# Patient Record
Sex: Male | Born: 1940
Health system: Southern US, Community
[De-identification: ages and names within clinical notes are randomized; demographics above are authoritative.]

## PROBLEM LIST (undated history)

## (undated) DIAGNOSIS — Z950 Presence of cardiac pacemaker: Secondary | ICD-10-CM

## (undated) DIAGNOSIS — I712 Thoracic aortic aneurysm, without rupture, unspecified: Secondary | ICD-10-CM

## (undated) DIAGNOSIS — I10 Essential (primary) hypertension: Secondary | ICD-10-CM

## (undated) DIAGNOSIS — E782 Mixed hyperlipidemia: Secondary | ICD-10-CM

## (undated) DIAGNOSIS — I428 Other cardiomyopathies: Secondary | ICD-10-CM

## (undated) DIAGNOSIS — I4819 Other persistent atrial fibrillation: Secondary | ICD-10-CM

## (undated) DIAGNOSIS — I2510 Atherosclerotic heart disease of native coronary artery without angina pectoris: Secondary | ICD-10-CM

## (undated) DIAGNOSIS — Z95810 Presence of automatic (implantable) cardiac defibrillator: Secondary | ICD-10-CM

## (undated) DIAGNOSIS — C44310 Basal cell carcinoma of skin of unspecified parts of face: Secondary | ICD-10-CM

## (undated) DIAGNOSIS — Q231 Congenital insufficiency of aortic valve: Secondary | ICD-10-CM

## (undated) DIAGNOSIS — I447 Left bundle-branch block, unspecified: Secondary | ICD-10-CM

## (undated) DIAGNOSIS — Q2381 Bicuspid aortic valve: Secondary | ICD-10-CM

## (undated) DIAGNOSIS — N400 Enlarged prostate without lower urinary tract symptoms: Secondary | ICD-10-CM

## (undated) HISTORY — PX: CARDIAC VALVE REPLACEMENT: SHX585

## (undated) HISTORY — PX: HERNIA REPAIR: SHX51

## (undated) HISTORY — DX: Thoracic aortic aneurysm, without rupture, unspecified: I71.20

## (undated) HISTORY — DX: Other cardiomyopathies: I42.8

## (undated) HISTORY — DX: Mixed hyperlipidemia: E78.2

## (undated) HISTORY — DX: Atherosclerotic heart disease of native coronary artery without angina pectoris: I25.10

## (undated) HISTORY — DX: Other persistent atrial fibrillation: I48.19

## (undated) HISTORY — DX: Benign prostatic hyperplasia without lower urinary tract symptoms: N40.0

## (undated) HISTORY — DX: Left bundle-branch block, unspecified: I44.7

## (undated) HISTORY — DX: Essential (primary) hypertension: I10

## (undated) HISTORY — DX: Thoracic aortic aneurysm, without rupture: I71.2

## (undated) HISTORY — DX: Bicuspid aortic valve: Q23.81

## (undated) HISTORY — DX: Congenital insufficiency of aortic valve: Q23.1

---
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
## 2000-02-15 HISTORY — PX: BENTALL PROCEDURE: SHX5058

## 2000-02-15 HISTORY — PX: CARDIAC CATHETERIZATION: SHX172

## 2000-09-06 ENCOUNTER — Encounter: Payer: Self-pay | Admitting: Surgery

## 2000-09-06 ENCOUNTER — Encounter (INDEPENDENT_AMBULATORY_CARE_PROVIDER_SITE_OTHER): Payer: Self-pay | Admitting: *Deleted

## 2000-09-06 ENCOUNTER — Inpatient Hospital Stay (HOSPITAL_COMMUNITY): Admission: RE | Admit: 2000-09-06 | Discharge: 2000-09-18 | Payer: Self-pay | Admitting: Cardiology

## 2000-09-07 ENCOUNTER — Encounter: Payer: Self-pay | Admitting: Surgery

## 2000-09-08 ENCOUNTER — Encounter: Payer: Self-pay | Admitting: Surgery

## 2000-09-09 ENCOUNTER — Encounter: Payer: Self-pay | Admitting: Surgery

## 2000-09-10 ENCOUNTER — Encounter: Payer: Self-pay | Admitting: Surgery

## 2000-09-14 ENCOUNTER — Encounter: Payer: Self-pay | Admitting: Surgery

## 2000-09-14 ENCOUNTER — Encounter: Payer: Self-pay | Admitting: Cardiology

## 2000-10-17 ENCOUNTER — Encounter: Payer: Self-pay | Admitting: Surgery

## 2000-10-17 ENCOUNTER — Encounter: Admission: RE | Admit: 2000-10-17 | Discharge: 2000-10-17 | Payer: Self-pay | Admitting: Surgery

## 2000-10-24 ENCOUNTER — Encounter: Payer: Self-pay | Admitting: Surgery

## 2000-10-24 ENCOUNTER — Encounter: Admission: RE | Admit: 2000-10-24 | Discharge: 2000-10-24 | Payer: Self-pay | Admitting: Surgery

## 2000-10-25 ENCOUNTER — Inpatient Hospital Stay (HOSPITAL_COMMUNITY): Admission: AD | Admit: 2000-10-25 | Discharge: 2000-10-28 | Payer: Self-pay | Admitting: Surgery

## 2000-10-25 ENCOUNTER — Encounter: Payer: Self-pay | Admitting: Surgery

## 2000-10-27 ENCOUNTER — Encounter: Payer: Self-pay | Admitting: Surgery

## 2000-10-28 ENCOUNTER — Encounter: Payer: Self-pay | Admitting: Surgery

## 2000-10-31 ENCOUNTER — Encounter: Payer: Self-pay | Admitting: Surgery

## 2000-10-31 ENCOUNTER — Encounter: Admission: RE | Admit: 2000-10-31 | Discharge: 2000-10-31 | Payer: Self-pay | Admitting: Surgery

## 2000-11-14 ENCOUNTER — Encounter: Payer: Self-pay | Admitting: Surgery

## 2000-11-14 ENCOUNTER — Encounter: Admission: RE | Admit: 2000-11-14 | Discharge: 2000-11-14 | Payer: Self-pay | Admitting: Surgery

## 2003-12-31 ENCOUNTER — Ambulatory Visit: Payer: Self-pay | Admitting: Cardiology

## 2004-02-05 ENCOUNTER — Ambulatory Visit: Payer: Self-pay | Admitting: Cardiology

## 2004-02-18 ENCOUNTER — Ambulatory Visit: Payer: Self-pay | Admitting: Cardiology

## 2004-03-04 ENCOUNTER — Ambulatory Visit: Payer: Self-pay | Admitting: Cardiology

## 2004-04-07 ENCOUNTER — Ambulatory Visit: Payer: Self-pay | Admitting: Cardiology

## 2004-05-05 ENCOUNTER — Ambulatory Visit: Payer: Self-pay | Admitting: Cardiology

## 2004-06-03 ENCOUNTER — Ambulatory Visit: Payer: Self-pay | Admitting: Cardiology

## 2004-07-01 ENCOUNTER — Ambulatory Visit: Payer: Self-pay | Admitting: Cardiology

## 2004-08-02 ENCOUNTER — Ambulatory Visit: Payer: Self-pay | Admitting: Cardiology

## 2004-08-31 ENCOUNTER — Ambulatory Visit: Payer: Self-pay | Admitting: Cardiology

## 2004-09-28 ENCOUNTER — Ambulatory Visit: Payer: Self-pay | Admitting: Cardiology

## 2004-10-07 ENCOUNTER — Ambulatory Visit: Payer: Self-pay | Admitting: Cardiology

## 2004-10-22 ENCOUNTER — Ambulatory Visit: Payer: Self-pay | Admitting: Cardiology

## 2004-11-12 ENCOUNTER — Ambulatory Visit: Payer: Self-pay | Admitting: Cardiology

## 2004-11-19 ENCOUNTER — Ambulatory Visit: Payer: Self-pay | Admitting: Cardiology

## 2004-12-17 ENCOUNTER — Ambulatory Visit: Payer: Self-pay | Admitting: Cardiology

## 2004-12-24 ENCOUNTER — Ambulatory Visit: Payer: Self-pay | Admitting: *Deleted

## 2005-01-12 ENCOUNTER — Ambulatory Visit: Payer: Self-pay | Admitting: Cardiology

## 2005-02-10 ENCOUNTER — Ambulatory Visit: Payer: Self-pay | Admitting: Cardiology

## 2005-03-10 ENCOUNTER — Ambulatory Visit: Payer: Self-pay | Admitting: Cardiology

## 2005-04-01 ENCOUNTER — Ambulatory Visit: Payer: Self-pay | Admitting: Cardiology

## 2005-04-27 ENCOUNTER — Ambulatory Visit: Payer: Self-pay | Admitting: Cardiology

## 2005-05-25 ENCOUNTER — Ambulatory Visit: Payer: Self-pay | Admitting: Cardiology

## 2005-06-22 ENCOUNTER — Ambulatory Visit: Payer: Self-pay | Admitting: Cardiology

## 2005-06-29 ENCOUNTER — Ambulatory Visit: Payer: Self-pay | Admitting: Cardiology

## 2005-07-13 ENCOUNTER — Ambulatory Visit: Payer: Self-pay | Admitting: Cardiology

## 2005-08-10 ENCOUNTER — Ambulatory Visit: Payer: Self-pay | Admitting: Cardiology

## 2005-08-30 ENCOUNTER — Ambulatory Visit: Payer: Self-pay | Admitting: Cardiology

## 2005-09-27 ENCOUNTER — Ambulatory Visit: Payer: Self-pay | Admitting: Cardiology

## 2005-10-25 ENCOUNTER — Ambulatory Visit: Payer: Self-pay | Admitting: Cardiology

## 2005-11-15 ENCOUNTER — Ambulatory Visit: Payer: Self-pay | Admitting: Cardiology

## 2005-12-26 ENCOUNTER — Ambulatory Visit: Payer: Self-pay | Admitting: Cardiology

## 2006-01-02 ENCOUNTER — Ambulatory Visit: Payer: Self-pay | Admitting: Cardiology

## 2006-01-12 ENCOUNTER — Ambulatory Visit: Payer: Self-pay | Admitting: Cardiology

## 2006-01-26 ENCOUNTER — Ambulatory Visit: Payer: Self-pay | Admitting: Cardiology

## 2006-02-16 ENCOUNTER — Ambulatory Visit: Payer: Self-pay | Admitting: Cardiology

## 2006-03-07 ENCOUNTER — Ambulatory Visit: Payer: Self-pay | Admitting: Cardiology

## 2006-03-28 ENCOUNTER — Ambulatory Visit: Payer: Self-pay | Admitting: Cardiology

## 2006-05-02 ENCOUNTER — Ambulatory Visit: Payer: Self-pay | Admitting: Cardiology

## 2006-06-14 ENCOUNTER — Ambulatory Visit: Payer: Self-pay | Admitting: Cardiology

## 2006-07-12 ENCOUNTER — Ambulatory Visit: Payer: Self-pay | Admitting: Physician Assistant

## 2006-08-09 ENCOUNTER — Ambulatory Visit: Payer: Self-pay | Admitting: Cardiology

## 2006-09-07 ENCOUNTER — Ambulatory Visit: Payer: Self-pay | Admitting: Cardiology

## 2006-09-21 ENCOUNTER — Ambulatory Visit: Payer: Self-pay | Admitting: Cardiology

## 2006-09-29 ENCOUNTER — Encounter: Payer: Self-pay | Admitting: Cardiology

## 2006-09-29 ENCOUNTER — Ambulatory Visit: Payer: Self-pay | Admitting: Cardiology

## 2006-10-05 ENCOUNTER — Ambulatory Visit: Payer: Self-pay | Admitting: Cardiology

## 2006-10-25 ENCOUNTER — Ambulatory Visit: Payer: Self-pay | Admitting: Cardiology

## 2006-10-25 ENCOUNTER — Encounter: Payer: Self-pay | Admitting: Cardiology

## 2006-11-02 ENCOUNTER — Ambulatory Visit: Payer: Self-pay | Admitting: Cardiology

## 2006-12-07 ENCOUNTER — Ambulatory Visit: Payer: Self-pay | Admitting: Cardiology

## 2007-01-04 ENCOUNTER — Ambulatory Visit: Payer: Self-pay | Admitting: Cardiology

## 2007-01-08 ENCOUNTER — Encounter: Payer: Self-pay | Admitting: Cardiology

## 2007-01-08 ENCOUNTER — Ambulatory Visit: Payer: Self-pay | Admitting: Cardiology

## 2007-01-10 ENCOUNTER — Ambulatory Visit: Payer: Self-pay | Admitting: Cardiology

## 2007-01-16 ENCOUNTER — Ambulatory Visit: Payer: Self-pay | Admitting: Cardiology

## 2007-01-18 ENCOUNTER — Ambulatory Visit: Payer: Self-pay | Admitting: Cardiology

## 2007-01-30 ENCOUNTER — Ambulatory Visit: Payer: Self-pay | Admitting: Cardiology

## 2007-02-20 ENCOUNTER — Ambulatory Visit: Payer: Self-pay | Admitting: Cardiology

## 2007-03-20 ENCOUNTER — Ambulatory Visit: Payer: Self-pay | Admitting: Cardiology

## 2007-04-25 ENCOUNTER — Ambulatory Visit: Payer: Self-pay | Admitting: Cardiology

## 2007-05-01 ENCOUNTER — Encounter: Payer: Self-pay | Admitting: Cardiology

## 2007-05-23 ENCOUNTER — Ambulatory Visit: Payer: Self-pay | Admitting: Cardiology

## 2007-06-20 ENCOUNTER — Ambulatory Visit: Payer: Self-pay | Admitting: Cardiology

## 2007-07-19 ENCOUNTER — Ambulatory Visit: Payer: Self-pay | Admitting: Cardiology

## 2007-08-15 ENCOUNTER — Ambulatory Visit: Payer: Self-pay | Admitting: Cardiology

## 2007-09-04 ENCOUNTER — Ambulatory Visit: Payer: Self-pay | Admitting: Cardiology

## 2007-09-12 ENCOUNTER — Ambulatory Visit: Payer: Self-pay | Admitting: Cardiology

## 2007-10-08 ENCOUNTER — Ambulatory Visit: Payer: Self-pay | Admitting: Cardiology

## 2007-11-20 ENCOUNTER — Ambulatory Visit: Payer: Self-pay | Admitting: Cardiology

## 2007-12-04 ENCOUNTER — Ambulatory Visit: Payer: Self-pay | Admitting: Cardiology

## 2007-12-25 ENCOUNTER — Ambulatory Visit: Payer: Self-pay | Admitting: Cardiology

## 2008-01-04 ENCOUNTER — Ambulatory Visit: Payer: Self-pay | Admitting: Cardiology

## 2008-01-08 ENCOUNTER — Encounter: Payer: Self-pay | Admitting: Cardiology

## 2008-01-25 ENCOUNTER — Ambulatory Visit: Payer: Self-pay | Admitting: Cardiology

## 2008-02-22 ENCOUNTER — Ambulatory Visit: Payer: Self-pay | Admitting: Cardiology

## 2008-03-26 ENCOUNTER — Ambulatory Visit: Payer: Self-pay | Admitting: Cardiology

## 2008-04-02 ENCOUNTER — Ambulatory Visit: Payer: Self-pay | Admitting: Cardiology

## 2008-04-02 DIAGNOSIS — E782 Mixed hyperlipidemia: Secondary | ICD-10-CM | POA: Insufficient documentation

## 2008-04-02 DIAGNOSIS — I4891 Unspecified atrial fibrillation: Secondary | ICD-10-CM | POA: Insufficient documentation

## 2008-04-04 ENCOUNTER — Encounter: Payer: Self-pay | Admitting: Cardiology

## 2008-04-22 ENCOUNTER — Ambulatory Visit: Payer: Self-pay | Admitting: Cardiology

## 2008-05-13 ENCOUNTER — Ambulatory Visit: Payer: Self-pay | Admitting: Cardiology

## 2008-06-10 ENCOUNTER — Ambulatory Visit: Payer: Self-pay | Admitting: Cardiology

## 2008-07-08 ENCOUNTER — Ambulatory Visit: Payer: Self-pay | Admitting: Cardiology

## 2008-07-11 ENCOUNTER — Encounter: Payer: Self-pay | Admitting: Cardiology

## 2008-08-12 ENCOUNTER — Ambulatory Visit: Payer: Self-pay | Admitting: Cardiology

## 2008-09-09 ENCOUNTER — Ambulatory Visit: Payer: Self-pay | Admitting: Cardiology

## 2008-09-10 ENCOUNTER — Telehealth: Payer: Self-pay | Admitting: Cardiology

## 2008-09-29 ENCOUNTER — Encounter: Payer: Self-pay | Admitting: *Deleted

## 2008-10-03 ENCOUNTER — Encounter (INDEPENDENT_AMBULATORY_CARE_PROVIDER_SITE_OTHER): Payer: Self-pay | Admitting: *Deleted

## 2008-10-07 ENCOUNTER — Ambulatory Visit: Payer: Self-pay | Admitting: Cardiology

## 2008-10-07 LAB — CONVERTED CEMR LAB
POC INR: 2.8
Prothrombin Time: 20.4 s

## 2008-10-08 ENCOUNTER — Ambulatory Visit: Payer: Self-pay | Admitting: Cardiology

## 2008-10-08 DIAGNOSIS — I10 Essential (primary) hypertension: Secondary | ICD-10-CM

## 2008-10-08 DIAGNOSIS — R002 Palpitations: Secondary | ICD-10-CM

## 2008-10-10 ENCOUNTER — Encounter: Payer: Self-pay | Admitting: Cardiology

## 2008-10-16 ENCOUNTER — Encounter: Payer: Self-pay | Admitting: Physician Assistant

## 2008-10-17 ENCOUNTER — Ambulatory Visit: Payer: Self-pay | Admitting: Cardiology

## 2008-10-17 ENCOUNTER — Encounter: Payer: Self-pay | Admitting: Cardiology

## 2008-11-04 ENCOUNTER — Ambulatory Visit: Payer: Self-pay | Admitting: Cardiology

## 2008-11-04 LAB — CONVERTED CEMR LAB: POC INR: 4.8

## 2008-11-05 ENCOUNTER — Encounter: Payer: Self-pay | Admitting: Cardiology

## 2008-11-12 ENCOUNTER — Encounter (INDEPENDENT_AMBULATORY_CARE_PROVIDER_SITE_OTHER): Payer: Self-pay | Admitting: *Deleted

## 2008-11-28 ENCOUNTER — Encounter: Payer: Self-pay | Admitting: Cardiology

## 2009-01-02 ENCOUNTER — Ambulatory Visit: Payer: Self-pay | Admitting: Cardiology

## 2009-01-02 DIAGNOSIS — I428 Other cardiomyopathies: Secondary | ICD-10-CM

## 2009-01-02 DIAGNOSIS — I2510 Atherosclerotic heart disease of native coronary artery without angina pectoris: Secondary | ICD-10-CM

## 2009-01-14 ENCOUNTER — Ambulatory Visit: Payer: Self-pay | Admitting: Cardiology

## 2009-01-23 ENCOUNTER — Ambulatory Visit: Payer: Self-pay | Admitting: Internal Medicine

## 2009-03-05 ENCOUNTER — Ambulatory Visit: Payer: Self-pay | Admitting: Cardiology

## 2009-06-17 ENCOUNTER — Encounter: Payer: Self-pay | Admitting: Cardiology

## 2009-06-17 ENCOUNTER — Telehealth: Payer: Self-pay | Admitting: Cardiology

## 2009-07-08 ENCOUNTER — Ambulatory Visit: Payer: Self-pay | Admitting: Internal Medicine

## 2009-07-14 ENCOUNTER — Telehealth (INDEPENDENT_AMBULATORY_CARE_PROVIDER_SITE_OTHER): Payer: Self-pay | Admitting: *Deleted

## 2009-07-21 ENCOUNTER — Telehealth (INDEPENDENT_AMBULATORY_CARE_PROVIDER_SITE_OTHER): Payer: Self-pay | Admitting: *Deleted

## 2009-09-14 ENCOUNTER — Ambulatory Visit: Payer: Self-pay | Admitting: Cardiology

## 2010-01-01 ENCOUNTER — Telehealth: Payer: Self-pay | Admitting: Cardiology

## 2010-03-14 LAB — CONVERTED CEMR LAB
ALT: 27 units/L (ref 0–53)
AST: 36 units/L (ref 0–37)
Albumin: 4.3 g/dL (ref 3.5–5.2)
Alkaline Phosphatase: 57 units/L (ref 39–117)
Bilirubin, Direct: 0.2 mg/dL (ref 0.0–0.3)
TSH: 1.89 microintl units/mL (ref 0.35–5.50)
Total Bilirubin: 0.8 mg/dL (ref 0.3–1.2)
Total Protein: 6.5 g/dL (ref 6.0–8.3)

## 2010-03-16 NOTE — Letter (Signed)
Summary: Letter/ CLEARANCE TO STOP COUMADIN [facility]  Letter/ CLEARANCE TO STOP COUMADIN [facility]   Imported By: Dorise Hiss 06/18/2009 08:48:40  _____________________________________________________________________  External Attachment:    Type:   Image     Comment:   External Document

## 2010-03-16 NOTE — Progress Notes (Signed)
Summary: Coumadin for Prostate Biopsy  Phone Note Call from Patient Call back at Home Phone [phone] Call back at [phone]   Summary of Call: Pt called stating he is having a prostate biopsy on 12/19. He would like to know if he can hold his Coumadin for 5 days prior to biopsy. He states surgeon's staff told him that he should call for clearance regarding Coumadin. Pt's Coumadin is managed by his primary MD.  Initial call taken by: Cyril Loosen, RN, BSN,  January 01, 2010 10:10 AM  Follow-up for Phone Call        Patient held Coumadin prior to a previous prostate biopsy back in May of this year. No other bridging was pursued at that time. He did in fact have some hematuria after the biopsy, delaying resumption of Coumadin. If he needs to undergo another prostate biopsy, then would hold Coumadin 4-5 days prior to proceeding without bridging Lovenox. Dr. Sherryll Burger has actually been following PT/INR levels. Would also make him aware of these recommendations so that he can arrange appropriate followup when Coumadin was resumed. Follow-up by: Loreli Slot, MD, [facility],  January 05, 2010 9:39 AM     Appended Document: Coumadin for Prostate Biopsy Pt notified and verbalized understanding. Copy of this note will be faxed to Dr. Gaynelle Arabian at [facility] [place]. Their phone number is [phone]. Fax is [phone]. Also a copy will be faxed to Dr. Sherryll Burger.

## 2010-03-16 NOTE — Assessment & Plan Note (Signed)
Summary: 6 mo fu july reminder   Visit Type:  Follow-up Primary Provider:  Dr. [name]   History of Present Illness: 70 year old male presents for followup. He reports continued problems with palpitations. He is not entirely clear about his amiodarone dose, however it seems to be 100 mg daily as best I can tell. Other medicines are outlined below.  He saw Dr. Johney Frame back in May for discussion of therapeutic options for management of atrial fibrillation. Amiodarone was continued at that time. Followup labs from 25 May showed TSH 1.8, AST 36, ALT 27.  No chest pain or unusual breathlessness. No cough or hemoptysis.  Electrocardiogram is reviewed below.  Preventive Screening-Counseling & Management  Alcohol-Tobacco     Smoking Status: quit     Year Quit: 2003  Current Medications (verified): 1)  Amiodarone Hcl 200 Mg Tabs (Amiodarone Hcl) .... Take 1/2 Tablet By Mouth Once Daily. 2)  Lipitor 20 Mg Tabs (Atorvastatin Calcium) .... Take 1 Tablet By Mouth At Bedtime 3)  Toprol Xl 50 Mg Xr24h-Tab (Metoprolol Succinate) .... Take 1 1/2 Tablet By Mouth Twice A Day 4)  Warfarin Sodium 5 Mg Tabs (Warfarin Sodium) .... Take 1 Tablet By Mouth As Directed 5)  Multivitamins   Tabs (Multiple Vitamin) .... Once Daily 6)  Fish Oil 1000 Mg Caps (Omega-3 Fatty Acids) .... Take 1 Tablet By Mouth Two Times A Day 7)  Flomax 0.4 Mg Caps (Tamsulosin Hcl) .Marland Kitchen.. 1 Capsule Once Daily  Allergies (verified): No Known Drug Allergies  Comments:  Nurse/Medical Assistant: The patient's medications and allergies were verbally reviewed with the patient and were updated in the Medication and Allergy Lists.  Past History:  Past Medical History: Last updated: 07/08/2009 Paroyxsmal Atrial Fibrillation and atrial flutter CAD, nonobstructive by cardiac catheterization in 2002. Nonischemic CM (EF 45%) Hyperlipidemia Hypertension Left bundle-branch block BPH Bicuspid aortic valve with fusiform ascending  aortic aneurysm, status post Bentall procedure 2002  Past Surgical History: Last updated: 04/02/2008 Bentall procedure 2002 Tonsillectomy  Social History: Last updated: 01/23/2009 Widowed.  Lives in [place], [place].  Nutritional therapist. Tobacco Use - Former.  Quit 2002 Alcohol Use - rare Regular Exercise - yes  Review of Systems  The patient denies chest pain, syncope, dyspnea on exertion, peripheral edema, prolonged cough, melena, and hematochezia.         Otherwise reviewed and negative.  Vital Signs:  Patient profile:   70 year old male Height:      68 inches Weight:      188 pounds Pulse rate:   84 / minute BP sitting:   151 / 82  (left arm) Cuff size:   regular  Vitals Entered By: Carlye Grippe (September 14, 2009 3:06 PM)  Serial Vital Signs/Assessments:  Time      Position  BP       Pulse  Resp  Temp     By 3:10 PM             143/76   85                    Carlye Grippe  Comments: 3:10 PM recheck left arm/ reg cuff By: Carlye Grippe     Physical Exam  Additional Exam:  Overweight male in no acute distress. HEENT: Conjunctiva and lids normal, oropharynx clear. Neck: Supple, no carotid bruits, no thyromegaly. Lungs: Clear to auscultation, nonlabored. Cardiac: irregularly irregular, crisp prosthetic mechanical aortic valve click, no diastolic murmur, no S3 gallop. Abdomen: Soft,  nontender, no hepatomegaly, no bruits. Extremities: No significant pitting edema, distal pulses are 2+. Skin: Warm and dry. Musculoskeletal: No kyphosis. Neuropsychiatric: Alert oriented x3, appropriate.   CXR  Procedure date:  10/17/2008  Findings:      Cardiac size within normal limits. Sternal wires noted. Valvular prosthesis noted. Mild redistribution of pulmonary blood flow to upper lung zones.  PFT  Procedure date:  10/17/2008  Findings:      FVC 79% predicted. FEV1 93% predicted. FEV1/FVC 81% predicted. DLCO 80% predicted.  Echocardiogram  Procedure date:   10/17/2008  Findings:      LVEF 40-45%, paradoxical septal motion, mild to moderate left atrial enlargement, mild right atrial enlargement, mechanical aortic prosthesis, mean aortic valve gradient 14 mm mercury, mild to moderate mitral regurgitation, trace tricuspid regurgitation, RVSP 39 mm mercury.  Nuclear Study  Procedure date:  01/14/2009  Findings:      1. Lexiscan bolus per protocol. The patient experienced no chest pain. Nondiagnostic secondary to LBBB. Normal blood pressure response to stress. 2. Abnormal LV perfusion. Limitations and artifact were due to diaphragm or other soft tissue. There was a small, fixed defect in the mid anterior, mid anteroseptal segment(s). The degree of photon reduction was mild. There was a small, fixed defect in the basal inferior, mid inferoseptal segment(s). The degree of photon reduction was mild. No clearly reversible defects to suggest ischemia. Global left ventricular systolic function was moderately reduced. The patient's calculated post stress LVEF was 40%. The patient's end diastolic volume was . TID Ratio 1.  EKG  Procedure date:  09/14/2009  Findings:      Atrial fibrillation at 80 beats per minute with left bundle branch block pattern.  Impression & Recommendations:  Problem # 1:  ATRIAL FIBRILLATION (ICD-427.31)  Paroxysmal, persists today. Amiodarone will be increased to 200 mg daily. Otherwise continue present medications. May need to back down on Toprol-XL if bradycardia becomes more problematic. Dr. Jenel Lucks note was reviewed regarding other potential options for management of his dysrhythmia.  His updated medication list for this problem includes:    Amiodarone Hcl 200 Mg Tabs (Amiodarone hcl) .Marland Kitchen... Take 1 tablet by mouth once a day    Toprol Xl 50 Mg Xr24h-tab (Metoprolol succinate) .Marland Kitchen... Take 1 1/2 tablet by mouth twice a day    Warfarin Sodium 5 Mg Tabs (Warfarin sodium) .Marland Kitchen... Take 1 tablet by mouth as  directed  Orders: EKG w/ Interpretation (93000)  Problem # 2:  UNSPECIFIED SECONDARY CARDIOMYOPATHY (ICD-425.9)  Nonischemic cardiomyopathy, LVEF approximately 45%. No active heart failure symptoms.  His updated medication list for this problem includes:    Amiodarone Hcl 200 Mg Tabs (Amiodarone hcl) .Marland Kitchen... Take 1 tablet by mouth once a day    Toprol Xl 50 Mg Xr24h-tab (Metoprolol succinate) .Marland Kitchen... Take 1 1/2 tablet by mouth twice a day    Warfarin Sodium 5 Mg Tabs (Warfarin sodium) .Marland Kitchen... Take 1 tablet by mouth as directed  Problem # 3:  CORONARY ATHEROSCLEROSIS NATIVE CORONARY ARTERY (ICD-414.01)  Nonobstructive at catheterization in 2002 with no definite ischemia by followup Cardiolite in December 2010.  His updated medication list for this problem includes:    Toprol Xl 50 Mg Xr24h-tab (Metoprolol succinate) .Marland Kitchen... Take 1 1/2 tablet by mouth twice a day    Warfarin Sodium 5 Mg Tabs (Warfarin sodium) .Marland Kitchen... Take 1 tablet by mouth as directed  Orders: EKG w/ Interpretation (93000)  Patient Instructions: 1)  Your physician wants you to follow-up in: 6 months. You will  receive a reminder letter in the mail one-two months in advance. If you don't receive a letter, please call our office to schedule the follow-up appointment. 2)  Take Amiodarone 200mg  by mouth once daily. Prescriptions: AMIODARONE HCL 200 MG TABS (AMIODARONE HCL) Take 1 tablet by mouth once a day  #90 x 3   Entered by:   Cyril Loosen, RN, BSN   Authorized by:   Loreli Slot, MD, [facility]   Signed by:   Cyril Loosen, RN, BSN on 09/14/2009   Method used:   Electronically to        [facility] (retail)       [address] [place], [place]  [address]       Ph: [phone]       Fax: [phone]   RxID:   [id number]

## 2010-03-16 NOTE — Progress Notes (Signed)
Summary: Questions regarding post-op bleeding and restarting coumadin  Phone Note Call from Patient Call back at Home Phone [phone] Call back at [phone]   Summary of Call: Pt left message to call stating he has something going on that he feels Dr. Diona Browner would want to know about because it could be life threatening.   Pt states he had biopsy on prostate last Friday and was told he would have some bleeding in his urine afterwards. He was off coumadin for 5 days before biopsy. He stopped this on May 28th. He decided to take Coumadin on June 5th (Sunday) because he was concerned that he had been off of this for so long. Dr. Earlene Plater did not tell him when to restart Coumadin following surgery. He states he started bleeding more after taking 1 5mg  tablet on Sunday. He saw Dr. Earlene Plater yesterday and was told the bleeding is normal. Pt wants to know what could happen to him while he's off of Coumadin. He states his heart feels like it is beating irratic. He wants to know if there is something that can stop the bleeding sooner. He also has not been told when to restart the Coumadin. Pt's coumadin is managed by Dr. Sherryll Burger.   Pt notified that Dr. Earlene Plater needs to weigh the risk of bleeding with the risk of being off of the Coumadin and make a decision when the pt should resume Coumadin. Pt asked several times when he should restart Coumadin. Pt notified that Dr. Earlene Plater preformed his biopsy and is treating his post-op bleeding, therefore, he must make the decision when the pt should restart his Coumadin. Pt notified to we would like Coumadin restarted ASAP, but again, this will ultimately be Dr. Jinny Sanders decision since he is treating his bleeding. Notified pt to contact Dr. Earlene Plater to ask specifically when he should restart his coumadin. Pt notified he should see Dr. Sherryll Burger 3-4 days after restarting the Coumadin to for PT/INR check. Pt verbalized understanding.  Initial call taken by: Cyril Loosen, RN, BSN,  July 21, 2009 10:00 AM  Follow-up for Phone Call        Reviewed.  He should stay in close contact with Dr. Earlene Plater regarding the bleeding and appropriate timing for resuming Coumadin.  Once Coumadin is resumed, he should have a PT/INR in 3 days with Dr.Shah. Follow-up by: Loreli Slot, MD, [facility],  July 21, 2009 11:31 AM  Additional Follow-up for Phone Call Additional follow up Details #1::        Pt notified that Dr. Diona Browner had reviewed the nurse's note and agreed with recommendation to follow with Dr. Earlene Plater and have PT/INR check with Dr. Sherryll Burger following restart of coumadin. Additional Follow-up by: Cyril Loosen, RN, BSN,  July 21, 2009 2:24 PM

## 2010-03-16 NOTE — Progress Notes (Signed)
       Additional Follow-up for Phone Call Additional follow up Details #2::    Received communication from Rema Fendt at [facility] regarding request for limited cessation of Coumadin (5 days) for a prostate biopsy secondary to elevated PSA. Patient has paroxysmal atrial fibrillation and flutter - last in sinus rhythm at office visit earlier this year. He should be able to hold Coumadin for 5 days as requested without clear need for Lovenox bridge (may only serve to increase his bleeding risk).  Should start back on Coumadin after biopsy with plan to have PT/INR checked 4 days after start. Follow-up by: Loreli Slot, MD, [facility],  Jun 17, 2009 4:22 PM   Appended Document:  Faxed to [facility]

## 2010-03-16 NOTE — Assessment & Plan Note (Signed)
Summary: 2 MO FU REMINDER   Visit Type:  Follow-up Primary Provider:  Dr. [name]   History of Present Illness: 70 year old male presents for a followup visit. He saw Dr. Johney Frame in electrophysiology consultation as scheduled. They discussed a number of options, and at this point elected to continue the same course, including amiodarone. He remains concerned about staying on this medicine long-term, and we discussed reducing the dose to 100 mg daily, realizing that he may have less optimal control of breakthrough arrhythmias, and we may need to consider switching him to a different antiarrhythmic altogether.  Patient has had less palpitations in general, no chest pain, no progressive shortness of breath, and continues to exercise regularly. He has had no bleeding problems.  Current Medications (verified): 1)  Amiodarone Hcl 200 Mg Tabs (Amiodarone Hcl) .... Take 1/2 Tablet By Mouth Once Daily. 2)  Lipitor 20 Mg Tabs (Atorvastatin Calcium) .... Take 1 Tablet By Mouth At Bedtime 3)  Toprol Xl 50 Mg Xr24h-Tab (Metoprolol Succinate) .... Take 1 1/2 Tablet By Mouth Twice A Day 4)  Warfarin Sodium 5 Mg Tabs (Warfarin Sodium) .... Take 1 Tablet By Mouth As Directed 5)  Multivitamins   Tabs (Multiple Vitamin) .... Once Daily 6)  Fish Oil 1000 Mg Caps (Omega-3 Fatty Acids) .... Take 1 Tablet By Mouth Two Times A Day  Allergies (verified): No Known Drug Allergies  Comments:  Nurse/Medical Assistant: The patient's medications and allergies were reviewed with the patient and were updated in the Medication and Allergy Lists. Verbally gave names.  Past History:  Past Surgical History: Last updated: 04/02/2008 Bentall procedure 2002 Tonsillectomy  Social History: Last updated: 01/23/2009 Widowed.  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 Hyperlipidemia Hypertension Left bundle-branch block BPH Bicuspid aortic valve with fusiform ascending aortic aneurysm, status post Bentall procedure 2002  Review of Systems  The patient denies anorexia, fever, weight loss, chest pain, syncope, peripheral edema, prolonged cough, hemoptysis, melena, and hematochezia.         Otherwise reviewed and negative.  Vital Signs:  Patient profile:   70 year old male Height:      68 inches Weight:      194 pounds Pulse rate:   52 / minute BP sitting:   138 / 73  (left arm) Cuff size:   regular  Vitals Entered By: Carlye Grippe (March 05, 2009 1:45 PM)  Physical Exam  Additional Exam:  Overweight male in no acute distress. HEENT: Conjunctiva and lids normal, oropharynx clear. Neck: Supple, no carotid bruits, no thyromegaly. Lungs: Clear to auscultation, nonlabored. Cardiac: Regular rate and rhythm, crisp prosthetic mechanical aortic valve click, no diastolic murmur, no S3 gallop. Abdomen: Soft, nontender, no hepatomegaly, no bruits. Extremities: No significant pitting edema, distal pulses are 2+. Skin: Warm and dry. Musculoskeletal: No kyphosis. Neuropsychiatric: Alert oriented x3, appropriate.   EKG  Procedure date:  03/05/2009  Findings:      Sinus bradycardia at 51 beats per minutes with left bundle branch block.  Impression & Recommendations:  Problem # 1:  ATRIAL FIBRILLATION (ICD-427.31)  Patient in sinus rhythm today, with less frequent palpitations in general. Plan will be to reduce amiodarone from 200 mg daily to 100 mg daily, and have him follow up with the next 6 months with repeat liver function tests and  TSH. If he has progressive breakthrough atrial dysrhythmias, we will need to consider a different antiarrhythmic. He does have somewhat of an element of tachycardia-bradycardia syndrome, and device therapy is also a potential option down the  road.  His updated medication list for this problem includes:    Amiodarone Hcl 200 Mg Tabs (Amiodarone hcl) .Marland Kitchen... Take 1/2 tablet by mouth once daily.    Toprol Xl 50 Mg Xr24h-tab (Metoprolol succinate) .Marland Kitchen... Take 1 1/2 tablet by mouth twice a day    Warfarin Sodium 5 Mg Tabs (Warfarin sodium) .Marland Kitchen... Take 1 tablet by mouth as directed  Orders: EKG w/ Interpretation (93000)  Problem # 2:  UNSPECIFIED SECONDARY CARDIOMYOPATHY (ICD-425.9)  Symptomatically stable on present medical regimen.  His updated medication list for this problem includes:    Amiodarone Hcl 200 Mg Tabs (Amiodarone hcl) .Marland Kitchen... Take 1/2 tablet by mouth once daily.    Toprol Xl 50 Mg Xr24h-tab (Metoprolol succinate) .Marland Kitchen... Take 1 1/2 tablet by mouth twice a day    Warfarin Sodium 5 Mg Tabs (Warfarin sodium) .Marland Kitchen... Take 1 tablet by mouth as directed  Problem # 3:  MIXED HYPERLIPIDEMIA (ICD-272.2)  Plan lipid profile prior to next visit.  His updated medication list for this problem includes:    Lipitor 20 Mg Tabs (Atorvastatin calcium) .Marland Kitchen... Take 1 tablet by mouth at bedtime  Patient Instructions: 1)  Decrease Amiodarone to 100mg  by mouth once daily. This is 1/2 of your 200mg  tablets.  2)  Your physician recommends that you go to the [facility] for a FASTING lipid profile, TSH, and liver function labs:  before your next appt in 6 months. Do not eat or drink after midnight.  3)  Your physician wants you to follow-up in: 6 months. You will receive a reminder letter in the mail one-two months in advance. If you don't receive a letter, please call our office to schedule the follow-up appointment.

## 2010-03-16 NOTE — Assessment & Plan Note (Signed)
Summary: rov/jss   Visit Type:  Follow-up Referring Provider:  Simona Huh, MD Primary Provider:  Dr. [name]   History of Present Illness: The patient presents today for routine electrophysiology followup. He reports doing very well since last being seen in our clinic.  He is now walking  ~ 1 mile most days.  He feels that his energy and exercise tolerance are stable.  He reports occasional episodes of afib.  He tried decreasing amiodarone to 100mg  daily but noticed increasing afib with this dose.  The patient denies symptoms of chest pain, shortness of breath, orthopnea, PND, lower extremity edema, dizziness, presyncope, syncope, or neurologic sequela. The patient is tolerating medications without difficulties and is otherwise without complaint today.   Current Medications (verified): 1)  Amiodarone Hcl 200 Mg Tabs (Amiodarone Hcl) .... Take 1 Tablet By Mouth Once Daily. 2)  Lipitor 20 Mg Tabs (Atorvastatin Calcium) .... Take 1 Tablet By Mouth At Bedtime 3)  Toprol Xl 50 Mg Xr24h-Tab (Metoprolol Succinate) .... Take 1 1/2 Tablet By Mouth Twice A Day 4)  Warfarin Sodium 5 Mg Tabs (Warfarin Sodium) .... Take 1 Tablet By Mouth As Directed 5)  Multivitamins   Tabs (Multiple Vitamin) .... Once Daily 6)  Fish Oil 1000 Mg Caps (Omega-3 Fatty Acids) .... Take 1 Tablet By Mouth Two Times A Day 7)  Flomax 0.4 Mg Caps (Tamsulosin Hcl) .Marland Kitchen.. 1 Capsule Once Daily  Allergies (verified): No Known Drug Allergies  Past History:  Past Medical History: Paroyxsmal Atrial Fibrillation and atrial flutter CAD, nonobstructive by cardiac catheterization in 2002. Nonischemic CM (EF 45%) Hyperlipidemia Hypertension Left bundle-branch block BPH Bicuspid aortic valve with fusiform ascending aortic aneurysm, status post Bentall procedure 2002  Past Surgical History: Reviewed history from 04/02/2008 and no changes required. Bentall procedure 2002 Tonsillectomy  Social History: Reviewed history from  01/23/2009 and no changes required. Widowed.  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   Vital Signs:  Patient profile:   70 year old male Height:      68 inches Weight:      193 pounds BMI:     29.45 Pulse rate:   50 / minute BP sitting:   110 / 80  (left arm)  Vitals Entered By: Laurance Flatten CMA (Jul 08, 2009 3:09 PM)  Physical Exam  General:  Well developed, well nourished, in no acute distress. Head:  normocephalic and atraumatic Nose:  no deformity, discharge, inflammation, or lesions Mouth:  Teeth, gums and palate normal. Oral mucosa normal. Neck:  Neck supple, no JVD. No masses, thyromegaly or abnormal cervical nodes. Lungs:  Clear bilaterally to auscultation and percussion. Heart:  RRR, mechanical A2 Abdomen:  Bowel sounds positive; abdomen soft and non-tender without masses, organomegaly, or hernias noted. No hepatosplenomegaly. Msk:  Back normal, normal gait. Muscle strength and tone normal. Pulses:  pulses normal in all 4 extremities Extremities:  No clubbing or cyanosis. Neurologic:  Alert and oriented x 3. Skin:  Intact without lesions or rashes. Cervical Nodes:  no significant adenopathy Psych:  Normal affect.   EKG  Procedure date:  07/08/2009  Findings:      sinus bradycardia 50 bpm, PR 164, QTc 550, LBBB  Impression & Recommendations:  Problem # 1:  ATRIAL FIBRILLATION (ICD-427.31) stable, controlled on amiodarone  200mg  daily taking coumadin for valvular afib  Therapeutic strategies for afib including medicine and ablation were discussed in detail with the patient today. He is aware that anticipated success rates from catheter ablation are 50-60% given structural heart disease.  Our medical options are limited by bradycardia and prolonged QT.  Presently, amiodarone is his only  antiarrhythmic option.  Pt will have LFTs, TFTs, and PFTs followed by Dr Diona Browner. If he has progressive symptoms of bradycardia, then we could consider biventricular pacemaker implantation at that time.  If amiodarone becomes ineffective, then we will consider BiV pacemaker with av nodal ablation. After full discussion, pt wishes to continue his current medicines at this time.  Problem # 2:  CORONARY ATHEROSCLEROSIS NATIVE CORONARY ARTERY (ICD-414.01) stable  Problem # 3:  ESSENTIAL HYPERTENSION, BENIGN (ICD-401.1) stable  Other Orders: TLB-TSH (Thyroid Stimulating Hormone) (84443-TSH) TLB-Hepatic/Liver Function Pnl (80076-HEPATIC)  Patient Instructions: 1)  Your physician recommends that you return for lab work today 2)  Your physician recommends that you schedule a follow-up appointment with Dr Diona Browner

## 2010-03-16 NOTE — Progress Notes (Signed)
Summary: Concerns regarding Coumadin  Phone Note Call from Patient Call back at [phone]   Caller: Patient Summary of Call: Patient states that he needs to speak w/Dr.McDowell, that it is a life or death situation. When asked what the issue was, he stated that he has been off of Coumadin for 3 days per [facility] for biopsy to be done on Friday July 17, 2009.  He states that he is scared because he has never been off of Coumadin for this long.  Initial call taken by: Raechel Ache [facility],  Jul 14, 2009 1:40 PM  Follow-up for Phone Call        Patient just saw Dr. Johney Frame on 5/25 and was in sinus rhythm at that time (has h/o PAF/flutter).  Sounds like he needs prostate biopsy and therefore Coumadin held.  This is reasonable - his risk of bleeding would be too high to do prostate biopsy on Coumadin.  Not clear that he needs to be on bridging Lovenox, particuarly since in sinus rhythm now.  Patient followed in [facility] office - will forward and ask for nursing to speak with him today. Follow-up by: Loreli Slot, MD, [facility],  Jul 14, 2009 2:12 PM  Additional Follow-up for Phone Call Additional follow up Details #1::        Left message to call back on machine. Cyril Loosen, RN, BSN  Jul 14, 2009 2:59 PM  Pt notified and verbalized understanding. Additional Follow-up by: Cyril Loosen, RN, BSN,  Jul 14, 2009 4:06 PM

## 2010-04-23 ENCOUNTER — Encounter: Payer: Self-pay | Admitting: Cardiology

## 2010-04-28 ENCOUNTER — Encounter: Payer: Self-pay | Admitting: *Deleted

## 2010-05-03 ENCOUNTER — Encounter: Payer: Self-pay | Admitting: *Deleted

## 2010-05-10 ENCOUNTER — Encounter: Payer: Self-pay | Admitting: Cardiology

## 2010-05-11 ENCOUNTER — Ambulatory Visit (INDEPENDENT_AMBULATORY_CARE_PROVIDER_SITE_OTHER): Payer: Medicare PPO | Admitting: Cardiology

## 2010-05-11 ENCOUNTER — Encounter: Payer: Self-pay | Admitting: Cardiology

## 2010-05-11 VITALS — BP 129/68 | HR 49 | Ht 68.0 in | Wt 199.0 lb

## 2010-05-11 DIAGNOSIS — I2510 Atherosclerotic heart disease of native coronary artery without angina pectoris: Secondary | ICD-10-CM

## 2010-05-11 DIAGNOSIS — I4891 Unspecified atrial fibrillation: Secondary | ICD-10-CM

## 2010-05-11 DIAGNOSIS — Z954 Presence of other heart-valve replacement: Secondary | ICD-10-CM | POA: Insufficient documentation

## 2010-05-11 DIAGNOSIS — I429 Cardiomyopathy, unspecified: Secondary | ICD-10-CM

## 2010-05-11 NOTE — Assessment & Plan Note (Signed)
History of bicuspid aortic valve with stenosis and fusiform ascending aortic aneurysm status post Bentall procedure in 2002. Stable prosthesis by echocardiogram September 2010. For followup study prior to next visit.

## 2010-05-11 NOTE — Assessment & Plan Note (Signed)
No active angina, previously documented nonobstructive disease. Myocardial perfusion study from 2010 noted above, showing probable soft tissue attenuation without frank ischemia.

## 2010-05-11 NOTE — Patient Instructions (Signed)
Your physician wants you to follow-up in: 6 months. You will receive a reminder letter in the mail one-two months in advance. If you don't receive a letter, please call our office to schedule the follow-up appointment. Your physician has requested that you have an echocardiogram. Echocardiography is a painless test that uses sound waves to create images of your heart. It provides your doctor with information about the size and shape of your heart and how well your heart's chambers and valves are working. This procedure takes approximately one hour. There are no restrictions for this procedure. Do Echo in 6 months before next office visit. Your physician recommends that you go to the [facility] for a FASTING lipid profile and liver function and thyroid labs in 6 months before your next office visit. Do not eat or drink after midnight.

## 2010-05-11 NOTE — Assessment & Plan Note (Signed)
In sinus rhythm today. Has occasional palpitations, likely breakthrough atrial fibrillation or flutter. Continue Coumadin and amiodarone. For followup TSH and liver function tests prior to next visit.

## 2010-05-11 NOTE — Assessment & Plan Note (Signed)
LVEF 40-45% based on prior echocardiogram from September 2010. Followup study prior to next visit in 6 months.

## 2010-05-11 NOTE — Progress Notes (Signed)
Clinical Summary Mr. Prokop is a 70 y.o.male presenting for routine followup. He was seen in August 2011.  Recent lab work from 9 March showed AST 38, ALT 32, triglycerides 74, cholesterol 126, HDL 46, LDL 65. We reviewed this today.  He states that he has been trying to exercise more regularly. He has still gained some weight. He does experience occasional palpitations. No dizziness or syncope. No angina or breathlessness beyond NYHA class II.   No Known Allergies  Current Outpatient Prescriptions on File Prior to Visit  Medication Sig Dispense Refill  . amiodarone (PACERONE) 200 MG tablet Take 200 mg by mouth daily.        Marland Kitchen atorvastatin (LIPITOR) 20 MG tablet Take 20 mg by mouth daily.        . metoprolol (TOPROL XL) 50 MG 24 hr tablet Take 1&1/2 by mouth twice daily        . Multiple Vitamin (MULTIVITAMIN PO) Take one by mouth daily        . Omega-3 Fatty Acids (FISH OIL) 1000 MG CAPS Take one by mouth twice daily         . Tamsulosin HCl (FLOMAX) 0.4 MG CAPS Take one by mouth daily        . warfarin (COUMADIN) 5 MG tablet Use as directed          Past Medical History  Diagnosis Date  . Atrial fibrillation     Paroyxsmal atrial fibrillation and atrial flutter  . Coronary artery disease     Nonobstructive at cardiac catherization 2002  . Mixed hyperlipidemia   . Essential hypertension, benign   . Left bundle branch block   . BPH (benign prostatic hypertrophy)   . Cardiomyopathy     Nonischemic (EF 45%)  . Aortic valve, bicuspid     Mechanical AVR  . Aortic aneurysm, thoracic     Fusiform s/p Bentall procedure 2002    Social History Mr. Mashek reports that he quit smoking about 10 years ago. His smoking use included Cigarettes. He has never used smokeless tobacco. Mr. Hartstein reports that he drinks alcohol.  Review of Systems No fevers, chills, or unusual weight change. No chest pain, progressive shortness of breath, cough, hemoptysis, or wheezing. No palpitations,  dizziness, or syncope. No dysphasia or odynophagia. Stable appetite with no abdominal pain, melena, or hematochezia. No orthopnea, PND, or lower extremity edema. No focal motor weakness, memory problems, or speech deficits. Otherwise systems reviewed and negative except as already outlined.   Physical Examination Filed Vitals:   05/11/10 1436  BP: 129/68  Pulse: 49  Overweight male in no acute distress. HEENT: Conjunctiva and lids normal, oropharynx clear. Neck: Supple, no carotid bruits, no thyromegaly. Lungs: Clear to auscultation, nonlabored. Cardiac:RRR, crisp prosthetic mechanical aortic valve click, no diastolic murmur, no S3 gallop. Abdomen: Soft, nontender, no hepatomegaly, no bruits. Extremities: No significant pitting edema, few small varicosities. distal pulses are 2+. Skin: Warm and dry. Musculoskeletal: No kyphosis. Neuropsychiatric: Alert oriented x3, appropriate.   ECG Sinus bradycardia at 53 beats per minute with PAC, old left bundle branch block pattern  Studies Echocardiogram 10/17/08: LVEF 40-45%, paradoxical septal motion, mild to moderate left atrial enlargement, mild right atrial enlargement, mechanical aortic prosthesis, mean aortic valve gradient 14 mm mercury, mild to moderate mitral regurgitation, trace tricuspid regurgitation, RVSP 39 mm mercury.  Lexiscan Cardiolite 01/14/09: 1. Lexiscan bolus per protocol. The patient experienced no chest pain. Nondiagnostic secondary to LBBB. Normal blood pressure response to stress. 2. Abnormal  LV perfusion. Limitations and artifact were due to diaphragm or other soft tissue. There was a small, fixed defect in the mid anterior, mid anteroseptal segment(s). The degree of photon reduction was mild. There was a small, fixed defect in the basal inferior, mid inferoseptal segment(s). The degree of photon reduction was mild. No clearly reversible defects to suggest ischemia. Global left ventricular systolic function was moderately  reduced. The patient's calculated post stress LVEF was 40%. The patient's end diastolic volume was . TID Ratio 1.  Problem List and Plan

## 2010-05-13 NOTE — Letter (Signed)
Summary: Engineer, materials at [facility]  [address]   [place], [place] [address]   Phone: [phone]  Fax: [phone]        May 03, 2010 MRN: [id number]    Timothy Fritz [address] [place], [place]  [address]    Dear Mr. Trimpe,  Your test ordered by Selena Batten has been reviewed by your physician (or physician assistant) and was found to be normal or stable. Your physician (or physician assistant) felt no changes were needed at this time.  ____ Echocardiogram  ____ Cardiac Stress Test  _X___ Lab Work  ____ Peripheral vascular study of arms, legs or neck  ____ CT scan or X-ray  ____ Lung or Breathing test  ____ Other:   Thank you.   Cyril Loosen, RN, BSN    Duane Boston, M.D., F.A.C.C. Thressa Sheller, M.D., F.A.C.C. Oneal Grout, M.D., F.A.C.C. Cheree Ditto, M.D., F.A.C.C. Daiva Nakayama, M.D., F.A.C.C. Kenney Houseman, M.D., F.A.C.C. Jeanne Ivan, PA-C

## 2010-05-27 ENCOUNTER — Encounter: Payer: Self-pay | Admitting: Cardiology

## 2010-06-23 ENCOUNTER — Other Ambulatory Visit: Payer: Self-pay | Admitting: *Deleted

## 2010-06-23 MED ORDER — ATORVASTATIN CALCIUM 20 MG PO TABS: 20.0000 mg | ORAL_TABLET | Freq: Every day | ORAL | Status: DC

## 2010-06-29 NOTE — Assessment & Plan Note (Signed)
[facility]                          [facility] NOTE   Timothy, Fritz                     MRN:          [id number]  DATE:09/12/2007                            DOB:          11-26-1940    PRIMARY CARE PHYSICIAN:  Timothy Peri, MD   REASON FOR VISIT:  Scheduled followup.   HISTORY OF PRESENT ILLNESS:  Timothy Fritz presents for regular visit.  He  continues to experience fairly sporadic brief palpitations but nothing  prolonged.  We have not changed his amiodarone dose.  Today his heart  rate is 48 beats per minute, which has had before with sinus  bradycardia.  He is not experiencing any chest pain or progressive  breathlessness.  He states that he was evaluated in a hospital then in  [place] when he was traveling and had some problems at that time  with epigastric pain and abdominal distention.  He was ultimately  diagnosed with gas and possibly food poisoning after eating some bed  strawberry pie.  He is not having any bleeding problems and otherwise,  he is due for a PT/INR check today.   ALLERGIES:  No known drug allergies.   PRESENT MEDICATIONS:  1. Coumadin as directed by the Coumadin clinic.  2. Toprol-XL 75 mg p.o. b.i.d.  3. Amiodarone 200 mg p.o. daily.  4. Lipitor 10 mg p.o. daily.  5. Multivitamin 1 p.o. daily.  6. Omega-3 supplements 1000 mg p.o. daily.   REVIEW OF SYSTEMS:  As per the history of present illness.  Otherwise  negative.   PHYSICAL EXAMINATION:  VITAL SIGNS:  Blood pressure is 145/77, heart  rate is 48, weight 187.4 pounds which is stable.  GENERAL:  The patient is comfortable, in no acute distress.  HEENT:  Conjunctiva is normal.  NECK:  Supple.  No elevated jugular venous pressure.  No loud bruits.  LUNGS:  Clear without labored breathing.  CARDIAC:  Crisp prosthetic valve, click, soft systolic murmur.  No S3  gallop.  EXTREMITIES:  No pitting edema.   IMPRESSION AND RECOMMENDATIONS:  1.  History of paroxysmal atrial fibrillation.  We plan to continue      Coumadin, amiodarone and Toprol at present doses.  If he had any      prolonged sense of palpitations, he could take an additional 1/2      tablet of Toprol-XL p.r.n.  He does not seem to be very bothered by      his palpitations at this time.  2. Congenital bicuspid aortic valve with fusiform aortic aneurysm,      status post dental procedure in 2002 with normal left ventricle      systolic function and a chronic left bundle branch block.  This has      been stable.  3. Hypertension and hyperlipidemia are followed by Timothy Fritz.  If his      blood pressure remains up one can consider the addition of an      angiotensin-converting enzyme inhibitor.     [name], MD  Electronically Signed    SGM/MedQ  DD: 09/12/2007  DT: 09/13/2007  Job #: [id number]   cc:   Timothy Peri, MD

## 2010-06-29 NOTE — Assessment & Plan Note (Signed)
[facility]                          [facility] NOTE   SAINT, HANK                     MRN:          [id number]  DATE:09/21/2006                            DOB:          [birth year]    PRIMARY CARDIOLOGIST:  [name], MD   REASON FOR VISIT:  Annual followup.   Mr. Temme reports no significant change from his baseline level of  mild exertional dyspnea since last seen here in the clinic, by Dr.  Diona Browner, in October 2007.   The patient's cardiac history is notable for surgical repair of a  bicuspid aortic valve and fusiform aortic aneurysm with Bentyl procedure  in 2002, by Dr.  Evelene Croon.   The patient's last echocardiogram was in 2006, which suggested no  evidence of vegetation on the prosthetic valve. There was trivial,  perivalvular aortic insufficiency and no other valvular abnormalities.  This study was apparently in followup of a 2D echocardiogram which  suggested mild/moderate aortic insufficiency, possibly perivalvular in  etiology.   Prior to his surgery, the patient had a diagnostic coronary angiogram  which suggested non-obstructive coronary artery disease and normal left  ventricular function. It was at that time that the patient was noted to  have a large ascending aortic aneurysm with associated aortic  regurgitation. He was also found to have atrial fibrillation with  intermittent left bundle branch block.   A 12-lead electrocardiogram today is notable for persistent sinus  bradycardia at 52 beats per minute, but with evidence of a new left  bundle branch block.   CURRENT MEDICATIONS:  1. Coumadin as directed.  2. Toprol XL 75 mg b.i.d.  3. Amiodarone 100 daily.  4. Lipitor 10 daily.  5. Fish oil.   PHYSICAL EXAMINATION:  Blood pressure 140/80, pulse 60 and regular,  weight 192.  GENERAL: A 69 year old male sitting upright in no distress.  HEENT: Normocephalic, atraumatic.  NECK:  Palpable bilateral carotid pulses without bruits. No JVD at 90  degrees.  LUNGS:  Clear to auscultation in all fields.  HEART: Regular rate and rhythm (S1, S2). No significant murmurs. No  rubs.  ABDOMEN: Soft, nontender with intact bowel sounds.  EXTREMITIES: Intact distal pulses without edema.  NEURO: No focal deficit.   IMPRESSION:  1. Congenital bicuspid aortic valve/fusiform aortic aneurysm.      a.     Status post Bentyl procedure in 2002.  2. Normal left ventricular function.  3. History of non-obstructive coronary artery disease.      a.     By cardiac catheterization in 2002.  4. Left bundle branch block.  5. History of paroxysmal atrial fibrillation.      a.     Maintaining normal sinus rhythm on amiodarone.  6. Chronic Coumadin.  7. Hypertension.  8. Hyperlipidemia.      a.     Followed by Dr.  Sherryll Burger.   PLAN:  1. Following review with Dr.  Simona Huh, recommendation is to      proceed with a repeat ischemic evaluation with an adenosine stress      Cardiolite for risk  stratification. The patient's records were      reviewed and there was suggestion of prior history of intermittent      left bundle branch block which most likely was rate-related. Of      note, however, this does now appear in the setting of chronic,      asymptomatic sinus bradycardia. Further recommendations will be      made pending review of this study.  2. Schedule return clinic followup with myself and Dr.  Diona Browner in      one year or sooner as needed.      Rozell Searing, PA-C  Electronically Signed      [name], MD  Electronically Signed   GS/MedQ  DD: 09/21/2006  DT: 09/21/2006  Job #: [id number]

## 2010-06-29 NOTE — Assessment & Plan Note (Signed)
[facility]                          [facility]   Timothy Fritz, Timothy Fritz                     MRN:          [id number]  DATE:04/02/2008                            DOB:          [birth year]    PRIMARY CARE PHYSICIAN:  [name], MD   REASON FOR VISIT:  Routine followup.   HISTORY OF PRESENT ILLNESS:  Timothy Fritz comes back in for a 67-month  visit.  He denies having any problems with palpitations since I last saw  him or chest pain with exertion.  He continues to exercise regularly  without angina or increasing breathlessness.  He has been compliant with  his medications.  I do not see any recent lab work and follow up of his  liver function, lipids, or thyroid studies.  His electrocardiogram today  shows sinus bradycardia at 44 beats per minute with an old left bundle-  branch block pattern.  He has had no dizziness or syncope and does not  sense any exercise limitation.   ALLERGIES:  No known drug allergies.   PRESENT MEDICATIONS:  1. Coumadin as per the Coumadin Clinic.  2. Toprol-XL 75 mg p.o. b.i.d.  3. Amiodarone 200 mg p.o. daily.  4. Lipitor 10 mg p.o. daily.  5. Multivitamin 1 p.o. daily.  6. Omega-3 supplements 1000 mg p.o. daily.   REVIEW OF SYSTEMS:  As described in the history of present illness,  otherwise negative.   PHYSICAL EXAMINATION:  VITAL SIGNS:  Blood pressure is 147/80, heart  rate is 45, and weight is 187 pounds.  GENERAL:  The patient is comfortable and in no acute distress.  HEENT:  Conjunctiva is normal.  Oropharynx is clear.  NECK:  Supple.  No elevated jugular venous pressure.  No loud bruits.  No lymphadenopathy.  LUNGS:  Clear without labored breathing at rest.  CARDIAC:  Regular rate and rhythm with a crisp prosthetic click, soft  systolic murmur radiating towards the apex.  No diastolic murmur.  No  pericardial rub.  ABDOMEN:  Soft and nontender.  Normoactive bowel sounds.  No  hepatomegaly.  EXTREMITIES:  No significant pitting edema.  Distal pulses are 2+.  SKIN:  Warm and dry.  MUSCULOSKELETAL:  No kyphosis noted.  NEUROPSYCHIATRIC:  The patient is alert and oriented x3.  Affect is  appropriate.   IMPRESSION AND RECOMMENDATIONS:  1. History of paroxysmal atrial fibrillation, presently on Coumadin,      Toprol-XL, and amiodarone.  He has sinus bradycardia at rest,      although it does not appear to be symptom provoking.  I will obtain      repeat liver function and thyroid function studies, and otherwise      plan to see him back in the next 6 months.  He is not reporting any      breathlessness or cough.  2. History of congenital bicuspid aortic valve with fusiform ascending      aortic aneurysm, status post Bentall procedure in 2002 with normal      left ventricular systolic function.  This has been stable.  Ejection fraction was 50-55% at echocardiography in 2008 with a      normally functioning prosthesis.  We can consider a followup      echocardiogram around the time of his next visit.     Timothy Sidle, MD  Electronically Signed    SGM/MedQ  DD: 04/02/2008  DT: 04/03/2008  Job #: [id number]   cc:   [name], MD

## 2010-06-29 NOTE — Assessment & Plan Note (Signed)
[facility]                          [facility]   Timothy Fritz, Timothy Fritz                     MRN:          [id number]  DATE:01/30/2007                            DOB:          [birth year]    PRIMARY CARE PHYSICIAN:  Timothy Fritz.   REASON FOR VISIT:  Routine cardiac followup.   HISTORY OF PRESENT ILLNESS:  Timothy Fritz was seen back in August of this  year by Timothy Fritz.  His history is detailed in my previous note.  Over  the last few months, he has had some problems with recurrent  palpitations, some rapid and prolonged.  His amiodarone was increased  from 100 mg to 200 mg daily following a brief burst to 400 mg daily for  a few days.  He also wore an outpatient cardiac monitor, which did not  demonstrate any frank atrial fibrillation, but he did have some brief  atrial runs and atrial bigeminy noted.  He has a left bundle branch  block pattern on electrocardiogram, which is old.  Otherwise, he is not  experiencing any anginal chest pain or breathlessness.  I reviewed the  results of his monitoring today, and we talked about continuing  amiodarone at the present dose.  He otherwise continues with the  Coumadin Clinic as well.   ALLERGIES:  No known drug allergies.   PRESENT MEDICATIONS:  1. Coumadin as directed by the Coumadin clinic.  2. Toprol XL 75 mg p.o. b.i.d.  3. Amiodarone 200 mg p.o. daily.  4. Lipitor 10 mg p.o. daily.  5. Multivitamin daily.  6. Omega 3 supplements daily.   REVIEW OF SYSTEMS:  As described in the history of present illness.   EXAMINATION:  Blood pressure is 122/72, heart rate is 64, weight 188  pounds.  The patient is comfortable in no acute distress.  Conjunctivae and lids normal.  Oropharynx clear.  NECK:  Supple.  No elevated jugular venous pressure.  No loud bruits.  LUNGS:  Clear without labored breathing.  CARDIAC:  Crisp prosthetic valve click.  Soft systolic murmur.  No S3  gallop.  EXTREMITIES:  No significant pitting edema.   IMPRESSION/RECOMMENDATIONS:  1. History of congenital bicuspid aortic valve with fusiform aortic      aneurysm status post Bentyl procedure in 2002 with normal left      ventricular systolic function, nonobstructive cardiovascular      disease, and a chronic left bundle branch block pattern.  The      patient is symptomatically stable.  I will plan to continue his      present regimen, and he will have followup at the Coumadin Clinic.      We will see him back over the next 6 months.  2. History of previously documented paroxysmal atrial fibrillation.      Palpitations noted recently without frank atrial fibrillation,      although with brief atrial runs and atrial bigeminy.  Agree with      increasing amiodarone to 200 mg daily for the time being.  We can      always reassess  this long term.  3. History of hypertension, hyperlipidemia followed by Timothy Fritz.     Timothy Sidle, MD  Electronically Signed    SGM/MedQ  DD: 01/30/2007  DT: 01/30/2007  Job #: [id number]   cc:   [name], MD

## 2010-07-02 NOTE — Discharge Summary (Signed)
[place]. [facility]  Patient:    Timothy Fritz, Timothy Fritz Visit Number: [id number] MRN: [id number]          Service Type: MED Location: [phone] Attending Physician:  Cleatrice Burke Dictated by:   Dominica Severin, P.A. Admit Date:  10/25/2000 Disc. Date: 10/28/00   CC:         [name], M.D., [facility]   Discharge Summary  DATE OF BIRTH:  10/03/1940  PRIMARY ADMISSION DIAGNOSIS:  Left pleural effusion.  SECONDARY DIAGNOSES: 1. Coronary artery disease. 2. History of Bentall procedure. 3. Hypertension. 4. Hyperlipidemia. 5. Wide complex tachycardia. 6. History of tobacco abuse.  DISCHARGE DIAGNOSIS:  Status post drainage of left pleural effusion.  PROCEDURE:  Insertion of left chest tube was done on October 25, 2000 by Dr. Evelene Croon.  HOSPITAL COURSE:  This patient was admitted on October 24, 2000, for a left pleural effusion.  The patient had presented on October 10, 2000, with complaints of generalized decreasing antigen level after undergoing a procedure in July 2002 for ascending thoracic aneurysm.  A chest x-ray the following week revealed left pleural effusion.  Left thoracentesis was performed yielding 13 cc of serosanguineous fluid which resulted in marked improvement of the breath sounds.  Repeat chest x-ray on September 20, however, demonstrated a large pleural effusion which was recurrent. Dr. Laneta Simmers had recommended to admit the patient for a chest tube placement and hopefully resolution of his effusion.  The patient tolerated the chest tube placement without any difficulty.  His hospital course was essentially stable. He did complain of some occasional shortness of breath, nevertheless was tolerating the chest tube without difficulty.  A 28 French chest tube was inserted, and 1700 cc of serosanguineous fluid was removed.  The chest tube was placed to suction.  His INR was 5.1 on the day of admission.   Coumadin was held.  His goal INR was 2.5-3.  The patient was seen in consultation by [facility].  He had remained in stable condition throughout his hospital course.  He did have an episode of wide complex tachycardia on October 27, 2000.  His medications were adjusted.  On hospital day #4, the patients chest tube did not have any drainage.  There was no air leak, and his chest tube was removed.  Follow-up chest x-ray revealed a left pneumothorax with resolution of bilateral effusions and pneumothorax was felt to be approximately 10%.  The patient was not short of breath.  His oxygen saturation was approximately 93% on room air.  His incentive spirometer was around 1200, and the patient did not have any complaints.  His INR on hospital day #4 was 3.6 from previously 4.7.  His Coumadin was restarted at 1 mg.  He will take 1 mg on Sunday and have his INR checked on Monday.  Cardiology felt the patient was stable for discharge, and the patient will be discharged today in stable condition.  He will follow up with Dr. Laneta Simmers this coming Tuesday the 17th of September with a chest x-ray from the [facility].  DISCHARGE MEDICATIONS: 1. Ultram 50 mg 1-2 tabs every 4-6 hours as needed for pain. 2. Pepcid 20 mg twice daily. 3. ______  300 mg 1 twice daily. 4. Amiodarone 200 mg 1 daily. 5. Toprol 75 mg twice a day. 6. Xanax 0.25 mg 1 every 8 hours as needed for anxiety. 7. Coumadin - he was sent home with a 2 mg tablet.  He  is to take as directed    by his PT/INR blood work.  ACTIVITY AND FOLLOW-UP:  He is to avoid any strenuous activity or driving.  He is to continue the use of his incentive spirometer every hour.  He is follow a low fat, low sodium diet.  He is told he can shower and clean his incision with mild soap and water.  He can keep the chest dressing on if needed if there is any drainage, and he is to see Dr. Regino Schultze on Monday, September 16, at 9:30 a.m. for INR check.  He is to  follow up with Dr. Laneta Simmers on the 17th after a chest x-ray at the [facility], and our office will call him with his appointment time, and Dr. Diona Browner is to see him on September 27 at 10:15 a.m. Dictated by:   Dominica Severin, P.A. Attending Physician:  Cleatrice Burke DD:  10/28/00 TD:  10/28/00 Job: [phone] [id number]

## 2010-07-02 NOTE — Discharge Summary (Signed)
[place]. [facility]  Patient:    Timothy Fritz, Timothy Fritz                       MRN: [id number] Adm. Date:  [id number] Disc. Date: [id number] Attending:  Cleatrice Burke Dictator:   Loura Pardon, P.A. CC:         Lewayne Bunting, M.D., cardiologist  [facility], [place], [place]  Dr. [name], primary care   Discharge Summary  DATE OF BIRTH:  09-10-1940.  DISCHARGE DIAGNOSES: 1. Ascending thoracic aortic aneurysm measuring 5.6 cm from the aortic root    to the innominate artery. 2. Congenital bicuspid aortic valve. 3. Nonobstructive atherosclerotic coronary artery disease with 20% lesions    in the first obtuse and second obtuse marginals, ejection fraction 56% with    no wall motion abnormalities, 1+ aortic regurgitation. 4. History of preoperative "palpitations" per patient. 5. Acute blood loss anemia postoperative requiring transfusions. 6. Early declaration postoperatively of dysrhythmias lasting from    postoperative day #1 to postoperative day #8 consisting of varied    ectopies predominantly paroxysmal atrial fibrillation with _______    aberrancy, at times left bundle branch block tachycardia and some possible    arteriovenous dissociations suggesting ventricular tachycardia, all are    symptomatic, all are nonsustained.  The patient has been treated with    constant adjustment of medications to achieve sinus rhythm with very few    premature ventricular complexes at the time of discharge. 7. Postoperative confusion resolving by postoperative day #5.  SECONDARY DIAGNOSES: 1. Hyperlipidemia. 2. Status post tonsillectomy. 3. History of tobacco habituation. 4. Strong family history of atherosclerotic coronary artery disease.  PROCEDURE: 1. September 06, 2000, left heart catheterization.  The study showed 1+    aortic regurgitation, the presence of a large ascending thoracic    aortic aneurysm, no wall motion abnormalities, ejection fraction of    56% and  nonobstructive coronary artery disease in the obtuse marginals    of the circumflex.  The films were discussed with Alleen Borne, M.D. 2. September 07, 2000, Bentall procedure with placement of a 25 mm St. Jude    mechanical valve/conduit. 3. A 2-D echocardiogram September 14, 2000.  The study demonstrated overall    left ventricular function to be 55 to 65%, left ventricular wall thickness    mildly increased, mechanical aortic valve prosthesis present. There was    no paravalvular aortic regurgitation.  Unable to rule out localized    pericardial effusion.  There seemed to be a small to moderate free-flowing    pericardial effusion. A computed tomogram was recommended to further    evaluate. 4. Computed tomogram of the chest, September 14, 2000, spiral CT of the chest    was performed showing no evidence of thoracic aortic aneurysm or    dissection. There was a small amount of pericardial effusion considered    a normal postoperative finding, small pleural effusions with mild    bilateral atelectasis.  DISCHARGE DISPOSITION:  Timothy Fritz is a candidate for discharge on postoperative day #11 after undergoing Bentall procedure by Dr. Laneta Simmers. He was extubated on the day of surgery and had early declaration of dysrhythmias consisting of paroxysmal atrial fibrillation with left bundle branch block aberrancy as well as left bundle branch block tachycardia.  These have been treated throughout the postoperative course by both members of the surgical team and cardiology physicians who have been following this patient.  The final regimen has been decided upon after achieving a sinus rhythm with very little ectopy by postoperative day #10.  Timothy Fritz was found to be anemic and transfused on both postoperative day # and postoperative day #5.  He was early started on Coumadin the evening of postoperative day #1 and was maintained on Lovenox subcutaneous injections until the Coumadin became  therapeutic.  His Coumadin became therapeutic by postoperative day #5.  He did exhibit some postoperative confusion and anxiety but this has resolved also by postoperative day #5 and he did well on a change of medication from Percocet to Ultram and with low dose Xanax for anxiety.  In the setting of continued ectopy, he was seen by Nathen May, M.D., [facility], on postoperative day #8. At that time, Dr. Graciela Husbands assessed the rhythm patterns that had been present and recommended continued medical treatment in the hope that ectopy would become markedly decreased and if so, he could be discharged with follow-up. Otherwise, if the ectopy consisting of sustained beats of wide complex tachycardia continued, he would be a candidate for electrophysiological testing.  In the ensuing three days postoperative day #9, 10, and 11, Timothy Fritz indeed did show a marked decrease in ectopy.  On postoperative day #9 he was in a sinus rhythm with episodes of bigeminy only.  Postoperative day #10 in a predominantly sinus rhythm with very occasional PVCs.  He was seen by Lewayne Bunting, M.D., on postoperative day #10. His medications were adjusted to their discharge levels. He was felt to be a candidate for discharge postoperative day #11. He goes home on the following medications.  DISCHARGE MEDICATIONS: 1. Ultram 50 mg one to two tablets p.o. q.3-4h. p.r.n. pain. 2. Toprol XL 25 mg three tablets twice daily. 3. Amiodarone 200 mg two tablets daily for one week August 5-11, 2002,    and then amiodarone 200 mg one tablet daily beginning Monday,    September 25, 2000. 4. Fergon 300 mg p.o. twice daily with food. 5. Pepcid 20 mg p.o. b.i.d. 6. Xanax 0.25 mg every eight hours as needed for anxiety. 7. Coumadin very low dose in setting of amiodarone to be decided at    discharge but it is thought to maintain his INR in the range that is    desired 2.5 to 3.5 that he would need 2.5 mg daily.   DISCHARGE ACTIVITY:   Ambulate to build up strength. He is asked not to lift more than 10 pounds nor to drive until he sees Dr. Laneta Simmers.  DISCHARGE DIET:  Low sodium, low cholesterol diet.  WOUND CARE:  He may shower daily keeping his incisions clean and dry.  FOLLOW-UP:  He was to present to the [facility] Tuesday, September 19, 2000, in the morning to get electrocardiogram and check the Coumadin level. Telephone number there is [phone].  He will see Dr. Felisa Bonier physician assistant, Monday, September 25, 2000, at 1:45 in the afternoon. A chest x-ray will be taken and he will have a follow-up visit with Dr. Laneta Simmers in three weeks.  Dr. Garen Grams office will call with an appointment and time.  BRIEF HISTORY:  Timothy Fritz is a 70 year old gentleman with a history of hyperlipidemia.  He has been evaluated by Dr. [name] for chest pain and palpitations. In March of 2002, he underwent an exercise treadmill test which was within normal limits.  An echocardiogram showed left ventricular contractility which was normal with no wall motion abnormalities.  He was noted  to have an ascending aortic aneurysm. The aortic valve was known to be bicuspid.  He has subsequently underwent a computed tomogram of the chest July 24, 2000. This study demonstrated a 5.4 x 5.6 ascending aortic aneurysm beginning at the level of the aortic root and extending to the level of the innominate artery.  The patient primarily complains of palpitations particularly with exertion.  He describes this as a pounding sensation in his epigastrium and lower substernal region.  He has not had any chest pain or pressure.  He denies any shortness of breath and still remains active.  With a finding of a 5.6 cm fusiform ascending aortic aneurysm, it was Dr. Garen Grams opinion that this should be treated surgically requiring replacement of the aortic valve and ascending aorta by placing a St. Jude valve conduit and reimplanting the coronary arteries.   He is scheduled for admission to [facility]. [facility] on September 06, 2000, with left heart catheterization and then precede with surgery by Dr. Laneta Simmers.  HOSPITAL COURSE:  This is as described in the discharge disposition.  It lasted 11 days.  He did have some postoperative confusion/delirium but this resolved by postoperative day #5. He did have significant nonsustained wide complex tachycardias in the postoperative period which had been managed aggressively with medical therapy provided by both the [facility] medical personnel as well as the cardiologist following the patient.  At the time of discharge he has a predominantly sinus rhythm with very few premature ventricular complexes.  He is felt suitable for discharge on the medications as dictated above with follow-up closely at the [facility] and with Dr. Laneta Simmers. DD:  09/17/00 TD:  09/19/00 Job: [id number]

## 2010-07-02 NOTE — Op Note (Signed)
[place]. [facility]  Patient:    Timothy Fritz, Timothy Fritz                       MRN: [id number] Proc. Date: 09/07/00 Attending:  Alleen Borne, M.D. CC:         Lewayne Bunting, M.D.  Cath lab   Operative Report  PREOPERATIVE DIAGNOSIS:  Bicuspid aortic valve and ascending aortic aneurysm.  POSTOPERATIVE DIAGNOSIS:  Bicuspid aortic valve and ascending aortic aneurysm  OPERATION PERFORMED:  Median sternotomy, extracorporeal circulation, aortic valve replacement and ascending aortic replacement using a 25 mm St. Jude valved conduit with reimplantation of the coronary arteries into the conduit.  SURGEON:  Alleen Borne, M.D.  ASSISTANT:  Areta Haber, P.A.  ANESTHESIA:  General endotracheal.  INDICATIONS FOR PROCEDURE:  The patient is a 70 year old gentleman with a history of hyperlipidemia and a family history of heart disease who has been followed by Dr. Clelia Croft.  He was evaluated for chest pain and palpitations and in March 2002 underwent an exercise treadmill test that was within normal limits. An echocardiogram showed normal left ventricular contractility.  He was noted to have an ascending aortic aneurysm.  The aortic valve was known to be bicuspid.  He subsequently underwent a chest CT scan on July 24, 2000 which showed a 5.4 cm x 5.6 cm ascending aortic aneurysm beginning at the level of the aortic root in proximity to the take off of the coronary arteries and extending up to the level of the innominate artery.  After review of the CT scan and examination of the patient, it was felt that aortic valve replacement and replacement of his ascending aorta using a valved conduit would be the best treatment.  He was brought into the hospital the day before surgery and underwent cardiac catheterization which showed no significant coronary disease.  It did demonstrate the large ascending abdominal aortic aneurysm. Left ventricular function appeared well  preserved.  I discussed the operative procedure with the patient and his wife including alternatives, benefits and risks including bleeding, possible blood transfusion, infection, stroke, myocardial infarction and death.  I also discussed the importance of maintaining adequate anticoagulation and that he would need to be on Coumadin for the rest of his life.  He had no contraindication to anticoagulation and had no problem with being on Coumadin and was in agreement with this type of valve.  DESCRIPTION OF PROCEDURE:  The patient was taken to the operating room and placed on the table in supine position.  After induction of general endotracheal anesthesia, a Foley catheter was placed in the bladder using sterile technique.  Preoperative vancomycin and Zinacef were given.  Then the chest, abdomen and both lower extremities were prepped and draped in the usual sterile manner.  The chest was entered through a median sternotomy incision and the pericardium opened in the midline. Examination of the heart showed good ventricular contractility.  The ascending aorta was markedly aneurysmal beginning at the level of the coronary arteries and extending up to a point just before the innominate artery take off.  This was a fusiform type aneurysm.  There were no palpable plaques in the aorta.  The size of the aorta was measured at about 5.6 cm maximum diameter.  Then the patient was heparinized and when an adequate activated clotting time was achieved, the proximal portion of the aortic arch just beyond the innominate artery was cannulated using a 20 French aortic cannula for arterial  inflow.  Venous outflow was achieved using a two-stage venous cannula through the right atrial appendage.  An antegrade cardioplegia and vent cannula was inserted into the aortic root. A retrograde cardioplegia cannula was inserted through the right atrium into the coronary sinus.  A left ventricular vent was inserted  through the right superior pulmonary vein.  The patient was placed on cardiopulmonary bypass.  Then the aorta was cross-clamped and 500 cc of cold blood antegrade cardioplegia was administered in the aortic root with quick arrest of the heart.  Systemic hypothermia to 20 degrees centigrade and topical hypothermia with iced saline was used.  A temperature probe was placed in the septum and an insulating pad in the pericardium.  Additional doses of retrograde cardioplegia were given at approximately 30 minute intervals to maintain myocardial temperature around 5 to 10 degrees centigrade.  Then the ascending aorta was opened longitudinally.  The native aortic valve was examined and was a bicuspid valve.  There was fusion of the left and noncoronary cusps along their entire length.  The valve leaflets were enlarged and somewhat redundant.  Then the left and right coronary ostia were excised as buttons taking care to maintain proper orientation.  Then the aortic aneurysm was excised leaving a cuff of aorta around the annulus.  The native valve was excised.  The annulus was sized and a 25 mm St. Jude valve conduit was chosen.  Then a series of pledged 2-0 Ethibond horizontal mattress sutures were placed around the annulus with the pledgets in a subannular position. The sutures were then placed through a sewing ring and the valve lowered in place.  The sutures were tied sequentially.  The valve seated nicely and the leaflets were functioning normally without obstruction.  Then the left coronary button was anastomosed to the side of the conduit using continuous 5-0 Prolene suture.  Then the distal anastomosis of the conduit to the distal ascending aorta was performed in an end-to-end manner using a continuous 3-0 Prolene suture with a felt strip to reinforce the anastomosis. Then the aortic root was filled with blood.  The heart was filled with blood and the position of the right coronary button  was measured.  A small opening  was made in the conduit using eye cautery and then the anastomosis of the right coronary button to the conduit was performed using continuous 5-0 Prolene suture.  The patient was rewarmed to 37 degrees centigrade.  Then a cannula was inserted into the conduit.  The left side of the heart was deaired as much as possible.  Then the head was placed in the Trendelenburg position. The crossclamp was removed with a time of 142 minutes.  There was spontaneous return of ventricular fibrillation and the patient was defibrillated into sinus rhythm.  Then the suture lines and anastomoses were all examined and there was diffuse bleeding from needle holes but no obvious leaks.  Two temporary right ventricular and right atrial pacing wires were placed and brought out through the skin.  After further deairing maneuvers, the left ventricular vent and retrograde cardioplegia cannulas were removed.  The patient was then weaned from cardiopulmonary bypass on no inotropic agents.  Total bypass time was 170 minutes.  Cardiac function appeared good with a cardiac output of 4 to 5L per minute.  Transesophageal echocardiogram was performed and the aortic valve prosthesis appeared to be functioning normally with preserved left ventricular function.  There was no significant mitral regurgitation.  Protamine was given and the venous  and aortic cannulas were removed without difficulty. Hemostasis was achieved.  The patient was given Amicar and was also given 8 units of platelets since he had been on aspirin preoperatively.  This resulted in good hemostasis.  Two chest tubes were placed with a tube in the posterior pericardium and one in the anterior mediastinum.  The pericardium was reapproximated over the heart.  The sternum was closed with #6 stainless steel wires.  The fascia was closed with continuous #1 Vicryl suture.  The subcutaneous tissues were closed using continuous 2-0  Vicryl and the skin with 3-0 Vicryl subcuticular skin closure.  The sponge, needle and instrument counts were correct according to the scrub nurse.  A dry sterile dressing was applied over the incisions and around the chest tubes which were hooked to Pleur-evac suction.  The patient remained hemodynamically stable and was transported to the SICU in guarded but stable condition. DD:  09/07/00 TD:  09/07/00 Job: [id number]

## 2010-07-02 NOTE — Assessment & Plan Note (Signed)
[facility]                            [facility] OFFICE NOTE   Timothy Fritz, Timothy Fritz                     MRN:          [id number]  DATE:11/15/2005                            DOB:          15-Oct-1940    REASON FOR VISIT:  Routine followup.   HISTORY OF PRESENT ILLNESS:  Timothy Fritz continues to do well with history  of fusiform aortic aneurysm and bicuspid aortic valve status post Bentall  procedure  in 2002.  He had a followup echocardiogram after our last visit  in August of 2006 which suggested mild to moderate aortic regurgitation as  well as a small mobile echogenic target of uncertain etiology in left  ventricular outflow tract.  Dr. Candee Furbish proceeded with a transesophageal  echocardiogram to better define this and found no evidence of vegetation or  clear problem with the aortic prosthesis.  There was trivial aortic  regurgitation some of which was perivalvular and the echogenic density seen  on transthoracic study was felt to be a loose portion of the mitral valve  cordal apparatus without any frank rupture or mitral insufficiency.  Symptomatically, Timothy Fritz is not having any progressive dyspnea on  exertion and no anginal chest pain.  He exercises at the [facility] with both the  treadmill and the stairstep machine without difficulty.  He has been  tolerating his medicines.  Coumadin has been followed at the Coumadin clinic  and he has not had any bleeding problems.  He continues on low-dose  amiodarone.  He tells me that his lipids are being followed by Dr. Sherryll Burger and  there has been no major medication adjustments.   ALLERGIES:  No known drug allergies.   PRESENT MEDICATIONS:  Coumadin as directed by the Coumadin clinic, Toprol XL  75 mg p.o. b.i.d., Amiodarone 100 mg p.o. daily, Lipitor 10 mg p.o. daily,  fish oil supplements, Ultram p.r.n.   REVIEW OF SYSTEMS:  As in history of present illness.   PHYSICAL EXAMINATION:  Blood  pressure today is 122/62 , heart rate is 60 and  regular, weight is 182 pounds.  The patient is comfortable and in no acute  distress.  HEENT:  Conjunctiva looked normal.  Pharynx clear.  NECK:  Supple, __________ pressure without bruits.  LUNGS:  Clear without labored breathing.  CARDIAC EXAM:  Reveals a regular rate and rhythm with a crisp aortic valve  prosthetic click and no audible diastolic murmur.  There is no S3 gallop.  ABDOMEN:  Soft without bruits.  EXTREMITIES:  Show no pitting edema.  SKIN:  Tone is normal in appearance.   Electrocardiogram today shows sinus bradycardia at 48 beats per minute with  left ventricular hypertrophy.   IMPRESSION AND RECOMMENDATIONS:  1. History of fusiform aortic aneurysm with bicuspid aortic valve, status      post Bentall procedure in 2002.  The patient is stable symptomatically      and had a reassuring followup transesophageal echocardiogram as      detailed above.  Will plan to continue Coumadin with surveillance in      the Coumadin clinic and  I will also plan a followup liver function      tests and thyroid study for Amiodarone surveillance.  He has a history      of paroxysmal atrial fibrillation with infrequent brief palpitations      which we will continue to follow.  He remains in sinus rhythm today.  I      will plan to see him back in the next 6 months for symptom review.  2. Hyperlipidemia on Statin therapy.  This is followed by Dr. Sherryll Burger.       [name], MD     SGM/MedQ  DD:  11/15/2005  DT:  11/16/2005  Job #:  [id number]   cc:   [name], MD

## 2010-07-02 NOTE — Cardiovascular Report (Signed)
[place]  Patient:    Timothy Fritz, Timothy Fritz                       MRN: [id number] Proc. Date: 09/06/00 Attending:  Arturo Morton. Riley Kill, M.D. [facility] CC:         Lewayne Bunting, M.D.  [name], M.D. Valda Favia, M.D.  CV Laboratory   Cardiac Catheterization  INDICATIONS:  Mr. Maffei is a delightful 70 year old who presents for preoperative cardiac catheterization.  He has undergone echocardiography which demonstrated a bicuspid aortic valve.  In addition, the patient has a proximal aortic root aneurysm.  This has been monitored by CT scanning.  The current study was done as a preoperative study.  The patient has incidental gallstones.  Risks, benefits, and alternatives were discussed with the patient.  Because he lacks significant aortic stenosis, a right catheterization was not performed.  PROCEDURE: 1. Left heart catheterization. 2. Selective coronary arteriography. 3. Selective left ventriculography. 4. Aortic root angiography.  CARDIOLOGIST:  Arturo Morton. Riley Kill, M.D. [facility]  DESCRIPTION OF THE PROCEDURE:  The procedure was performed from the right femoral artery using 6-French catheters.  Ventriculography and proximal root aortography were performed initially.  We were able to cross the valve without too much difficulty.  Coronary arteriography was then performed using a JL5 Judkins diagnostic catheter and a standard right coronary catheter.  There were no complication.  HEMODYNAMIC DATA: 1. Aortic root 112/64. 2. Left ventricle 117/14. 3. No aortic to left ventricular pressure gradient on pullback.  ANGIOGRAPHIC DATA: 1. Ventriculography was performed in the RAO projection.  Calculated    ejection fraction using the RAO technique was 56% with the area length    method.  No segmental abnormalities or contraction were identified.  There    was evidence of some fusion as well as some doming of the aortic leaflets. 2. Aortic root  angiography revealed 1+ aortic regurgitation.  There was a    large ascending aneurysm extending from just above the valve on up to the    subclavian takeoff.  No obvious dissection was noted.  A doming valve was    noted. 3. There was evidence of calcification on the aortic leaflets. 4. The left main coronary artery was free of critical disease. 5. The left anterior descending artery coursed to the apex.  There was mild    aneurysmal dilatation in the mid vessel.  No high-grade areas of focal    stenosis were noted. 6. The circumflex provided two marginal branches.  The first one had a very    close takeoff from the proximal or ostium of the circumflex, and this    vessel had slight eccentric plaquing that appeared to be 20% or less.    Likewise, the first marginal branch also had some very mild eccentric    plaquing that was 20% or less.  No high-grade areas of focal narrowing were    noted. 7. The right coronary artery had minimal tapering of the ostium but no    significant focal abnormalities.  The PDA and posterolateral branch were    free of critical disease.  CONCLUSIONS: 1. Preserved overall left ventricular function. 2. Large thoracic aortic aneurysm above a bicuspid aortic valve with evidence    of 1+ aortic regurgitation. 3. No high-grade focal coronary stenoses.  DISPOSITION:  Plan per Dr. Andee Lineman and Dr. Laneta Simmers as has been previously outlined in the preoperative evaluation and per their previous  evaluation. DD:  09/06/00 TD:  09/06/00 Job: [id number]

## 2010-10-25 ENCOUNTER — Other Ambulatory Visit: Payer: Self-pay | Admitting: Cardiology

## 2010-10-25 ENCOUNTER — Other Ambulatory Visit: Payer: Self-pay | Admitting: *Deleted

## 2010-10-25 DIAGNOSIS — Z79899 Other long term (current) drug therapy: Secondary | ICD-10-CM

## 2010-10-25 DIAGNOSIS — I4891 Unspecified atrial fibrillation: Secondary | ICD-10-CM

## 2010-10-25 DIAGNOSIS — E782 Mixed hyperlipidemia: Secondary | ICD-10-CM

## 2010-10-25 DIAGNOSIS — Z954 Presence of other heart-valve replacement: Secondary | ICD-10-CM

## 2010-10-25 DIAGNOSIS — I429 Cardiomyopathy, unspecified: Secondary | ICD-10-CM

## 2010-11-09 ENCOUNTER — Telehealth: Payer: Self-pay | Admitting: *Deleted

## 2010-11-09 NOTE — Telephone Encounter (Signed)
Nurse called and left message on patient's voicemail that he needed to contact [facility] and have them contact Dr. Sherryll Burger office r/e refill request for warfarin since their office manages this medication.

## 2010-12-03 ENCOUNTER — Encounter: Payer: Self-pay | Admitting: Cardiology

## 2010-12-08 ENCOUNTER — Other Ambulatory Visit (INDEPENDENT_AMBULATORY_CARE_PROVIDER_SITE_OTHER): Payer: Medicare PPO | Admitting: *Deleted

## 2010-12-08 DIAGNOSIS — I4891 Unspecified atrial fibrillation: Secondary | ICD-10-CM

## 2010-12-08 DIAGNOSIS — Z954 Presence of other heart-valve replacement: Secondary | ICD-10-CM

## 2010-12-08 DIAGNOSIS — I429 Cardiomyopathy, unspecified: Secondary | ICD-10-CM

## 2010-12-09 ENCOUNTER — Telehealth: Payer: Self-pay | Admitting: *Deleted

## 2010-12-09 NOTE — Telephone Encounter (Signed)
Pt's 10/26 OV was r/s to Dec 12/4. Pt will be seen in [place] on 10/31to discuss results.

## 2010-12-09 NOTE — Telephone Encounter (Signed)
Message copied by Arlyss Gandy on Thu Dec 09, 2010  2:49 PM ------      Message from: MCDOWELL, Illene Bolus      Created: Thu Dec 09, 2010  9:02 AM       Reviewed report. LVEF has declined further compared to prior assessment, although aortic prosthesis is stable. Will need to discuss this further with him in followup, and review his medications.

## 2010-12-10 ENCOUNTER — Ambulatory Visit: Payer: Medicare PPO | Admitting: Cardiology

## 2010-12-15 ENCOUNTER — Encounter: Payer: Self-pay | Admitting: *Deleted

## 2010-12-15 ENCOUNTER — Ambulatory Visit (INDEPENDENT_AMBULATORY_CARE_PROVIDER_SITE_OTHER): Payer: Medicare PPO | Admitting: Cardiology

## 2010-12-15 ENCOUNTER — Encounter: Payer: Self-pay | Admitting: Cardiology

## 2010-12-15 VITALS — BP 129/69 | HR 70 | Resp 18 | Ht 68.0 in | Wt 196.0 lb

## 2010-12-15 DIAGNOSIS — Z954 Presence of other heart-valve replacement: Secondary | ICD-10-CM

## 2010-12-15 DIAGNOSIS — I10 Essential (primary) hypertension: Secondary | ICD-10-CM

## 2010-12-15 DIAGNOSIS — I429 Cardiomyopathy, unspecified: Secondary | ICD-10-CM

## 2010-12-15 DIAGNOSIS — I359 Nonrheumatic aortic valve disorder, unspecified: Secondary | ICD-10-CM

## 2010-12-15 DIAGNOSIS — I2510 Atherosclerotic heart disease of native coronary artery without angina pectoris: Secondary | ICD-10-CM

## 2010-12-15 DIAGNOSIS — I4891 Unspecified atrial fibrillation: Secondary | ICD-10-CM

## 2010-12-15 NOTE — Assessment & Plan Note (Signed)
Nonobstructive at cardiac catheterization 2002, no definite ischemia on noninvasive imaging in 2010. Patient underwent reimplantation of coronaries with Bentall procedure in the setting of aortic aneurysm repair with aortic mechanical prosthesis.

## 2010-12-15 NOTE — Progress Notes (Signed)
Clinical Summary Timothy Fritz is a 70 y.o.male presenting for followup. He was seen back in March. Recent followup echocardiogram was obtained showing stable aortic prosthesis, however reduction in LVEF to the range of 25-30% with global hypokinesis. Mild mitral regurgitation was noted and there was moderate to severe left atrial chamber enlargement. RV function was mildly reduced with mild to moderate tricuspid regurgitation. We discussed this today.  Despite this change, he reports remaining symptomatically stable, still exercising regularly with NYHA class II dyspnea on exertion. He reports palpitations on a daily basis, presumably atrial fibrillation as this has been a recurrent problem despite amiodarone. He denies any anginal chest pain. No syncope.  He reports compliance with his medications. Denies any fevers or chills, unexplained illnesses. I reviewed his alcohol use history, and he states he drinks wine on a regular basis, sometimes "5 or 6 glasses" in a day. We discussed cutting back alcohol use.   No Known Allergies  Medication list reviewed.  Past Medical History  Diagnosis Date  . Atrial fibrillation     Paroyxsmal atrial fibrillation and atrial flutter  . Coronary artery disease     Nonobstructive at cardiac catherization 2002  . Mixed hyperlipidemia   . Essential hypertension, benign   . Left bundle branch block   . BPH (benign prostatic hypertrophy)   . Cardiomyopathy     Nonischemic (EF 45%)  . Aortic valve, bicuspid     Mechanical AVR  . Aortic aneurysm, thoracic     Fusiform s/p Bentall procedure 2002    Past Surgical History  Procedure Date  . Bentall procedure     [facility], 2002  . Tonsillectomy     Family History  Problem Relation Age of Onset  . Coronary artery disease Father     Social History Timothy Fritz reports that he quit smoking about 10 years ago. His smoking use included Cigarettes. He has never used smokeless tobacco. Timothy Fritz reports that  he drinks alcohol.  Review of Systems As outlined above, otherwise negative.  Physical Examination Filed Vitals:   12/15/10 1324  BP: 129/69  Pulse: 70  Resp: 18    Overweight male in no acute distress.  HEENT: Conjunctiva and lids normal, oropharynx clear.  Neck: Supple, no carotid bruits, no thyromegaly.  Lungs: Clear to auscultation, nonlabored.  Cardiac:RRR, crisp prosthetic mechanical aortic valve click, no diastolic murmur, no S3 gallop.  Abdomen: Soft, nontender, no hepatomegaly, no bruits.  Extremities: No significant pitting edema, few small varicosities. distal pulses are 2+.  Skin: Warm and dry.  Musculoskeletal: No kyphosis.  Neuropsychiatric: Alert oriented x3, appropriate.   Problem List and Plan

## 2010-12-15 NOTE — Assessment & Plan Note (Signed)
Continues on amiodarone and Coumadin. 

## 2010-12-15 NOTE — Patient Instructions (Signed)
Your physician recommends that you schedule a follow-up appointment in: 3 weeks with Dr Diona Browner  Your physician has recommended that you wear a holter monitor. Holter monitors are medical devices that record the heart's electrical activity. Doctors most often use these monitors to diagnose arrhythmias. Arrhythmias are problems with the speed or rhythm of the heartbeat. The monitor is a small, portable device. You can wear one while you do your normal daily activities. This is usually used to diagnose what is causing palpitations/syncope (passing out).  Your physician has requested that you have a lexiscan myoview. For further information please visit [email or web address] Please follow instruction sheet, as given.

## 2010-12-15 NOTE — Assessment & Plan Note (Signed)
Reduction in LVEF to the range of 25-30%. Symptomatically however, he remains stable. Question is of etiology. He has prior history of only nonobstructive CAD, and progressive disease would be a consideration. Recurrent atrial fibrillation with a tachycardia mediated cardiomyopathy is also possible. We furthermore discussed reducing his alcohol intake as this may be related. Our plan is to proceed with a Lexiscan Myoview on medical therapy for general assessment of ischemia, reduce alcohol intake, and provide a 48-hour Holter monitor to assess rhythm and heart rate variability. I will have him return for review.

## 2010-12-15 NOTE — Assessment & Plan Note (Signed)
History of bicuspid aortic valve, mechanical prosthesis with stable function.

## 2010-12-15 NOTE — Assessment & Plan Note (Signed)
Blood pressure is reasonably well controlled today. 

## 2010-12-16 ENCOUNTER — Other Ambulatory Visit: Payer: Self-pay | Admitting: Cardiology

## 2010-12-22 ENCOUNTER — Ambulatory Visit (INDEPENDENT_AMBULATORY_CARE_PROVIDER_SITE_OTHER): Payer: Medicare HMO | Admitting: *Deleted

## 2010-12-22 ENCOUNTER — Encounter (HOSPITAL_COMMUNITY)
Admission: RE | Admit: 2010-12-22 | Discharge: 2010-12-22 | Disposition: A | Discharge status: Home / Self Care | Payer: Medicare HMO | Source: Ambulatory Visit | Attending: Cardiology | Admitting: Cardiology

## 2010-12-22 ENCOUNTER — Encounter (HOSPITAL_COMMUNITY): Payer: Self-pay

## 2010-12-22 ENCOUNTER — Ambulatory Visit (HOSPITAL_COMMUNITY)
Admission: RE | Admit: 2010-12-22 | Discharge: 2010-12-22 | Disposition: A | Discharge status: Home / Self Care | Payer: Medicare HMO | Source: Ambulatory Visit | Attending: Cardiology | Admitting: Cardiology

## 2010-12-22 ENCOUNTER — Encounter (HOSPITAL_COMMUNITY): Payer: Self-pay | Admitting: Cardiology

## 2010-12-22 DIAGNOSIS — Z954 Presence of other heart-valve replacement: Secondary | ICD-10-CM | POA: Insufficient documentation

## 2010-12-22 DIAGNOSIS — Z952 Presence of prosthetic heart valve: Secondary | ICD-10-CM | POA: Insufficient documentation

## 2010-12-22 DIAGNOSIS — I428 Other cardiomyopathies: Secondary | ICD-10-CM | POA: Insufficient documentation

## 2010-12-22 DIAGNOSIS — I10 Essential (primary) hypertension: Secondary | ICD-10-CM | POA: Insufficient documentation

## 2010-12-22 DIAGNOSIS — I2510 Atherosclerotic heart disease of native coronary artery without angina pectoris: Secondary | ICD-10-CM

## 2010-12-22 DIAGNOSIS — I4891 Unspecified atrial fibrillation: Secondary | ICD-10-CM

## 2010-12-22 MED ORDER — TECHNETIUM TC 99M TETROFOSMIN IV KIT
10.0000 | PACK | Freq: Once | INTRAVENOUS | Status: AC | PRN
  Administered 2010-12-22: 10.5 via INTRAVENOUS

## 2010-12-22 MED ORDER — TECHNETIUM TC 99M TETROFOSMIN IV KIT
30.0000 | PACK | Freq: Once | INTRAVENOUS | Status: AC | PRN
  Administered 2010-12-22: 29.6 via INTRAVENOUS

## 2010-12-22 NOTE — Progress Notes (Signed)
*  PRELIMINARY RESULTS* Echocardiogram 48H Holter monitor has been performed.  Timothy Fritz 12/22/2010, 1:05 [id number]

## 2010-12-22 NOTE — Progress Notes (Signed)
Stress Lab Nurses Notes - [facility]  AVETT REINECK 12/22/2010  Reason for doing test: CAD  Type of test: Steffanie Dunn  Nurse performing test: Parke Poisson, RN  Nuclear Medicine Tech: Lyndel Pleasure  Echo Tech: Not Applicable  MD performing test: R. Rothbart  Family MD: Sherryll Burger  Test explained and consent signed: yes  IV started: 22g jelco, Saline lock flushed, No redness or edema and Saline lock started in radiology  Symptoms: SOB and headache  Treatment/Intervention: None  Reason test stopped: protocol completed  After recovery IV was: Discontinued via X-ray tech and No redness or edema  Patient to return to Nuc. Med at : 12:00 noon  Patient discharged: Home  Patient's Condition upon discharge was: stable  Comments: During test BP 128/80 & HR 82.  Recovery BP 108/68 & HR 80.  Symptoms resolved in recovery.    Erskine Speed T

## 2011-01-02 NOTE — Procedures (Signed)
NAMEJESSTIN, STUDSTILL              ACCOUNT NO.:  [id number]  MEDICAL RECORD NO.:  [id number]  LOCATION:                                 FACILITY:  PHYSICIAN:  Gerrit Friends. Dietrich Pates, MD, FACCDATE OF BIRTH:  November 18, 1940  DATE OF PROCEDURE:  12/24/2010 DATE OF DISCHARGE:                               HOLTER MONITOR   DATE OF STUDY:  November 7 and December 24, 2010.  REFERRING PHYSICIAN:  [name], MD  CLINICAL DATA:  A 70 year old gentleman with atrial fibrillation. 1. Continuous electrocardiographic recording was maintained for 48     hours during which time the predominant rhythm was atrial     fibrillation with an IVCD and good control of heart rate.  There     was no sustained periods of bradycardia with rates below 50 and     peak heart rate was 110 BPM.  Average heart rate over the interval     was 72 BPM.  No pauses greater than 2 seconds were identified. 2. No other arrhythmias were noted.  There were no PVCs. 3. ST segments were uninterpretable due to the presence of an IVCD;     however, no significant changes from baseline were identified. 4. Complete diary of activity was returned, but no symptoms were     reported.     Gerrit Friends. Dietrich Pates, MD, [facility]     RMR/MEDQ  D:  01/01/2011  T:  01/01/2011  Job:  [id number]

## 2011-01-03 ENCOUNTER — Encounter: Payer: Self-pay | Admitting: Cardiology

## 2011-01-03 ENCOUNTER — Ambulatory Visit (INDEPENDENT_AMBULATORY_CARE_PROVIDER_SITE_OTHER): Payer: Medicare HMO | Admitting: Cardiology

## 2011-01-03 VITALS — BP 142/68 | HR 54 | Resp 18 | Ht 68.0 in | Wt 199.0 lb

## 2011-01-03 DIAGNOSIS — I429 Cardiomyopathy, unspecified: Secondary | ICD-10-CM

## 2011-01-03 DIAGNOSIS — I4891 Unspecified atrial fibrillation: Secondary | ICD-10-CM

## 2011-01-03 DIAGNOSIS — I359 Nonrheumatic aortic valve disorder, unspecified: Secondary | ICD-10-CM

## 2011-01-03 MED ORDER — RAMIPRIL 2.5 MG PO CAPS: 2.5000 mg | ORAL_CAPSULE | Freq: Every day | ORAL | Status: DC

## 2011-01-03 NOTE — Progress Notes (Signed)
Clinical Summary Mr. Reister is a 70 y.o.male presenting for followup. He was seen in October with discussion of reduction in LVEF over time, most recently 25-30%. We discussed reduction in alcohol intake as well as followup testing. Holter monitor showed atrial fibrillation with adequate heart rate control. Myoview showed anteroseptal and inferoseptal wall motion abnormalities, no ischemia, and LVEF 34%. We reviewed these today.  He reports still feeling OK, exercising. Medical therapy was reviewed. Discussed adjustments in therapy and possibility for other invasive testing, although did not feel strongly about pursuing cardiac catheterization at this time.  No Known Allergies  Medication list reviewed.  Past Medical History  Diagnosis Date  . Atrial fibrillation     Paroyxsmal atrial fibrillation and atrial flutter  . Coronary artery disease     Nonobstructive at cardiac catherization 2002  . Mixed hyperlipidemia   . Essential hypertension, benign   . Left bundle branch block   . BPH (benign prostatic hypertrophy)   . Cardiomyopathy     Nonischemic (EF 45%)  . Aortic valve, bicuspid     Mechanical AVR  . Aortic aneurysm, thoracic     Fusiform s/p Bentall procedure 2002  . Cancer     Past Surgical History  Procedure Date  . Bentall procedure     [facility], 2002  . Tonsillectomy     Family History  Problem Relation Age of Onset  . Coronary artery disease Father     Social History Mr. Somers reports that he quit smoking about 10 years ago. His smoking use included Cigarettes. He has never used smokeless tobacco. Mr. Gleaves reports that he drinks alcohol.  Review of Systems Negative except as outlined.  Physical Examination Filed Vitals:   01/03/11 1434  BP: 142/68  Pulse: 54  Resp: 18   Overweight male in no acute distress.  HEENT: Conjunctiva and lids normal, oropharynx clear.  Neck: Supple, no carotid bruits, no thyromegaly.  Lungs: Clear to auscultation,  nonlabored.  Cardiac:RRR, crisp prosthetic mechanical aortic valve click, no diastolic murmur, no S3 gallop.  Abdomen: Soft, nontender, no hepatomegaly, no bruits.  Extremities: No significant pitting edema, few small varicosities. distal pulses are 2+.  Skin: Warm and dry.  Musculoskeletal: No kyphosis.  Neuropsychiatric: Alert oriented x3, appropriate.     Problem List and Plan

## 2011-01-03 NOTE — Assessment & Plan Note (Signed)
Stable, continues on Coumadin.

## 2011-01-03 NOTE — Patient Instructions (Signed)
Your physician has recommended you make the following change in your medication: increase 2.5 mg daily   Your physician recommends that you return for lab work in: 2 weeks  Your physician recommends that you schedule a follow-up appointment in: 2 months

## 2011-01-03 NOTE — Assessment & Plan Note (Signed)
Continue current dose of Toprol XL and Coumadin treatment.

## 2011-01-03 NOTE — Assessment & Plan Note (Signed)
No ischemia by Myoview. Fixed defects do not exclude possible infarct scar, but no previous obstructive CAD noted. LVEF 34%. For now will plan further optimization of medical therapy, adding Altace 2.5 mg daily now. BMET in 2 weeks. Alcohol intake reduction already discussed. No definite evidence of tachycardia mediated cardiomyopathy based on heart rate control by Holter. Followup arranged.

## 2011-01-18 ENCOUNTER — Ambulatory Visit: Payer: Medicare PPO | Admitting: Cardiology

## 2011-02-03 ENCOUNTER — Other Ambulatory Visit: Payer: Self-pay | Admitting: *Deleted

## 2011-02-03 MED ORDER — AMIODARONE HCL 200 MG PO TABS: 200.0000 mg | ORAL_TABLET | Freq: Every day | ORAL | Status: DC

## 2011-02-09 ENCOUNTER — Other Ambulatory Visit: Payer: Self-pay | Admitting: *Deleted

## 2011-03-07 ENCOUNTER — Encounter: Payer: Self-pay | Admitting: Cardiology

## 2011-03-07 ENCOUNTER — Ambulatory Visit (INDEPENDENT_AMBULATORY_CARE_PROVIDER_SITE_OTHER): Payer: Medicare HMO | Admitting: Cardiology

## 2011-03-07 VITALS — BP 120/64 | HR 49 | Resp 16 | Ht 68.0 in | Wt 193.0 lb

## 2011-03-07 DIAGNOSIS — I429 Cardiomyopathy, unspecified: Secondary | ICD-10-CM

## 2011-03-07 DIAGNOSIS — I4891 Unspecified atrial fibrillation: Secondary | ICD-10-CM

## 2011-03-07 NOTE — Assessment & Plan Note (Signed)
Plan to continue medical therapy for now and arrange followup echocardiogram just prior to next visit. Continue diet and exercise. Should let me know if experiences any functional decline in interim.

## 2011-03-07 NOTE — Patient Instructions (Signed)
Your physician has requested that you have an echocardiogram. Echocardiography is a painless test that uses sound waves to create images of your heart. It provides your doctor with information about the size and shape of your heart and how well your heart's chambers and valves are working. This procedure takes approximately one hour. There are no restrictions for this procedure.  Your physician recommends that you schedule a follow-up appointment in: 3 months in [place] office

## 2011-03-07 NOTE — Progress Notes (Signed)
   Clinical Summary Timothy Fritz is a 71 y.o.male presenting for followup. He was seen in November. He continues to do well, walking 2 miles at a time, NYHA class II dyspnea. No angina and he reports fewer palpitations.  Followup labwork from 12/4 showed BUN 20, creatinine 1.1, and potassium 4.7. We reviewed these.  He states that he has tolerated Altace. We discussed followup echocardiogram prior to next visit.   No Known Allergies  Current Outpatient Prescriptions  Medication Sig Dispense Refill  . amiodarone (PACERONE) 200 MG tablet Take 1 tablet (200 mg total) by mouth daily.  90 tablet  3  . atorvastatin (LIPITOR) 20 MG tablet Take 1 tablet (20 mg total) by mouth daily. Patient uses [facility]  90 tablet  3  . metoprolol (TOPROL XL) 50 MG 24 hr tablet Take 1&1/2 by mouth twice daily        . Multiple Vitamin (MULTIVITAMIN PO) Take one by mouth daily        . Omega-3 Fatty Acids (FISH OIL) 1000 MG CAPS Take one by mouth twice daily         . ramipril (ALTACE) 2.5 MG capsule Take 1 capsule (2.5 mg total) by mouth daily.  90 capsule  0  . Tamsulosin HCl (FLOMAX) 0.4 MG CAPS Take one by mouth daily        . warfarin (COUMADIN) 5 MG tablet Use as directed          Past Medical History  Diagnosis Date  . Atrial fibrillation     Paroyxsmal atrial fibrillation and atrial flutter  . Coronary artery disease     Nonobstructive at cardiac catherization 2002  . Mixed hyperlipidemia   . Essential hypertension, benign   . Left bundle branch block   . BPH (benign prostatic hypertrophy)   . Cardiomyopathy     Nonischemic (EF 45%)  . Aortic valve, bicuspid     Mechanical AVR  . Aortic aneurysm, thoracic     Fusiform s/p Bentall procedure 2002  . Cancer     Past Surgical History  Procedure Date  . Bentall procedure     [facility], 2002  . Tonsillectomy     Family History  Problem Relation Age of Onset  . Coronary artery disease Father     Social History Mr.  Brame reports that he quit smoking about 11 years ago. His smoking use included Cigarettes. He has never used smokeless tobacco. Mr. Harrison reports that he drinks alcohol.  Review of Systems Negative except as outlined above.  Physical Examination Filed Vitals:   03/07/11 1401  BP: 120/64  Pulse: 49  Resp: 16   Overweight male in no acute distress.  HEENT: Conjunctiva and lids normal, oropharynx clear.  Neck: Supple, no carotid bruits, no thyromegaly.  Lungs: Clear to auscultation, nonlabored.  Cardiac:RRR, crisp prosthetic mechanical aortic valve click, no diastolic murmur, no S3 gallop.  Abdomen: Soft, nontender, no hepatomegaly, no bruits.  Extremities: No significant pitting edema, few small varicosities. distal pulses are 2+.     Problem List and Plan

## 2011-03-07 NOTE — Assessment & Plan Note (Signed)
Continue current medical therapy 

## 2011-03-08 ENCOUNTER — Telehealth: Payer: Self-pay | Admitting: Cardiology

## 2011-03-08 NOTE — Telephone Encounter (Signed)
Called Timothy Fritz home today and left a message asking him to contact our office so that we may set up 2 D ECHO Requested by Dr. Diona Browner.

## 2011-03-31 ENCOUNTER — Other Ambulatory Visit: Payer: Self-pay

## 2011-03-31 ENCOUNTER — Other Ambulatory Visit (INDEPENDENT_AMBULATORY_CARE_PROVIDER_SITE_OTHER): Payer: Medicare HMO | Admitting: *Deleted

## 2011-03-31 DIAGNOSIS — R0989 Other specified symptoms and signs involving the circulatory and respiratory systems: Secondary | ICD-10-CM

## 2011-03-31 DIAGNOSIS — I428 Other cardiomyopathies: Secondary | ICD-10-CM

## 2011-03-31 DIAGNOSIS — R0609 Other forms of dyspnea: Secondary | ICD-10-CM

## 2011-03-31 DIAGNOSIS — I429 Cardiomyopathy, unspecified: Secondary | ICD-10-CM

## 2011-04-12 ENCOUNTER — Telehealth: Payer: Self-pay | Admitting: Cardiology

## 2011-04-12 MED ORDER — RAMIPRIL 2.5 MG PO CAPS: 2.5000 mg | ORAL_CAPSULE | Freq: Every day | ORAL | Status: DC

## 2011-04-12 NOTE — Telephone Encounter (Signed)
PATIENT RUNNING LOW ON THIS MEDICATION.  NEEDS TO KNOW IF HE IS TO  CONTINUE, IF SO CALL INTO [facility].

## 2011-06-29 ENCOUNTER — Other Ambulatory Visit: Payer: Self-pay | Admitting: Cardiology

## 2011-07-07 ENCOUNTER — Other Ambulatory Visit: Payer: Self-pay | Admitting: Cardiology

## 2011-07-07 MED ORDER — ATORVASTATIN CALCIUM 20 MG PO TABS: 20.0000 mg | ORAL_TABLET | Freq: Every day | ORAL | Status: DC

## 2011-07-12 ENCOUNTER — Telehealth: Payer: Self-pay | Admitting: *Deleted

## 2011-07-12 NOTE — Telephone Encounter (Signed)
Message left on nurse's voicemail that rx for lipitor hasn't been sent to [facility] after being requested. Nurse called and informed patient via voicemail that rx was sent but we re-faxed it today and also that he needed an office visit with Diona Browner and should call office and schedule appointment.

## 2011-09-23 ENCOUNTER — Encounter: Payer: Self-pay | Admitting: Cardiology

## 2011-09-23 ENCOUNTER — Ambulatory Visit (INDEPENDENT_AMBULATORY_CARE_PROVIDER_SITE_OTHER): Payer: Medicare HMO | Admitting: Cardiology

## 2011-09-23 VITALS — BP 155/80 | HR 48 | Ht 68.0 in | Wt 189.4 lb

## 2011-09-23 DIAGNOSIS — Z954 Presence of other heart-valve replacement: Secondary | ICD-10-CM

## 2011-09-23 DIAGNOSIS — I429 Cardiomyopathy, unspecified: Secondary | ICD-10-CM

## 2011-09-23 DIAGNOSIS — I4891 Unspecified atrial fibrillation: Secondary | ICD-10-CM

## 2011-09-23 DIAGNOSIS — I2510 Atherosclerotic heart disease of native coronary artery without angina pectoris: Secondary | ICD-10-CM

## 2011-09-23 MED ORDER — LOSARTAN POTASSIUM 50 MG PO TABS: 50.0000 mg | ORAL_TABLET | Freq: Every day | ORAL | Status: DC

## 2011-09-23 NOTE — Assessment & Plan Note (Signed)
Nonischemic, LVEF approximately 35%. Symptomatically he feels better over the last 6 months on medical therapy. Plan to discontinue Altace and start Cozaar to see if this helps with his cough. Keep an eye on blood pressure control as well.

## 2011-09-23 NOTE — Assessment & Plan Note (Signed)
Maintaining sinus rhythm on amiodarone and Coumadin. Thyroid and liver function have been normal over time. Will recheck for next visit.

## 2011-09-23 NOTE — Progress Notes (Signed)
Clinical Summary Timothy Fritz is a 71 y.o.male presenting for followup. He was seen in January. He states that he is feeling fairly well, doing outdoor work without major limitation. No more significant palpitations, no chest pain.  Followup echocardiogram in February revealed LVEF of 30-35%, stable prosthesis, moderate mitral regurgitation. Previous Myoview had demonstrated no active ischemia with apparent scarring in the anterior, apical, inferoseptal, and basal septum, noted in the setting of left bundle branch block, LVEF 34%. His history has been of a primarily nonischemic cardiomyopathy and we have been managing him medically.  He has had trouble with a dry, relatively chronic cough. We talked about it possibly being related to the Altace. We discussed alternatives.   No Known Allergies  Current Outpatient Prescriptions  Medication Sig Dispense Refill  . amiodarone (PACERONE) 200 MG tablet Take 1 tablet (200 mg total) by mouth daily.  90 tablet  3  . atorvastatin (LIPITOR) 20 MG tablet Take 1 tablet (20 mg total) by mouth daily. Patient uses [facility]  90 tablet  0  . losartan (COZAAR) 50 MG tablet Take 1 tablet (50 mg total) by mouth daily.  90 tablet  1  . metoprolol (TOPROL XL) 50 MG 24 hr tablet Take 1&1/2 by mouth twice daily        . Multiple Vitamin (MULTIVITAMIN PO) Take one by mouth daily        . Omega-3 Fatty Acids (FISH OIL) 1000 MG CAPS Take one by mouth twice daily         . Tamsulosin HCl (FLOMAX) 0.4 MG CAPS Take one by mouth daily        . warfarin (COUMADIN) 5 MG tablet Use as directed, managed by Dr. Sherryll Burger      . DISCONTD: losartan (COZAAR) 50 MG tablet Take 50 mg by mouth daily.      Marland Kitchen DISCONTD: losartan (COZAAR) 50 MG tablet Take 1 tablet (50 mg total) by mouth daily.  30 tablet  0    Past Medical History  Diagnosis Date  . Atrial fibrillation     Paroyxsmal atrial fibrillation and atrial flutter  . Coronary artery disease    Nonobstructive at cardiac catherization 2002  . Mixed hyperlipidemia   . Essential hypertension, benign   . Left bundle branch block   . BPH (benign prostatic hypertrophy)   . Cardiomyopathy     Nonischemic (EF 45%)  . Aortic valve, bicuspid     Mechanical AVR  . Aortic aneurysm, thoracic     Fusiform s/p Bentall procedure 2002  . Cancer     Past Surgical History  Procedure Date  . Bentall procedure     [facility], 2002  . Tonsillectomy     Social History Timothy Fritz reports that he quit smoking about 11 years ago. His smoking use included Cigarettes. He has never used smokeless tobacco. Timothy Fritz reports that he drinks alcohol.  Review of Systems Negative except as outlined above.  Physical Examination Filed Vitals:   09/23/11 1047  BP: 155/80  Pulse: 48    Overweight male in no acute distress.  HEENT: Conjunctiva and lids normal, oropharynx clear.  Neck: Supple, no carotid bruits, no thyromegaly.  Lungs: Clear to auscultation, nonlabored.  Cardiac:RRR, crisp prosthetic mechanical aortic valve click, no diastolic murmur, no S3 gallop.  Abdomen: Soft, nontender, no hepatomegaly, no bruits.  Extremities: No significant pitting edema, few small varicosities. distal pulses are 2+.   Problem List and Plan   ATRIAL FIBRILLATION  Maintaining sinus rhythm on amiodarone and Coumadin. Thyroid and liver function have been normal over time. Will recheck for next visit.  UNSPECIFIED SECONDARY CARDIOMYOPATHY Nonischemic, LVEF approximately 35%. Symptomatically he feels better over the last 6 months on medical therapy. Plan to discontinue Altace and start Cozaar to see if this helps with his cough. Keep an eye on blood pressure control as well.  S/P aortic valve replacement with metallic valve Exam stable, valve function appropriate by last echocardiogram in February.  CORONARY ATHEROSCLEROSIS NATIVE CORONARY ARTERY History of nonobstructive disease, status post Bentall  procedure.    [name], M.D., F.A.C.C.

## 2011-09-23 NOTE — Assessment & Plan Note (Signed)
Exam stable, valve function appropriate by last echocardiogram in February.

## 2011-09-23 NOTE — Assessment & Plan Note (Signed)
History of nonobstructive disease, status post Bentall procedure.

## 2011-09-23 NOTE — Patient Instructions (Addendum)
Your physician recommends that you schedule a follow-up appointment in: 6 months with Dr. Diona Browner. You will receive a reminder letter in the mail in about 4 months reminding you to call and schedule your appointment. If you don't receive this letter, please contact our office.  Your physician has recommended you make the following change in your medication: stop ramipril(altace) and start losartan (cozaar) 50mg  daily. New prescriptions have been sent to [facility]. Your physician has requested that you have an echocardiogram in 6 months just before your next visit. Echocardiography is a painless test that uses sound waves to create images of your heart. It provides your doctor with information about the size and shape of your heart and how well your heart's chambers and valves are working. This procedure takes approximately one hour. There are no restrictions for this procedure.

## 2011-11-07 ENCOUNTER — Other Ambulatory Visit: Payer: Self-pay | Admitting: Cardiology

## 2011-11-07 MED ORDER — ATORVASTATIN CALCIUM 20 MG PO TABS: 20.0000 mg | ORAL_TABLET | Freq: Every day | ORAL | Status: DC

## 2012-01-03 ENCOUNTER — Telehealth: Payer: Self-pay | Admitting: Cardiology

## 2012-01-03 NOTE — Telephone Encounter (Signed)
Patient with mechanical AVR status post Bentall procedure, also PAF. Forwarding to Coumadin clinic. We will come up with recommendations and referred these to Dr. Sherryll Burger.

## 2012-01-03 NOTE — Telephone Encounter (Signed)
DR [facility] CALLED STATING THAT PATIENT NEEDS DENTAL WORK.  HE WOULD LIKE TO KNOW IF THE PATIENT NEEDS TO BE BRIDGE BEFORE THIS WORK IS DONE

## 2012-01-03 NOTE — Telephone Encounter (Signed)
Patient having one extraction by dentist and need to know if he need to come off coumadin for this. Patient was informed to call our office by Dr. Margaretmary Eddy office whom manages his coumadin.

## 2012-01-04 NOTE — Telephone Encounter (Signed)
Dr Sherryll Burger Our recommendation would be for dental extraction to be done on coumadin since it is only one tooth.  If dentist will not do procedure on coumadin then pt will need to bridged with lovenox prior to procedure. Vashti Hey RN  Recommendation faxed to Dr Sherryll Burger 01/04/12

## 2012-03-28 ENCOUNTER — Telehealth: Payer: Self-pay | Admitting: Cardiology

## 2012-03-28 NOTE — Telephone Encounter (Signed)
Patient was cancelled/rescheduled from Feb 14 due to weather.  I rescheduled him with Rozell Searing, PA on February 21 same day Dr. Diona Browner is here.  I also explained that I would have him on wait list so that if someone cancelled he would be offered their appointment.  He didn't feel this would benefit him.  Patient called back to explain that he does not consider this acceptable and that he wants a personal call from Dr. Diona Browner.

## 2012-03-28 NOTE — Telephone Encounter (Signed)
Left message for patient to call office.  

## 2012-03-30 ENCOUNTER — Ambulatory Visit: Payer: Self-pay | Admitting: Cardiology

## 2012-04-04 ENCOUNTER — Other Ambulatory Visit: Payer: Medicare HMO

## 2012-04-04 NOTE — Telephone Encounter (Signed)
Left message for patient to call office.  

## 2012-04-05 NOTE — Telephone Encounter (Signed)
Called and spoke with patient offering an appointment today with Diona Browner at 1:00 pm and patient said he couldn't come today and will just keep appointment on tomorrow.

## 2012-04-06 ENCOUNTER — Telehealth: Payer: Self-pay | Admitting: Cardiology

## 2012-04-06 ENCOUNTER — Encounter: Payer: Self-pay | Admitting: Cardiology

## 2012-04-06 ENCOUNTER — Other Ambulatory Visit: Payer: Self-pay | Admitting: Cardiology

## 2012-04-06 ENCOUNTER — Ambulatory Visit (INDEPENDENT_AMBULATORY_CARE_PROVIDER_SITE_OTHER): Payer: Medicare Other | Admitting: Cardiology

## 2012-04-06 ENCOUNTER — Encounter: Payer: Self-pay | Admitting: *Deleted

## 2012-04-06 ENCOUNTER — Ambulatory Visit: Payer: Self-pay | Admitting: Physician Assistant

## 2012-04-06 VITALS — BP 138/82 | HR 80 | Ht 68.0 in | Wt 199.4 lb

## 2012-04-06 DIAGNOSIS — E782 Mixed hyperlipidemia: Secondary | ICD-10-CM

## 2012-04-06 DIAGNOSIS — I429 Cardiomyopathy, unspecified: Secondary | ICD-10-CM

## 2012-04-06 DIAGNOSIS — I10 Essential (primary) hypertension: Secondary | ICD-10-CM

## 2012-04-06 DIAGNOSIS — I2510 Atherosclerotic heart disease of native coronary artery without angina pectoris: Secondary | ICD-10-CM

## 2012-04-06 DIAGNOSIS — I4891 Unspecified atrial fibrillation: Secondary | ICD-10-CM

## 2012-04-06 DIAGNOSIS — Z954 Presence of other heart-valve replacement: Secondary | ICD-10-CM

## 2012-04-06 MED ORDER — FUROSEMIDE 20 MG PO TABS: 20.0000 mg | ORAL_TABLET | Freq: Every day | ORAL | Status: DC

## 2012-04-06 MED ORDER — LOSARTAN POTASSIUM 50 MG PO TABS: 50.0000 mg | ORAL_TABLET | Freq: Every day | ORAL | Status: DC

## 2012-04-06 NOTE — Assessment & Plan Note (Addendum)
No change to current regimen. Refills given for losartan and Lasix.

## 2012-04-06 NOTE — Assessment & Plan Note (Signed)
He will need to come off of Coumadin for cardiac catheterization and be bridged with Lovenox afterwards. Will coordinate this with the Coumadin clinic after review of his recent outpatient echocardiogram.

## 2012-04-06 NOTE — Addendum Note (Signed)
Addended by: Eustace Moore on: 04/06/2012 04:09 PM   Modules accepted: Orders

## 2012-04-06 NOTE — Assessment & Plan Note (Signed)
Previously documented as nonobstructive in 2002 prior to [facility] procedure at [facility].

## 2012-04-06 NOTE — Assessment & Plan Note (Signed)
Continues on Lipitor. 

## 2012-04-06 NOTE — Progress Notes (Signed)
I was able to review records from [facility] in [place]. Mr. Timothy Fritz was admitted here back on 2/7 with chest discomfort and initially concern for pneumonia with shortness of breath. He had CT scan of his chest which did not demonstrate pulmonary embolus, showed bibasilar atelectasis, mediastinal lymphadenopathy, and asymptomatic cholelithiasis. Chest x-ray described cardiomegaly without infiltrates or effusions. Troponin T level was normal. He did undergo a followup echocardiogram demonstrating reduction in LVEF to 25% with mild LVH and mildly to moderately dilated chamber size, mild mitral regurgitation, stable mechanical aortic prosthesis with trace regurgitation, and mild to moderate tricuspid regurgitation with RVSP 30 mm mercury.  As documented in my visit note from today, Mr. Timothy Fritz and I discussed proceeding on with a cardiac catheterization, right and left heart, as an outpatient to clearly exclude any interval development of obstructive CAD. We will have Coumadin bridged per the Coumadin clinic in light of mechanical aVR and atrial arrhythmias. Following this we can consider referral to EP to discuss for device therapy.  [name], M.D., F.A.C.C.

## 2012-04-06 NOTE — Telephone Encounter (Signed)
PT HAS BLUE MCR NO PAC RQD FOR CATH

## 2012-04-06 NOTE — Progress Notes (Addendum)
Clinical Summary Timothy Fritz is a 72 y.o.male presenting for followup. He was last seen in August 2013.  Echocardiogram in February 2013 revealed LVEF of 30-35%, stable prosthesis, moderate mitral regurgitation. Previous Myoview had demonstrated no active ischemia with apparent scarring in the anterior, apical, inferoseptal, and basal septum, noted in the setting of left bundle branch block, LVEF 34%. His history has been of a primarily nonischemic cardiomyopathy and we have been managing him medically, with clinical stability.  He states that he was visiting his sister in [place] back in early February and developed an episode of apparent heart failure while fishing. States that symptoms of shortness of breath came on suddenly however. He reports being hospitalized, had an echocardiogram done showing further deterioration in LVEF by his account, does not sound as if he underwent any other ischemic evaluation. His Toprol-XL dose was decreased and he was placed on low-dose Lasix. He comes in today stating that he feels better in general. Still occasionally has a fullness in his chest.  ECG today shows coarse atrial fibrillation versus atypical flutter with controlled ventricular response.  Today we reviewed his previous testing, we discussed his recent hospitalization in [place] and plan to get the records. If in fact his LV has continued to deteriorate, next step will most likely be a cardiac catheterization and then referral to EP.  No Known Allergies  Current Outpatient Prescriptions  Medication Sig Dispense Refill  . amiodarone (PACERONE) 200 MG tablet Take 1 tablet (200 mg total) by mouth daily.  90 tablet  3  . atorvastatin (LIPITOR) 20 MG tablet Take 1 tablet (20 mg total) by mouth daily. Patient uses [facility]  90 tablet  3  . furosemide (LASIX) 20 MG tablet Take 1 tablet (20 mg total) by mouth daily.  90 tablet  3  . losartan (COZAAR) 50 MG tablet Take 1 tablet (50 mg  total) by mouth daily.  90 tablet  3  . metoprolol (TOPROL XL) 50 MG 24 hr tablet Take 50 mg by mouth 2 (two) times daily.       . Multiple Vitamin (MULTIVITAMIN PO) Take one by mouth daily        . Omega-3 Fatty Acids (FISH OIL) 1000 MG CAPS Take one by mouth twice daily         . Tamsulosin HCl (FLOMAX) 0.4 MG CAPS Take one by mouth daily        . warfarin (COUMADIN) 5 MG tablet Use as directed, managed by Dr. Sherryll Burger       No current facility-administered medications for this visit.    Past Medical History  Diagnosis Date  . Atrial fibrillation     Paroyxsmal atrial fibrillation and atrial flutter  . Coronary artery disease     Nonobstructive at cardiac catherization 2002  . Mixed hyperlipidemia   . Essential hypertension, benign   . Left bundle branch block   . BPH (benign prostatic hypertrophy)   . Cardiomyopathy     Nonischemic (EF 45%)  . Aortic valve, bicuspid     Mechanical AVR  . Aortic aneurysm, thoracic     Fusiform s/p Bentall procedure 2002  . Cancer     Past Surgical History  Procedure Laterality Date  . Bentall procedure      [facility], 2002  . Tonsillectomy      Social History [name] reports that he quit smoking about 12 years ago. His smoking use included Cigarettes. He smoked 0.00 packs per day.  He has never used smokeless tobacco. [name] reports that  drinks alcohol.  Review of Systems No orthopnea or leg edema. Reports NYHA class 2-3 dyspnea. No syncope. Stable appetite. No bleeding problems on Coumadin.  Physical Examination Filed Vitals:   04/06/12 1432  BP: 138/82  Pulse: 80   Filed Weights   04/06/12 1432  Weight: 199 lb 6.4 oz (90.447 kg)    Overweight male in no acute distress.  HEENT: Conjunctiva and lids normal, oropharynx clear.  Neck: Supple, no carotid bruits, no thyromegaly.  Lungs: Clear to auscultation, nonlabored.  Cardiac: Irregularly irregular, crisp prosthetic mechanical aortic valve click, no diastolic murmur, no S3  gallop.  Abdomen: Soft, nontender, no hepatomegaly, no bruits.  Extremities: No significant pitting edema, few small varicosities. distal pulses are 2+. Skin: Warm and dry. Musculoskeletal: No kyphosis. Neuropsychiatric: Alert and oriented x3, affect appropriate.  Problem List and Plan   UNSPECIFIED SECONDARY CARDIOMYOPATHY Patient has had a general deterioration in LV systolic function over time despite medical therapy modifications. He states he was recently hospitalized in [place] and it sounds as if his LVEF may have been down to 25%, records pending. He reports compliance with his medications. He has NYHA class II-III dyspnea, occasional fullness in his chest. I expect he will need an outpatient cardiac catheterization to clearly assess for any progressive obstructive CAD. Will also need referral to electrophysiology to discuss device therapies ultimately.  CORONARY ATHEROSCLEROSIS NATIVE CORONARY ARTERY Previously documented as nonobstructive in 2002 prior to Bentall procedure at [facility].  S/P aortic valve replacement with metallic valve He will need to come off of Coumadin for cardiac catheterization and be bridged with Lovenox afterwards. Will coordinate this with the Coumadin clinic after review of his recent outpatient echocardiogram.  ATRIAL FIBRILLATION Paroxysmal to persistent atrial fibrillation/flutter. He continues on Coumadin with mechanical aVR, on Toprol-XL and amiodarone as well.  ESSENTIAL HYPERTENSION, BENIGN No change to current regimen. Refills given for losartan and Lasix.  MIXED HYPERLIPIDEMIA Continues on Lipitor.    [name], M.D., F.A.C.C.

## 2012-04-06 NOTE — Assessment & Plan Note (Signed)
Paroxysmal to persistent atrial fibrillation/flutter. He continues on Coumadin with mechanical aVR, on Toprol-XL and amiodarone as well.

## 2012-04-06 NOTE — Assessment & Plan Note (Signed)
Patient has had a general deterioration in LV systolic function over time despite medical therapy modifications. He states he was recently hospitalized in [place] and it sounds as if his LVEF may have been down to 25%, records pending. He reports compliance with his medications. He has NYHA class II-III dyspnea, occasional fullness in his chest. I expect he will need an outpatient cardiac catheterization to clearly assess for any progressive obstructive CAD. Will also need referral to electrophysiology to discuss device therapies ultimately.

## 2012-04-06 NOTE — Telephone Encounter (Signed)
L&R heart cath JV lab with [facility] 04/16/12 @10 :30 am [id number] & CAD

## 2012-04-06 NOTE — Patient Instructions (Addendum)
Your physician recommends that you continue on your current medications as directed. Please refer to the Current Medication list given to you today. Refills sent today for lasix and losartan. We will contact you after Dr. Diona Browner reviews your records from your recent hospitalization. Please see Misty Stanley on 04/10/12 @2 :00 pm to discus lovenox bridging Your physician has requested that you have a cardiac catheterization. Cardiac catheterization is used to diagnose and/or treat various heart conditions. Doctors may recommend this procedure for a number of different reasons. The most common reason is to evaluate chest pain. Chest pain can be a symptom of coronary artery disease (CAD), and cardiac catheterization can show whether plaque is narrowing or blocking your heart's arteries. This procedure is also used to evaluate the valves, as well as measure the blood flow and oxygen levels in different parts of your heart. For further information please visit [email or web address] Please follow instruction sheet, as given. Your physician recommends that you return for lab work April 10, 2012 at [facility] for pre cath lab BMET,CBC,PT/INR.

## 2012-04-10 ENCOUNTER — Ambulatory Visit (INDEPENDENT_AMBULATORY_CARE_PROVIDER_SITE_OTHER): Payer: Medicare Other | Admitting: *Deleted

## 2012-04-10 ENCOUNTER — Ambulatory Visit: Payer: Medicare HMO | Admitting: Cardiology

## 2012-04-10 DIAGNOSIS — Z7901 Long term (current) use of anticoagulants: Secondary | ICD-10-CM | POA: Insufficient documentation

## 2012-04-10 DIAGNOSIS — Z954 Presence of other heart-valve replacement: Secondary | ICD-10-CM

## 2012-04-10 DIAGNOSIS — I4891 Unspecified atrial fibrillation: Secondary | ICD-10-CM

## 2012-04-10 LAB — POCT INR: INR: 2.9

## 2012-04-10 NOTE — Patient Instructions (Addendum)
2/25  Last dose of coumadin 2/26  No lovenox or coumadin 2/27  Lovenox 140mg  9am 2/28 Lovenox 140mg  9am 3/1  Lovenox 140mg  9am 3/2 Lovenox 140mg  9am 3/3  No Lovenox --Cardiac cath--Coumadin 5mg  pm 3/4  Lovenox 140mg  9am & coumadin 5mg  pm 3/5   Lovenox 140mg  9am & coumadin 5mg  pm 3/6   Lovenox 140mg  9am & coumadin 5mg  pm 3/7   Lovenox 140mg  9am   INR check @ 1:30pm

## 2012-04-13 ENCOUNTER — Ambulatory Visit (INDEPENDENT_AMBULATORY_CARE_PROVIDER_SITE_OTHER): Payer: Medicare Other | Admitting: *Deleted

## 2012-04-13 ENCOUNTER — Telehealth: Payer: Self-pay | Admitting: Cardiology

## 2012-04-13 DIAGNOSIS — Z954 Presence of other heart-valve replacement: Secondary | ICD-10-CM

## 2012-04-13 DIAGNOSIS — I4891 Unspecified atrial fibrillation: Secondary | ICD-10-CM

## 2012-04-13 DIAGNOSIS — Z7901 Long term (current) use of anticoagulants: Secondary | ICD-10-CM

## 2012-04-13 LAB — POCT INR: INR: 2.8

## 2012-04-13 NOTE — Telephone Encounter (Signed)
Left two message this morning on my voicemail reference not being able to come for appt today. Would like a call back to let him know what he needs to do

## 2012-04-13 NOTE — Telephone Encounter (Signed)
Left message on home and cell phone for patient to call office.

## 2012-04-13 NOTE — Telephone Encounter (Signed)
Patient informed and will come by the office now for INR check.

## 2012-04-16 ENCOUNTER — Inpatient Hospital Stay (HOSPITAL_BASED_OUTPATIENT_CLINIC_OR_DEPARTMENT_OTHER)
Admission: RE | Admit: 2012-04-16 | Discharge: 2012-04-16 | Disposition: A | Discharge status: Home / Self Care | Payer: Medicare Other | Source: Ambulatory Visit | Attending: Cardiovascular Disease | Admitting: Cardiovascular Disease

## 2012-04-16 ENCOUNTER — Encounter (INDEPENDENT_AMBULATORY_CARE_PROVIDER_SITE_OTHER): Payer: Medicare Other | Admitting: *Deleted

## 2012-04-16 ENCOUNTER — Encounter (HOSPITAL_BASED_OUTPATIENT_CLINIC_OR_DEPARTMENT_OTHER)
Admission: RE | Disposition: A | Discharge status: Home / Self Care | Payer: Self-pay | Source: Ambulatory Visit | Attending: Cardiovascular Disease

## 2012-04-16 ENCOUNTER — Ambulatory Visit (INDEPENDENT_AMBULATORY_CARE_PROVIDER_SITE_OTHER): Payer: Medicare Other | Admitting: *Deleted

## 2012-04-16 DIAGNOSIS — I359 Nonrheumatic aortic valve disorder, unspecified: Secondary | ICD-10-CM

## 2012-04-16 DIAGNOSIS — Z7901 Long term (current) use of anticoagulants: Secondary | ICD-10-CM

## 2012-04-16 DIAGNOSIS — Z954 Presence of other heart-valve replacement: Secondary | ICD-10-CM | POA: Insufficient documentation

## 2012-04-16 DIAGNOSIS — I4891 Unspecified atrial fibrillation: Secondary | ICD-10-CM

## 2012-04-16 DIAGNOSIS — I2510 Atherosclerotic heart disease of native coronary artery without angina pectoris: Secondary | ICD-10-CM

## 2012-04-16 DIAGNOSIS — R079 Chest pain, unspecified: Secondary | ICD-10-CM | POA: Insufficient documentation

## 2012-04-16 DIAGNOSIS — I429 Cardiomyopathy, unspecified: Secondary | ICD-10-CM

## 2012-04-16 LAB — POCT I-STAT 3, VENOUS BLOOD GAS (G3P V)
Acid-base deficit: 2 mmol/L (ref 0.0–2.0)
Bicarbonate: 24.4 mEq/L — ABNORMAL HIGH (ref 20.0–24.0)
pCO2, Ven: 45.4 mmHg (ref 45.0–50.0)
pO2, Ven: 37 mmHg (ref 30.0–45.0)

## 2012-04-16 LAB — POCT I-STAT 3, ART BLOOD GAS (G3+)
Bicarbonate: 23.3 mEq/L (ref 20.0–24.0)
pH, Arterial: 7.37 (ref 7.350–7.450)
pO2, Arterial: 71 mmHg — ABNORMAL LOW (ref 80.0–100.0)

## 2012-04-16 SURGERY — JV LEFT AND RIGHT HEART CATHETERIZATION WITH CORONARY ANGIOGRAM
Anesthesia: Moderate Sedation

## 2012-04-16 MED ORDER — DIAZEPAM 5 MG PO TABS
5.0000 mg | ORAL_TABLET | ORAL | Status: AC
  Administered 2012-04-16: 5 mg via ORAL

## 2012-04-16 MED ORDER — ASPIRIN 81 MG PO CHEW
324.0000 mg | CHEWABLE_TABLET | ORAL | Status: AC
  Administered 2012-04-16: 324 mg via ORAL

## 2012-04-16 MED ORDER — ONDANSETRON HCL 4 MG/2ML IJ SOLN: 4.0000 mg | Freq: Four times a day (QID) | INTRAMUSCULAR | Status: DC | PRN

## 2012-04-16 MED ORDER — ACETAMINOPHEN 325 MG PO TABS: 650.0000 mg | ORAL_TABLET | ORAL | Status: DC | PRN

## 2012-04-16 MED ORDER — SODIUM CHLORIDE 0.9 % IV SOLN: 1.0000 mL/kg/h | INTRAVENOUS | Status: DC

## 2012-04-16 MED ORDER — SODIUM CHLORIDE 0.9 % IV SOLN: INTRAVENOUS | Status: DC

## 2012-04-16 NOTE — Interval H&P Note (Signed)
History and Physical Interval Note:  04/16/2012 9:53 AM  Timothy Fritz  has presented today for surgery, with the diagnosis of CAD  The various methods of treatment have been discussed with the patient and family. After consideration of risks, benefits and other options for treatment, the patient has consented to  Procedure(s): JV LEFT AND RIGHT HEART CATHETERIZATION WITH CORONARY ANGIOGRAM (N/A) as a surgical intervention .  The patient's history has been reviewed, patient examined, no change in status, stable for surgery.  I have reviewed the patient's chart and labs.  Questions were answered to the patient's satisfaction.     Tonny Bollman

## 2012-04-16 NOTE — H&P (View-Only) (Signed)
I was able to review records from [facility] in [place]. Mr. Mcdanel was admitted here back on 2/7 with chest discomfort and initially concern for pneumonia with shortness of breath. He had CT scan of his chest which did not demonstrate pulmonary embolus, showed bibasilar atelectasis, mediastinal lymphadenopathy, and asymptomatic cholelithiasis. Chest x-ray described cardiomegaly without infiltrates or effusions. Troponin T level was normal. He did undergo a followup echocardiogram demonstrating reduction in LVEF to 25% with mild LVH and mildly to moderately dilated chamber size, mild mitral regurgitation, stable mechanical aortic prosthesis with trace regurgitation, and mild to moderate tricuspid regurgitation with RVSP 30 mm mercury.  As documented in my visit note from today, Mr. Jedlicka and I discussed proceeding on with a cardiac catheterization, right and left heart, as an outpatient to clearly exclude any interval development of obstructive CAD. We will have Coumadin bridged per the Coumadin clinic in light of mechanical aVR and atrial arrhythmias. Following this we can consider referral to EP to discuss for device therapy.  Samuel G. McDowell, M.D., F.A.C.C.  

## 2012-04-16 NOTE — Progress Notes (Signed)
This encounter was created in error - please disregard.

## 2012-04-16 NOTE — OR Nursing (Signed)
Discharge instructions reviewed and signed, pt stated understanding, ambulated in hall without difficulty, site level 0, Transported to friend's car via wheelchair

## 2012-04-16 NOTE — OR Nursing (Signed)
Meal served 

## 2012-04-16 NOTE — OR Nursing (Signed)
Dr Cooper at bedside to discuss results and treatment plan with pt and family 

## 2012-04-16 NOTE — CV Procedure (Signed)
   Cardiac Catheterization Procedure Note  Name: Timothy Fritz MRN: [id number] DOB: 1941/01/14  Procedure: Right Heart Cath, catheter placement for coronary angiography, Selective Coronary Angiography  Indication: Progressive decline in left ventricular function in patient with known aortic valve disease status post mechanical aortic valve replacement and reimplantation of the coronary arteries.   Procedural Details: The right groin was prepped, draped, and anesthetized with 1% lidocaine. Using the modified Seldinger technique a 4 French sheath was placed in the right femoral artery and a 6 French sheath was placed in the right femoral vein. A multipurpose catheter was used for the right heart catheterization. Standard protocol was followed for recording of right heart pressures and sampling of oxygen saturations. Fick cardiac output was calculated. Standard Judkins catheters were used for selective coronary angiography. Caution was taken to stay clear of the patient's mechanical aortic valve. There were no immediate procedural complications. The patient was transferred to the post catheterization recovery area for further monitoring.  Procedural Findings: Hemodynamics RA 11 RV 36/13 PA 40/18 with a mean of 29 PCWP 20 with a V wave of 29 LV not recorded AO 101/61 with a mean of 79  Oxygen saturations: PA 67 AO 93  Cardiac Output (Fick) 4.9  Cardiac Index (Fick) 2.5   Coronary angiography: Coronary dominance: right  Left mainstem: Widely patent with no obstructive disease.  Left anterior descending (LAD): The LAD is widely patent throughout. There is minor irregularity in the proximal LAD but no significant stenosis. The mid LAD has diffuse irregularity but no significant stenosis. The diagonal branches are small and they are patent throughout.  Left circumflex (LCx): The left circumflex is patent. There is a high obtuse marginal without significant stenosis. The second OM is  patent without stenosis. The left posterolateral branch is patent without stenosis.  Right coronary artery (RCA): The RCA is patent. There is mild irregularity with 20% stenosis in the proximal vessel. The vessel is dominant with a right PDA. There is no significant high-grade stenosis throughout the course of the right coronary artery.  Left ventriculography: Deferred because of mechanical prosthesis  Plane fluoroscopy demonstrates normal appearance of the patient's bileaflet mechanical prosthesis.  Final Conclusions:   1. Widely patent coronary arteries with minimal irregularity but no significant stenoses 2. Mildly elevated right heart pressures and large V wave noted in the pulmonary capillary wedge tracing, possibly suggestive of mitral regurgitation versus noncompliant left ventricle 3. Normal fluoroscopic appearance of mechanical bileaflet aortic prosthesis  Recommendations: Ongoing medical therapy for cardiomyopathy.  Tonny Bollman 04/16/2012, 10:38 AM

## 2012-04-16 NOTE — OR Nursing (Signed)
Tegaderm dressing applied, site level 0, bedrest begins at 1040 

## 2012-04-17 ENCOUNTER — Emergency Department (HOSPITAL_COMMUNITY): Payer: Medicare Other

## 2012-04-17 ENCOUNTER — Telehealth: Payer: Self-pay | Admitting: Cardiovascular Disease

## 2012-04-17 ENCOUNTER — Encounter (HOSPITAL_COMMUNITY): Payer: Self-pay | Admitting: Emergency Medicine

## 2012-04-17 ENCOUNTER — Inpatient Hospital Stay (HOSPITAL_COMMUNITY)
Admission: EM | Admit: 2012-04-17 | Discharge: 2012-04-19 | DRG: 690 | Disposition: A | Discharge status: Home / Self Care | Payer: Medicare Other | Attending: Internal Medicine | Admitting: Internal Medicine

## 2012-04-17 DIAGNOSIS — I428 Other cardiomyopathies: Secondary | ICD-10-CM | POA: Diagnosis present

## 2012-04-17 DIAGNOSIS — I2510 Atherosclerotic heart disease of native coronary artery without angina pectoris: Secondary | ICD-10-CM | POA: Diagnosis present

## 2012-04-17 DIAGNOSIS — I4891 Unspecified atrial fibrillation: Secondary | ICD-10-CM | POA: Diagnosis present

## 2012-04-17 DIAGNOSIS — Z87891 Personal history of nicotine dependence: Secondary | ICD-10-CM

## 2012-04-17 DIAGNOSIS — I959 Hypotension, unspecified: Secondary | ICD-10-CM | POA: Diagnosis present

## 2012-04-17 DIAGNOSIS — Z954 Presence of other heart-valve replacement: Secondary | ICD-10-CM

## 2012-04-17 DIAGNOSIS — E782 Mixed hyperlipidemia: Secondary | ICD-10-CM | POA: Diagnosis present

## 2012-04-17 DIAGNOSIS — N390 Urinary tract infection, site not specified: Principal | ICD-10-CM

## 2012-04-17 DIAGNOSIS — Z7901 Long term (current) use of anticoagulants: Secondary | ICD-10-CM

## 2012-04-17 DIAGNOSIS — B952 Enterococcus as the cause of diseases classified elsewhere: Secondary | ICD-10-CM | POA: Diagnosis present

## 2012-04-17 DIAGNOSIS — A419 Sepsis, unspecified organism: Secondary | ICD-10-CM | POA: Diagnosis present

## 2012-04-17 DIAGNOSIS — Z79899 Other long term (current) drug therapy: Secondary | ICD-10-CM

## 2012-04-17 DIAGNOSIS — Z951 Presence of aortocoronary bypass graft: Secondary | ICD-10-CM

## 2012-04-17 DIAGNOSIS — I10 Essential (primary) hypertension: Secondary | ICD-10-CM | POA: Diagnosis present

## 2012-04-17 DIAGNOSIS — Z8249 Family history of ischemic heart disease and other diseases of the circulatory system: Secondary | ICD-10-CM

## 2012-04-17 LAB — CBC WITH DIFFERENTIAL/PLATELET
Eosinophils Absolute: 0 10*3/uL (ref 0.0–0.7)
Eosinophils Relative: 0 % (ref 0–5)
HCT: 40.4 % (ref 39.0–52.0)
Hemoglobin: 14 g/dL (ref 13.0–17.0)
Lymphocytes Relative: 5 % — ABNORMAL LOW (ref 12–46)
Lymphs Abs: 0.7 10*3/uL (ref 0.7–4.0)
MCH: 30.8 pg (ref 26.0–34.0)
MCV: 89 fL (ref 78.0–100.0)
Monocytes Absolute: 1.9 10*3/uL — ABNORMAL HIGH (ref 0.1–1.0)
Monocytes Relative: 13 % — ABNORMAL HIGH (ref 3–12)
Platelets: 136 10*3/uL — ABNORMAL LOW (ref 150–400)
RBC: 4.54 MIL/uL (ref 4.22–5.81)
WBC: 15 10*3/uL — ABNORMAL HIGH (ref 4.0–10.5)

## 2012-04-17 LAB — URINALYSIS, ROUTINE W REFLEX MICROSCOPIC
Ketones, ur: 15 mg/dL — AB
Nitrite: NEGATIVE
Specific Gravity, Urine: 1.027 (ref 1.005–1.030)
Urobilinogen, UA: 1 mg/dL (ref 0.0–1.0)
pH: 6 (ref 5.0–8.0)

## 2012-04-17 LAB — URINE MICROSCOPIC-ADD ON

## 2012-04-17 LAB — COMPREHENSIVE METABOLIC PANEL
ALT: 99 U/L — ABNORMAL HIGH (ref 0–53)
BUN: 15 mg/dL (ref 6–23)
CO2: 26 mEq/L (ref 19–32)
Calcium: 9.3 mg/dL (ref 8.4–10.5)
GFR calc Af Amer: 71 mL/min — ABNORMAL LOW (ref >90)
GFR calc non Af Amer: 62 mL/min — ABNORMAL LOW (ref >90)
Glucose, Bld: 101 mg/dL — ABNORMAL HIGH (ref 70–99)
Total Protein: 6.9 g/dL (ref 6.0–8.3)

## 2012-04-17 LAB — PROTIME-INR: INR: 1.32 (ref 0.00–1.49)

## 2012-04-17 MED ORDER — SODIUM CHLORIDE 0.9 % IV SOLN
INTRAVENOUS | Status: DC
  Administered 2012-04-17: via INTRAVENOUS

## 2012-04-17 MED ORDER — IBUPROFEN 100 MG/5ML PO SUSP: 400.0000 mg | Freq: Once | ORAL | Status: DC

## 2012-04-17 MED ORDER — ACETAMINOPHEN 325 MG PO TABS
650.0000 mg | ORAL_TABLET | Freq: Once | ORAL | Status: AC
  Administered 2012-04-17: 650 mg via ORAL
  Filled 2012-04-17: qty 2

## 2012-04-17 MED ORDER — SODIUM CHLORIDE 0.9 % IV BOLUS (SEPSIS)
500.0000 mL | Freq: Once | INTRAVENOUS | Status: AC
  Administered 2012-04-17: 500 mL via INTRAVENOUS

## 2012-04-17 MED ORDER — DEXTROSE 5 % IV SOLN
1.0000 g | INTRAVENOUS | Status: DC
  Administered 2012-04-17: 1 g via INTRAVENOUS
  Filled 2012-04-17: qty 10

## 2012-04-17 NOTE — Telephone Encounter (Addendum)
Per caretaker, patient c/o low grade temp on yesterday. Tylenol given and improved. Patient c/o temp today of 101.0, urine dribbling and has pain with urinating. Patient has no appetite. No discomfort at site of cath right side of groin. Patient hasn't taken any tylenol today. Nurse advised patient caretaker that he should take tylenol, and needed to be evaluated by a provider. Nurse informed patient caretaker that he could either call PCP or go to ED for evaluation. Caretaker verbalized understanding of plan. MD informed.

## 2012-04-17 NOTE — ED Notes (Signed)
Patient transported to X-ray 

## 2012-04-17 NOTE — Telephone Encounter (Signed)
Looks like this is a patient of Dr Diona Browner in [place]. Will forward.

## 2012-04-17 NOTE — ED Notes (Signed)
Pt c/o pain with urination and only urinating small amounts onset last pm.  Today has had elevated temp.  St's he just feels weak. Pt denies nausea or vomiting

## 2012-04-17 NOTE — ED Provider Notes (Signed)
History     CSN: [id number]  Arrival date & time 04/17/12  1549   First MD Initiated Contact with Patient 04/17/12 1616      Chief Complaint  Patient presents with  . Fever  . Weakness    (Consider location/radiation/quality/duration/timing/severity/associated sxs/prior treatment) Patient is a 72 y.o. male presenting with fever and weakness. The history is provided by the patient and the spouse.  Fever Weakness   patient here complaining of low-grade temperature since yesterday. He had a heart catheterization which according him came back normal. He did begin to have urinary symptoms consist of dysuria without hematuria. He notes that he did not have a Foley cath in place. Some nonproductive cough as well 2. No vomiting or diarrhea. Has used Tylenol with relief of his symptoms. Denies any dyspnea or abdominal pain. Denies any chest pain. No flank pain. Symptoms have been persistent  Past Medical History  Diagnosis Date  . Atrial fibrillation     Paroyxsmal atrial fibrillation and atrial flutter  . Coronary artery disease     Nonobstructive at cardiac catherization 2002  . Mixed hyperlipidemia   . Essential hypertension, benign   . Left bundle branch block   . BPH (benign prostatic hypertrophy)   . Cardiomyopathy     Nonischemic (EF 45%)  . Aortic valve, bicuspid     Mechanical AVR  . Aortic aneurysm, thoracic     Fusiform s/p Bentall procedure 2002  . Cancer     Past Surgical History  Procedure Laterality Date  . Bentall procedure      [facility], 2002  . Tonsillectomy      Family History  Problem Relation Age of Onset  . Coronary artery disease Father     History  Substance Use Topics  . Smoking status: Former Smoker    Types: Cigarettes    Quit date: 02/15/2000  . Smokeless tobacco: Never Used  . Alcohol Use: Yes     Comment: Wine on a regular basis      Review of Systems  Constitutional: Positive for fever.  Neurological: Positive for weakness.  All  other systems reviewed and are negative.    Allergies  Review of patient's allergies indicates no known allergies.  Home Medications   Current Outpatient Rx  Name  Route  Sig  Dispense  Refill  . amiodarone (PACERONE) 200 MG tablet   Oral   Take 1 tablet (200 mg total) by mouth daily.   90 tablet   3   . atorvastatin (LIPITOR) 20 MG tablet   Oral   Take 1 tablet (20 mg total) by mouth daily. Patient uses [facility]   90 tablet   3   . furosemide (LASIX) 20 MG tablet   Oral   Take 1 tablet (20 mg total) by mouth daily.   90 tablet   3   . losartan (COZAAR) 50 MG tablet   Oral   Take 1 tablet (50 mg total) by mouth daily.   90 tablet   3   . metoprolol (TOPROL XL) 50 MG 24 hr tablet   Oral   Take 50 mg by mouth 2 (two) times daily.          . Multiple Vitamin (MULTIVITAMIN PO)      Take one by mouth daily           . Omega-3 Fatty Acids (FISH OIL) 1000 MG CAPS      Take one by mouth twice daily            .  Tamsulosin HCl (FLOMAX) 0.4 MG CAPS      Take one by mouth daily           . warfarin (COUMADIN) 5 MG tablet      Use as directed, managed by Dr. Sherryll Burger           Pulse 103  Temp(Src) 99.7 F (37.6 C) (Oral)  Resp 20  SpO2 100%  Physical Exam  Nursing note and vitals reviewed. Constitutional: He is oriented to person, place, and time. He appears well-developed and well-nourished.  Non-toxic appearance. No distress.  HENT:  Head: Normocephalic and atraumatic.  Eyes: Conjunctivae, EOM and lids are normal. Pupils are equal, round, and reactive to light.  Neck: Normal range of motion. Neck supple. No tracheal deviation present. No mass present.  Cardiovascular: Regular rhythm and normal heart sounds.  Tachycardia present.  Exam reveals no gallop.   No murmur heard. Pulmonary/Chest: Effort normal and breath sounds normal. No stridor. No respiratory distress. He has no decreased breath sounds. He has no wheezes. He has no  rhonchi. He has no rales.  Abdominal: Soft. Normal appearance and bowel sounds are normal. He exhibits no distension. There is no tenderness. There is no rigidity, no rebound, no guarding and no CVA tenderness.  Musculoskeletal: Normal range of motion. He exhibits no edema and no tenderness.  Neurological: He is alert and oriented to person, place, and time. He has normal strength. No cranial nerve deficit or sensory deficit. GCS eye subscore is 4. GCS verbal subscore is 5. GCS motor subscore is 6.  Skin: Skin is warm and dry. No abrasion and no rash noted.  Psychiatric: He has a normal mood and affect. His speech is normal and behavior is normal.    ED Course  Procedures (including critical care time)  Labs Reviewed  CULTURE, BLOOD (ROUTINE X 2)  CULTURE, BLOOD (ROUTINE X 2)  URINE CULTURE  LACTIC ACID, PLASMA  CBC WITH DIFFERENTIAL  COMPREHENSIVE METABOLIC PANEL  URINALYSIS, ROUTINE W REFLEX MICROSCOPIC  PROTIME-INR   No results found.   No diagnosis found.    MDM   Date: 04/17/2012  Rate: 103  Rhythm: atrial fibrillation  QRS Axis: normal  Intervals: normal  ST/T Wave abnormalities: nonspecific ST changes  Conduction Disutrbances:left bundle branch block  Narrative Interpretation:   Old EKG Reviewed: none available  10:05 PM Patient given Rocephin for his UTI. Also given IV fluids for hypotension. Patient's admitted for suspected urosepsis  CRITICAL CARE Performed by: Toy Baker   Total critical care time: 75  Critical care time was exclusive of separately billable procedures and treating other patients.  Critical care was necessary to treat or prevent imminent or life-threatening deterioration.  Critical care was time spent personally by me on the following activities: development of treatment plan with patient and/or surrogate as well as nursing, discussions with consultants, evaluation of patient's response to treatment, examination of patient, obtaining  history from patient or surrogate, ordering and performing treatments and interventions, ordering and review of laboratory studies, ordering and review of radiographic studies, pulse oximetry and re-evaluation of patient's condition.         Toy Baker, MD 04/17/12 2206

## 2012-04-17 NOTE — ED Notes (Signed)
Hessie Diener, MD notified re: BP decreasing

## 2012-04-17 NOTE — Telephone Encounter (Signed)
New Prob    Had heart cath done yesterday and is now running a fever. Has gotten up to 101. Took some tylenol, temp came down a little bit earlier but went back up.

## 2012-04-18 ENCOUNTER — Encounter (HOSPITAL_COMMUNITY): Payer: Self-pay | Admitting: Internal Medicine

## 2012-04-18 DIAGNOSIS — Z7901 Long term (current) use of anticoagulants: Secondary | ICD-10-CM

## 2012-04-18 DIAGNOSIS — A419 Sepsis, unspecified organism: Secondary | ICD-10-CM | POA: Diagnosis present

## 2012-04-18 DIAGNOSIS — I4891 Unspecified atrial fibrillation: Secondary | ICD-10-CM

## 2012-04-18 DIAGNOSIS — Z954 Presence of other heart-valve replacement: Secondary | ICD-10-CM

## 2012-04-18 DIAGNOSIS — N390 Urinary tract infection, site not specified: Secondary | ICD-10-CM

## 2012-04-18 LAB — BASIC METABOLIC PANEL
CO2: 25 mEq/L (ref 19–32)
Calcium: 8.6 mg/dL (ref 8.4–10.5)
Chloride: 104 mEq/L (ref 96–112)
Glucose, Bld: 95 mg/dL (ref 70–99)
Potassium: 3.7 mEq/L (ref 3.5–5.1)

## 2012-04-18 LAB — CBC
HCT: 35.1 % — ABNORMAL LOW (ref 39.0–52.0)
MCHC: 35 g/dL (ref 30.0–36.0)
MCV: 86.5 fL (ref 78.0–100.0)
Platelets: 118 10*3/uL — ABNORMAL LOW (ref 150–400)
RDW: 13.7 % (ref 11.5–15.5)

## 2012-04-18 LAB — MRSA PCR SCREENING: MRSA by PCR: NEGATIVE

## 2012-04-18 MED ORDER — AMIODARONE HCL 200 MG PO TABS
200.0000 mg | ORAL_TABLET | Freq: Every day | ORAL | Status: DC
  Administered 2012-04-18 – 2012-04-19 (×2): 200 mg via ORAL
  Filled 2012-04-18 (×2): qty 1

## 2012-04-18 MED ORDER — SODIUM CHLORIDE 0.9 % IJ SOLN: 3.0000 mL | INTRAMUSCULAR | Status: DC | PRN

## 2012-04-18 MED ORDER — ENOXAPARIN SODIUM 150 MG/ML ~~LOC~~ SOLN
140.0000 mg | Freq: Every day | SUBCUTANEOUS | Status: DC
  Administered 2012-04-18 – 2012-04-19 (×2): 140 mg via SUBCUTANEOUS
  Filled 2012-04-18 (×2): qty 1

## 2012-04-18 MED ORDER — SODIUM CHLORIDE 0.9 % IJ SOLN: 3.0000 mL | Freq: Two times a day (BID) | INTRAMUSCULAR | Status: DC

## 2012-04-18 MED ORDER — SODIUM CHLORIDE 0.9 % IV SOLN: INTRAVENOUS | Status: AC

## 2012-04-18 MED ORDER — ONDANSETRON HCL 4 MG/2ML IJ SOLN: 4.0000 mg | Freq: Four times a day (QID) | INTRAMUSCULAR | Status: DC | PRN

## 2012-04-18 MED ORDER — ACETAMINOPHEN 325 MG PO TABS
650.0000 mg | ORAL_TABLET | ORAL | Status: DC | PRN
  Administered 2012-04-18: 650 mg via ORAL
  Filled 2012-04-18: qty 2

## 2012-04-18 MED ORDER — ATORVASTATIN CALCIUM 20 MG PO TABS
20.0000 mg | ORAL_TABLET | Freq: Every evening | ORAL | Status: DC
  Administered 2012-04-18: 20 mg via ORAL
  Filled 2012-04-18 (×2): qty 1

## 2012-04-18 MED ORDER — ONDANSETRON HCL 4 MG PO TABS: 4.0000 mg | ORAL_TABLET | Freq: Four times a day (QID) | ORAL | Status: DC | PRN

## 2012-04-18 MED ORDER — SODIUM CHLORIDE 0.9 % IV SOLN: 250.0000 mL | INTRAVENOUS | Status: DC | PRN

## 2012-04-18 MED ORDER — WARFARIN SODIUM 5 MG PO TABS
5.0000 mg | ORAL_TABLET | Freq: Once | ORAL | Status: AC
  Administered 2012-04-18: 5 mg via ORAL
  Filled 2012-04-18: qty 1

## 2012-04-18 MED ORDER — DOCUSATE SODIUM 100 MG PO CAPS
100.0000 mg | ORAL_CAPSULE | Freq: Two times a day (BID) | ORAL | Status: DC
  Administered 2012-04-18 – 2012-04-19 (×3): 100 mg via ORAL
  Filled 2012-04-18 (×4): qty 1

## 2012-04-18 MED ORDER — TAMSULOSIN HCL 0.4 MG PO CAPS
0.4000 mg | ORAL_CAPSULE | Freq: Every day | ORAL | Status: DC
  Administered 2012-04-18: 0.4 mg via ORAL
  Filled 2012-04-18 (×2): qty 1

## 2012-04-18 MED ORDER — DEXTROSE 5 % IV SOLN
1.0000 g | INTRAVENOUS | Status: DC
  Administered 2012-04-18: 1 g via INTRAVENOUS
  Filled 2012-04-18 (×2): qty 10

## 2012-04-18 MED ORDER — METOPROLOL SUCCINATE ER 50 MG PO TB24
50.0000 mg | ORAL_TABLET | Freq: Two times a day (BID) | ORAL | Status: DC
  Administered 2012-04-18 – 2012-04-19 (×4): 50 mg via ORAL
  Filled 2012-04-18 (×5): qty 1

## 2012-04-18 MED ORDER — WARFARIN - PHARMACIST DOSING INPATIENT
Freq: Every day | Status: DC
  Administered 2012-04-18: 18:00:00

## 2012-04-18 MED ORDER — OMEGA-3-ACID ETHYL ESTERS 1 G PO CAPS
1.0000 g | ORAL_CAPSULE | Freq: Two times a day (BID) | ORAL | Status: DC
  Administered 2012-04-18 – 2012-04-19 (×3): 1 g via ORAL
  Filled 2012-04-18 (×4): qty 1

## 2012-04-18 NOTE — Progress Notes (Signed)
[facility] PROGRESS NOTE  Timothy Fritz ZOX:[id number] DOB: 03-Apr-1940 DOA: 04/17/2012 PCP: [name], MD  Assessment/Plan: 1. Sepsis from UTI: urine cultures sent. On rocephin.  2. Hypotension: resolved with IV fluids.  3. Atrial fibrillation: on amiodarone and coumadin. Rate controlled.  4. BPH: on flomax.  5. DVT prophylaxis.   Code Status: full code Family Communication: friend at bedside Disposition Plan: possibly in 1 to 2 days.    Antibiotics: Rocephin 3/5 HPI/Subjective: Would like to go home as soon as possible.   Objective: Filed Vitals:   04/18/12 0602 04/18/12 0700 04/18/12 0800 04/18/12 0810  BP:  99/55 121/70 108/65  Pulse:  78 79 81  Temp: 100.2 F (37.9 C)   98.5 F (36.9 C)  TempSrc: Oral   Oral  Resp:  21 19 19   Height:      Weight:      SpO2:  94% 95% 93%    Intake/Output Summary (Last 24 hours) at 04/18/12 1125 Last data filed at 04/18/12 0600  Gross per 24 hour  Intake 562.09 ml  Output    250 ml  Net 312.09 ml   Filed Weights   04/17/12 2341  Weight: 89.9 kg (198 lb 3.1 oz)    Exam: Alert afebrile comfortable. CHEST: Normal respiration, clear to auscultation bilaterally.  HEART: Regular rate and rhythm. There is mechanical valve and soft II/VI murmur.  BACK: No kyphosis or scoliosis; no CVA tenderness  ABDOMEN: soft and non-tender; no masses, no organomegaly, normal abdominal bowel sounds; no pannus; no intertriginous candida. There is no rebound and no distention.  EXTREMITIES: No bone or joint deformity; age-appropriate arthropathy of the hands and knees; no edema; no ulcerations. There is no calf tenderness   Data Reviewed: Basic Metabolic Panel:  Recent Labs Lab 04/17/12 1709 04/18/12 0412  NA 135 137  K 4.2 3.7  CL 99 104  CO2 26 25  GLUCOSE 101* 95  BUN 15 15  CREATININE 1.16 1.09  CALCIUM 9.3 8.6   Liver Function Tests:  Recent Labs Lab 04/17/12 1709  AST 93*  ALT 99*  ALKPHOS 75  BILITOT 2.3*   PROT 6.9  ALBUMIN 3.9   No results found for this basename: LIPASE, AMYLASE,  in the last 168 hours No results found for this basename: AMMONIA,  in the last 168 hours CBC:  Recent Labs Lab 04/17/12 1709 04/18/12 0412  WBC 15.0* 14.8*  NEUTROABS 12.3*  --   HGB 14.0 12.3*  HCT 40.4 35.1*  MCV 89.0 86.5  PLT 136* 118*   Cardiac Enzymes: No results found for this basename: CKTOTAL, CKMB, CKMBINDEX, TROPONINI,  in the last 168 hours BNP (last 3 results) No results found for this basename: PROBNP,  in the last 8760 hours CBG: No results found for this basename: GLUCAP,  in the last 168 hours  Recent Results (from the past 240 hour(s))  MRSA PCR SCREENING     Status: None   Collection Time    04/18/12 12:31 AM      Result Value Range Status   MRSA by PCR NEGATIVE  NEGATIVE Final   Comment:            The GeneXpert MRSA Assay (FDA     approved for NASAL specimens     only), is one component of a     comprehensive MRSA colonization     surveillance program. It is not     intended to diagnose MRSA     infection nor  to guide or     monitor treatment for     MRSA infections.     Studies: Dg Chest 2 View  04/17/2012  *RADIOLOGY REPORT*  Clinical Data: Weakness, body aches, fever, pain, 1 day post heart catheterization  CHEST - 2 VIEW  Comparison: 10/17/2008  Findings: Enlargement of cardiac silhouette post AVR. Tortuous aorta. Pulmonary vascularity normal. Minimal left basilar scarring. Lungs otherwise clear. No pleural effusion or pneumothorax. No acute osseous findings.  IMPRESSION: Enlargement of cardiac silhouette post AVR. Minimal left basilar scarring.   Original Report Authenticated By: Ulyses Southward, M.D.     Scheduled Meds: . sodium chloride   Intravenous STAT  . amiodarone  200 mg Oral Daily  . atorvastatin  20 mg Oral QPM  . cefTRIAXone (ROCEPHIN)  IV  1 g Intravenous Q24H  . docusate sodium  100 mg Oral BID  . enoxaparin  140 mg Subcutaneous Daily  . metoprolol  succinate  50 mg Oral BID  . omega-3 acid ethyl esters  1 g Oral BID  . sodium chloride  3 mL Intravenous Q12H  . sodium chloride  3 mL Intravenous Q12H  . tamsulosin  0.4 mg Oral QPC supper  . warfarin  5 mg Oral ONCE-1800  . Warfarin - Pharmacist Dosing Inpatient   Does not apply q1800   Continuous Infusions: . sodium chloride 10 mL/hr at 04/17/12 2335    Principal Problem:   Urosepsis Active Problems:   Mixed hyperlipidemia   Atrial fibrillation   S/P aortic valve replacement with metallic valve   Long term (current) use of anticoagulants   UTI (lower urinary tract infection)    Time spent: 25 min    AKULA,VIJAYA  [facility] Pager [phone]. If 7PM-7AM, please contact night-coverage at [email or web address], password [facility] 04/18/2012, 11:25 AM  LOS: 1 day

## 2012-04-18 NOTE — Progress Notes (Signed)
ANTICOAGULATION CONSULT NOTE - Initial Consult  Pharmacy Consult for warfarin Indication: H/o mechanical AVR, atrial fibrillation   No Known Allergies  Patient Measurements: Height: 5\' 8"  (172.7 cm) Weight: 198 lb 3.1 oz (89.9 kg) IBW/kg (Calculated) : 68.4  Vital Signs: Temp: 98.1 F (36.7 C) (03/04 2341) Temp src: Oral (03/04 2341) BP: 108/73 mmHg (03/04 2341) Pulse Rate: 83 (03/04 2341)  Labs:  Recent Labs  04/16/12 04/17/12 1709  HGB  --  14.0  HCT  --  40.4  PLT  --  136*  LABPROT  --  16.1*  INR 1.4 1.32  CREATININE  --  1.16    Estimated Creatinine Clearance: 63.6 ml/min (by C-G formula based on Cr of 1.16).   Medical History: Past Medical History  Diagnosis Date  . Atrial fibrillation     Paroyxsmal atrial fibrillation and atrial flutter  . Coronary artery disease     Nonobstructive at cardiac catherization 2002  . Mixed hyperlipidemia   . Essential hypertension, benign   . Left bundle branch block   . BPH (benign prostatic hypertrophy)   . Cardiomyopathy     Nonischemic (EF 45%)  . Aortic valve, bicuspid     Mechanical AVR  . Aortic aneurysm, thoracic     Fusiform s/p Bentall procedure 2002  . Cancer     Medications:  Scheduled:  . sodium chloride   Intravenous STAT  . [COMPLETED] acetaminophen  650 mg Oral Once  . amiodarone  200 mg Oral Daily  . atorvastatin  20 mg Oral QPM  . cefTRIAXone (ROCEPHIN)  IV  1 g Intravenous Q24H  . docusate sodium  100 mg Oral BID  . enoxaparin  140 mg Subcutaneous Daily  . metoprolol succinate  50 mg Oral BID  . omega-3 acid ethyl esters  1 g Oral BID  . [COMPLETED] sodium chloride  500 mL Intravenous Once  . sodium chloride  3 mL Intravenous Q12H  . sodium chloride  3 mL Intravenous Q12H  . tamsulosin  0.4 mg Oral QPC supper  . [DISCONTINUED] cefTRIAXone (ROCEPHIN)  IV  1 g Intravenous Q24H  . [DISCONTINUED] ibuprofen  400 mg Oral Once    Assessment: 72 yo male with h/o atrial fibrillation and  mechanical AVR presented with fever and weakness. Patient with cardiac cath on 3/3. Pharmacy to manage Coumadin. Patient is also to receive Lovenox 140mg  Q24H (~1.5 mg/kg Q24H). Last Coumadin dose on 3/3.   Goal of Therapy:  INR 2.5 - 3.5 Monitor platelets by anticoagulation protocol: Yes   Plan:  1. Coumadin 5mg  po x 1. 2. Daily PT / INR 3. Coumadin education with pharmacist.   Emeline Gins 04/18/2012,1:09 AM

## 2012-04-18 NOTE — H&P (Signed)
[facility] History and Physical  Timothy Fritz:[id number] DOB: August 23, 1940    PCP:   [name], MD   Chief Complaint: fever and weakness.  HPI: Timothy Fritz is an 72 y.o. male with hx of mechanical AVR, along with CABG, LBBB, CMP with progressive decline requiring recent cath, showing normal coronaries and working mechanical valve, presents to the ER with fever and malaise.  He was found to have a UTI, and was found to be hypotensive.  Fortunately, he did respond to IVF.  His INR was found to be subtherapeutic at 1.2, and he has been bridging with Lovenox SQ.  He has a WBC of 15K with Hb of 14g/DL.  His renal fx tests were normal.  Hospitalist was asked to admit him for urosepsis.  Rewiew of Systems:  Constitutional: . No significant weight loss or weight gain Eyes: Negative for eye pain, redness and discharge, diplopia, visual changes, or flashes of light. ENMT: Negative for ear pain, hoarseness, nasal congestion, sinus pressure and sore throat. No headaches; tinnitus, drooling, or problem swallowing. Cardiovascular: Negative for chest pain, palpitations, diaphoresis, dyspnea and peripheral edema. ; No orthopnea, PND Respiratory: Negative for cough, hemoptysis, wheezing and stridor. No pleuritic chestpain. Gastrointestinal: Negative for nausea, vomiting, diarrhea, constipation, abdominal pain, melena, blood in stool, hematemesis, jaundice and rectal bleeding.    Genitourinary: Negative for incontinence,flank pain and hematuria; Musculoskeletal: Negative for back pain and neck pain. Negative for swelling and trauma.;  Skin: . Negative for pruritus, rash, abrasions, bruising and skin lesion.; ulcerations Neuro: Negative for headache, lightheadedness and neck stiffness. Negative for weakness, altered level of consciousness , altered mental status, extremity weakness, burning feet, involuntary movement, seizure and syncope.  Psych: negative for anxiety, depression, insomnia,  tearfulness, panic attacks, hallucinations, paranoia, suicidal or homicidal ideation    Past Medical History  Diagnosis Date  . Atrial fibrillation     Paroyxsmal atrial fibrillation and atrial flutter  . Coronary artery disease     Nonobstructive at cardiac catherization 2002  . Mixed hyperlipidemia   . Essential hypertension, benign   . Left bundle branch block   . BPH (benign prostatic hypertrophy)   . Cardiomyopathy     Nonischemic (EF 45%)  . Aortic valve, bicuspid     Mechanical AVR  . Aortic aneurysm, thoracic     Fusiform s/p Bentall procedure 2002  . Cancer     Past Surgical History  Procedure Laterality Date  . Bentall procedure      [facility], 2002  . Tonsillectomy      Medications:  HOME MEDS: Prior to Admission medications   Medication Sig Start Date End Date Taking? Authorizing Provider  amiodarone (PACERONE) 200 MG tablet Take 1 tablet (200 mg total) by mouth daily. 02/03/11  Yes [name], MD  atorvastatin (LIPITOR) 20 MG tablet Take 20 mg by mouth every evening.   Yes Historical Provider, MD  enoxaparin (LOVENOX) 150 MG/ML injection Inject 140 mg into the skin daily. Stop 04/20/12   Yes Historical Provider, MD  furosemide (LASIX) 20 MG tablet Take 1 tablet (20 mg total) by mouth daily. 04/06/12  Yes [name], MD  losartan (COZAAR) 50 MG tablet Take 1 tablet (50 mg total) by mouth daily. 04/06/12  Yes [name], MD  metoprolol (TOPROL XL) 50 MG 24 hr tablet Take 50 mg by mouth 2 (two) times daily.    Yes Historical Provider, MD  Multiple Vitamin (MULTIVITAMIN WITH MINERALS) TABS Take 1 tablet by mouth  daily.   Yes Historical Provider, MD  Omega-3 Fatty Acids (FISH OIL) 1000 MG CAPS Take 1,000 mg by mouth daily.    Yes Historical Provider, MD  Tamsulosin HCl (FLOMAX) 0.4 MG CAPS Take 0.4 mg by mouth daily after supper.    Yes Historical Provider, MD  warfarin (COUMADIN) 5 MG tablet Take 2.5-5 mg by mouth See admin instructions. 5mg  daily  through 04/21/12; then 2.5mg  Monday, Wednesday, Friday, Sunday; 5mg  Saturday, Tuesday, Thursday   Yes Historical Provider, MD     Allergies:  No Known Allergies  Social History:   reports that he quit smoking about 12 years ago. His smoking use included Cigarettes. He smoked 0.00 packs per day. He has never used smokeless tobacco. He reports that  drinks alcohol. He reports that he does not use illicit drugs.  Family History: Family History  Problem Relation Age of Onset  . Coronary artery disease Father      Physical Exam: Filed Vitals:   04/18/12 0348 04/18/12 0400 04/18/12 0500 04/18/12 0602  BP: 114/55 110/60 108/61   Pulse: 89 89 72   Temp: 101.1 F (38.4 C)   100.2 F (37.9 C)  TempSrc: Oral   Oral  Resp: 22 23 31    Height:      Weight:      SpO2: 95% 93% 93%    Blood pressure 108/61, pulse 72, temperature 100.2 F (37.9 C), temperature source Oral, resp. rate 31, height 5\' 8"  (1.727 m), weight 89.9 kg (198 lb 3.1 oz), SpO2 93.00%.  GEN:  Pleasant patient lying in the stretcher in no acute distress; cooperative with exam. PSYCH:  alert and oriented x4; does not appear anxious or depressed; affect is appropriate. HEENT: Mucous membranes pink and anicteric; PERRLA; EOM intact; no cervical lymphadenopathy nor thyromegaly or carotid bruit; no JVD; There were no stridor. Neck is very supple. Breasts:: Not examined CHEST WALL: No tenderness CHEST: Normal respiration, clear to auscultation bilaterally.  HEART: Regular rate and rhythm.  There is mechanical valve and soft II/VI murmur. BACK: No kyphosis or scoliosis; no CVA tenderness ABDOMEN: soft and non-tender; no masses, no organomegaly, normal abdominal bowel sounds; no pannus; no intertriginous candida. There is no rebound and no distention. Rectal Exam: Not done EXTREMITIES: No bone or joint deformity; age-appropriate arthropathy of the hands and knees; no edema; no ulcerations.  There is no calf tenderness. Genitalia:  not examined PULSES: 2+ and symmetric SKIN: Normal hydration no rash or ulceration CNS: Cranial nerves 2-12 grossly intact no focal lateralizing neurologic deficit.  Speech is fluent; uvula elevated with phonation, facial symmetry and tongue midline. DTR are normal bilaterally, cerebella exam is intact, barbinski is negative and strengths are equaled bilaterally.  No sensory loss.   Labs on Admission:  Basic Metabolic Panel:  Recent Labs Lab 04/17/12 1709 04/18/12 0412  NA 135 137  K 4.2 3.7  CL 99 104  CO2 26 25  GLUCOSE 101* 95  BUN 15 15  CREATININE 1.16 1.09  CALCIUM 9.3 8.6   Liver Function Tests:  Recent Labs Lab 04/17/12 1709  AST 93*  ALT 99*  ALKPHOS 75  BILITOT 2.3*  PROT 6.9  ALBUMIN 3.9   No results found for this basename: LIPASE, AMYLASE,  in the last 168 hours No results found for this basename: AMMONIA,  in the last 168 hours CBC:  Recent Labs Lab 04/17/12 1709 04/18/12 0412  WBC 15.0* 14.8*  NEUTROABS 12.3*  --   HGB 14.0 12.3*  HCT  40.4 35.1*  MCV 89.0 86.5  PLT 136* PENDING   Cardiac Enzymes: No results found for this basename: CKTOTAL, CKMB, CKMBINDEX, TROPONINI,  in the last 168 hours  CBG: No results found for this basename: GLUCAP,  in the last 168 hours   Radiological Exams on Admission: Dg Chest 2 View  04/17/2012  *RADIOLOGY REPORT*  Clinical Data: Weakness, body aches, fever, pain, 1 day post heart catheterization  CHEST - 2 VIEW  Comparison: 10/17/2008  Findings: Enlargement of cardiac silhouette post AVR. Tortuous aorta. Pulmonary vascularity normal. Minimal left basilar scarring. Lungs otherwise clear. No pleural effusion or pneumothorax. No acute osseous findings.  IMPRESSION: Enlargement of cardiac silhouette post AVR. Minimal left basilar scarring.   Original Report Authenticated By: Ulyses Southward, M.D.      Assessment/Plan Present on Admission:  . Urosepsis . Mixed hyperlipidemia . Atrial  fibrillation Anticogulation   PLAN:  This patient have urosepsis.   Will admit him for IV antibiotic.  I suspect that his hypotension is volume depleted rather than sepsis.  He is subtherapeutic on his coumadin, used for his mechanical aortic valve,  so we will continue Lovenox and Coumadin bridging.  I have continued him on his home medication.  He is stable, full code, and will be admitted to [facility] service.  Thank you for allowing me to partake in the care of your nice patient.  Other plans as per orders.  Code Status: FULL Unk Lightning, MD. [facility] Pager [phone] 7pm to 7am.  04/18/2012, 6:11 AM

## 2012-04-18 NOTE — Progress Notes (Signed)
ANTICOAGULATION CONSULT NOTE - Follow Up Consult  Pharmacy Consult for warfarin  Indication: H/o mechanical AVR, atrial fibrillation   No Known Allergies  Patient Measurements: Height: 5\' 8"  (172.7 cm) Weight: 198 lb 3.1 oz (89.9 kg) IBW/kg (Calculated) : 68.4   Vital Signs: Temp: 100.2 F (37.9 C) (03/05 0602) Temp src: Oral (03/05 0602) BP: 108/61 mmHg (03/05 0500) Pulse Rate: 72 (03/05 0500)  Labs:  Recent Labs  04/16/12 04/17/12 1709 04/18/12 0412  HGB  --  14.0 12.3*  HCT  --  40.4 35.1*  PLT  --  136* 118*  LABPROT  --  16.1* 17.4*  INR 1.4 1.32 1.47  CREATININE  --  1.16 1.09    Estimated Creatinine Clearance: 67.7 ml/min (by C-G formula based on Cr of 1.09).   Medications:  Scheduled:  . sodium chloride   Intravenous STAT  . [COMPLETED] acetaminophen  650 mg Oral Once  . amiodarone  200 mg Oral Daily  . atorvastatin  20 mg Oral QPM  . cefTRIAXone (ROCEPHIN)  IV  1 g Intravenous Q24H  . docusate sodium  100 mg Oral BID  . enoxaparin  140 mg Subcutaneous Daily  . metoprolol succinate  50 mg Oral BID  . omega-3 acid ethyl esters  1 g Oral BID  . [COMPLETED] sodium chloride  500 mL Intravenous Once  . sodium chloride  3 mL Intravenous Q12H  . sodium chloride  3 mL Intravenous Q12H  . tamsulosin  0.4 mg Oral QPC supper  . [COMPLETED] warfarin  5 mg Oral Once  . Warfarin - Pharmacist Dosing Inpatient   Does not apply q1800  . [DISCONTINUED] cefTRIAXone (ROCEPHIN)  IV  1 g Intravenous Q24H  . [DISCONTINUED] ibuprofen  400 mg Oral Once    Assessment: 72 yo male here with urosepsis on coumadin PTA for MVR/afib. INR today is 1.47 and patient to continue on lovenox/coumadin.  Noted coumadin 5mg  was given early this am and this should be consider his dose for 04/17/12.  Goal of Therapy:  INR= 2.5-3.5 Monitor platelets by anticoagulation protocol: Yes   Plan:  -Coumadin 5mg  po today -Daily PT/INR  Harland German, Pharm D 04/18/2012 8:49 AM

## 2012-04-19 DIAGNOSIS — I10 Essential (primary) hypertension: Secondary | ICD-10-CM

## 2012-04-19 LAB — URINE CULTURE

## 2012-04-19 MED ORDER — FINASTERIDE 5 MG PO TABS
5.0000 mg | ORAL_TABLET | Freq: Every day | ORAL | Status: DC
  Administered 2012-04-19: 5 mg via ORAL
  Filled 2012-04-19: qty 1

## 2012-04-19 MED ORDER — LEVOFLOXACIN 500 MG PO TABS: 500.0000 mg | ORAL_TABLET | Freq: Every day | ORAL | Status: DC

## 2012-04-19 MED ORDER — FINASTERIDE 5 MG PO TABS: 5.0000 mg | ORAL_TABLET | Freq: Every day | ORAL | Status: DC

## 2012-04-19 MED ORDER — LEVOFLOXACIN 500 MG PO TABS
500.0000 mg | ORAL_TABLET | Freq: Every day | ORAL | Status: DC
  Administered 2012-04-19: 500 mg via ORAL
  Filled 2012-04-19: qty 1

## 2012-04-19 MED ORDER — WARFARIN SODIUM 5 MG PO TABS
5.0000 mg | ORAL_TABLET | Freq: Once | ORAL | Status: DC
  Filled 2012-04-19: qty 1

## 2012-04-19 NOTE — Progress Notes (Signed)
Pt d/c instructions reviewed with pt. Copy of instructions and scripts given to pt. Pt d/c'd via wheelchair with belongings escorted by hospital volunteer. Friend here with pt and taking pt home.

## 2012-04-19 NOTE — Progress Notes (Signed)
Patient evaluated for long-term disease management services with [facility] as a benefit of his [employer] insurance. However, upon visit to room, patient had already discharged. Patient will receive a post discharge transition of care call.  Raiford Noble, MSN- Ed, Charity fundraiser, BSN- [facility], [phone]

## 2012-04-19 NOTE — Progress Notes (Signed)
ANTICOAGULATION CONSULT NOTE - Follow Up Consult  Pharmacy Consult:  Coumadin Indication: history of mechanical AVR / atrial fibrillation   No Known Allergies  Patient Measurements: Height: 5\' 8"  (172.7 cm) Weight: 198 lb 3.1 oz (89.9 kg) IBW/kg (Calculated) : 68.4   Vital Signs: Temp: 97.7 F (36.5 C) (03/06 0506) Temp src: Oral (03/06 0506) BP: 115/56 mmHg (03/06 0506) Pulse Rate: 96 (03/06 0506)  Labs:  Recent Labs  04/17/12 1709 04/18/12 0412 04/19/12 0530  HGB 14.0 12.3*  --   HCT 40.4 35.1*  --   PLT 136* 118*  --   LABPROT 16.1* 17.4* 20.0*  INR 1.32 1.47 1.77*  CREATININE 1.16 1.09  --     Estimated Creatinine Clearance: 67.7 ml/min (by C-G formula based on Cr of 1.09).       Assessment: 60 YOM with history of mechanical AVR and Afib to continue on Coumadin from PTA.  Lovenox added while INR is sub-therapeutic.  Patient's renal function is stable; no bleeding reported.  Noted patient is on Levaquin, which could increase the effect of Coumadin.   Goal of Therapy:  INR 2.5 - 3.5 Monitor platelets by anticoagulation protocol: Yes    Plan:  - Repeat Coumadin 5mg  PO today - Continue Lovenox 140mg  SQ Q24H (~1.5 mg/kg/day) - Daily PT / INR     Thuy D. Laney Potash, PharmD, BCPS Pager:  [phone] 04/19/2012, 10:54 AM

## 2012-04-19 NOTE — Discharge Summary (Signed)
Physician Discharge Summary  Timothy Fritz QMV:[id number] DOB: 1940/06/08 DOA: 04/17/2012  PCP: [name], MD  Admit date: 04/17/2012 Discharge date: 04/19/2012    Recommendations for Outpatient Follow-up:  1. Follow up with urology  2. Please get repeat cbc in one week.   Discharge Diagnoses:  Principal Problem:   Urosepsis Active Problems:   Mixed hyperlipidemia   Atrial fibrillation   S/P aortic valve replacement with metallic valve   Long term (current) use of anticoagulants   UTI (lower urinary tract infection)   Discharge Condition: stable  Diet recommendation: low sodium diet  Filed Weights   04/17/12 2341  Weight: 89.9 kg (198 lb 3.1 oz)    History of present illness:   Timothy Fritz is an 72 y.o. male with hx of mechanical AVR, along with CABG, LBBB, CMP with progressive decline requiring recent cath, showing normal coronaries and working mechanical valve, presents to the ER with fever and malaise. He was found to have a UTI, and was found to be hypotensive. Fortunately, he did respond to IVF. His INR was found to be subtherapeutic at 1.2, and he has been bridging with Lovenox SQ. He has a WBC of 15K with Hb of 14g/DL. His renal fx tests were normal. Hospitalist was asked to admit him for urosepsis.  Hospital Course:  Sepsis from UTI: urine cultures grew 60,000 enterococcus and he received two days of rocephin. And sensitive to levaquin. He will be discharged on 73m ore days of levaquin to complete the course.   Hypotension: resolved with IV fluids. Normotensive on discharge.   Atrial fibrillation: on amiodarone, lovenox and coumadin. Rate controlled.  BPH: on flomax and started on proscar.   Discharge Exam: Filed Vitals:   04/18/12 1600 04/18/12 1833 04/18/12 2107 04/19/12 0506  BP: 103/68 125/76  115/56  Pulse: 79 93 96 96  Temp: 98.1 F (36.7 C) 98.3 F (36.8 C) 98.7 F (37.1 C) 97.7 F (36.5 C)  TempSrc: Oral Oral Oral Oral  Resp: 22 18 18 18    Height:      Weight:      SpO2: 97% 97% 98% 96%   Alert afebrile comfortable.  CHEST: Normal respiration, clear to auscultation bilaterally.  HEART: Regular rate and rhythm. There is mechanical valve and soft II/VI murmur.  BACK: No kyphosis or scoliosis; no CVA tenderness  ABDOMEN: soft and non-tender; no masses, no organomegaly, normal abdominal bowel sounds; no pannus; no intertriginous candida. There is no rebound and no distention.  EXTREMITIES: No bone or joint deformity; age-appropriate arthropathy of the hands and knees; no edema; no ulcerations. There is no calf tenderness      Discharge Instructions  Discharge Orders   Future Appointments Provider Department Dept Phone   04/20/2012 1:30 PM [facility]  [facility] (near [place]) [phone]   Future Orders Complete By Expires     Diet - low sodium heart healthy  As directed     Discharge instructions  As directed     Comments:      Follow up with urology in one week.        Medication List    TAKE these medications       amiodarone 200 MG tablet  Commonly known as:  PACERONE  Take 1 tablet (200 mg total) by mouth daily.     atorvastatin 20 MG tablet  Commonly known as:  LIPITOR  Take 20 mg by mouth every evening.     enoxaparin 150 MG/ML injection  Commonly  known as:  LOVENOX  Inject 140 mg into the skin daily. Stop 04/20/12     finasteride 5 MG tablet  Commonly known as:  PROSCAR  Take 1 tablet (5 mg total) by mouth daily.     Fish Oil 1000 MG Caps  Take 1,000 mg by mouth daily.     FLOMAX 0.4 MG Caps  Generic drug:  tamsulosin  Take 0.4 mg by mouth daily after supper.     furosemide 20 MG tablet  Commonly known as:  LASIX  Take 1 tablet (20 mg total) by mouth daily.     levofloxacin 500 MG tablet  Commonly known as:  LEVAQUIN  Take 1 tablet (500 mg total) by mouth daily.     losartan 50 MG tablet  Commonly known as:  COZAAR  Take 1 tablet (50 mg total) by mouth daily.      multivitamin with minerals Tabs  Take 1 tablet by mouth daily.     TOPROL XL 50 MG 24 hr tablet  Generic drug:  metoprolol succinate  Take 50 mg by mouth 2 (two) times daily.     warfarin 5 MG tablet  Commonly known as:  COUMADIN  Take 2.5-5 mg by mouth See admin instructions. 5mg  daily through 04/21/12; then 2.5mg  Monday, Wednesday, Friday, Sunday; 5mg  Saturday, Tuesday, Thursday           Follow-up Information   Follow up with [facility], MD In 1 week.   Contact information:   [address]  [place] [address] [phone]       Follow up with DAVIS III, RONALD L, MD. Schedule an appointment as soon as possible for a visit in 1 week.   Contact information:   [address] [place] [address] [phone]        The results of significant diagnostics from this hospitalization (including imaging, microbiology, ancillary and laboratory) are listed below for reference.    Significant Diagnostic Studies: Dg Chest 2 View  04/17/2012  *RADIOLOGY REPORT*  Clinical Data: Weakness, body aches, fever, pain, 1 day post heart catheterization  CHEST - 2 VIEW  Comparison: 10/17/2008  Findings: Enlargement of cardiac silhouette post AVR. Tortuous aorta. Pulmonary vascularity normal. Minimal left basilar scarring. Lungs otherwise clear. No pleural effusion or pneumothorax. No acute osseous findings.  IMPRESSION: Enlargement of cardiac silhouette post AVR. Minimal left basilar scarring.   Original Report Authenticated By: Ulyses Southward, M.D.     Microbiology: Recent Results (from the past 240 hour(s))  CULTURE, BLOOD (ROUTINE X 2)     Status: None   Collection Time    04/17/12  5:10 PM      Result Value Range Status   Specimen Description BLOOD ARM RIGHT   Final   Special Requests BOTTLES DRAWN AEROBIC AND ANAEROBIC 10CC   Final   Culture  Setup Time 04/18/2012 03:29   Final   Culture     Final   Value:        BLOOD CULTURE RECEIVED NO GROWTH TO DATE CULTURE WILL BE HELD FOR 5  DAYS BEFORE ISSUING A FINAL NEGATIVE REPORT   Report Status PENDING   Incomplete  CULTURE, BLOOD (ROUTINE X 2)     Status: None   Collection Time    04/17/12  5:20 PM      Result Value Range Status   Specimen Description BLOOD ARM LEFT   Final   Special Requests BOTTLES DRAWN AEROBIC AND ANAEROBIC 10CC   Final  Culture  Setup Time 04/18/2012 03:29   Final   Culture     Final   Value:        BLOOD CULTURE RECEIVED NO GROWTH TO DATE CULTURE WILL BE HELD FOR 5 DAYS BEFORE ISSUING A FINAL NEGATIVE REPORT   Report Status PENDING   Incomplete  URINE CULTURE     Status: None   Collection Time    04/17/12  5:51 PM      Result Value Range Status   Specimen Description URINE, CLEAN CATCH   Final   Special Requests NONE   Final   Culture  Setup Time 04/17/2012 19:11   Final   Colony Count 50,000 COLONIES/ML   Final   Culture ENTEROCOCCUS SPECIES   Final   Report Status 04/19/2012 FINAL   Final   Organism ID, Bacteria ENTEROCOCCUS SPECIES   Final  MRSA PCR SCREENING     Status: None   Collection Time    04/18/12 12:31 AM      Result Value Range Status   MRSA by PCR NEGATIVE  NEGATIVE Final   Comment:            The GeneXpert MRSA Assay (FDA     approved for NASAL specimens     only), is one component of a     comprehensive MRSA colonization     surveillance program. It is not     intended to diagnose MRSA     infection nor to guide or     monitor treatment for     MRSA infections.     Labs: Basic Metabolic Panel:  Recent Labs Lab 04/17/12 1709 04/18/12 0412  NA 135 137  K 4.2 3.7  CL 99 104  CO2 26 25  GLUCOSE 101* 95  BUN 15 15  CREATININE 1.16 1.09  CALCIUM 9.3 8.6   Liver Function Tests:  Recent Labs Lab 04/17/12 1709  AST 93*  ALT 99*  ALKPHOS 75  BILITOT 2.3*  PROT 6.9  ALBUMIN 3.9   No results found for this basename: LIPASE, AMYLASE,  in the last 168 hours No results found for this basename: AMMONIA,  in the last 168 hours CBC:  Recent Labs Lab  04/17/12 1709 04/18/12 0412  WBC 15.0* 14.8*  NEUTROABS 12.3*  --   HGB 14.0 12.3*  HCT 40.4 35.1*  MCV 89.0 86.5  PLT 136* 118*   Cardiac Enzymes: No results found for this basename: CKTOTAL, CKMB, CKMBINDEX, TROPONINI,  in the last 168 hours BNP: BNP (last 3 results) No results found for this basename: PROBNP,  in the last 8760 hours CBG: No results found for this basename: GLUCAP,  in the last 168 hours     Signed:  Kashauna Celmer  [facility] 04/19/2012, 12:01 PM

## 2012-04-19 NOTE — Care Management Note (Signed)
    Page 1 of 1   04/19/2012     4:12:09 PM   CARE MANAGEMENT NOTE 04/19/2012  Patient:  TRAVONTA, GILL   Account Number:  [id number]  Date Initiated:  04/19/2012  Documentation initiated by:  AMERSON,JULIE  Subjective/Objective Assessment:   PT S/P CATH 3/3 ADM WITH FEVER, PAINFUL URINATION.  PTA, PT INDEPENDENT.     Action/Plan:   WILL FOLLOW FOR HOME NEEDS AS PT PROGRESSES. NO HOME NEEDS ANTICIPATED.   Anticipated DC Date:  04/19/2012   Anticipated DC Plan:  HOME/SELF CARE      DC Planning Services  CM consult      Choice offered to / List presented to:             Status of service:  Completed, signed off Medicare Important Message given?   (If response is "NO", the following Medicare IM given date fields will be blank) Date Medicare IM given:   Date Additional Medicare IM given:    Discharge Disposition:  HOME/SELF CARE  Per UR Regulation:  Reviewed for med. necessity/level of care/duration of stay  If discussed at Long Length of Stay Meetings, dates discussed:    Comments:

## 2012-04-24 ENCOUNTER — Ambulatory Visit (INDEPENDENT_AMBULATORY_CARE_PROVIDER_SITE_OTHER): Payer: Medicare Other | Admitting: *Deleted

## 2012-04-24 DIAGNOSIS — Z954 Presence of other heart-valve replacement: Secondary | ICD-10-CM

## 2012-04-24 DIAGNOSIS — Z7901 Long term (current) use of anticoagulants: Secondary | ICD-10-CM

## 2012-04-24 DIAGNOSIS — I4891 Unspecified atrial fibrillation: Secondary | ICD-10-CM

## 2012-04-24 LAB — CULTURE, BLOOD (ROUTINE X 2)
Culture: NO GROWTH
Culture: NO GROWTH

## 2012-04-24 LAB — POCT INR: INR: 3.8

## 2012-04-27 ENCOUNTER — Other Ambulatory Visit: Payer: Self-pay | Admitting: Cardiology

## 2012-04-27 MED ORDER — AMIODARONE HCL 200 MG PO TABS: 200.0000 mg | ORAL_TABLET | Freq: Every day | ORAL | Status: DC

## 2012-04-27 MED ORDER — ATORVASTATIN CALCIUM 20 MG PO TABS
20.0000 mg | ORAL_TABLET | Freq: Every evening | ORAL | Status: DC
Start: 2012-04-27 — End: 2012-05-02

## 2012-05-02 ENCOUNTER — Other Ambulatory Visit: Payer: Self-pay | Admitting: Cardiology

## 2012-05-02 MED ORDER — ATORVASTATIN CALCIUM 20 MG PO TABS: 20.0000 mg | ORAL_TABLET | Freq: Every evening | ORAL | Status: DC

## 2012-05-02 MED ORDER — AMIODARONE HCL 200 MG PO TABS: 200.0000 mg | ORAL_TABLET | Freq: Every day | ORAL | Status: DC

## 2012-05-21 ENCOUNTER — Encounter: Payer: Self-pay | Admitting: Cardiology

## 2012-05-21 ENCOUNTER — Ambulatory Visit (INDEPENDENT_AMBULATORY_CARE_PROVIDER_SITE_OTHER): Payer: Medicare Other | Admitting: Cardiology

## 2012-05-21 VITALS — BP 123/77 | HR 84 | Ht 68.0 in | Wt 193.0 lb

## 2012-05-21 DIAGNOSIS — I4891 Unspecified atrial fibrillation: Secondary | ICD-10-CM

## 2012-05-21 DIAGNOSIS — Z954 Presence of other heart-valve replacement: Secondary | ICD-10-CM

## 2012-05-21 DIAGNOSIS — I429 Cardiomyopathy, unspecified: Secondary | ICD-10-CM

## 2012-05-21 DIAGNOSIS — I10 Essential (primary) hypertension: Secondary | ICD-10-CM

## 2012-05-21 NOTE — Patient Instructions (Addendum)
Your physician recommends that you schedule a follow-up appointment in: 3 months. You will receive a reminder letter in the mail in about 1-2 months reminding you to call and schedule your appointment. If you don't receive this letter, please contact our office. Your physician recommends that you continue on your current medications as directed. Please refer to the Current Medication list given to you today. You have been referred to our EP clinic at the [facility]. Office in [place].

## 2012-05-21 NOTE — Assessment & Plan Note (Signed)
Continues on Coumadin, history of mechanical AVR with Bentall procedure at [facility] in 2002. Valve function has been stable.

## 2012-05-21 NOTE — Assessment & Plan Note (Signed)
History of paroxysmal atrial fibrillation and flutter over time, recently more persistent atrial fibrillation, although rates have been well controlled. We have not pursued cardioversion at this point, although he continues on amiodarone. We either need to discontinue amiodarone going forward and continue strategy of heart rate control and anticoagulation, or pursue more aggressive attempts at maintaining sinus rhythm, which may be difficult in light of his cardiac substrate. Appreciate EP opinion on this.

## 2012-05-21 NOTE — Assessment & Plan Note (Signed)
Nonischemic, recent cardiac catheterization demonstrated widely patent coronary arteries. Most recent LVEF 25-30% with progressive decline over time. Plan to continue medical therapy, refer to electrophysiology to discuss prophylactic ICD placement. He does have a left bundle branch block at baseline, biventricular device could be considered.

## 2012-05-21 NOTE — Assessment & Plan Note (Signed)
Reasonable blood pressure control today. No changes made. 

## 2012-05-21 NOTE — Progress Notes (Signed)
Clinical Summary Timothy Fritz is a 72 y.o.male last seen in February. He has shown decline in LVEF over time, medical therapy has been adjusted, and he was ultimately referred for a cardiac catheterization.  Echocardiogram report from outside hospital in [place] back in February revealed mild to moderate LV dilatation with mild LVH, LVEF 25-30%, mild left atrial enlargement, mild mitral regurgitation, mechanical aortic prosthesis with trace aortic regurgitation, mild to moderate tricuspid regurgitation with RVSP 31 mm mercury.  Cardiac catheterization in early March revealed wildly patent coronaries with minimal irregularities, mildly elevated right heart pressures with large V wave, normal fluoroscopic appearance of mechanical aortic prosthesis. Incidentally, he was noted to have an episode of urosepsis following the procedure, hospital admission with cultures growing enterococcus treated with broad-spectrum antibiotics. He does have a history of prostatic hypertrophy, follows with a urologist whom he has since seen.  We discussed the results of his cardiac catheterization. He reports compliance with his medications. Over time he has clearly had episodic atrial fibrillation and flutter, although rates have been reasonably well controlled, and his decline in LVEF began prior to him being more in persistent atrial fibrillation. Tachycardia mediated cardiomyopathy is not highly suspected, and we have not pursued a cardioversion attempt, particularly with dilated left atrium. He has remained on amiodarone.  He reports no significant palpitations, no sudden dizziness or syncope. We discussed referral to electrophysiology.  No Known Allergies  Current Outpatient Prescriptions  Medication Sig Dispense Refill  . amiodarone (PACERONE) 200 MG tablet Take 1 tablet (200 mg total) by mouth daily.  90 tablet  3  . atorvastatin (LIPITOR) 20 MG tablet Take 1 tablet (20 mg total) by mouth every evening.  90  tablet  3  . finasteride (PROSCAR) 5 MG tablet Take 5 mg by mouth as needed.      . furosemide (LASIX) 20 MG tablet Take 1 tablet (20 mg total) by mouth daily.  90 tablet  3  . losartan (COZAAR) 50 MG tablet Take 1 tablet (50 mg total) by mouth daily.  90 tablet  3  . metoprolol (TOPROL XL) 50 MG 24 hr tablet Take 50 mg by mouth 2 (two) times daily.       . Multiple Vitamin (MULTIVITAMIN WITH MINERALS) TABS Take 1 tablet by mouth daily.      . Omega-3 Fatty Acids (FISH OIL) 1000 MG CAPS Take 1,000 mg by mouth daily.       . Tamsulosin HCl (FLOMAX) 0.4 MG CAPS Take 0.4 mg by mouth daily after supper.       . warfarin (COUMADIN) 5 MG tablet Take 2.5-5 mg by mouth See admin instructions. 5mg  daily through 04/21/12; then 2.5mg  Monday, Wednesday, Friday, Sunday; 5mg  Saturday, Tuesday, Thursday       No current facility-administered medications for this visit.    Past Medical History  Diagnosis Date  . Atrial fibrillation     Paroyxsmal atrial fibrillation and atrial flutter  . Coronary artery disease     Nonobstructive at cardiac catherization 2002  . Mixed hyperlipidemia   . Essential hypertension, benign   . Left bundle branch block   . BPH (benign prostatic hypertrophy)   . Cardiomyopathy     Nonischemic (EF 45%)  . Aortic valve, bicuspid     Mechanical AVR  . Aortic aneurysm, thoracic     Fusiform s/p Bentall procedure 2002  . Cancer     Past Surgical History  Procedure Laterality Date  . Bentall procedure  [facility], 2002  . Tonsillectomy      Social History Timothy Fritz reports that he quit smoking about 12 years ago. His smoking use included Cigarettes. He smoked 0.00 packs per day. He has never used smokeless tobacco. Timothy Fritz reports that  drinks alcohol.  Review of Systems Urinary symptoms have resolved. No fevers or chills. Otherwise negative except as outlined.  Physical Examination Filed Vitals:   05/21/12 1457  BP: 123/77  Pulse: 84   Filed Weights    05/21/12 1457  Weight: 193 lb (87.544 kg)    No acute distress.  HEENT: Conjunctiva and lids normal, oropharynx clear.  Neck: Supple, no carotid bruits, no thyromegaly.  Lungs: Clear to auscultation, nonlabored.  Cardiac: Irregularly irregular, crisp prosthetic mechanical aortic valve click, no diastolic murmur, no S3 gallop.  Abdomen: Soft, nontender, no hepatomegaly, no bruits.  Extremities: No significant pitting edema, few small varicosities. distal pulses are 2+.  Skin: Warm and dry.  Musculoskeletal: No kyphosis.  Neuropsychiatric: Alert and oriented x3, affect appropriate.   Problem List and Plan   UNSPECIFIED SECONDARY CARDIOMYOPATHY Nonischemic, recent cardiac catheterization demonstrated widely patent coronary arteries. Most recent LVEF 25-30% with progressive decline over time. Plan to continue medical therapy, refer to electrophysiology to discuss prophylactic ICD placement. He does have a left bundle branch block at baseline, biventricular device could be considered.  Atrial fibrillation History of paroxysmal atrial fibrillation and flutter over time, recently more persistent atrial fibrillation, although rates have been well controlled. We have not pursued cardioversion at this point, although he continues on amiodarone. We either need to discontinue amiodarone going forward and continue strategy of heart rate control and anticoagulation, or pursue more aggressive attempts at maintaining sinus rhythm, which may be difficult in light of his cardiac substrate. Appreciate EP opinion on this.  S/P aortic valve replacement with metallic valve Continues on Coumadin, history of mechanical AVR with Bentall procedure at [facility] in 2002. Valve function has been stable.  ESSENTIAL HYPERTENSION, BENIGN Reasonable blood pressure control today. No changes made.    [name], M.D., F.A.C.C.

## 2012-07-02 ENCOUNTER — Encounter: Payer: Self-pay | Admitting: Internal Medicine

## 2012-07-02 ENCOUNTER — Ambulatory Visit (INDEPENDENT_AMBULATORY_CARE_PROVIDER_SITE_OTHER): Payer: Medicare Other | Admitting: Internal Medicine

## 2012-07-02 ENCOUNTER — Telehealth: Payer: Self-pay | Admitting: Internal Medicine

## 2012-07-02 VITALS — BP 139/82 | HR 62 | Ht 68.0 in | Wt 195.5 lb

## 2012-07-02 DIAGNOSIS — I519 Heart disease, unspecified: Secondary | ICD-10-CM

## 2012-07-02 DIAGNOSIS — I5042 Chronic combined systolic (congestive) and diastolic (congestive) heart failure: Secondary | ICD-10-CM | POA: Insufficient documentation

## 2012-07-02 DIAGNOSIS — I447 Left bundle-branch block, unspecified: Secondary | ICD-10-CM

## 2012-07-02 DIAGNOSIS — I429 Cardiomyopathy, unspecified: Secondary | ICD-10-CM

## 2012-07-02 DIAGNOSIS — I4891 Unspecified atrial fibrillation: Secondary | ICD-10-CM

## 2012-07-02 NOTE — Telephone Encounter (Signed)
New problem     Was seen in the office today calling back to speak with nurse.

## 2012-07-02 NOTE — Patient Instructions (Addendum)
Your physician has recommended that you have a Bi V-defibrillator inserted. An implantable cardioverter defibrillator (ICD) is a small device that is placed in your chest or, in rare cases, your abdomen. This device uses electrical pulses or shocks to help control life-threatening, irregular heartbeats that could lead the heart to suddenly stop beating (sudden cardiac arrest). Leads are attached to the ICD that goes into your heart. This is done in the hospital and usually requires an overnight stay. Please see the instruction sheet given to you today for more information.

## 2012-07-02 NOTE — Progress Notes (Signed)
Primary Care Physician: [name], MD Referring Physician:  Dr Selena Lesser is a 72 y.o. male with a h/o valvular heart disease s/p mechanical AVR/ bentall procedure, persistent atrial fibrillation, LBBB, and nonischemic CM (EF 25%) who presents for EP consultation.  He reports having atrial fibrillation for years.  This has increased to persistent despite amiodarone.  He is s/p Bentall procedure and AVR by Dr Laneta Simmers in 2002.  He reports progressive decline in exercise tolerance over the past few years.  He has been found to have depressed EF (mostly recently 25% by echo 03/23/12).  His EF has been supressed since at least 2010.  Cath 3/14 reveals no significant CAD.  No reversible causes for his cardiomyopathy have been found and heart rates have been controlled.  He does not drink frequent ETOH.   He reports decreased exercise tolerance.  Presently, he is SOB with moderate activity.  He finds that when mowing his lawn or doing yard work fatigues him quickly.   Today, he denies symptoms of palpitations, chest pain, shortness of breath, orthopnea, PND, lower extremity edema, dizziness, presyncope, syncope, or neurologic sequela. The patient is tolerating medications without difficulties and is otherwise without complaint today.   Past Medical History  Diagnosis Date  . Persistent atrial fibrillation   . Coronary artery disease     Nonobstructive at cardiac catherization 2002  . Mixed hyperlipidemia   . Essential hypertension, benign   . Left bundle branch block   . BPH (benign prostatic hypertrophy)   . Cardiomyopathy     Nonischemic   . Aortic valve, bicuspid     Mechanical AVR  . Aortic aneurysm, thoracic     Fusiform s/p Bentall procedure 2002  . Skin cancer    Past Surgical History  Procedure Laterality Date  . Bentall procedure      Dr Laneta Simmers 2002  . Tonsillectomy      Current Outpatient Prescriptions  Medication Sig Dispense Refill  . amiodarone (PACERONE)  200 MG tablet Take 1 tablet (200 mg total) by mouth daily.  90 tablet  3  . atorvastatin (LIPITOR) 20 MG tablet Take 1 tablet (20 mg total) by mouth every evening.  90 tablet  3  . finasteride (PROSCAR) 5 MG tablet Take 5 mg by mouth as needed.      . furosemide (LASIX) 20 MG tablet Take 1 tablet (20 mg total) by mouth daily.  90 tablet  3  . losartan (COZAAR) 50 MG tablet Take 1 tablet (50 mg total) by mouth daily.  90 tablet  3  . metoprolol (TOPROL XL) 50 MG 24 hr tablet Take 50 mg by mouth 2 (two) times daily.       . Multiple Vitamin (MULTIVITAMIN WITH MINERALS) TABS Take 1 tablet by mouth daily.      . Omega-3 Fatty Acids (FISH OIL) 1000 MG CAPS Take 1,000 mg by mouth daily.       . Tamsulosin HCl (FLOMAX) 0.4 MG CAPS Take 0.4 mg by mouth daily after supper.       . warfarin (COUMADIN) 5 MG tablet Take 2.5-5 mg by mouth See admin instructions. 5mg  daily through 04/21/12; then 2.5mg  Monday, Wednesday, Friday, Sunday; 5mg  Saturday, Tuesday, Thursday       No current facility-administered medications for this visit.    No Known Allergies  History   Social History  . Marital Status: Single    Spouse Name: N/A    Number of Children: N/A  .  Years of Education: N/A   Occupational History  . Nutritional therapist    Social History Main Topics  . Smoking status: Former Smoker    Types: Cigarettes    Quit date: 02/15/2000  . Smokeless tobacco: Never Used  . Alcohol Use: Yes     Comment: social  . Drug Use: No  . Sexually Active: Not on file   Other Topics Concern  . Not on file   Social History Narrative   Widow, Lives in [place]   Regular Exercise --yes   Retired Nutritional therapist   Attends [employer]    Family History  Problem Relation Age of Onset  . Coronary artery disease Father     ROS- All systems are reviewed and negative except as per the HPI above  Physical Exam: Filed Vitals:   07/02/12 1119  BP: 139/82  Pulse: 62  Height: 5\' 8"  (1.727 m)   Weight: 195 lb 8 oz (88.678 kg)    GEN- The patient is well appearing, alert and oriented x 3 today.   Head- normocephalic, atraumatic Eyes-  Sclera clear, conjunctiva pink Ears- hearing intact Oropharynx- clear Neck- supple, no JVP Lymph- no cervical lymphadenopathy Lungs- Clear to ausculation bilaterally, normal work of breathing Heart- irregular rate and rhythm, no murmurs, rubs or gallops, PMI not laterally displaced GI- soft, NT, ND, + BS Extremities- no clubbing, cyanosis, or edema MS- no significant deformity or atrophy Skin- no rash or lesion Psych- euthymic mood, full affect Neuro- strength and sensation are intact  EKG today reveals afib, V rate 70 bpm, LBBB(QRS 184 msec)  Assessment and Plan:  1. Nonischemic CM/ chronic systolic dysfunction/ LBBB The patient has a nonischemic CM (EF 25%), NYHA Class III CHF, and LBBB. At this time, he meets SCD-HeFT criteria for ICD implantation for primary prevention of sudden death.  He has a LBBB with QRS > and therefore may benefit from CRT as well.  Risks, benefits, alternatives to BiV ICD implantation were discussed in detail with the patient today. The patient  understands that the risks include but are not limited to bleeding, infection, pneumothorax, perforation, tamponade, vascular damage, renal failure, MI, stroke, death, inappropriate shocks, and lead dislodgement and wishes to proceed.  We will therefore schedule device implantation at the next available time.  2. Persistent atrial fibrillation Continue amiodarone at this time Chronic anticoagulation Will anticipate cardioversion at time of device placement.  If he has further afib at that point, I think we would stop amiodarone and likely rate control long term

## 2012-07-03 ENCOUNTER — Encounter: Payer: Self-pay | Admitting: Nurse Practitioner

## 2012-07-03 ENCOUNTER — Telehealth: Payer: Self-pay | Admitting: Internal Medicine

## 2012-07-03 NOTE — Telephone Encounter (Signed)
New Prob     Pt is wanting to schedule his defib surgery. Please call.

## 2012-07-03 NOTE — Telephone Encounter (Signed)
Spoke with patient and explained that Dennis Bast, RN is not in the office today but that I would forward the message to her for her to f/u tomorrow.  Patient states they offered him an appointment for procedure to be done next Tuesday and this is what he would like to do.  Patient also wants instructions on stopping Coumadin.

## 2012-07-04 NOTE — Telephone Encounter (Signed)
Procedure scheduled for 07/12/12.  lmom to call me for details.  I have also faxed orders for him to have his labs drawn at Dr Leandro Reasoner office

## 2012-07-04 NOTE — Telephone Encounter (Signed)
New Prob      Pt would like to speak to nurse regarding upcoming procedure at the hospital.

## 2012-07-05 ENCOUNTER — Other Ambulatory Visit: Payer: Self-pay | Admitting: *Deleted

## 2012-07-05 ENCOUNTER — Other Ambulatory Visit: Payer: Self-pay | Admitting: Internal Medicine

## 2012-07-05 ENCOUNTER — Encounter: Payer: Self-pay | Admitting: *Deleted

## 2012-07-05 DIAGNOSIS — I428 Other cardiomyopathies: Secondary | ICD-10-CM

## 2012-07-05 DIAGNOSIS — I429 Cardiomyopathy, unspecified: Secondary | ICD-10-CM

## 2012-07-05 NOTE — Telephone Encounter (Signed)
lmom for all his instructions

## 2012-07-05 NOTE — Telephone Encounter (Signed)
lmom for patient, he is scheduled for 07/12/12 and will have labwork done at Dr Alver Fisher office today or tomorrow and another INR checked for 5/28.

## 2012-07-06 ENCOUNTER — Telehealth: Payer: Self-pay | Admitting: Internal Medicine

## 2012-07-06 ENCOUNTER — Encounter: Payer: Self-pay | Admitting: Internal Medicine

## 2012-07-06 ENCOUNTER — Encounter (HOSPITAL_COMMUNITY): Payer: Self-pay | Admitting: Pharmacy Technician

## 2012-07-06 NOTE — Telephone Encounter (Signed)
Patient aware of date and time  His INR was 2.8 today and his dose was decreased, He will have another INR drawn on 07/11/12

## 2012-07-06 NOTE — Telephone Encounter (Signed)
New Problem  Pt is calling back stating that he wants to speak with you regarding his surgery. He asked if you could give him a call back.

## 2012-07-10 NOTE — Telephone Encounter (Signed)
Spoke with patient, he has had a cold and was put on antibiotics and is much better.  No fever, he had his labs done and will have a repeat INR drawn tomorrow

## 2012-07-10 NOTE — Telephone Encounter (Signed)
F/u   Pt calling w/question regarding surgery

## 2012-07-11 DIAGNOSIS — Z95810 Presence of automatic (implantable) cardiac defibrillator: Secondary | ICD-10-CM

## 2012-07-11 HISTORY — DX: Presence of automatic (implantable) cardiac defibrillator: Z95.810

## 2012-07-11 HISTORY — PX: BI-VENTRICULAR IMPLANTABLE CARDIOVERTER DEFIBRILLATOR  (CRT-D): SHX5747

## 2012-07-11 MED ORDER — SODIUM CHLORIDE 0.9 % IV SOLN
INTRAVENOUS | Status: DC
  Administered 2012-07-12: 09:00:00 via INTRAVENOUS

## 2012-07-11 MED ORDER — CEFAZOLIN SODIUM-DEXTROSE 2-3 GM-% IV SOLR
2.0000 g | INTRAVENOUS | Status: DC
  Filled 2012-07-11 (×2): qty 50

## 2012-07-11 MED ORDER — SODIUM CHLORIDE 0.9 % IR SOLN
80.0000 mg | Status: DC
  Filled 2012-07-11: qty 2

## 2012-07-11 NOTE — Telephone Encounter (Signed)
lmom for patient to call me back with his INR results from today

## 2012-07-11 NOTE — Telephone Encounter (Signed)
INR was 2.3 today  Dr Johney Frame aware

## 2012-07-11 NOTE — Telephone Encounter (Signed)
New Prob      Pt calling in following up on test results. Please call.

## 2012-07-12 ENCOUNTER — Encounter (HOSPITAL_COMMUNITY)
Admission: RE | Disposition: A | Discharge status: Home / Self Care | Payer: Self-pay | Source: Ambulatory Visit | Attending: Internal Medicine

## 2012-07-12 ENCOUNTER — Encounter (HOSPITAL_COMMUNITY): Payer: Self-pay | Admitting: General Practice

## 2012-07-12 ENCOUNTER — Ambulatory Visit (HOSPITAL_COMMUNITY)
Admission: RE | Admit: 2012-07-12 | Discharge: 2012-07-13 | Disposition: A | Discharge status: Home / Self Care | Payer: Medicare Other | Source: Ambulatory Visit | Attending: Internal Medicine | Admitting: Internal Medicine

## 2012-07-12 DIAGNOSIS — I10 Essential (primary) hypertension: Secondary | ICD-10-CM | POA: Insufficient documentation

## 2012-07-12 DIAGNOSIS — Z79899 Other long term (current) drug therapy: Secondary | ICD-10-CM | POA: Insufficient documentation

## 2012-07-12 DIAGNOSIS — I5042 Chronic combined systolic (congestive) and diastolic (congestive) heart failure: Secondary | ICD-10-CM | POA: Diagnosis present

## 2012-07-12 DIAGNOSIS — Z954 Presence of other heart-valve replacement: Secondary | ICD-10-CM | POA: Insufficient documentation

## 2012-07-12 DIAGNOSIS — I2510 Atherosclerotic heart disease of native coronary artery without angina pectoris: Secondary | ICD-10-CM | POA: Diagnosis present

## 2012-07-12 DIAGNOSIS — I5022 Chronic systolic (congestive) heart failure: Secondary | ICD-10-CM

## 2012-07-12 DIAGNOSIS — I509 Heart failure, unspecified: Secondary | ICD-10-CM | POA: Insufficient documentation

## 2012-07-12 DIAGNOSIS — N400 Enlarged prostate without lower urinary tract symptoms: Secondary | ICD-10-CM | POA: Insufficient documentation

## 2012-07-12 DIAGNOSIS — I447 Left bundle-branch block, unspecified: Secondary | ICD-10-CM | POA: Diagnosis present

## 2012-07-12 DIAGNOSIS — E782 Mixed hyperlipidemia: Secondary | ICD-10-CM | POA: Insufficient documentation

## 2012-07-12 DIAGNOSIS — Z7901 Long term (current) use of anticoagulants: Secondary | ICD-10-CM | POA: Insufficient documentation

## 2012-07-12 DIAGNOSIS — I4901 Ventricular fibrillation: Secondary | ICD-10-CM

## 2012-07-12 DIAGNOSIS — I4891 Unspecified atrial fibrillation: Secondary | ICD-10-CM | POA: Diagnosis present

## 2012-07-12 DIAGNOSIS — I428 Other cardiomyopathies: Secondary | ICD-10-CM

## 2012-07-12 DIAGNOSIS — I429 Cardiomyopathy, unspecified: Secondary | ICD-10-CM

## 2012-07-12 HISTORY — PX: BI-VENTRICULAR IMPLANTABLE CARDIOVERTER DEFIBRILLATOR: SHX5459

## 2012-07-12 HISTORY — DX: Basal cell carcinoma of skin of unspecified parts of face: C44.310

## 2012-07-12 LAB — PROTIME-INR: INR: 2.11 — ABNORMAL HIGH (ref 0.00–1.49)

## 2012-07-12 SURGERY — BI-VENTRICULAR IMPLANTABLE CARDIOVERTER DEFIBRILLATOR  (CRT-D)
Anesthesia: LOCAL

## 2012-07-12 MED ORDER — WARFARIN - PHYSICIAN DOSING INPATIENT: Freq: Every day | Status: DC

## 2012-07-12 MED ORDER — WARFARIN SODIUM 2.5 MG PO TABS: 2.5000 mg | ORAL_TABLET | Freq: Every evening | ORAL | Status: DC

## 2012-07-12 MED ORDER — TAMSULOSIN HCL 0.4 MG PO CAPS
0.4000 mg | ORAL_CAPSULE | Freq: Every day | ORAL | Status: DC
  Administered 2012-07-12: 19:00:00 0.4 mg via ORAL
  Filled 2012-07-12 (×2): qty 1

## 2012-07-12 MED ORDER — YOU HAVE A PACEMAKER BOOK
Freq: Once | Status: AC
  Administered 2012-07-12: 21:00:00
  Filled 2012-07-12: qty 1

## 2012-07-12 MED ORDER — FENTANYL CITRATE 0.05 MG/ML IJ SOLN
INTRAMUSCULAR | Status: AC
  Filled 2012-07-12: qty 2

## 2012-07-12 MED ORDER — AMIODARONE HCL 200 MG PO TABS
200.0000 mg | ORAL_TABLET | Freq: Every day | ORAL | Status: DC
  Administered 2012-07-12: 18:00:00 200 mg via ORAL
  Filled 2012-07-12 (×2): qty 1

## 2012-07-12 MED ORDER — HYDROCODONE-ACETAMINOPHEN 5-325 MG PO TABS
1.0000 | ORAL_TABLET | ORAL | Status: DC | PRN
  Administered 2012-07-13: 1 via ORAL
  Filled 2012-07-12: qty 1

## 2012-07-12 MED ORDER — WARFARIN SODIUM 2.5 MG PO TABS
2.5000 mg | ORAL_TABLET | ORAL | Status: DC
  Filled 2012-07-12: qty 1

## 2012-07-12 MED ORDER — SODIUM CHLORIDE 0.9 % IJ SOLN: 3.0000 mL | INTRAMUSCULAR | Status: DC | PRN

## 2012-07-12 MED ORDER — MIDAZOLAM HCL 5 MG/5ML IJ SOLN
INTRAMUSCULAR | Status: AC
  Filled 2012-07-12: qty 5

## 2012-07-12 MED ORDER — MUPIROCIN 2 % EX OINT
TOPICAL_OINTMENT | CUTANEOUS | Status: AC
  Filled 2012-07-12: qty 22

## 2012-07-12 MED ORDER — SODIUM CHLORIDE 0.9 % IV SOLN: 250.0000 mL | INTRAVENOUS | Status: DC | PRN

## 2012-07-12 MED ORDER — CEFAZOLIN SODIUM 1-5 GM-% IV SOLN
1.0000 g | Freq: Four times a day (QID) | INTRAVENOUS | Status: AC
  Administered 2012-07-12 – 2012-07-13 (×3): 1 g via INTRAVENOUS
  Filled 2012-07-12 (×3): qty 50

## 2012-07-12 MED ORDER — MUPIROCIN 2 % EX OINT
TOPICAL_OINTMENT | Freq: Two times a day (BID) | CUTANEOUS | Status: DC
  Administered 2012-07-12: 1 via NASAL

## 2012-07-12 MED ORDER — LOSARTAN POTASSIUM 50 MG PO TABS
50.0000 mg | ORAL_TABLET | Freq: Every day | ORAL | Status: DC
  Administered 2012-07-12 – 2012-07-13 (×2): 50 mg via ORAL
  Filled 2012-07-12 (×2): qty 1

## 2012-07-12 MED ORDER — SODIUM CHLORIDE 0.9 % IJ SOLN: 3.0000 mL | Freq: Two times a day (BID) | INTRAMUSCULAR | Status: DC

## 2012-07-12 MED ORDER — HEPARIN (PORCINE) IN NACL 2-0.9 UNIT/ML-% IJ SOLN
INTRAMUSCULAR | Status: AC
  Filled 2012-07-12: qty 500

## 2012-07-12 MED ORDER — WARFARIN SODIUM 5 MG PO TABS
5.0000 mg | ORAL_TABLET | ORAL | Status: DC
  Administered 2012-07-12: 19:00:00 5 mg via ORAL
  Filled 2012-07-12: qty 1

## 2012-07-12 MED ORDER — ONDANSETRON HCL 4 MG/2ML IJ SOLN: 4.0000 mg | Freq: Four times a day (QID) | INTRAMUSCULAR | Status: DC | PRN

## 2012-07-12 MED ORDER — LIDOCAINE HCL (PF) 1 % IJ SOLN
INTRAMUSCULAR | Status: AC
  Filled 2012-07-12: qty 60

## 2012-07-12 MED ORDER — ACETAMINOPHEN 325 MG PO TABS
325.0000 mg | ORAL_TABLET | ORAL | Status: DC | PRN
  Administered 2012-07-12: 650 mg via ORAL
  Filled 2012-07-12: qty 2

## 2012-07-12 MED ORDER — WARFARIN SODIUM 2.5 MG PO TABS
2.5000 mg | ORAL_TABLET | Freq: Every evening | ORAL | Status: DC
  Filled 2012-07-12: qty 2

## 2012-07-12 NOTE — Op Note (Signed)
SURGEON:  Hillis Range, MD      PREPROCEDURE DIAGNOSES:   1. Nonischemic cardiomyopathy.   2. New York Heart Association class III, heart failure chronically.   3. Left bundle-branch block.      POSTPROCEDURE DIAGNOSES:   1. Nonischemic cardiomyopathy.   2. New York Heart Association class III heart failure chronically.   3. Left bundle-branch block.      PROCEDURES:    1. Left upper extremity venography  2. Biventricular ICD implantation.  3. Defibrillation threshold testing     INTRODUCTION:  Timothy Fritz is a 72 y.o. male with a nonischemic CM (EF 25%), NYHA Class III CHF, and LBBB QRS morophology. At this time, he meets SCD-HeFT criteria for ICD implantation for primary prevention of sudden death.  Given LBBB, the patient may also be expected to benefit from resynchronization therapy.  The patient has been treated with an optimal medical regimen but continues to have a depressed ejection fraction and NYHA Class III CHF symptoms.  he therefore presents today for a biventricular ICD implantation.      DESCRIPTION OF PROCEDURE:  Informed written consent was obtained and the patient was brought to the electrophysiology lab in the fasting state. The patient was adequately sedated with intravenous Versed, and fentanyl as outlined in the nursing report.  The patient's left chest was prepped and draped in the usual sterile fashion by the EP lab staff.  The skin overlying the left deltopectoral region was infiltrated with lidocaine for local analgesia.  A 5-cm incision was made over the left deltopectoral region.  A left subcutaneous defibrillator pocket was fashioned using a combination of sharp and blunt dissection.  Electrocautery was used to assure hemostasis.   RA/RV Lead Placement: The left axillary vein was cannulated with fluoroscopic visualization.  No contrast was required for this endeavor.  Through the left axillary vein, a St. Jude Medical Tendril  model [phone]  (serial #  [id number] ) right atrial lead and a St. Jude Medical [place], model 9562Z-30 (serial number [id number]) right ventricular defibrillator lead were advanced with fluoroscopic visualization into the right atrial appendage and right ventricular apex positions respectively.  Initial atrial lead afib waves measured 0.6-1.3 mV with an impedance of 538 ohms.  The right ventricular lead R-wave measured 15.2 mV with impedance of 568 ohms and a threshold of 0.8 volts at 0.5 milliseconds.   LV Lead Placement: A Medtronic MB-2 guide was advanced through the left axillary vein into the low lateral right atrium.  A Bard curved Damato catheter was introduced through the MB-2 guide and used to cannulate the coronary sinus.  Coronary sinus cannulation was confirmed with electrogram recording from the hexapolar catheter.  A coronary sinus selective venography balloon was advanced through the MB- 2 guide and advanced into the proximal portion of the coronary sinus.  A selective coronary sinus venogram was performed by hand injection of nonionic contrast.  This demonstrated a large sized lateral coronary sinus branch.   A Whisper CSJ wire was introduced through the transseptal sheath and advanced into the distal portion of the lateral branch.  A St. Jude Medical Quartet model [id number] (serial number [id number]) lead was advanced through the MB-2 into the lateral branch.   This was  approximately two-thirds from the base to the apex in a very lateral position.  In this location, the left ventricular lead R-waves measured  24.8 mV with impedance of 741 ohms and a threshold of 0.8 volt at 0.5  milliseconds  in the bipolar M3-M4 configuration with no diaphragmatic  stimulation observed when pacing at 10 volts output.  The MB-2 guide was  therefore removed.    All three leads were secured to the pectoralis  fascia using #2 silk suture over the suture sleeves.  The pocket then  irrigated with copious gentamicin solution.  The leads were  then  connected to a St. Jude Medical [place] model CD [phone] (serial  Number [id number]) biventricular ICD.  The defibrillator was placed into the  pocket.  The pocket was then closed in 2 layers with 2.0 Vicryl suture  for the subcutaneous and subcuticular layers.  Steri-Strips and a  sterile dressing were then applied.   DFT Testing: Defibrillation Threshold testing was then performed. Ventricular fibrillation was induced with a T shock.  Adequate sensing of ventricular  fibrillation was observed with minimal dropout with a programmed sensitivity of 1.78mV.  The patient was successfully defibrillated to sinus rhythm with a single 15 joules shock delivered from the device with an impedance of 73 ohms in a duration of 4.5 seconds.  The patient remained in afib.  Cardioversion: The patient was successfully cardioverted to sinus rhythm with a single synchronized biphasic 360J shock delivered with cardioversion electrodes in the anterior/ posterior configuration.   He remained in sinus/ atrial paced rhythm thereafter.  There were no early apparent complications.   CONCLUSIONS:   1. Nonischemic cardiomyopathy with Left bundle-branch block and chronic New York Heart Association class III heart failure.   2. Successful biventricular ICD implantation.   3. DFT less than or equal to 15 joules.   4. Successful cardioversion to sinus rhythm  5. No early apparent complications.

## 2012-07-12 NOTE — Progress Notes (Signed)
ICD Criteria  Current LVEF (within 6 months):25%  NYHA Functional Classification: Class III  Heart Failure History:  Yes, Duration of heart failure since onset is > 9 months  Non-Ischemic Dilated Cardiomyopathy History:  Yes, timeframe is > 9 months  Atrial Fibrillation/Atrial Flutter:  Yes, A-Fib/A-Flutter type: Persistent (>7 days).  Ventricular Tachycardia History:  No.  Cardiac Arrest History:  No  History of Syndromes with Risk of Sudden Death:  No.  Previous ICD:  No.  Electrophysiology Study: No.  Anticoagulation Therapy:  Patient is on anticoagulation therapy, anticoagulation was NOT held prior to procedure.   Beta Blocker Therapy:  Yes.   Ace Inhibitor/ARB Therapy:  Yes.

## 2012-07-12 NOTE — H&P (View-Only) (Signed)
 Primary Care Physician: SHAH,ASHISH, MD Referring Physician:  Dr McDowell   Desmen T Hueber is a 72 y.o. male with a h/o valvular heart disease s/p mechanical AVR/ bentall procedure, persistent atrial fibrillation, LBBB, and nonischemic CM (EF 25%) who presents for EP consultation.  He reports having atrial fibrillation for years.  This has increased to persistent despite amiodarone.  He is s/p Bentall procedure and AVR by Dr Bartle in 2002.  He reports progressive decline in exercise tolerance over the past few years.  He has been found to have depressed EF (mostly recently 25% by echo 03/23/12).  His EF has been supressed since at least 2010.  Cath 3/14 reveals no significant CAD.  No reversible causes for his cardiomyopathy have been found and heart rates have been controlled.  He does not drink frequent ETOH.   He reports decreased exercise tolerance.  Presently, he is SOB with moderate activity.  He finds that when mowing his lawn or doing yard work fatigues him quickly.   Today, he denies symptoms of palpitations, chest pain, shortness of breath, orthopnea, PND, lower extremity edema, dizziness, presyncope, syncope, or neurologic sequela. The patient is tolerating medications without difficulties and is otherwise without complaint today.   Past Medical History  Diagnosis Date  . Persistent atrial fibrillation   . Coronary artery disease     Nonobstructive at cardiac catherization 2002  . Mixed hyperlipidemia   . Essential hypertension, benign   . Left bundle branch block   . BPH (benign prostatic hypertrophy)   . Cardiomyopathy     Nonischemic   . Aortic valve, bicuspid     Mechanical AVR  . Aortic aneurysm, thoracic     Fusiform s/p Bentall procedure 2002  . Skin cancer    Past Surgical History  Procedure Laterality Date  . Bentall procedure      Dr Bartle 2002  . Tonsillectomy      Current Outpatient Prescriptions  Medication Sig Dispense Refill  . amiodarone (PACERONE)  200 MG tablet Take 1 tablet (200 mg total) by mouth daily.  90 tablet  3  . atorvastatin (LIPITOR) 20 MG tablet Take 1 tablet (20 mg total) by mouth every evening.  90 tablet  3  . finasteride (PROSCAR) 5 MG tablet Take 5 mg by mouth as needed.      . furosemide (LASIX) 20 MG tablet Take 1 tablet (20 mg total) by mouth daily.  90 tablet  3  . losartan (COZAAR) 50 MG tablet Take 1 tablet (50 mg total) by mouth daily.  90 tablet  3  . metoprolol (TOPROL XL) 50 MG 24 hr tablet Take 50 mg by mouth 2 (two) times daily.       . Multiple Vitamin (MULTIVITAMIN WITH MINERALS) TABS Take 1 tablet by mouth daily.      . Omega-3 Fatty Acids (FISH OIL) 1000 MG CAPS Take 1,000 mg by mouth daily.       . Tamsulosin HCl (FLOMAX) 0.4 MG CAPS Take 0.4 mg by mouth daily after supper.       . warfarin (COUMADIN) 5 MG tablet Take 2.5-5 mg by mouth See admin instructions. 5mg daily through 04/21/12; then 2.5mg Monday, Wednesday, Friday, Sunday; 5mg Saturday, Tuesday, Thursday       No current facility-administered medications for this visit.    No Known Allergies  History   Social History  . Marital Status: Single    Spouse Name: N/A    Number of Children: N/A  .   Years of Education: N/A   Occupational History  . Insurance salesman    Social History Main Topics  . Smoking status: Former Smoker    Types: Cigarettes    Quit date: 02/15/2000  . Smokeless tobacco: Never Used  . Alcohol Use: Yes     Comment: social  . Drug Use: No  . Sexually Active: Not on file   Other Topics Concern  . Not on file   Social History Narrative   Widow, Lives in [place]   Regular Exercise --yes   Retired insurance salesman   Attends [employer]    Family History  Problem Relation Age of Onset  . Coronary artery disease Father     ROS- All systems are reviewed and negative except as per the HPI above  Physical Exam: Filed Vitals:   07/02/12 1119  BP: 139/82  Pulse: 62  Height: 5' 8" (1.727 m)   Weight: 195 lb 8 oz (88.678 kg)    GEN- The patient is well appearing, alert and oriented x 3 today.   Head- normocephalic, atraumatic Eyes-  Sclera clear, conjunctiva pink Ears- hearing intact Oropharynx- clear Neck- supple, no JVP Lymph- no cervical lymphadenopathy Lungs- Clear to ausculation bilaterally, normal work of breathing Heart- irregular rate and rhythm, no murmurs, rubs or gallops, PMI not laterally displaced GI- soft, NT, ND, + BS Extremities- no clubbing, cyanosis, or edema MS- no significant deformity or atrophy Skin- no rash or lesion Psych- euthymic mood, full affect Neuro- strength and sensation are intact  EKG today reveals afib, V rate 70 bpm, LBBB(QRS 184 msec)  Assessment and Plan:  1. Nonischemic CM/ chronic systolic dysfunction/ LBBB The patient has a nonischemic CM (EF 25%), NYHA Class III CHF, and LBBB. At this time, he meets SCD-HeFT criteria for ICD implantation for primary prevention of sudden death.  He has a LBBB with QRS > 150msec and therefore may benefit from CRT as well.  Risks, benefits, alternatives to BiV ICD implantation were discussed in detail with the patient today. The patient  understands that the risks include but are not limited to bleeding, infection, pneumothorax, perforation, tamponade, vascular damage, renal failure, MI, stroke, death, inappropriate shocks, and lead dislodgement and wishes to proceed.  We will therefore schedule device implantation at the next available time.  2. Persistent atrial fibrillation Continue amiodarone at this time Chronic anticoagulation Will anticipate cardioversion at time of device placement.  If he has further afib at that point, I think we would stop amiodarone and likely rate control long term  

## 2012-07-12 NOTE — Interval H&P Note (Signed)
History and Physical Interval Note:  07/12/2012 11:38 AM  Timothy Fritz  has presented today for surgery, with the diagnosis of cm  The various methods of treatment have been discussed with the patient and family. After consideration of risks, benefits and other options for treatment, the patient has consented to  Procedure(s): BI-VENTRICULAR IMPLANTABLE CARDIOVERTER DEFIBRILLATOR  (CRT-D) (N/A) as a surgical intervention .  The patient's history has been reviewed, patient examined, no change in status, stable for surgery.  I have reviewed the patient's chart and labs.  Questions were answered to the patient's satisfaction.     Hillis Range

## 2012-07-13 ENCOUNTER — Ambulatory Visit (HOSPITAL_COMMUNITY): Payer: Medicare Other

## 2012-07-13 LAB — BASIC METABOLIC PANEL
BUN: 22 mg/dL (ref 6–23)
Calcium: 8.8 mg/dL (ref 8.4–10.5)
GFR calc Af Amer: 68 mL/min — ABNORMAL LOW (ref >90)
GFR calc non Af Amer: 59 mL/min — ABNORMAL LOW (ref >90)
Glucose, Bld: 141 mg/dL — ABNORMAL HIGH (ref 70–99)
Sodium: 137 mEq/L (ref 135–145)

## 2012-07-13 LAB — PROTIME-INR: INR: 2.19 — ABNORMAL HIGH (ref 0.00–1.49)

## 2012-07-13 MED ORDER — METOPROLOL TARTRATE 50 MG PO TABS: ORAL_TABLET | ORAL | Status: DC

## 2012-07-13 NOTE — Discharge Summary (Signed)
ELECTROPHYSIOLOGY PROCEDURE DISCHARGE SUMMARY    Patient ID: Timothy Fritz,  MRN: [id number], DOB/AGE: September 04, 1940 72 y.o.  Admit date: 07/12/2012 Discharge date: 07/13/2012  Primary Care Physician: Beatrix Fetters Shah,MD Primary Cardiologist: Simona Huh, MD  Primary Discharge Diagnosis:  Non ischemic cardiomyopathy status post ICD implantation this admission  Secondary Discharge Diagnosis:  1.  Persistent atrial fibrillation- status post cardioversion this admission 2.  Non obstructive coronary disease by catheterization in 2002 3.  Hyperlipidemia 4.  Hypertension 5.  LBBB 6.  BPH 7.  Valvular disease s/p mechanical AVR 8.  Thoracic aortic aneurysm, s/p Bentall procedure in 2002  Procedures This Admission: 1.  Implantation of a STJ cardiac resynchronization therapy defibrillator on 07-12-2012 by Dr Johney Frame.  The patient received a STJ Fortify Quadra CRTD with model number 2088 right atrial lead, 7122Q right ventricular lead and 1458Q left ventricular lead.  DFT's were successful at 15J.  The patient also underwent cardioversion during the procedure. There were no early apparent complications. 2. CXR on 07-13-2012 reviewed by Dr Ladona Ridgel and demonstrated no pneumothorax  Brief HPI: Timothy Fritz is a 72 y.o. male with a nonischemic CM (EF 25%), NYHA Class III CHF, and LBBB QRS morophology. At this time, he meets SCD-HeFT criteria for ICD implantation for primary prevention of sudden death. Given LBBB, the patient may also be expected to benefit from resynchronization therapy. The patient has been treated with an optimal medical regimen but continues to have a depressed ejection fraction and NYHA Class III CHF symptoms. he therefore presents today for a biventricular ICD implantation.   Hospital Course:  The patient was admitted and underwent implantation of a STJ CRTD with details as outlined above.   He was monitored on telemetry overnight which demonstrated initially sinus rhythm with  ventricular pacing with reversion to atrial fibrillation.  Left chest was without hematoma or ecchymosis.  The device was interrogated and found to be functioning normally.  CXR was obtained and demonstrated no pneumothorax status post device implantation. On the day of discharge, the patient was without chest pain or shortness of breath. With failure of DCCV this admission on Amiodarone, this medication was discontinued. Toprol was increased to 100mg  every morning and 50mg  every evening for further rate control.  The patient will see Dr Sherryll Burger early next week for INR check.  Will plan follow up with Rick Duff, PA for wound check appt to evaluate need for further rate control. After that visit, patient will be seen in [place] office.  Wound care, arm mobility, and restrictions were reviewed with the patient.  Dr Ladona Ridgel examined the patient and considered them stable for discharge to home.   Discharge Vitals: Blood pressure 145/98, pulse 80, temperature 97.7 F (36.5 C), temperature source Oral, resp. rate 20, height 5\' 8"  (1.727 m), weight 188 lb 15 oz (85.7 kg), SpO2 97.00%.    Labs:     Recent Labs Lab 07/13/12 0425  NA 137  K 4.3  CL 103  CO2 24  BUN 22  CREATININE 1.20  CALCIUM 8.8  GLUCOSE 141*    Discharge Medications:    Medication List    STOP taking these medications       amiodarone 200 MG tablet  Commonly known as:  PACERONE      TAKE these medications       amoxicillin 500 MG capsule  Commonly known as:  AMOXIL  Take 500 mg by mouth 3 (three) times daily. 7 day course started 07/04/12  atorvastatin 20 MG tablet  Commonly known as:  LIPITOR  Take 1 tablet (20 mg total) by mouth every evening.     Fish Oil 1000 MG Caps  Take 1,000 mg by mouth daily.     FLOMAX 0.4 MG Caps  Generic drug:  tamsulosin  Take 0.4 mg by mouth daily after supper.     furosemide 20 MG tablet  Commonly known as:  LASIX  Take 20 mg by mouth daily at 2 PM daily at 2 PM.      losartan 50 MG tablet  Commonly known as:  COZAAR  Take 1 tablet (50 mg total) by mouth daily.     metoprolol 50 MG tablet  Commonly known as:  LOPRESSOR  Take 50 mg by mouth 2 (two) times daily.     multivitamin with minerals Tabs  Take 1 tablet by mouth daily.     warfarin 5 MG tablet  Commonly known as:  COUMADIN  Take 2.5-5 mg by mouth every evening. 1 tablet (5 mg) on Sunday, Tuesday and Thursday, 1/2 tablet (2.5 mg) on all other days        Follow Up appointments: Discharge Orders   Future Appointments Provider Department Dept Phone   07/24/2012 9:00 AM Minda Meo, PA-C [facility] [place]) [phone]   10/17/2012 9:00 AM Hillis Range, MD [facility] (near [place]) [phone]   Future Orders Complete By Expires     Diet - low sodium heart healthy  As directed     Discharge instructions  As directed     Comments:      Please see post device implant discharge instructions    Increase activity slowly  As directed       Follow-up Information   Follow up with Rick Duff, PA-C On 07/24/2012. (At 9:00 AM for wound check)    Contact information:   [facility] [address] [place] [address] [phone]      Follow up with Hillis Range, MD On 10/17/2012. (At 9:00 AM)    Contact information:   [facility] [address] [place] [address] [phone]      Follow up with [facility], MD On 07/17/2012. (As scheduled for Coumadin follow-up)    Contact information:   [address]  [place] [address] [phone]       Duration of Discharge Encounter: Greater than 30 minutes including physician time.  Signed, Gypsy Balsam, RN, BSN 07/13/2012, 8:20 AM  EP Attending Patient seen and examined. Agree with above note by Mrs. Glory Buff. He is s/p ICD implant and doing well. Ok for discharge with usual followup.  Leonia Reeves.D.

## 2012-07-16 ENCOUNTER — Telehealth: Payer: Self-pay | Admitting: *Deleted

## 2012-07-16 NOTE — Telephone Encounter (Signed)
Patient returned TCM phone call. States he is doing "okay". Returns for wound check appt on July 24, 2012, at 9:00am. Denies current problems, concerns or questions. Reviewed discharge instructions/medications.

## 2012-07-16 NOTE — Telephone Encounter (Signed)
TCM phone call - LMTCB.

## 2012-07-23 ENCOUNTER — Ambulatory Visit: Payer: Medicare Other

## 2012-07-24 ENCOUNTER — Encounter: Payer: Self-pay | Admitting: Internal Medicine

## 2012-07-24 ENCOUNTER — Encounter: Payer: Self-pay | Admitting: Cardiology

## 2012-07-24 ENCOUNTER — Ambulatory Visit (INDEPENDENT_AMBULATORY_CARE_PROVIDER_SITE_OTHER): Payer: Medicare Other | Admitting: Cardiology

## 2012-07-24 VITALS — BP 108/72 | HR 80 | Ht 68.0 in | Wt 189.6 lb

## 2012-07-24 DIAGNOSIS — Z95810 Presence of automatic (implantable) cardiac defibrillator: Secondary | ICD-10-CM

## 2012-07-24 DIAGNOSIS — I4891 Unspecified atrial fibrillation: Secondary | ICD-10-CM

## 2012-07-24 DIAGNOSIS — I5022 Chronic systolic (congestive) heart failure: Secondary | ICD-10-CM

## 2012-07-24 DIAGNOSIS — I519 Heart disease, unspecified: Secondary | ICD-10-CM

## 2012-07-24 DIAGNOSIS — I428 Other cardiomyopathies: Secondary | ICD-10-CM

## 2012-07-24 DIAGNOSIS — I429 Cardiomyopathy, unspecified: Secondary | ICD-10-CM

## 2012-07-24 LAB — ICD DEVICE OBSERVATION
BAMS-0001: 150 {beats}/min
DEVICE MODEL ICD: 7097390
FVT: 0
LV LEAD IMPEDENCE ICD: 660 Ohm
PACEART VT: 0
RV LEAD IMPEDENCE ICD: 480 Ohm
RV LEAD THRESHOLD: 1.25 V
RV LEAD THRESHOLD: 3.25 V
TZON-0003FASTVT: 300 ms
VF: 0

## 2012-07-24 NOTE — Patient Instructions (Addendum)
Your physician recommends that you keep your appointment with Dr. Johney Frame on 9/3/4 @ 9:00

## 2012-07-24 NOTE — Progress Notes (Signed)
ELECTROPHYSIOLOGY OFFICE NOTE  Patient ID: Timothy Fritz MRN: [id number], DOB/AGE: 1941-01-05   Date of Visit: 07/24/2012  Primary Physician: [name], MD Primary Cardiologist / EP: Diona Browner, MD / Johney Frame, MD Reason for Visit: Hospital follow-up HF, AF s/p recent CRT-D implant  History of Present Illness  Timothy Fritz is a pleasant 72 year old man with valvular heart disease s/p mechanical AVR/Bentall procedure, nonobstructive CAD, AF, nonischemic CM, EF 25%,, NYHA Class III HF, and LBBB. He has persistent LV dysfunction despite medical therapy and underwent CRT-D implant 07/12/2012. He was discharged 07/13/2012. At discharge he was in AF and Dr. Johney Frame increased his metoprolol dose to 100 mg qAM, 50 mg qPM.   He presents today for hospital followup. Since discharge from the hospital, he reports he is doing well. He states his heart rate has "been steady at 80" and he denies palpitations. He reports chronic DOE but states this is stable without worsening. He denies chest pain. He denies dizziness, near syncope or syncope. He denies LE swelling, orthopnea, PND or recent weight gain.   Past Medical History Past Medical History  Diagnosis Date  . Coronary artery disease     Nonobstructive at cardiac catherization 2002  . Mixed hyperlipidemia   . Essential hypertension, benign   . BPH (benign prostatic hypertrophy)   . Cardiomyopathy     Nonischemic   . Aortic valve, bicuspid     Mechanical AVR  . Aortic aneurysm, thoracic     Fusiform s/p Bentall procedure 2002  . Automatic implantable cardioverter-defibrillator in situ   . CHF (congestive heart failure)   . Exertional shortness of breath     "sometimes" (07/12/2012)  . Arthritis     "some in my back" (07/12/2012)  . Basal cell carcinoma of face     "under right eye, years ago" (07/12/2012)  . Persistent atrial fibrillation   . Left bundle branch block   . Paced rhythm on cardiac monitor     AV (07/12/2012)    Past  Surgical History Past Surgical History  Procedure Laterality Date  . Bentall procedure      Dr Laneta Simmers 2002  . Bi-ventricular implantable cardioverter defibrillator  (crt-d)  07/11/2012  . Tonsillectomy and adenoidectomy  1949  . Cardiac catheterization  04/2012  . Cardiac valve replacement      Allergies/Intolerances No Known Allergies  Current Home Medications Current Outpatient Prescriptions  Medication Sig Dispense Refill  . atorvastatin (LIPITOR) 20 MG tablet Take 1 tablet (20 mg total) by mouth every evening.  90 tablet  3  . furosemide (LASIX) 20 MG tablet Take 20 mg by mouth daily at 2 PM daily at 2 PM.      . losartan (COZAAR) 50 MG tablet Take 1 tablet (50 mg total) by mouth daily.  90 tablet  3  . metoprolol (LOPRESSOR) 50 MG tablet Take 2 tablets (100 mg) every morning and 1 tablet (50 mg) every evening.      . Multiple Vitamin (MULTIVITAMIN WITH MINERALS) TABS Take 1 tablet by mouth daily.      . Omega-3 Fatty Acids (FISH OIL) 1000 MG CAPS Take 1,000 mg by mouth daily.       . Tamsulosin HCl (FLOMAX) 0.4 MG CAPS Take 0.4 mg by mouth daily after supper.       . warfarin (COUMADIN) 5 MG tablet Take 2.5-5 mg by mouth every evening. 1 tablet (5 mg) on Sunday, Tuesday and Thursday, 1/2 tablet (2.5 mg) on all other  days       No current facility-administered medications for this visit.   Social History Social History  . Marital Status: Single   Social History Main Topics  . Smoking status: Former Smoker -- 1.00 packs/day for 41 years    Types: Cigarettes    Quit date: 02/15/2000  . Smokeless tobacco: Never Used  . Alcohol Use: 1.2 oz/week    2 Glasses of wine per week     Comment: 07/12/2012 "2-3 glasses twice/month"  . Drug Use: No   Social History Narrative   Widow, Lives in [place]   Regular Exercise --yes   Retired Nutritional therapist   Attends [employer]    Review of Systems General: No chills, fever, night sweats or weight  changes Cardiovascular: No chest pain, edema, orthopnea, palpitations, paroxysmal nocturnal dyspnea Dermatological: No rash, lesions or masses Respiratory: No cough Urologic: No hematuria, dysuria Abdominal: No nausea, vomiting, diarrhea, bright red blood per rectum, melena, or hematemesis Neurologic: No visual changes, weakness, changes in mental status All other systems reviewed and are otherwise negative except as noted above.  Physical Exam Blood pressure 108/72, pulse 80, height 5\' 8"  (1.727 m), weight 189 lb 9.6 oz (86.002 kg).  General: Well developed, well appearing 72 year old male in no acute distress. HEENT: Normocephalic, atraumatic. EOMs intact. Sclera nonicteric. Oropharynx clear.  Neck: Supple. No JVD. Lungs: Respirations regular and unlabored, CTA bilaterally. No wheezes, rales or rhonchi. Heart: Regular. S1, S2 present with crisp mechanical valve sounds noted. No murmur, rub, S3 or S4. Abdomen: Soft, non-distended.  Extremities: No clubbing, cyanosis or edema. DP/PT/Radials 2+ and equal bilaterally. Psych: Normal affect. Neuro: Alert and oriented X 3. Moves all extremities spontaneously. Skin: Left upper chest/implant site intact and well healed without erythema, edema, warmth, tenderness, drainage, bleeding or hematoma.   Diagnostics Device interrogation today - Thresholds and sensing consistent with previous device measurements. Lead impedance trends stable over time. 1 mode switch episode, ongoing. In AF 100% of time since implant. No ventricular arrhythmia episodes recorded. Patient bi-ventricularly pacing 94% of the time. Histograms reviewed and show adequate rate control. Device programmed with appropriate safety margins. Heart failure diagnostics reviewed and trends are stable for patient. Audible/vibratory alerts demonstrated for patient. No changes made this session. Estimated longevity 5.9 - 6.8 years.   Assessment and Plan 1. NICM, EF 25%, s/p recent CRT-D  implant Normal device function No episodes No programming changes made Wound check done - implant site well healed and Steri-strips removed without difficulty; reviewed activity restrictions Return for follow-up with Dr. Johney Frame in 3 months 2. Chronic systolic HF Stable; euvolemic by exam today Continue medical therapy with BB and ARB in addition to furosemide Continue daily weights and low sodium diet 3. Atrial fibrillation Rate controlled; histograms reviewed Continue metoprolol for rate control and warfarin for embolic prophylaxis  Signed, Harjot Dibello, PA-C 07/24/2012, 1:29 PM

## 2012-09-24 ENCOUNTER — Encounter: Payer: Self-pay | Admitting: Cardiology

## 2012-09-24 ENCOUNTER — Ambulatory Visit: Payer: Medicare Other | Admitting: Cardiology

## 2012-09-24 ENCOUNTER — Ambulatory Visit (INDEPENDENT_AMBULATORY_CARE_PROVIDER_SITE_OTHER): Payer: Medicare Other | Admitting: Cardiology

## 2012-09-24 VITALS — BP 129/79 | HR 80 | Ht 68.0 in | Wt 191.0 lb

## 2012-09-24 DIAGNOSIS — Z954 Presence of other heart-valve replacement: Secondary | ICD-10-CM

## 2012-09-24 DIAGNOSIS — I4891 Unspecified atrial fibrillation: Secondary | ICD-10-CM

## 2012-09-24 DIAGNOSIS — I428 Other cardiomyopathies: Secondary | ICD-10-CM

## 2012-09-24 DIAGNOSIS — I519 Heart disease, unspecified: Secondary | ICD-10-CM

## 2012-09-24 NOTE — Assessment & Plan Note (Signed)
Continues on Coumadin. 

## 2012-09-24 NOTE — Patient Instructions (Addendum)
Your physician recommends that you schedule a follow-up appointment in: 3 MONTHS IN THE [place] OFFICE WITH DR [facility]  Your physician recommends that you continue on your current medications as directed. Please refer to the Current Medication list given to you today.

## 2012-09-24 NOTE — Assessment & Plan Note (Signed)
Symptomatically stable. No changes made to current regimen. 

## 2012-09-24 NOTE — Progress Notes (Signed)
Clinical Summary Timothy Fritz is a 72 y.o.male last seen in April. He was referred to Dr. Johney Frame in the interim and underwent CRT-D implant in May. Cardioversion was attempted at the time of the procedure, however he reverted to atrial fibrillation soon thereafter. Amiodarone was discontinued with plan to continue strategy of heart rate control and anticoagulation.  He presents today doing well. States he continues to walk regularly, also fishing. Indicates no major problems with his medications, compliant with Coumadin. He will be seeing Dr. Johney Frame in the next few weeks.  Weight is up approximately 2 pounds since June.  No palpitations or device discharges.  No Known Allergies  Current Outpatient Prescriptions  Medication Sig Dispense Refill  . atorvastatin (LIPITOR) 20 MG tablet Take 1 tablet (20 mg total) by mouth every evening.  90 tablet  3  . furosemide (LASIX) 20 MG tablet Take 20 mg by mouth daily at 2 PM daily at 2 PM.      . losartan (COZAAR) 50 MG tablet Take 1 tablet (50 mg total) by mouth daily.  90 tablet  3  . metoprolol (LOPRESSOR) 50 MG tablet Take 2 tablets (100 mg) every morning and 1 tablet (50 mg) every evening.      . Multiple Vitamin (MULTIVITAMIN WITH MINERALS) TABS Take 1 tablet by mouth daily.      . Omega-3 Fatty Acids (FISH OIL) 1000 MG CAPS Take 1,000 mg by mouth daily.       . Tamsulosin HCl (FLOMAX) 0.4 MG CAPS Take 0.4 mg by mouth daily after supper.       . warfarin (COUMADIN) 5 MG tablet Take 2.5-5 mg by mouth every evening. 1 tablet (5 mg) on Sunday, Tuesday and Thursday, 1/2 tablet (2.5 mg) on all other days       No current facility-administered medications for this visit.    Past Medical History  Diagnosis Date  . Coronary atherosclerosis of native coronary artery     Nonobstructive at cardiac catherization 2002  . Mixed hyperlipidemia   . Essential hypertension, benign   . BPH (benign prostatic hypertrophy)   . Nonischemic cardiomyopathy    CRT-D  . Aortic valve, bicuspid     Mechanical AVR  . Aortic aneurysm, thoracic     Fusiform s/p Bentall procedure 2002  . Arthritis   . Basal cell carcinoma of face     "under right eye, years ago" (07/12/2012)  . Persistent atrial fibrillation   . Left bundle branch block     Social History Timothy Fritz reports that he quit smoking about 12 years ago. His smoking use included Cigarettes. He has a 41 pack-year smoking history. He has never used smokeless tobacco. Timothy Fritz reports that he drinks about 1.2 ounces of alcohol per week.  Review of Systems Negative except as outlined.  Physical Examination Filed Vitals:   09/24/12 1339  BP: 129/79  Pulse: 80   Filed Weights   09/24/12 1339  Weight: 191 lb (86.637 kg)   Appears comfortable at rest. HEENT: Conjunctiva and lids normal, oropharynx clear.  Neck: Supple, no carotid bruits, no thyromegaly.  Thorax: Well-healed device pocket site on the left. Lungs: Clear to auscultation, nonlabored.  Cardiac: Regular, crisp prosthetic mechanical aortic valve click, no diastolic murmur, no S3 gallop.  Abdomen: Soft, nontender, no hepatomegaly, no bruits.  Extremities: No significant pitting edema, few small varicosities. distal pulses are 2+.    Problem List and Plan   Atrial fibrillation Continue strategy of heart rate  control and anticoagulation, failed DCCV attempt at time of ICD placement in May.  Chronic systolic dysfunction of left ventricle Symptomatically stable. No changes made to current regimen.  Nonischemic cardiomyopathy Status post St. Jude CRT-D. Keep followup with Dr. Johney Frame as already scheduled. Will eventually consider a followup echocardiogram to reassess LVEF, last study was in February.  S/P aortic valve replacement with metallic valve Continues on Coumadin.    [name], M.D., F.A.C.C.

## 2012-09-24 NOTE — Assessment & Plan Note (Signed)
Continue strategy of heart rate control and anticoagulation, failed DCCV attempt at time of ICD placement in May.

## 2012-09-24 NOTE — Assessment & Plan Note (Signed)
Status post St. Jude CRT-D. Keep followup with Dr. Johney Frame as already scheduled. Will eventually consider a followup echocardiogram to reassess LVEF, last study was in February.

## 2012-10-17 ENCOUNTER — Encounter: Payer: Medicare Other | Admitting: Internal Medicine

## 2012-10-25 ENCOUNTER — Ambulatory Visit (INDEPENDENT_AMBULATORY_CARE_PROVIDER_SITE_OTHER): Payer: Medicare Other | Admitting: Internal Medicine

## 2012-10-25 ENCOUNTER — Encounter: Payer: Self-pay | Admitting: Internal Medicine

## 2012-10-25 VITALS — BP 123/79 | HR 81 | Ht 68.0 in | Wt 189.0 lb

## 2012-10-25 DIAGNOSIS — I447 Left bundle-branch block, unspecified: Secondary | ICD-10-CM

## 2012-10-25 DIAGNOSIS — Z954 Presence of other heart-valve replacement: Secondary | ICD-10-CM

## 2012-10-25 DIAGNOSIS — I10 Essential (primary) hypertension: Secondary | ICD-10-CM

## 2012-10-25 DIAGNOSIS — I519 Heart disease, unspecified: Secondary | ICD-10-CM

## 2012-10-25 DIAGNOSIS — I428 Other cardiomyopathies: Secondary | ICD-10-CM

## 2012-10-25 DIAGNOSIS — Z7901 Long term (current) use of anticoagulants: Secondary | ICD-10-CM

## 2012-10-25 DIAGNOSIS — I4891 Unspecified atrial fibrillation: Secondary | ICD-10-CM

## 2012-10-25 LAB — ICD DEVICE OBSERVATION
AL AMPLITUDE: 2.7 mv
AL IMPEDENCE ICD: 512.5 Ohm
ATRIAL PACING ICD: 0.38 pct
BAMS-0001: 150 {beats}/min
BAMS-0003: 80 {beats}/min
CHARGE TIME: 8.3 s
LV LEAD IMPEDENCE ICD: 712.5 Ohm
LV LEAD THRESHOLD: 1.375 V
MODE SWITCH EPISODES: 2
TOT-0006: 20140529000000
TOT-0007: 1
TZAT-0001FASTVT: 1
TZAT-0001SLOWVT: 1
TZAT-0004FASTVT: 8
TZAT-0012FASTVT: 200 ms
TZAT-0018FASTVT: NEGATIVE
TZON-0003FASTVT: 300 ms
TZON-0010FASTVT: 40 ms
TZON-0010SLOWVT: 40 ms
TZST-0001FASTVT: 3
TZST-0001SLOWVT: 2
TZST-0003FASTVT: 25 J
TZST-0003FASTVT: 40 J
VENTRICULAR PACING ICD: 86 pct
VF: 0

## 2012-10-25 MED ORDER — METOPROLOL SUCCINATE ER 100 MG PO TB24: ORAL_TABLET | ORAL | Status: DC

## 2012-10-25 NOTE — Patient Instructions (Addendum)
   Stop Metoprolol Tartrate (Lopressor)  Begin Torpol XL (Metorprolol Succinate) 100mg  every morning & 50mg  every evening - new sent to mail order pharm  30 day supply sent to local pharmacy on above for patient till his mail order arrives  Continue all other medications.   Follow up in  2 months

## 2012-10-25 NOTE — Progress Notes (Signed)
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  Since his BiV ICD implant, the patient reports doing very well.  He continues to have fatigue.  Today, he denies symptoms of palpitations, chest pain, shortness of breath,  lower extremity edema, dizziness, presyncope, syncope, or ICD shocks.  The patient is otherwise without complaint today.   Past Medical History  Diagnosis Date  . Coronary atherosclerosis of native coronary artery     Nonobstructive at cardiac catherization 2002  . Mixed hyperlipidemia   . Essential hypertension, benign   . BPH (benign prostatic hypertrophy)   . Nonischemic cardiomyopathy     CRT-D  . Aortic valve, bicuspid     Mechanical AVR  . Aortic aneurysm, thoracic     Fusiform s/p Bentall procedure 2002  . Arthritis   . Basal cell carcinoma of face     "under right eye, years ago" (07/12/2012)  . Persistent atrial fibrillation   . Left bundle branch block    Past Surgical History  Procedure Laterality Date  . Bentall procedure      Dr Laneta Simmers 2002  . Bi-ventricular implantable cardioverter defibrillator  (crt-d)  07/11/2012    [facility] Jude Medical Quadra Assura- Dr. Johney Frame  . Tonsillectomy and adenoidectomy  1949  . Cardiac valve replacement      Current Outpatient Prescriptions  Medication Sig Dispense Refill  . atorvastatin (LIPITOR) 20 MG tablet Take 1 tablet (20 mg total) by mouth every evening.  90 tablet  3  . furosemide (LASIX) 20 MG tablet Take 20 mg by mouth daily.      Marland Kitchen losartan (COZAAR) 50 MG tablet Take 1 tablet (50 mg total) by mouth daily.  90 tablet  3  . Multiple Vitamin (MULTIVITAMIN WITH MINERALS) TABS Take 1 tablet by mouth daily.      . Omega-3 Fatty Acids (FISH OIL) 1000 MG CAPS Take 1,000 mg by mouth daily.       . Tamsulosin HCl (FLOMAX) 0.4 MG CAPS Take 0.4 mg by mouth daily after supper.       . warfarin (COUMADIN) 5 MG tablet  Take 2.5-5 mg by mouth every evening. 1 tablet (5 mg) on Sunday, Tuesday and Thursday, 1/2 tablet (2.5 mg) on all other days      . metoprolol succinate (TOPROL-XL) 100 MG 24 hr tablet Take one tab by mouth (100mg ) every morning & 1/2 tab (50mg ) every evening  135 tablet  3   No current facility-administered medications for this visit.    Physical Exam: Filed Vitals:   10/25/12 0856  BP: 123/79  Pulse: 81  Height: 5\' 8"  (1.727 m)  Weight: 189 lb (85.73 kg)    GEN- The patient is well appearing, alert and oriented x 3 today.   Head- normocephalic, atraumatic Eyes-  Sclera clear, conjunctiva pink Ears- hearing intact Oropharynx- clear Lungs- Clear to ausculation bilaterally, normal work of breathing Chest- ICD pocket is well healed Heart- Regular rate and rhythm (paced), mechanical S2 GI- soft, NT, ND, + BS Extremities- no clubbing, cyanosis, or edema  ICD interrogation- reviewed in detail today,  See PACEART report ekg today reveals afib with BiV pacing  Assessment and Plan:  1.  Chronic systolic dysfunction euvolemic today, Coreview is not elevated Stable on an appropriate medical regimen Normal BiV ICD function See Pace Art report Will change mode to DDIR today.  He is only 86% BiV paced (see  below)  2. Persistent afib The patient has failed medical therapy with amiodarone. Unfortunately his afib is limiting our ability to biv pace the patient at this point. I will change rate control to toprol XL 100mg  qam and 50mg  qpm.  He will return in 2 months.  If his V rates remain elevated then I will increase toprol to 100mg  BID at that point.  If we are unable to control his afib with medicines then I think AV nodal ablation should be considered.  3. Mechanical AVR Continue coumadin long term  4. HTN Stable No change required today  Return to see me in 2 months

## 2012-11-01 ENCOUNTER — Encounter: Payer: Self-pay | Admitting: Internal Medicine

## 2012-11-30 ENCOUNTER — Telehealth: Payer: Self-pay | Admitting: Internal Medicine

## 2012-11-30 NOTE — Telephone Encounter (Signed)
Merlin transmission working properly, left message for patient on home and mobile #.

## 2012-11-30 NOTE — Telephone Encounter (Signed)
New Problem  Pt states he is uncertain if his monitor is working any more. Pt has questions about his wifi and his remote device.

## 2012-12-20 ENCOUNTER — Other Ambulatory Visit: Payer: Self-pay

## 2012-12-26 ENCOUNTER — Encounter: Payer: Medicare Other | Admitting: Internal Medicine

## 2012-12-27 ENCOUNTER — Ambulatory Visit: Payer: Medicare Other | Admitting: Cardiology

## 2013-01-09 ENCOUNTER — Encounter: Payer: Self-pay | Admitting: Cardiology

## 2013-01-09 ENCOUNTER — Ambulatory Visit (INDEPENDENT_AMBULATORY_CARE_PROVIDER_SITE_OTHER): Payer: Medicare Other | Admitting: Cardiology

## 2013-01-09 VITALS — BP 132/82 | HR 88 | Ht 68.0 in | Wt 195.8 lb

## 2013-01-09 DIAGNOSIS — I429 Cardiomyopathy, unspecified: Secondary | ICD-10-CM

## 2013-01-09 DIAGNOSIS — Z954 Presence of other heart-valve replacement: Secondary | ICD-10-CM

## 2013-01-09 DIAGNOSIS — I4891 Unspecified atrial fibrillation: Secondary | ICD-10-CM

## 2013-01-09 DIAGNOSIS — I519 Heart disease, unspecified: Secondary | ICD-10-CM

## 2013-01-09 DIAGNOSIS — I428 Other cardiomyopathies: Secondary | ICD-10-CM

## 2013-01-09 DIAGNOSIS — I10 Essential (primary) hypertension: Secondary | ICD-10-CM

## 2013-01-09 NOTE — Assessment & Plan Note (Signed)
No change in current regimen. 

## 2013-01-09 NOTE — Assessment & Plan Note (Signed)
Continue current regimen and exercise. Followup echocardiogram will be arranged prior to his next visit.

## 2013-01-09 NOTE — Progress Notes (Signed)
Clinical Summary Timothy Fritz is a 72 y.o.male last seen in August. Interval visit noted with Dr. Johney Frame in September. Some device adjustments were made at that time, no shocks noted, 86% BiV paced. Beta blocker dose was also increased in an effort to further slow atrial fibrillation.  He has been doing well, walking 2 miles most days of the week. Reports NYHA class 1-2 dyspnea, no palpitations or device shocks, no chest pain. States his heart rate seems more regular in general.  We did discuss consideration for followup echocardiogram for reassessment of LVEF.  No Known Allergies  Current Outpatient Prescriptions  Medication Sig Dispense Refill  . atorvastatin (LIPITOR) 20 MG tablet Take 1 tablet (20 mg total) by mouth every evening.  90 tablet  3  . furosemide (LASIX) 20 MG tablet Take 20 mg by mouth daily.      Marland Kitchen losartan (COZAAR) 50 MG tablet Take 1 tablet (50 mg total) by mouth daily.  90 tablet  3  . metoprolol succinate (TOPROL-XL) 100 MG 24 hr tablet Take one tab by mouth (100mg ) every morning & 1/2 tab (50mg ) every evening  45 tablet  0  . Multiple Vitamin (MULTIVITAMIN WITH MINERALS) TABS Take 1 tablet by mouth daily.      . Omega-3 Fatty Acids (FISH OIL) 1000 MG CAPS Take 1,000 mg by mouth daily.       . Tamsulosin HCl (FLOMAX) 0.4 MG CAPS Take 0.4 mg by mouth daily after supper.       . warfarin (COUMADIN) 5 MG tablet Take 2.5-5 mg by mouth every evening. 1 tablet (5 mg) on Sunday, Tuesday and Thursday, 1/2 tablet (2.5 mg) on all other days       No current facility-administered medications for this visit.    Past Medical History  Diagnosis Date  . Coronary atherosclerosis of native coronary artery     Nonobstructive at cardiac catherization 2002  . Mixed hyperlipidemia   . Essential hypertension, benign   . BPH (benign prostatic hypertrophy)   . Nonischemic cardiomyopathy     CRT-D  . Aortic valve, bicuspid     Mechanical AVR  . Aortic aneurysm, thoracic    Fusiform s/p Bentall procedure 2002  . Arthritis   . Basal cell carcinoma of face     "under right eye, years ago" (07/12/2012)  . Persistent atrial fibrillation   . Left bundle branch block     Past Surgical History  Procedure Laterality Date  . Bentall procedure      Dr Laneta Simmers 2002  . Bi-ventricular implantable cardioverter defibrillator  (crt-d)  07/11/2012    [facility]- Dr. Johney Frame  . Tonsillectomy and adenoidectomy  1949  . Cardiac valve replacement      Social History Mr. Wease reports that he quit smoking about 12 years ago. His smoking use included Cigarettes. He has a 41 pack-year smoking history. He has never used smokeless tobacco. Mr. Mcfarren reports that he drinks about 1.2 ounces of alcohol per week.  Review of Systems Negative except as outlined.  Physical Examination Filed Vitals:   01/09/13 1027  BP: 132/82  Pulse: 88   Filed Weights   01/09/13 1027  Weight: 195 lb 12.8 oz (88.814 kg)    Appears comfortable at rest.  HEENT: Conjunctiva and lids normal, oropharynx clear.  Neck: Supple, no carotid bruits, no thyromegaly.  Thorax: Well-healed device pocket site on the left.  Lungs: Clear to auscultation, nonlabored.  Cardiac: Regular, crisp prosthetic  mechanical aortic valve click, no diastolic murmur, no S3 gallop.  Abdomen: Soft, nontender, no hepatomegaly, no bruits.  Extremities: No significant pitting edema, few small varicosities. distal pulses are 2+.    Problem List and Plan   Nonischemic cardiomyopathy Continue current regimen and exercise. Followup echocardiogram will be arranged prior to his next visit.  Chronic systolic dysfunction of left ventricle Weight is up from the last visit, but he is symptomatically quite stable and exercising regularly. Status post BiV ICD per Dr. Johney Frame with medication adjustments noted. I expect he may want to increase beta blocker further at the upcoming visit depending on  interrogation.  Atrial fibrillation Continues on Coumadin.  S/P aortic valve replacement with metallic valve Status post mechanical AVR with Bentall procedure 2002.  Essential hypertension, benign No change in current regimen.    [name], M.D., F.A.C.C.

## 2013-01-09 NOTE — Patient Instructions (Signed)
Continue all current medications. Follow up in  3 months  Echo just prior to next office visit.

## 2013-01-09 NOTE — Assessment & Plan Note (Signed)
Continues on Coumadin. 

## 2013-01-09 NOTE — Assessment & Plan Note (Signed)
Weight is up from the last visit, but he is symptomatically quite stable and exercising regularly. Status post BiV ICD per Dr. Johney Frame with medication adjustments noted. I expect he may want to increase beta blocker further at the upcoming visit depending on interrogation.

## 2013-01-09 NOTE — Assessment & Plan Note (Signed)
Status post mechanical AVR with Bentall procedure 2002.  

## 2013-01-17 ENCOUNTER — Encounter: Payer: Self-pay | Admitting: Internal Medicine

## 2013-01-17 ENCOUNTER — Ambulatory Visit (INDEPENDENT_AMBULATORY_CARE_PROVIDER_SITE_OTHER): Payer: Medicare Other | Admitting: Internal Medicine

## 2013-01-17 VITALS — BP 122/75 | HR 92 | Ht 68.0 in | Wt 195.0 lb

## 2013-01-17 DIAGNOSIS — Z954 Presence of other heart-valve replacement: Secondary | ICD-10-CM

## 2013-01-17 DIAGNOSIS — I428 Other cardiomyopathies: Secondary | ICD-10-CM

## 2013-01-17 DIAGNOSIS — I2510 Atherosclerotic heart disease of native coronary artery without angina pectoris: Secondary | ICD-10-CM

## 2013-01-17 DIAGNOSIS — I10 Essential (primary) hypertension: Secondary | ICD-10-CM

## 2013-01-17 DIAGNOSIS — I4891 Unspecified atrial fibrillation: Secondary | ICD-10-CM

## 2013-01-17 DIAGNOSIS — I519 Heart disease, unspecified: Secondary | ICD-10-CM

## 2013-01-17 LAB — MDC_IDC_ENUM_SESS_TYPE_INCLINIC
Battery Remaining Longevity: 73.2 mo
Brady Statistic RA Percent Paced: 10 %
Brady Statistic RV Percent Paced: 88 %
Date Time Interrogation Session: 20141204101630
HighPow Impedance: 70.875
Lead Channel Impedance Value: 425 Ohm
Lead Channel Sensing Intrinsic Amplitude: 11.7 mV
Lead Channel Sensing Intrinsic Amplitude: 4.2 mV
Lead Channel Setting Pacing Amplitude: 2 V
Lead Channel Setting Pacing Pulse Width: 0.5 ms
Lead Channel Setting Sensing Sensitivity: 0.5 mV
Zone Setting Detection Interval: 250 ms
Zone Setting Detection Interval: 330 ms

## 2013-01-17 MED ORDER — METOPROLOL SUCCINATE ER 100 MG PO TB24: 100.0000 mg | ORAL_TABLET | Freq: Two times a day (BID) | ORAL | Status: DC

## 2013-01-17 NOTE — Patient Instructions (Signed)
   Increase Toprol XL (Metoprolol Succ) to 100mg  twice a day  - new sent to pharm  Continue all other medications.   Continue Merlin remote checks Your physician wants you to follow up in: 6 months.  You will receive a reminder letter in the mail one-two months in advance.  If you don't receive a letter, please call our office to schedule the follow up appointment

## 2013-01-17 NOTE — Progress Notes (Signed)
PCP: [name], MD Primary Cardiologist:  Dr Selena Lesser Sr. is a 72 y.o. male who presents today for routine electrophysiology followup.  Since his BiV ICD implant, the patient reports doing very well.  He feels that his quality of life is much improved.  He is tolerating changes to toprol without difficulty.  Today, he denies symptoms of palpitations, chest pain, shortness of breath,  lower extremity edema, dizziness, presyncope, syncope, or ICD shocks.  The patient is otherwise without complaint today.   Past Medical History  Diagnosis Date  . Coronary atherosclerosis of native coronary artery     Nonobstructive at cardiac catherization 2002  . Mixed hyperlipidemia   . Essential hypertension, benign   . BPH (benign prostatic hypertrophy)   . Nonischemic cardiomyopathy     CRT-D  . Aortic valve, bicuspid     Mechanical AVR  . Aortic aneurysm, thoracic     Fusiform s/p Bentall procedure 2002  . Arthritis   . Basal cell carcinoma of face     "under right eye, years ago" (07/12/2012)  . Persistent atrial fibrillation   . Left bundle branch block    Past Surgical History  Procedure Laterality Date  . Bentall procedure      Dr Laneta Simmers 2002  . Bi-ventricular implantable cardioverter defibrillator  (crt-d)  07/11/2012    [facility] [facility]- Dr. Johney Frame  . Tonsillectomy and adenoidectomy  1949  . Cardiac valve replacement      Current Outpatient Prescriptions  Medication Sig Dispense Refill  . atorvastatin (LIPITOR) 20 MG tablet Take 1 tablet (20 mg total) by mouth every evening.  90 tablet  3  . furosemide (LASIX) 20 MG tablet Take 20 mg by mouth daily.      Marland Kitchen losartan (COZAAR) 50 MG tablet Take 1 tablet (50 mg total) by mouth daily.  90 tablet  3  . metoprolol succinate (TOPROL-XL) 100 MG 24 hr tablet Take one tab by mouth (100mg ) every morning & 1/2 tab (50mg ) every evening  45 tablet  0  . Multiple Vitamin (MULTIVITAMIN WITH MINERALS) TABS Take 1 tablet  by mouth daily.      . Omega-3 Fatty Acids (FISH OIL) 1000 MG CAPS Take 1,000 mg by mouth daily.       . Tamsulosin HCl (FLOMAX) 0.4 MG CAPS Take 0.4 mg by mouth daily after supper.       . warfarin (COUMADIN) 5 MG tablet Take 2.5-5 mg by mouth every evening. 1 tablet (5 mg) on Sunday, Tuesday and Thursday, 1/2 tablet (2.5 mg) on all other days       No current facility-administered medications for this visit.    Physical Exam: Filed Vitals:   01/17/13 0931  BP: 122/75  Pulse: 92  Height: 5\' 8"  (1.727 m)  Weight: 195 lb (88.451 kg)    GEN- The patient is well appearing, alert and oriented x 3 today.   Head- normocephalic, atraumatic Eyes-  Sclera clear, conjunctiva pink Ears- hearing intact Oropharynx- clear Lungs- Clear to ausculation bilaterally, normal work of breathing Chest- ICD pocket is well healed Heart- Regular rate and rhythm (paced), mechanical S2 GI- soft, NT, ND, + BS Extremities- no clubbing, cyanosis, or edema  ICD interrogation- reviewed in detail today,  See PACEART report ekg today reveals afib with BiV pacing  Assessment and Plan:  1.  Chronic systolic dysfunction euvolemic today, Coreview is not elevated Stable on an appropriate medical regimen Normal BiV ICD function See Arita Miss Art  report Will change mode to DDIR today.  He is only 88% BiV paced (see below) I will enroll in our ICM clinic for volume management long term.  We spoke at length on this today and he is looking forward to this.  2. Persistent afib The patient has failed medical therapy with amiodarone. Unfortunately his afib is limiting our ability to biv pace the patient at this point. I will increase toprol to 100mg  BID.  If we are unable to control his afib with medicines then I think AV nodal ablation should be considered.  As he is clinically much improved, I would be reluctant to do this presently however.  3. Mechanical AVR Continue coumadin long term  4. HTN Stable No change  required today  Return to see me in 6 months Merlin

## 2013-01-17 NOTE — Addendum Note (Signed)
Addended by: Lesle Chris on: 01/17/2013 10:21 AM   Modules accepted: Orders

## 2013-01-21 HISTORY — PX: CATARACT EXTRACTION: SUR2

## 2013-02-11 ENCOUNTER — Encounter: Payer: Self-pay | Admitting: Internal Medicine

## 2013-02-20 ENCOUNTER — Telehealth: Payer: Self-pay | Admitting: Internal Medicine

## 2013-02-20 NOTE — Telephone Encounter (Signed)
New problem:  Pt states he is returning a phone call to the nurse. Pt states he will give more details when the nurse calls him back.

## 2013-02-21 ENCOUNTER — Ambulatory Visit (INDEPENDENT_AMBULATORY_CARE_PROVIDER_SITE_OTHER): Payer: Medicare Other | Admitting: *Deleted

## 2013-02-21 DIAGNOSIS — Z95810 Presence of automatic (implantable) cardiac defibrillator: Secondary | ICD-10-CM

## 2013-02-21 DIAGNOSIS — I519 Heart disease, unspecified: Secondary | ICD-10-CM

## 2013-02-21 NOTE — Telephone Encounter (Signed)
I spoke with the patient. 

## 2013-02-21 NOTE — Progress Notes (Signed)
EPIC Encounter for ICM Monitoring  Patient Name: Timothy Fritz. is a 73 y.o. male Date: 02/21/2013 Primary Care Physican: [name], MD Primary Cardiologist: Domenic Polite Electrophysiologist: Allred  Dry Weight: 190 lbs  Bi-V pacing: 81 %       In the past month, have you:  1. Gained more than 2 pounds in a day or more than 5 pounds in a week? no  2. Had changes in your medications (with verification of current medications)? no  3. Had more shortness of breath than is usual for you? no  4. Limited your activity because of shortness of breath? no  5. Not been able to sleep because of shortness of breath? no  6. Had increased swelling in your feet or ankles? no  7. Had symptoms of dehydration (dizziness, dry mouth, increased thirst, decreased urine output) no  8. Had changes in sodium restriction? no  9. Been compliant with medication? Yes   ICM trend:   Follow-up plan: ICM clinic phone appointment in 03/25/2013  ** Patient still with some a-fib. Bi-V pacing 81 %. Confirmed he is taking toprol at 100 mg BID. Will forward to Dr. Rayann Heman for further review of his a-fib.     Copy of note sent to patient's primary care physician, primary cardiologist, and device following physician.  Alvis Lemmings, RN, BSN 02/21/2013 9:31 AM

## 2013-02-24 NOTE — Progress Notes (Signed)
He has done reasonably well despite afib. I recently increased metoprolol however he continues to BiV pace < 90% I think we will continue to monitor symptoms.  If he develops worsening CHF then AV nodal ablation would warranted at that time.

## 2013-03-25 ENCOUNTER — Ambulatory Visit (INDEPENDENT_AMBULATORY_CARE_PROVIDER_SITE_OTHER): Payer: Medicare Other | Admitting: *Deleted

## 2013-03-25 DIAGNOSIS — Z95810 Presence of automatic (implantable) cardiac defibrillator: Secondary | ICD-10-CM

## 2013-03-25 DIAGNOSIS — I519 Heart disease, unspecified: Secondary | ICD-10-CM

## 2013-03-25 NOTE — Progress Notes (Addendum)
EPIC Encounter for ICM Monitoring  Patient Name: Timothy Fritz. is a 73 y.o. male Date: 03/25/2013 Primary Care Physican: [name], MD Primary Cardiologist: Domenic Polite Electrophysiologist: Allred Dry Weight: 190 lbs  Bi-V pacing: 80%       In the past month, have you:  1. Gained more than 2 pounds in a day or more than 5 pounds in a week? no  2. Had changes in your medications (with verification of current medications)? no  3. Had more shortness of breath than is usual for you? no  4. Limited your activity because of shortness of breath? no  5. Not been able to sleep because of shortness of breath? no  6. Had increased swelling in your feet or ankles? no  7. Had symptoms of dehydration (dizziness, dry mouth, increased thirst, decreased urine output) no  8. Had changes in sodium restriction? no  9. Been compliant with medication? Yes   ICM trend:   Follow-up plan: ICM clinic phone appointment :04/22/13 (full download)  ** Bi-V pacing still around 80%- will forward to Dr. Rayann Heman for review. However, the patient is asymptomatic with his a-fib. He is scheduled for a repeat echo on 04/18/13 and follow up with Dr. Domenic Polite on 04/23/13. **      Copy of note sent to patient's primary care physician, primary cardiologist, and device following physician.  Timothy Lemmings, RN, BSN 03/25/2013 11:19 AM  Will follow for now. Histogram reveals that V rates are mostly controlled.  He may benefit from increase in rate control when he sees Dr Domenic Polite in March.

## 2013-04-11 ENCOUNTER — Encounter: Payer: Self-pay | Admitting: Internal Medicine

## 2013-04-18 ENCOUNTER — Other Ambulatory Visit (INDEPENDENT_AMBULATORY_CARE_PROVIDER_SITE_OTHER): Payer: Medicare Other

## 2013-04-18 ENCOUNTER — Other Ambulatory Visit: Payer: Self-pay

## 2013-04-18 DIAGNOSIS — I359 Nonrheumatic aortic valve disorder, unspecified: Secondary | ICD-10-CM

## 2013-04-18 DIAGNOSIS — I429 Cardiomyopathy, unspecified: Secondary | ICD-10-CM

## 2013-04-19 ENCOUNTER — Encounter: Payer: Self-pay | Admitting: *Deleted

## 2013-04-22 ENCOUNTER — Ambulatory Visit (INDEPENDENT_AMBULATORY_CARE_PROVIDER_SITE_OTHER): Payer: Medicare Other | Admitting: *Deleted

## 2013-04-22 DIAGNOSIS — I519 Heart disease, unspecified: Secondary | ICD-10-CM

## 2013-04-22 DIAGNOSIS — I428 Other cardiomyopathies: Secondary | ICD-10-CM

## 2013-04-23 ENCOUNTER — Encounter: Payer: Self-pay | Admitting: Cardiology

## 2013-04-23 ENCOUNTER — Ambulatory Visit (INDEPENDENT_AMBULATORY_CARE_PROVIDER_SITE_OTHER): Payer: Medicare Other | Admitting: Cardiology

## 2013-04-23 VITALS — BP 124/80 | HR 90 | Ht 68.0 in | Wt 195.0 lb

## 2013-04-23 DIAGNOSIS — Z954 Presence of other heart-valve replacement: Secondary | ICD-10-CM

## 2013-04-23 DIAGNOSIS — I2510 Atherosclerotic heart disease of native coronary artery without angina pectoris: Secondary | ICD-10-CM

## 2013-04-23 DIAGNOSIS — I4891 Unspecified atrial fibrillation: Secondary | ICD-10-CM

## 2013-04-23 DIAGNOSIS — I428 Other cardiomyopathies: Secondary | ICD-10-CM

## 2013-04-23 DIAGNOSIS — I10 Essential (primary) hypertension: Secondary | ICD-10-CM

## 2013-04-23 MED ORDER — FUROSEMIDE 20 MG PO TABS: 20.0000 mg | ORAL_TABLET | Freq: Every day | ORAL | Status: DC

## 2013-04-23 MED ORDER — LOSARTAN POTASSIUM 50 MG PO TABS: 50.0000 mg | ORAL_TABLET | Freq: Every day | ORAL | Status: DC

## 2013-04-23 MED ORDER — METOPROLOL SUCCINATE ER 100 MG PO TB24
ORAL_TABLET | ORAL | Status: DC
Start: 2013-04-23 — End: 2013-06-17

## 2013-04-23 NOTE — Assessment & Plan Note (Signed)
Continues on Coumadin.

## 2013-04-23 NOTE — Patient Instructions (Signed)
Your physician recommends that you schedule a follow-up appointment in: 4 months. You will receive a reminder letter in the mail in about 1-2 months reminding you to call and schedule your appointment. If you don't receive this letter, please contact our office. Your physician has recommended you make the following change in your medication: Increase your toprol xl to 150 mg (1&1/2 tablets) in the morning and 100 mg in the evening (1 tablet). Your new prescription has been sent to your pharmacy. All other medications will remain the same.

## 2013-04-23 NOTE — Assessment & Plan Note (Signed)
Continue medical therapy, will increase Toprol-XL to 150 mg morning and 100 mg in the evening to hopefully try to obtain better heart rate control and allow increased biventricular pacing.

## 2013-04-23 NOTE — Progress Notes (Signed)
Clinical Summary Mr. Greenhalgh is a 73 y.o.male last seen in November 2014. Subsequent device interrogations noted. Biventricular pacing still around 80%, this has been reviewed by Dr. Rayann Heman - beta blocker dose increased previously. Patient has been relatively stable symptomatically.  Recent followup echocardiogram showed improvement in LVEF to the range of 40-45%, stable aortic prosthesis with reportedly moderate regurgitation (I reviewed the images and there is trace to mild aortic regurgitation), mild mitral regurgitation, severe left atrial enlargement, mildly dilated right ventricle, moderate to severe right atrial enlargement, mild to moderate tricuspid regurgitation.  He is exercising 3 days a week. States his weight fluctuates some, but he attributes a lot of this to his eating habits. No leg edema. No orthopnea or PND. Weight is stable by our scales compared to December 2014.   No Known Allergies  Current Outpatient Prescriptions  Medication Sig Dispense Refill  . atorvastatin (LIPITOR) 20 MG tablet Take 1 tablet (20 mg total) by mouth every evening.  90 tablet  3  . metoprolol succinate (TOPROL-XL) 100 MG 24 hr tablet Take 1 &1/2 tablets in the morning and 1 tablet in the evening  200 tablet  3  . Multiple Vitamin (MULTIVITAMIN WITH MINERALS) TABS Take 1 tablet by mouth daily.      . Omega-3 Fatty Acids (FISH OIL) 1000 MG CAPS Take 1,000 mg by mouth daily.       . Tamsulosin HCl (FLOMAX) 0.4 MG CAPS Take 0.4 mg by mouth daily after supper.       . warfarin (COUMADIN) 5 MG tablet Take 2.5-5 mg by mouth every evening. 1 tablet (5 mg) on Sunday, Tuesday and Thursday, 1/2 tablet (2.5 mg) on all other days      . furosemide (LASIX) 20 MG tablet Take 1 tablet (20 mg total) by mouth daily.  90 tablet  3  . losartan (COZAAR) 50 MG tablet Take 1 tablet (50 mg total) by mouth daily.  90 tablet  3   No current facility-administered medications for this visit.    Past Medical History    Diagnosis Date  . Coronary atherosclerosis of native coronary artery     Nonobstructive at cardiac catherization 2002  . Mixed hyperlipidemia   . Essential hypertension, benign   . BPH (benign prostatic hypertrophy)   . Nonischemic cardiomyopathy     CRT-D  . Aortic valve, bicuspid     Mechanical AVR  . Aortic aneurysm, thoracic     Fusiform s/p Bentall procedure 2002  . Arthritis   . Basal cell carcinoma of face   . Persistent atrial fibrillation   . Left bundle branch block     Past Surgical History  Procedure Laterality Date  . Bentall procedure      Dr Cyndia Bent 2002  . Bi-ventricular implantable cardioverter defibrillator  (crt-d)  07/11/2012    [facility]- Dr. Rayann Heman  . Tonsillectomy and adenoidectomy  1949  . Cardiac valve replacement    . Cataract extraction Left 01/21/13    Social History Mr. Davis reports that he quit smoking about 13 years ago. His smoking use included Cigarettes. He has a 41 pack-year smoking history. He has never used smokeless tobacco. Mr. Welte reports that he drinks about 1.2 ounces of alcohol per week.  Review of Systems Negative except as outlined.  Physical Examination Filed Vitals:   04/23/13 0958  BP: 124/80  Pulse: 90   Filed Weights   04/23/13 0958  Weight: 195 lb (88.451 kg)  Appears comfortable at rest.  HEENT: Conjunctiva and lids normal, oropharynx clear.  Neck: Supple, no carotid bruits, no thyromegaly.  Thorax: Well-healed device pocket site on the left.  Lungs: Clear to auscultation, nonlabored.  Cardiac: Regular, crisp prosthetic mechanical aortic valve click, no diastolic murmur, no S3 gallop.  Abdomen: Soft, nontender, no hepatomegaly, no bruits.  Extremities: No significant pitting edema, few small varicosities. distal pulses are 2+.    Problem List and Plan   Nonischemic cardiomyopathy Continue medical therapy, will increase Toprol-XL to 150 mg morning and 100 mg in the evening to  hopefully try to obtain better heart rate control and allow increased biventricular pacing.  Atrial fibrillation Continues on Coumadin.  CORONARY ATHEROSCLEROSIS NATIVE CORONARY ARTERY Minimal coronary luminal irregularities by cardiac catheterization March 2014.  Essential hypertension, benign Blood pressure well controlled.  S/P aortic valve replacement with metallic valve Status post mechanical AVR with Bentall procedure 2002.     Satira Sark, M.D., F.A.C.C.

## 2013-04-23 NOTE — Assessment & Plan Note (Signed)
Status post mechanical AVR with Bentall procedure 2002.

## 2013-04-23 NOTE — Assessment & Plan Note (Signed)
Minimal coronary luminal irregularities by cardiac catheterization March 2014.

## 2013-04-23 NOTE — Assessment & Plan Note (Signed)
Blood pressure well controlled

## 2013-04-25 ENCOUNTER — Encounter: Payer: Self-pay | Admitting: *Deleted

## 2013-04-29 ENCOUNTER — Encounter: Payer: Self-pay | Admitting: Internal Medicine

## 2013-04-29 LAB — MDC_IDC_ENUM_SESS_TYPE_REMOTE
HighPow Impedance: 70 Ohm
Lead Channel Impedance Value: 440 Ohm
Lead Channel Impedance Value: 700 Ohm
Lead Channel Pacing Threshold Amplitude: 0.875 V
Lead Channel Sensing Intrinsic Amplitude: 11.7 mV
Lead Channel Setting Pacing Amplitude: 2 V
Lead Channel Setting Pacing Amplitude: 2.375
Lead Channel Setting Pacing Pulse Width: 0.5 ms
MDC IDC MSMT LEADCHNL LV PACING THRESHOLD AMPLITUDE: 0.875 V
MDC IDC MSMT LEADCHNL LV PACING THRESHOLD PULSEWIDTH: 0.5 ms
MDC IDC MSMT LEADCHNL RA IMPEDANCE VALUE: 440 Ohm
MDC IDC MSMT LEADCHNL RV PACING THRESHOLD PULSEWIDTH: 0.5 ms
MDC IDC PG SERIAL: 7097390
MDC IDC SET LEADCHNL RA PACING AMPLITUDE: 3.5 V
MDC IDC SET LEADCHNL RV PACING PULSEWIDTH: 0.5 ms
MDC IDC SET LEADCHNL RV SENSING SENSITIVITY: 0.5 mV
MDC IDC SET ZONE DETECTION INTERVAL: 300 ms
MDC IDC SET ZONE DETECTION INTERVAL: 330 ms
MDC IDC STAT BRADY RA PERCENT PACED: 9.2 %
Zone Setting Detection Interval: 250 ms

## 2013-05-15 ENCOUNTER — Encounter: Payer: Self-pay | Admitting: *Deleted

## 2013-05-23 ENCOUNTER — Encounter: Payer: Self-pay | Admitting: *Deleted

## 2013-05-23 ENCOUNTER — Ambulatory Visit (INDEPENDENT_AMBULATORY_CARE_PROVIDER_SITE_OTHER): Payer: Medicare Other | Admitting: *Deleted

## 2013-05-23 DIAGNOSIS — Z95810 Presence of automatic (implantable) cardiac defibrillator: Secondary | ICD-10-CM

## 2013-05-23 DIAGNOSIS — I519 Heart disease, unspecified: Secondary | ICD-10-CM

## 2013-05-23 NOTE — Progress Notes (Signed)
Noted. Please forward to Dr. Rayann Heman to see if he would like to further advance beta blocker or consider other changes in light of bi-V pacing at 79% with AF.

## 2013-05-23 NOTE — Progress Notes (Signed)
EPIC Encounter for ICM Monitoring  Patient Name: Timothy Fritz. is a 73 y.o. male Date: 05/23/2013 Primary Care Physican: [name], MD Primary Cardiologist: Domenic Polite Electrophysiologist: Allred Dry Weight: 194 lbs  Bi-V pacing: 79%       In the past month, have you:  1. Gained more than 2 pounds in a day or more than 5 pounds in a week? No. The patient has continued to notice a slow increase in his overall weight, which he thinks may be due to his diet. We have discussed a low sodium diet.   2. Had changes in your medications (with verification of current medications)? Yes. On 3/10, the patient saw Dr. Domenic Polite and his Toprol XL was increased to 150 mg in the am and 100 mg in the pm. He is tolerating this change.  3. Had more shortness of breath than is usual for you? no  4. Limited your activity because of shortness of breath? no  5. Not been able to sleep because of shortness of breath? no  6. Had increased swelling in your feet or ankles? no  7. Had symptoms of dehydration (dizziness, dry mouth, increased thirst, decreased urine output) no  8. Had changes in sodium restriction? no  9. Been compliant with medication? Yes   ICM trend:   ** Bi-V pacing is at 79 %. The patient is still having a-fib. Will forward trends to Dr. Allred/ Dr. Domenic Polite for since Metoprolol dose was increased on 3/10. **   Follow-up plan: ICM clinic phone appointment: 07/18/13. The patient is due to see Dr. Rayann Heman on 06/17/13 in the [place] office.  Copy of note sent to patient's primary care physician, primary cardiologist, and device following physician.  Emily Filbert, RN, BSN 05/23/2013 10:37 AM

## 2013-05-23 NOTE — Patient Instructions (Signed)
Your next transmission will be due on Thursday 07/18/13 to check the fluid levels on your device.     2 Gram Low Sodium Diet A 2 gram sodium diet restricts the amount of sodium in the diet to no more than 2 g or 2000 mg daily. Limiting the amount of sodium is often used to help lower blood pressure. It is important if you have heart, liver, or kidney problems. Many foods contain sodium for flavor and sometimes as a preservative. When the amount of sodium in a diet needs to be low, it is important to know what to look for when choosing foods and drinks. The following includes some information and guidelines to help make it easier for you to adapt to a low sodium diet. QUICK TIPS  Do not add salt to food.  Avoid convenience items and fast food.  Choose unsalted snack foods.  Buy lower sodium products, often labeled as "lower sodium" or "no salt added."  Check food labels to learn how much sodium is in 1 serving.  When eating at a restaurant, ask that your food be prepared with less salt or none, if possible. READING FOOD LABELS FOR SODIUM INFORMATION The nutrition facts label is a good place to find how much sodium is in foods. Look for products with no more than 500 to 600 mg of sodium per meal and no more than 150 mg per serving. Remember that 2 g = 2000 mg. The food label may also list foods as:  Sodium-free: Less than 5 mg in a serving.  Very low sodium: 35 mg or less in a serving.  Low-sodium: 140 mg or less in a serving.  Light in sodium: 50% less sodium in a serving. For example, if a food that usually has 300 mg of sodium is changed to become light in sodium, it will have 150 mg of sodium.  Reduced sodium: 25% less sodium in a serving. For example, if a food that usually has 400 mg of sodium is changed to reduced sodium, it will have 300 mg of sodium. CHOOSING FOODS Grains  Avoid: Salted crackers and snack items. Some cereals, including instant hot cereals. Bread stuffing and  biscuit mixes. Seasoned rice or pasta mixes.  Choose: Unsalted snack items. Low-sodium cereals, oats, puffed wheat and rice, shredded wheat. English muffins and bread. Pasta. Meats  Avoid: Salted, canned, smoked, spiced, pickled meats, including fish and poultry. Bacon, ham, sausage, cold cuts, hot dogs, anchovies.  Choose: Low-sodium canned tuna and salmon. Fresh or frozen meat, poultry, and fish. Dairy  Avoid: Processed cheese and spreads. Cottage cheese. Buttermilk and condensed milk. Regular cheese.  Choose: Milk. Low-sodium cottage cheese. Yogurt. Sour cream. Low-sodium cheese. Fruits and Vegetables  Avoid: Regular canned vegetables. Regular canned tomato sauce and paste. Frozen vegetables in sauces. Olives. Angie Fava. Relishes. Sauerkraut.  Choose: Low-sodium canned vegetables. Low-sodium tomato sauce and paste. Frozen or fresh vegetables. Fresh and frozen fruit. Condiments  Avoid: Canned and packaged gravies. Worcestershire sauce. Tartar sauce. Barbecue sauce. Soy sauce. Steak sauce. Ketchup. Onion, garlic, and table salt. Meat flavorings and tenderizers.  Choose: Fresh and dried herbs and spices. Low-sodium varieties of mustard and ketchup. Lemon juice. Tabasco sauce. Horseradish. SAMPLE 2 GRAM SODIUM MEAL PLAN Breakfast / Sodium (mg)  1 cup low-fat milk / 409 mg  2 slices whole-wheat toast / 270 mg  1 tbs heart-healthy margarine / 153 mg  1 hard-boiled egg / 139 mg  1 small orange / 0 mg Lunch / Sodium (  mg)  1 cup raw carrots / 76 mg   cup hummus / 298 mg  1 cup low-fat milk / 143 mg   cup red grapes / 2 mg  1 whole-wheat pita bread / 356 mg Dinner / Sodium (mg)  1 cup whole-wheat pasta / 2 mg  1 cup low-sodium tomato sauce / 73 mg  3 oz lean ground beef / 57 mg  1 small side salad (1 cup raw spinach leaves,  cup cucumber,  cup yellow bell pepper) with 1 tsp olive oil and 1 tsp red wine vinegar / 25 mg Snack / Sodium (mg)  1 container low-fat  vanilla yogurt / 107 mg  3 graham cracker squares / 127 mg Nutrient Analysis  Calories: 2033  Protein: 77 g  Carbohydrate: 282 g  Fat: 72 g  Sodium: 1971 mg Document Released: 01/31/2005 Document Revised: 04/25/2011 Document Reviewed: 05/04/2009 Exit[employer] Patient Information 2014 [place].

## 2013-06-17 ENCOUNTER — Encounter: Payer: Self-pay | Admitting: Internal Medicine

## 2013-06-17 ENCOUNTER — Ambulatory Visit (INDEPENDENT_AMBULATORY_CARE_PROVIDER_SITE_OTHER): Payer: Medicare Other | Admitting: Internal Medicine

## 2013-06-17 VITALS — BP 108/74 | HR 82 | Ht 68.0 in | Wt 196.8 lb

## 2013-06-17 DIAGNOSIS — I428 Other cardiomyopathies: Secondary | ICD-10-CM

## 2013-06-17 DIAGNOSIS — Z7901 Long term (current) use of anticoagulants: Secondary | ICD-10-CM

## 2013-06-17 DIAGNOSIS — I519 Heart disease, unspecified: Secondary | ICD-10-CM

## 2013-06-17 DIAGNOSIS — I10 Essential (primary) hypertension: Secondary | ICD-10-CM

## 2013-06-17 DIAGNOSIS — Z954 Presence of other heart-valve replacement: Secondary | ICD-10-CM

## 2013-06-17 DIAGNOSIS — I4891 Unspecified atrial fibrillation: Secondary | ICD-10-CM

## 2013-06-17 LAB — MDC_IDC_ENUM_SESS_TYPE_INCLINIC
Brady Statistic RA Percent Paced: 9.3 %
Date Time Interrogation Session: 20150504103849
HighPow Impedance: 75.375
Lead Channel Impedance Value: 425 Ohm
Lead Channel Impedance Value: 737.5 Ohm
Lead Channel Pacing Threshold Amplitude: 0.875 V
Lead Channel Pacing Threshold Amplitude: 0.875 V
Lead Channel Sensing Intrinsic Amplitude: 11.7 mV
Lead Channel Setting Pacing Amplitude: 2 V
Lead Channel Setting Pacing Amplitude: 3.5 V
Lead Channel Setting Sensing Sensitivity: 0.5 mV
MDC IDC MSMT BATTERY REMAINING LONGEVITY: 69.6 mo
MDC IDC MSMT LEADCHNL LV PACING THRESHOLD PULSEWIDTH: 0.5 ms
MDC IDC MSMT LEADCHNL RA SENSING INTR AMPL: 2.6 mV
MDC IDC MSMT LEADCHNL RV IMPEDANCE VALUE: 450 Ohm
MDC IDC MSMT LEADCHNL RV PACING THRESHOLD PULSEWIDTH: 0.5 ms
MDC IDC PG SERIAL: 7097390
MDC IDC SET LEADCHNL LV PACING PULSEWIDTH: 0.5 ms
MDC IDC SET LEADCHNL RV PACING AMPLITUDE: 2 V
MDC IDC SET LEADCHNL RV PACING PULSEWIDTH: 0.5 ms
MDC IDC STAT BRADY RV PERCENT PACED: 79 %
Zone Setting Detection Interval: 250 ms
Zone Setting Detection Interval: 300 ms
Zone Setting Detection Interval: 330 ms

## 2013-06-17 MED ORDER — METOPROLOL SUCCINATE ER 100 MG PO TB24: ORAL_TABLET | ORAL | Status: DC

## 2013-06-17 NOTE — Patient Instructions (Signed)
   Merlin check September 18, 2013. Your physician recommends that you schedule a follow-up appointment in: 1 year with Dr. Rayann Heman. You will receive a reminder letter in the mail in about 10 months reminding you to call and schedule your appointment. If you don't receive this letter, please contact our office. Your physician has recommended you make the following change in your medication:  Increase your toprol xl to 150 mg twice daily. Your new prescription was sent to your pharmacy. Continue all other medications the same.

## 2013-06-17 NOTE — Progress Notes (Signed)
PCP: [name], MD Primary Cardiologist:  Dr Leitha Bleak Sr. is a 73 y.o. male who presents today for routine electrophysiology followup.  Since his last visit, the patient reports doing very well.  He feels that his quality of life is much improved.  He does give out easily when doing yard work.  Today, he denies symptoms of palpitations, chest pain, shortness of breath,  lower extremity edema, dizziness, presyncope, syncope, or ICD shocks.  The patient is otherwise without complaint today.   Past Medical History  Diagnosis Date  . Coronary atherosclerosis of native coronary artery     Nonobstructive at cardiac catherization 2002  . Mixed hyperlipidemia   . Essential hypertension, benign   . BPH (benign prostatic hypertrophy)   . Nonischemic cardiomyopathy     CRT-D  . Aortic valve, bicuspid     Mechanical AVR  . Aortic aneurysm, thoracic     Fusiform s/p Bentall procedure 2002  . Arthritis   . Basal cell carcinoma of face   . Persistent atrial fibrillation   . Left bundle branch block    Past Surgical History  Procedure Laterality Date  . Bentall procedure      Dr Cyndia Bent 2002  . Bi-ventricular implantable cardioverter defibrillator  (crt-d)  07/11/2012    [facility] Jude Medical Quadra Assura- Dr. Rayann Heman  . Tonsillectomy and adenoidectomy  1949  . Cardiac valve replacement    . Cataract extraction Left 01/21/13    Current Outpatient Prescriptions  Medication Sig Dispense Refill  . atorvastatin (LIPITOR) 20 MG tablet Take 1 tablet (20 mg total) by mouth every evening.  90 tablet  3  . furosemide (LASIX) 20 MG tablet Take 1 tablet (20 mg total) by mouth daily.  90 tablet  3  . losartan (COZAAR) 50 MG tablet Take 1 tablet (50 mg total) by mouth daily.  90 tablet  3  . metoprolol succinate (TOPROL-XL) 100 MG 24 hr tablet Take 1 &1/2 tablets in the morning and 1 tablet in the evening  200 tablet  3  . Multiple Vitamin (MULTIVITAMIN WITH MINERALS) TABS Take 1 tablet by  mouth daily.      . Omega-3 Fatty Acids (FISH OIL) 1000 MG CAPS Take 1,000 mg by mouth daily.       . Tamsulosin HCl (FLOMAX) 0.4 MG CAPS Take 0.4 mg by mouth daily after supper.       . warfarin (COUMADIN) 5 MG tablet Take 2.5-5 mg by mouth every evening. 1 tablet (5 mg) on Sunday, Tuesday and Thursday, 1/2 tablet (2.5 mg) on all other days       No current facility-administered medications for this visit.    Physical Exam: Filed Vitals:   06/17/13 0912  BP: 108/74  Pulse: 82  Height: 5\' 8"  (1.727 m)  Weight: 196 lb 12.8 oz (89.268 kg)    GEN- The patient is well appearing, alert and oriented x 3 today.   Head- normocephalic, atraumatic Eyes-  Sclera clear, conjunctiva pink Ears- hearing intact Oropharynx- clear Lungs- Clear to ausculation bilaterally, normal work of breathing Chest- ICD pocket is well healed Heart- Regular rate and rhythm (paced), mechanical S2 GI- soft, NT, ND, + BS Extremities- no clubbing, cyanosis, or edema  ICD interrogation- reviewed in detail today,  See PACEART report ekg today reveals afib with BiV pacing  Assessment and Plan:  1.  Chronic systolic dysfunction euvolemic today, [place] is followed by Alvis Lemmings Stable on an appropriate medical regimen Normal BiV ICD  function See [name] Art report Will change mode to VVIR today He is only 79% BiV paced.  I have strongly advised AV nodal ablation.  He is contemplating this option but is not ready to proceed. I will therefore increase Toprol to 150mg  BID today  2. Persistent afib The patient has failed medical therapy with amiodarone. Continue rate control as above.  I have encouraged AV nodal ablation which he will ponder. Anticoagulated with coumadin for valvular afib  3. Mechanical AVR Continue coumadin long term  4. HTN Stable No change required today  Merlin Follow-up with Dr Domenic Polite as scheduled

## 2013-07-18 ENCOUNTER — Encounter: Payer: Self-pay | Admitting: *Deleted

## 2013-07-18 ENCOUNTER — Ambulatory Visit (INDEPENDENT_AMBULATORY_CARE_PROVIDER_SITE_OTHER): Payer: Medicare Other | Admitting: *Deleted

## 2013-07-18 DIAGNOSIS — Z95810 Presence of automatic (implantable) cardiac defibrillator: Secondary | ICD-10-CM

## 2013-07-18 DIAGNOSIS — I519 Heart disease, unspecified: Secondary | ICD-10-CM

## 2013-07-18 NOTE — Progress Notes (Signed)
EPIC Encounter for ICM Monitoring  Patient Name: Timothy Fritz. is a 73 y.o. male Date: 07/18/2013 Primary Care Physican: [name], MD Primary Cardiologist: Domenic Polite Electrophysiologist: Allred Dry Weight: 194 lbs   Bi-V pacing: 84%      In the past month, have you:  1. Gained more than 2 pounds in a day or more than 5 pounds in a week? Yes. The patient did notice that his weight was up to 197 lbs at one time in the last week. He did not require extra lasix and states he is back to his baseline weight. This does correlate with elevated Corvue reading.  2. Had changes in your medications (with verification of current medications)? Yes. Metoprolol was increased to 150 mg BID on 06/17/13 at his office visit with Dr. Rayann Heman. He is tolerating this without difficulty and states he is asymptomatic with his a-fib.  3. Had more shortness of breath than is usual for you? no  4. Limited your activity because of shortness of breath? no  5. Not been able to sleep because of shortness of breath? no  6. Had increased swelling in your feet or ankles? no  7. Had symptoms of dehydration (dizziness, dry mouth, increased thirst, decreased urine output) no  8. Had changes in sodium restriction? no  9. Been compliant with medication? Yes   ICM trend:   ** The patient is now Bi-V pacing at 84% (up from 79%). HR's appear to be somewhat more controlled on the increased dose of metoprolol. **     Follow-up plan: ICM clinic phone appointment: 08/19/13- follow up is scheduled with Dr. Domenic Polite on 08/26/13.  Copy of note sent to patient's primary care physician, primary cardiologist, and device following physician.  Emily Filbert, RN, BSN 07/18/2013 11:02 AM

## 2013-08-18 ENCOUNTER — Encounter: Payer: Self-pay | Admitting: Internal Medicine

## 2013-08-21 ENCOUNTER — Encounter: Payer: Self-pay | Admitting: Internal Medicine

## 2013-08-26 ENCOUNTER — Encounter: Payer: Self-pay | Admitting: Cardiology

## 2013-08-26 ENCOUNTER — Ambulatory Visit (INDEPENDENT_AMBULATORY_CARE_PROVIDER_SITE_OTHER): Payer: Medicare Other | Admitting: Cardiology

## 2013-08-26 ENCOUNTER — Encounter: Payer: Self-pay | Admitting: *Deleted

## 2013-08-26 VITALS — BP 117/77 | HR 82 | Ht 68.0 in | Wt 197.8 lb

## 2013-08-26 DIAGNOSIS — I428 Other cardiomyopathies: Secondary | ICD-10-CM

## 2013-08-26 DIAGNOSIS — Z954 Presence of other heart-valve replacement: Secondary | ICD-10-CM

## 2013-08-26 DIAGNOSIS — I10 Essential (primary) hypertension: Secondary | ICD-10-CM

## 2013-08-26 DIAGNOSIS — I447 Left bundle-branch block, unspecified: Secondary | ICD-10-CM

## 2013-08-26 NOTE — Assessment & Plan Note (Signed)
Status post biventricular ICD, followed by Dr. Rayann Heman.

## 2013-08-26 NOTE — Assessment & Plan Note (Signed)
Continue medical therapy, he has been clinically stable as outlined. LVEF 40-45% by last assessment.

## 2013-08-26 NOTE — Progress Notes (Signed)
Clinical Summary Timothy Fritz is a 73 y.o.male last seen in March. Interval visit with Timothy Fritz noted. At that time he was BIV pacing only 79%, AV nodal ablation was discussed, and Toprol-XL was increased to 150 mg twice daily. Since then BIV pacing increased to 84%. He is still not sure about whether he will pursue AV nodal ablation, sees Timothy Fritz in August.  We reviewed his medications. Reports no problems.  Recent followup echocardiogram showed improvement in LVEF to the range of 40-45%, stable aortic prosthesis with reportedly moderate regurgitation (I reviewed the images and there is trace to mild aortic regurgitation), mild mitral regurgitation, severe left atrial enlargement, mildly dilated right ventricle, moderate to severe right atrial enlargement, mild to moderate tricuspid regurgitation.   No Known Allergies  Current Outpatient Prescriptions  Medication Sig Dispense Refill  . atorvastatin (LIPITOR) 20 MG tablet Take 1 tablet (20 mg total) by mouth every evening.  90 tablet  3  . furosemide (LASIX) 20 MG tablet Take 1 tablet (20 mg total) by mouth daily.  90 tablet  3  . losartan (COZAAR) 50 MG tablet Take 1 tablet (50 mg total) by mouth daily.  90 tablet  3  . meclizine (ANTIVERT) 25 MG tablet Take 25 mg by mouth 3 (three) times daily as needed for dizziness.      . metoprolol succinate (TOPROL-XL) 100 MG 24 hr tablet Take 1&1/2 tablets by mouth twice daily  270 tablet  3  . Multiple Vitamin (MULTIVITAMIN WITH MINERALS) TABS Take 1 tablet by mouth daily.      . Omega-3 Fatty Acids (FISH OIL) 1000 MG CAPS Take 1,000 mg by mouth daily.       . Tamsulosin HCl (FLOMAX) 0.4 MG CAPS Take 0.4 mg by mouth daily after supper.       . warfarin (COUMADIN) 5 MG tablet Take 2.5-5 mg by mouth every evening. 1 tablet (5 mg) on Sunday, Tuesday and Thursday, 1/2 tablet (2.5 mg) on all other days MANAGED BY PMD       No current facility-administered medications for this visit.    Past  Medical History  Diagnosis Date  . Coronary atherosclerosis of native coronary artery     Nonobstructive at cardiac catherization 2002  . Mixed hyperlipidemia   . Essential hypertension, benign   . BPH (benign prostatic hypertrophy)   . Nonischemic cardiomyopathy     CRT-D  . Aortic valve, bicuspid     Mechanical AVR  . Aortic aneurysm, thoracic     Fusiform s/p Bentall procedure 2002  . Arthritis   . Basal cell carcinoma of face   . Persistent atrial fibrillation   . Left bundle branch block     Past Surgical History  Procedure Laterality Date  . Bentall procedure      Dr Timothy Fritz 2002  . Bi-ventricular implantable cardioverter defibrillator  (crt-d)  07/11/2012    [facility] Jude Medical Quadra Assura- Timothy Fritz  . Tonsillectomy and adenoidectomy  1949  . Cardiac valve replacement    . Cataract extraction Left 01/21/13    Social History Timothy Fritz reports that he quit smoking about 13 years ago. His smoking use included Cigarettes. He has a 41 pack-year smoking history. He has never used smokeless tobacco. Timothy Fritz reports that he drinks about 1.2 ounces of alcohol per week.  Review of Systems No palpitations or device discharges. No syncope. Has had recent vertigo, taking meclizine. Other systems reviewed and negative.  Physical Examination Filed  Vitals:   08/26/13 1142  BP: 117/77  Pulse: 82   Filed Weights   08/26/13 1142  Weight: 197 lb 12.8 oz (89.721 kg)    Appears comfortable at rest.  HEENT: Conjunctiva and lids normal, oropharynx clear.  Neck: Supple, no carotid bruits, no thyromegaly.  Thorax: Well-healed device pocket site on the left.  Lungs: Clear to auscultation, nonlabored.  Cardiac: Regular, crisp prosthetic mechanical aortic valve click, no diastolic murmur, no S3 gallop.  Abdomen: Soft, nontender, no hepatomegaly, no bruits.  Extremities: No significant pitting edema, few small varicosities. distal pulses are 2+.    Problem List and Plan    Nonischemic cardiomyopathy Continue medical therapy, he has been clinically stable as outlined. LVEF 40-45% by last assessment.  S/P aortic valve replacement with metallic valve Continues on Coumadin.  Essential hypertension, benign Blood pressure is normal today.  Left bundle branch block Status post biventricular ICD, followed by Timothy Fritz.    Timothy Fritz, M.D., F.A.C.C.

## 2013-08-26 NOTE — Patient Instructions (Signed)

## 2013-08-26 NOTE — Assessment & Plan Note (Signed)
Blood pressure is normal today. 

## 2013-08-26 NOTE — Assessment & Plan Note (Signed)
Continues on Coumadin.

## 2013-09-18 ENCOUNTER — Ambulatory Visit (INDEPENDENT_AMBULATORY_CARE_PROVIDER_SITE_OTHER): Payer: Medicare Other | Admitting: *Deleted

## 2013-09-18 DIAGNOSIS — I5022 Chronic systolic (congestive) heart failure: Secondary | ICD-10-CM

## 2013-09-18 DIAGNOSIS — I428 Other cardiomyopathies: Secondary | ICD-10-CM

## 2013-09-18 NOTE — Progress Notes (Signed)
Remote ICD transmission.   

## 2013-09-19 ENCOUNTER — Encounter: Payer: Self-pay | Admitting: *Deleted

## 2013-09-19 NOTE — Addendum Note (Signed)
Addended by: Alvis Lemmings C on: 09/19/2013 02:28 PM   Modules accepted: Level of Service

## 2013-09-19 NOTE — Progress Notes (Signed)
EPIC Encounter for ICM Monitoring  Patient Name: Timothy Fritz. is a 73 y.o. male Date: 09/19/2013 Primary Care Physican: [name], MD Primary Cardiologist: Domenic Polite Electrophysiologist: Allred Dry Weight: 192 lbs  Bi-V pacing: 84%        In the past month, have you:  1. Gained more than 2 pounds in a day or more than 5 pounds in a week? no  2. Had changes in your medications (with verification of current medications)? no  3. Had more shortness of breath than is usual for you? no  4. Limited your activity because of shortness of breath? no  5. Not been able to sleep because of shortness of breath? no  6. Had increased swelling in your feet or ankles? no  7. Had symptoms of dehydration (dizziness, dry mouth, increased thirst, decreased urine output) no  8. Had changes in sodium restriction? no  9. Been compliant with medication? Yes   ** Per the patient, his HR's at home are mostly 88-96 bpm. Bi-V pacing percentage has not changed. The patient is still considering AV node ablation, but he has not decided on this yet. **  ICM trend:   Follow-up plan: ICM clinic phone appointment: 10/24/13  Copy of note sent to patient's primary care physician, primary cardiologist, and device following physician.  Alvis Lemmings, RN, BSN 09/19/2013 2:26 PM

## 2013-09-23 LAB — MDC_IDC_ENUM_SESS_TYPE_REMOTE
Battery Remaining Longevity: 68 mo
Battery Voltage: 2.98 V
Date Time Interrogation Session: 20150805122618
HIGH POWER IMPEDANCE MEASURED VALUE: 65 Ohm
HighPow Impedance: 65 Ohm
Implantable Pulse Generator Serial Number: 7097390
Lead Channel Impedance Value: 380 Ohm
Lead Channel Impedance Value: 460 Ohm
Lead Channel Sensing Intrinsic Amplitude: 11.7 mV
Lead Channel Sensing Intrinsic Amplitude: 2.6 mV
Lead Channel Setting Pacing Amplitude: 2 V
Lead Channel Setting Pacing Amplitude: 2.25 V
Lead Channel Setting Sensing Sensitivity: 0.5 mV
MDC IDC MSMT BATTERY REMAINING PERCENTAGE: 80 %
MDC IDC MSMT LEADCHNL LV IMPEDANCE VALUE: 710 Ohm
MDC IDC MSMT LEADCHNL LV PACING THRESHOLD AMPLITUDE: 0.875 V
MDC IDC MSMT LEADCHNL LV PACING THRESHOLD PULSEWIDTH: 0.5 ms
MDC IDC MSMT LEADCHNL RV PACING THRESHOLD AMPLITUDE: 1.25 V
MDC IDC MSMT LEADCHNL RV PACING THRESHOLD PULSEWIDTH: 0.5 ms
MDC IDC SET LEADCHNL LV PACING PULSEWIDTH: 0.5 ms
MDC IDC SET LEADCHNL RV PACING PULSEWIDTH: 0.5 ms
MDC IDC SET ZONE DETECTION INTERVAL: 330 ms
Zone Setting Detection Interval: 250 ms
Zone Setting Detection Interval: 300 ms

## 2013-10-01 ENCOUNTER — Encounter: Payer: Self-pay | Admitting: Internal Medicine

## 2013-10-08 ENCOUNTER — Encounter: Payer: Self-pay | Admitting: Cardiology

## 2013-10-17 ENCOUNTER — Encounter: Payer: Self-pay | Admitting: Internal Medicine

## 2013-10-24 ENCOUNTER — Encounter: Payer: Self-pay | Admitting: *Deleted

## 2013-10-24 ENCOUNTER — Ambulatory Visit (INDEPENDENT_AMBULATORY_CARE_PROVIDER_SITE_OTHER): Payer: Medicare Other | Admitting: *Deleted

## 2013-10-24 DIAGNOSIS — I519 Heart disease, unspecified: Secondary | ICD-10-CM

## 2013-10-24 DIAGNOSIS — Z95810 Presence of automatic (implantable) cardiac defibrillator: Secondary | ICD-10-CM

## 2013-10-24 NOTE — Progress Notes (Signed)
EPIC Encounter for ICM Monitoring  Patient Name: Timothy Fritz. is a 73 y.o. male Date: 10/24/2013 Primary Care Physican: [name], MD Primary Cardiologist: Domenic Polite Electrophysiologist: Allred Dry Weight: 192 lbs  Bi-V pacing: 84%       In the past month, have you:  1. Gained more than 2 pounds in a day or more than 5 pounds in a week? no  2. Had changes in your medications (with verification of current medications)? no  3. Had more shortness of breath than is usual for you? no  4. Limited your activity because of shortness of breath? no  5. Not been able to sleep because of shortness of breath? no  6. Had increased swelling in your feet or ankles? no  7. Had symptoms of dehydration (dizziness, dry mouth, increased thirst, decreased urine output) no  8. Had changes in sodium restriction? no  9. Been compliant with medication? Yes   ICM trend:   Follow-up plan: ICM clinic phone appointment: 11/25/13  Copy of note sent to patient's primary care physician, primary cardiologist, and device following physician.  Alvis Lemmings, RN, BSN 10/24/2013 1:17 PM

## 2013-11-08 ENCOUNTER — Encounter: Payer: Self-pay | Admitting: Internal Medicine

## 2013-11-25 ENCOUNTER — Encounter: Payer: Self-pay | Admitting: *Deleted

## 2013-11-25 ENCOUNTER — Telehealth: Payer: Self-pay | Admitting: *Deleted

## 2013-11-25 ENCOUNTER — Ambulatory Visit (INDEPENDENT_AMBULATORY_CARE_PROVIDER_SITE_OTHER): Payer: Medicare Other | Admitting: *Deleted

## 2013-11-25 DIAGNOSIS — Z95810 Presence of automatic (implantable) cardiac defibrillator: Secondary | ICD-10-CM

## 2013-11-25 DIAGNOSIS — I519 Heart disease, unspecified: Secondary | ICD-10-CM

## 2013-11-25 NOTE — Telephone Encounter (Signed)
I spoke with the patient. 

## 2013-11-25 NOTE — Telephone Encounter (Signed)
ICM transmission received. I left a message on the patient's cell # to call. No answer/ no machine at the home #.

## 2013-11-25 NOTE — Progress Notes (Signed)
EPIC Encounter for ICM Monitoring  Patient Name: Timothy Fritz. is a 73 y.o. male Date: 11/25/2013 Primary Care Physican: [name], MD Primary Cardiologist: Domenic Polite Electrophysiologist: Allred Dry Weight: 192 lbs   Bi-V pacing: 85%      In the past month, have you:  1. Gained more than 2 pounds in a day or more than 5 pounds in a week? Yes. At one point over the last month the patient reports his weight was up to 197 lbs, but he does not recall when this happened. His corvue readings were up slightly between ~9/21-9/28. They are currently back to baseline.  2. Had changes in your medications (with verification of current medications)? no  3. Had more shortness of breath than is usual for you? no  4. Limited your activity because of shortness of breath? no  5. Not been able to sleep because of shortness of breath? no  6. Had increased swelling in your feet or ankles? no  7. Had symptoms of dehydration (dizziness, dry mouth, increased thirst, decreased urine output) no  8. Had changes in sodium restriction? no  9. Been compliant with medication? Yes   ICM trend:   Follow-up plan: ICM clinic phone appointment: 12/26/13- full transmission  Copy of note sent to patient's primary care physician, primary cardiologist, and device following physician.  Alvis Lemmings, RN, BSN 11/25/2013 1:49 PM

## 2013-12-15 ENCOUNTER — Encounter: Payer: Self-pay | Admitting: Internal Medicine

## 2013-12-26 ENCOUNTER — Encounter: Payer: Self-pay | Admitting: Internal Medicine

## 2013-12-26 ENCOUNTER — Ambulatory Visit (INDEPENDENT_AMBULATORY_CARE_PROVIDER_SITE_OTHER): Payer: Medicare Other | Admitting: *Deleted

## 2013-12-26 DIAGNOSIS — I428 Other cardiomyopathies: Secondary | ICD-10-CM

## 2013-12-26 DIAGNOSIS — I429 Cardiomyopathy, unspecified: Secondary | ICD-10-CM

## 2013-12-26 LAB — MDC_IDC_ENUM_SESS_TYPE_REMOTE
Battery Voltage: 2.98 V
HighPow Impedance: 65 Ohm
HighPow Impedance: 65 Ohm
Lead Channel Impedance Value: 400 Ohm
Lead Channel Pacing Threshold Amplitude: 0.875 V
Lead Channel Sensing Intrinsic Amplitude: 2.6 mV
Lead Channel Setting Pacing Pulse Width: 0.5 ms
Lead Channel Setting Pacing Pulse Width: 0.5 ms
Lead Channel Setting Sensing Sensitivity: 0.5 mV
MDC IDC MSMT BATTERY REMAINING LONGEVITY: 65 mo
MDC IDC MSMT BATTERY REMAINING PERCENTAGE: 76 %
MDC IDC MSMT LEADCHNL LV IMPEDANCE VALUE: 740 Ohm
MDC IDC MSMT LEADCHNL LV PACING THRESHOLD PULSEWIDTH: 0.5 ms
MDC IDC MSMT LEADCHNL RA IMPEDANCE VALUE: 350 Ohm
MDC IDC MSMT LEADCHNL RV PACING THRESHOLD AMPLITUDE: 1 V
MDC IDC MSMT LEADCHNL RV PACING THRESHOLD PULSEWIDTH: 0.5 ms
MDC IDC MSMT LEADCHNL RV SENSING INTR AMPL: 11.7 mV
MDC IDC PG SERIAL: 7097390
MDC IDC SESS DTM: 20151112102742
MDC IDC SET LEADCHNL LV PACING AMPLITUDE: 2 V
MDC IDC SET LEADCHNL RV PACING AMPLITUDE: 2 V
MDC IDC SET ZONE DETECTION INTERVAL: 300 ms
Zone Setting Detection Interval: 250 ms
Zone Setting Detection Interval: 330 ms

## 2013-12-27 ENCOUNTER — Telehealth: Payer: Self-pay | Admitting: *Deleted

## 2013-12-27 NOTE — Progress Notes (Signed)
Remote ICD transmission.   

## 2013-12-27 NOTE — Telephone Encounter (Signed)
ICM transmission received. I left a message for the patient to call. 

## 2013-12-30 ENCOUNTER — Encounter: Payer: Self-pay | Admitting: *Deleted

## 2013-12-30 NOTE — Progress Notes (Signed)
EPIC Encounter for ICM Monitoring  Patient Name: Timothy Fritz. is a 73 y.o. male Date: 12/30/2013 Primary Care Physican: [name], MD Primary Cardiologist: Domenic Polite Electrophysiologist: Allred Dry Weight: 192 lbs  Bi-V pacing: 85 %      In the past month, have you:  1. Gained more than 2 pounds in a day or more than 5 pounds in a week? Yes. The patient weight was up to 197 lbs in the last few weeks. His corvue readings are up from ~11/7-11/10 and then started to rise again on 11/11. His weight yesterday was 194 lbs. Today he is up to 195 lbs.   2. Had changes in your medications (with verification of current medications)? no  3. Had more shortness of breath than is usual for you? no  4. Limited your activity because of shortness of breath? no  5. Not been able to sleep because of shortness of breath? no  6. Had increased swelling in your feet or ankles? no  7. Had symptoms of dehydration (dizziness, dry mouth, increased thirst, decreased urine output) no  8. Had changes in sodium restriction? no  9. Been compliant with medication? Yes   ICM trend:   Follow-up plan: ICM clinic phone appointment: 01/30/14. The patient has been feeling ok overall. He has had some weight gain. Today his weight is 3 lbs above his baseline, and up 1 lb from yesterday. I have advised him to take an additional lasix 20 mg x 1 dose today. He will evaluate his weight tomorrow and take an additional 20 mg of lasix if his weight remains 195 lbs or above.   Copy of note sent to patient's primary care physician, primary cardiologist, and device following physician.  Alvis Lemmings, RN, BSN 12/30/2013 9:40 AM

## 2013-12-30 NOTE — Telephone Encounter (Signed)
I spoke with the patient. 

## 2013-12-30 NOTE — Addendum Note (Signed)
Addended by: Alvis Lemmings C on: 12/30/2013 09:58 AM   Modules accepted: Level of Service

## 2013-12-30 NOTE — Progress Notes (Signed)
Agree with temporary increase in Lasix to stabilize weight. He should already be familiar with doing this.

## 2014-01-21 ENCOUNTER — Encounter: Payer: Self-pay | Admitting: Cardiology

## 2014-01-23 ENCOUNTER — Encounter (HOSPITAL_COMMUNITY): Payer: Self-pay | Admitting: Internal Medicine

## 2014-01-30 ENCOUNTER — Ambulatory Visit (INDEPENDENT_AMBULATORY_CARE_PROVIDER_SITE_OTHER): Payer: Medicare Other | Admitting: *Deleted

## 2014-01-30 ENCOUNTER — Encounter: Payer: Self-pay | Admitting: *Deleted

## 2014-01-30 DIAGNOSIS — I519 Heart disease, unspecified: Secondary | ICD-10-CM

## 2014-01-30 DIAGNOSIS — Z95810 Presence of automatic (implantable) cardiac defibrillator: Secondary | ICD-10-CM

## 2014-01-30 NOTE — Progress Notes (Signed)
EPIC Encounter for ICM Monitoring  Patient Name: Timothy Fritz. is a 73 y.o. male Date: 01/30/2014 Primary Care Physican: [name], MD Primary Cardiologist: Domenic Polite Electrophysiologist: Allred Dry Weight: 195 lbs  Bi-V pacing: 85%       In the past month, have you:  1. Gained more than 2 pounds in a day or more than 5 pounds in a week? no  2. Had changes in your medications (with verification of current medications)? no  3. Had more shortness of breath than is usual for you? no  4. Limited your activity because of shortness of breath? no  5. Not been able to sleep because of shortness of breath? no  6. Had increased swelling in your feet or ankles? no  7. Had symptoms of dehydration (dizziness, dry mouth, increased thirst, decreased urine output) no  8. Had changes in sodium restriction? no  9. Been compliant with medication? Yes   ICM trend:   Follow-up plan: ICM clinic phone appointment: 03/06/14  Copy of note sent to patient's primary care physician, primary cardiologist, and device following physician.  Alvis Lemmings, RN, BSN 01/30/2014 4:58 PM

## 2014-02-19 ENCOUNTER — Encounter: Payer: Self-pay | Admitting: Cardiology

## 2014-02-19 ENCOUNTER — Ambulatory Visit (INDEPENDENT_AMBULATORY_CARE_PROVIDER_SITE_OTHER): Payer: Medicare Other | Admitting: Cardiology

## 2014-02-19 VITALS — BP 112/74 | HR 81 | Ht 68.0 in | Wt 199.1 lb

## 2014-02-19 DIAGNOSIS — I429 Cardiomyopathy, unspecified: Secondary | ICD-10-CM

## 2014-02-19 DIAGNOSIS — I4891 Unspecified atrial fibrillation: Secondary | ICD-10-CM

## 2014-02-19 DIAGNOSIS — Z95810 Presence of automatic (implantable) cardiac defibrillator: Secondary | ICD-10-CM

## 2014-02-19 DIAGNOSIS — I5022 Chronic systolic (congestive) heart failure: Secondary | ICD-10-CM

## 2014-02-19 DIAGNOSIS — I428 Other cardiomyopathies: Secondary | ICD-10-CM

## 2014-02-19 DIAGNOSIS — I2510 Atherosclerotic heart disease of native coronary artery without angina pectoris: Secondary | ICD-10-CM

## 2014-02-19 DIAGNOSIS — Z954 Presence of other heart-valve replacement: Secondary | ICD-10-CM

## 2014-02-19 NOTE — Assessment & Plan Note (Signed)
LVEF 40-45% range.

## 2014-02-19 NOTE — Assessment & Plan Note (Signed)
St. Jude CRT-D, followed by Dr. Rayann Heman.

## 2014-02-19 NOTE — Patient Instructions (Signed)

## 2014-02-19 NOTE — Assessment & Plan Note (Signed)
He is not reporting any active angina symptoms.

## 2014-02-19 NOTE — Assessment & Plan Note (Signed)
Symptomatically stable, NYHA class II dyspnea. Weight is up only a few pounds. No change to current regimen. Did recommend that he get back to a regular walking regimen and keep an eye on his diet.

## 2014-02-19 NOTE — Assessment & Plan Note (Signed)
Continues on Coumadin.

## 2014-02-19 NOTE — Assessment & Plan Note (Signed)
Chronic, continues on beta blocker and Coumadin.

## 2014-02-19 NOTE — Progress Notes (Signed)
Reason for visit: Cardiomyopathy, valvular heart disease  Clinical Summary Mr. Timothy Fritz is a 74 y.o.male last seen in July 2015. Remote device interrogation continues with Dr. Rayann Heman. Recent CorVue trends stable as of mid December. His weight is up a few pounds since last visit. States that he ate more than usual over the holidays. Otherwise no orthopnea, PND, or leg edema. His medications remain unchanged. He reports no sense of palpitations, no chest pain, NYHA class II dyspnea. Has not been exercising as regularly.  ECG today showed a ventricular paced rhythm with probable underlying atrial flutter/fibrillation.  Echocardiogram from March 2015 showed improvement in LVEF to the range of 40-45%, stable aortic prosthesis with reportedly moderate regurgitation (I reviewed the images and there is trace to mild aortic regurgitation), mild mitral regurgitation, severe left atrial enlargement, mildly dilated right ventricle, moderate to severe right atrial enlargement, mild to moderate tricuspid regurgitation.  No Known Allergies  Current Outpatient Prescriptions  Medication Sig Dispense Refill  . atorvastatin (LIPITOR) 20 MG tablet Take 1 tablet (20 mg total) by mouth every evening. 90 tablet 3  . furosemide (LASIX) 20 MG tablet Take 1 tablet (20 mg total) by mouth daily. 90 tablet 3  . losartan (COZAAR) 50 MG tablet Take 1 tablet (50 mg total) by mouth daily. 90 tablet 3  . meclizine (ANTIVERT) 25 MG tablet Take 25 mg by mouth 3 (three) times daily as needed for dizziness.    . metoprolol succinate (TOPROL-XL) 100 MG 24 hr tablet Take 1&1/2 tablets by mouth twice daily 270 tablet 3  . Multiple Vitamin (MULTIVITAMIN WITH MINERALS) TABS Take 1 tablet by mouth daily.    . Omega-3 Fatty Acids (FISH OIL) 1000 MG CAPS Take 1,000 mg by mouth daily.     . Tamsulosin HCl (FLOMAX) 0.4 MG CAPS Take 0.4 mg by mouth daily after supper.     . warfarin (COUMADIN) 5 MG tablet Take 2.5-5 mg by mouth every  evening. 1 tablet (5 mg) on Sunday, Tuesday and Thursday, 1/2 tablet (2.5 mg) on all other days MANAGED BY PMD     No current facility-administered medications for this visit.    Past Medical History  Diagnosis Date  . Coronary atherosclerosis of native coronary artery     Nonobstructive at cardiac catherization 2002  . Mixed hyperlipidemia   . Essential hypertension, benign   . BPH (benign prostatic hypertrophy)   . Nonischemic cardiomyopathy     CRT-D  . Aortic valve, bicuspid     Mechanical AVR  . Aortic aneurysm, thoracic     Fusiform s/p Bentall procedure 2002  . Arthritis   . Basal cell carcinoma of face   . Persistent atrial fibrillation   . Left bundle branch block     Past Surgical History  Procedure Laterality Date  . Bentall procedure      Dr Cyndia Bent 2002  . Bi-ventricular implantable cardioverter defibrillator  (crt-d)  07/11/2012    [facility] Jude Medical Quadra Assura- Dr. Rayann Heman  . Tonsillectomy and adenoidectomy  1949  . Cardiac valve replacement    . Cataract extraction Left 01/21/13  . Bi-ventricular implantable cardioverter defibrillator N/A 07/12/2012    Procedure: BI-VENTRICULAR IMPLANTABLE CARDIOVERTER DEFIBRILLATOR  (CRT-D);  Surgeon: [name], MD;  Location: [facility] CATH LAB;  Service: Cardiovascular;  Laterality: N/A;    Social History Mr. Brickle reports that he quit smoking about 14 years ago. His smoking use included Cigarettes. He has a 41 pack-year smoking history. He has never used smokeless  tobacco. Mr. Caratachea reports that he drinks about 1.2 oz of alcohol per week.  Review of Systems Complete review of systems negative except as otherwise outlined in the clinical summary and also the following. No bleeding on Coumadin.  Physical Examination Filed Vitals:   02/19/14 0905  BP: 112/74  Pulse: 81   Filed Weights   02/19/14 0905  Weight: 199 lb 1.9 oz (90.32 kg)    Appears comfortable at rest.  HEENT: Conjunctiva and lids normal, oropharynx  clear.  Neck: Supple, no carotid bruits, no thyromegaly.  Lungs: Clear to auscultation, nonlabored.  Cardiac: Regular, crisp prosthetic mechanical aortic valve click, no diastolic murmur, no S3 gallop.  Abdomen: Soft, nontender, no hepatomegaly, no bruits.  Extremities: No significant pitting edema, few small varicosities. distal pulses are 2+.    Problem List and Plan   Chronic systolic heart failure Symptomatically stable, NYHA class II dyspnea. Weight is up only a few pounds. No change to current regimen. Did recommend that he get back to a regular walking regimen and keep an eye on his diet.  Nonischemic cardiomyopathy LVEF 40-45% range.  Atrial fibrillation Chronic, continues on beta blocker and Coumadin.  CORONARY ATHEROSCLEROSIS NATIVE CORONARY ARTERY He is not reporting any active angina symptoms.  S/P aortic valve replacement with metallic valve Continues on Coumadin.  ICD (implantable cardioverter-defibrillator) in place St. Jude CRT-D, followed by Dr. Rayann Heman.    Satira Sark, M.D., F.A.C.C.

## 2014-03-06 ENCOUNTER — Ambulatory Visit (INDEPENDENT_AMBULATORY_CARE_PROVIDER_SITE_OTHER): Payer: Medicare Other | Admitting: *Deleted

## 2014-03-06 ENCOUNTER — Encounter: Payer: Self-pay | Admitting: *Deleted

## 2014-03-06 DIAGNOSIS — I5022 Chronic systolic (congestive) heart failure: Secondary | ICD-10-CM

## 2014-03-06 DIAGNOSIS — Z95810 Presence of automatic (implantable) cardiac defibrillator: Secondary | ICD-10-CM

## 2014-03-06 NOTE — Progress Notes (Signed)
EPIC Encounter for ICM Monitoring  Patient Name: Timothy Fritz. is a 74 y.o. male Date: 03/06/2014 Primary Care Physican: [name], MD Primary Cardiologist: Domenic Polite Electrophysiologist: Allred Dry Weight: 195 lbs  Bi-V pacing: 85%      In the past month, have you:  1. Gained more than 2 pounds in a day or more than 5 pounds in a week? No. The patient reports a 1-2 lb fluctuation of his weight through the holidays, but no change in his symptoms.  2. Had changes in your medications (with verification of current medications)? no  3. Had more shortness of breath than is usual for you? no  4. Limited your activity because of shortness of breath? no  5. Not been able to sleep because of shortness of breath? no  6. Had increased swelling in your feet or ankles? no  7. Had symptoms of dehydration (dizziness, dry mouth, increased thirst, decreased urine output) no  8. Had changes in sodium restriction? no  9. Been compliant with medication? Yes   ICM trend:   Follow-up plan: ICM clinic phone appointment : 04/07/14  Copy of note sent to patient's primary care physician, primary cardiologist, and device following physician.  Alvis Lemmings, RN, BSN 03/06/2014 3:04 PM

## 2014-04-07 ENCOUNTER — Ambulatory Visit (INDEPENDENT_AMBULATORY_CARE_PROVIDER_SITE_OTHER): Payer: Medicare Other | Admitting: *Deleted

## 2014-04-07 DIAGNOSIS — I429 Cardiomyopathy, unspecified: Secondary | ICD-10-CM

## 2014-04-07 DIAGNOSIS — Z95810 Presence of automatic (implantable) cardiac defibrillator: Secondary | ICD-10-CM

## 2014-04-07 DIAGNOSIS — I428 Other cardiomyopathies: Secondary | ICD-10-CM

## 2014-04-07 DIAGNOSIS — I5022 Chronic systolic (congestive) heart failure: Secondary | ICD-10-CM

## 2014-04-07 NOTE — Progress Notes (Signed)
Remote ICD transmission.   

## 2014-04-09 ENCOUNTER — Encounter: Payer: Self-pay | Admitting: *Deleted

## 2014-04-09 LAB — MDC_IDC_ENUM_SESS_TYPE_REMOTE
Battery Voltage: 2.98 V
HighPow Impedance: 61 Ohm
HighPow Impedance: 61 Ohm
Implantable Pulse Generator Serial Number: 7097390
Lead Channel Impedance Value: 360 Ohm
Lead Channel Impedance Value: 430 Ohm
Lead Channel Setting Pacing Pulse Width: 0.5 ms
MDC IDC MSMT BATTERY REMAINING LONGEVITY: 62 mo
MDC IDC MSMT BATTERY REMAINING PERCENTAGE: 73 %
MDC IDC MSMT LEADCHNL LV IMPEDANCE VALUE: 700 Ohm
MDC IDC MSMT LEADCHNL LV PACING THRESHOLD AMPLITUDE: 0.875 V
MDC IDC MSMT LEADCHNL LV PACING THRESHOLD PULSEWIDTH: 0.5 ms
MDC IDC MSMT LEADCHNL RA SENSING INTR AMPL: 2.6 mV
MDC IDC MSMT LEADCHNL RV PACING THRESHOLD AMPLITUDE: 1.25 V
MDC IDC MSMT LEADCHNL RV PACING THRESHOLD PULSEWIDTH: 0.5 ms
MDC IDC MSMT LEADCHNL RV SENSING INTR AMPL: 11.7 mV
MDC IDC SESS DTM: 20160222070017
MDC IDC SET LEADCHNL LV PACING AMPLITUDE: 2 V
MDC IDC SET LEADCHNL RV PACING AMPLITUDE: 2.25 V
MDC IDC SET LEADCHNL RV PACING PULSEWIDTH: 0.5 ms
MDC IDC SET LEADCHNL RV SENSING SENSITIVITY: 0.5 mV
MDC IDC SET ZONE DETECTION INTERVAL: 250 ms
MDC IDC STAT BRADY RV PERCENT PACED: 86 %
Zone Setting Detection Interval: 300 ms
Zone Setting Detection Interval: 330 ms

## 2014-04-09 NOTE — Addendum Note (Signed)
Addended by: Alvis Lemmings C on: 04/09/2014 03:43 PM   Modules accepted: Level of Service

## 2014-04-09 NOTE — Progress Notes (Signed)
EPIC Encounter for ICM Monitoring  Patient Name: Timothy Fritz. is a 74 y.o. male Date: 04/09/2014 Primary Care Physican: [name], MD Primary Cardiologist: Domenic Polite Electrophysiologist: Allred Dry Weight: 195 lbs  Bi-V pacing: 86%       In the past month, have you:  1. Gained more than 2 pounds in a day or more than 5 pounds in a week? No. The patient has had a gradual increase in his weight to 198 lbs.   2. Had changes in your medications (with verification of current medications)? no  3. Had more shortness of breath than is usual for you? no  4. Limited your activity because of shortness of breath? no  5. Not been able to sleep because of shortness of breath? no  6. Had increased swelling in your feet or ankles? no  7. Had symptoms of dehydration (dizziness, dry mouth, increased thirst, decreased urine output) no  8. Had changes in sodium restriction? no  9. Been compliant with medication? Yes   ICM trend:   Follow-up plan: ICM clinic phone appointment: 05/12/14. The patient's impedence was below baseline from ~ 2/4-2/10 and then again from ~ 2/17- present. The patient states he has done nothing different with his oral fluid intake or sodium intake. He does state his weight is now up to 198 lbs from 195 lbs. He currently takes lasix 20 mg once daily. I have advised him to increase lasix to 40 mg x three days, then resume 20 mg daily. He voices understanding.   Copy of note sent to patient's primary care physician, primary cardiologist, and device following physician.  Alvis Lemmings, RN, BSN 04/09/2014 3:38 PM

## 2014-04-14 ENCOUNTER — Encounter: Payer: Self-pay | Admitting: Cardiology

## 2014-04-21 ENCOUNTER — Encounter: Payer: Self-pay | Admitting: Internal Medicine

## 2014-05-12 ENCOUNTER — Ambulatory Visit (INDEPENDENT_AMBULATORY_CARE_PROVIDER_SITE_OTHER): Payer: Medicare Other | Admitting: *Deleted

## 2014-05-12 DIAGNOSIS — I5022 Chronic systolic (congestive) heart failure: Secondary | ICD-10-CM

## 2014-05-12 DIAGNOSIS — Z95810 Presence of automatic (implantable) cardiac defibrillator: Secondary | ICD-10-CM | POA: Diagnosis not present

## 2014-05-15 ENCOUNTER — Encounter: Payer: Self-pay | Admitting: *Deleted

## 2014-05-15 NOTE — Progress Notes (Signed)
EPIC Encounter for ICM Monitoring  Patient Name: Timothy Fritz. is a 74 y.o. male Date: 05/15/2014 Primary Care Physican: [name], MD Primary Cardiologist: Domenic Polite Electrophysiologist: Allred Dry Weight: 196 lbs  Bi-V pacing: 86%       In the past month, have you:  1. Gained more than 2 pounds in a day or more than 5 pounds in a week? no  2. Had changes in your medications (with verification of current medications)? no  3. Had more shortness of breath than is usual for you? no  4. Limited your activity because of shortness of breath? no  5. Not been able to sleep because of shortness of breath? no  6. Had increased swelling in your feet or ankles? no  7. Had symptoms of dehydration (dizziness, dry mouth, increased thirst, decreased urine output) no  8. Had changes in sodium restriction? no  9. Been compliant with medication? Yes   ICM trend:   Follow-up plan: ICM clinic phone appointment: 06/16/14. The patient's impedence was below baseline from ~ 3/6-3/12. Per the patient's report, he has noticed no change in his symptoms/ weight. No changes made today.   Copy of note sent to patient's primary care physician, primary cardiologist, and device following physician.  Alvis Lemmings, RN, BSN 05/15/2014 9:14 AM

## 2014-06-04 ENCOUNTER — Other Ambulatory Visit: Payer: Self-pay | Admitting: Internal Medicine

## 2014-06-16 ENCOUNTER — Ambulatory Visit (INDEPENDENT_AMBULATORY_CARE_PROVIDER_SITE_OTHER): Payer: Medicare Other | Admitting: *Deleted

## 2014-06-16 DIAGNOSIS — Z95810 Presence of automatic (implantable) cardiac defibrillator: Secondary | ICD-10-CM | POA: Diagnosis not present

## 2014-06-16 DIAGNOSIS — I5022 Chronic systolic (congestive) heart failure: Secondary | ICD-10-CM

## 2014-06-18 ENCOUNTER — Encounter: Payer: Self-pay | Admitting: *Deleted

## 2014-06-18 NOTE — Progress Notes (Signed)
EPIC Encounter for ICM Monitoring  Patient Name: Timothy Fritz. is a 74 y.o. male Date: 06/18/2014 Primary Care Physican: [name], MD Primary Cardiologist: Domenic Polite Electrophysiologist: Allred Dry Weight: 196 lbs  Bi-V pacing: 85%       In the past month, have you:  1. Gained more than 2 pounds in a day or more than 5 pounds in a week? no  2. Had changes in your medications (with verification of current medications)? no  3. Had more shortness of breath than is usual for you? no  4. Limited your activity because of shortness of breath? no  5. Not been able to sleep because of shortness of breath? no  6. Had increased swelling in your feet or ankles? no  7. Had symptoms of dehydration (dizziness, dry mouth, increased thirst, decreased urine output) no  8. Had changes in sodium restriction? no  9. Been compliant with medication? Yes   ICM trend:   Follow-up plan: ICM clinic phone appointment: 08/25/14. No changes made today. The patient is scheduled to follow up on 07/25/14 with Dr. Rayann Heman.   Copy of note sent to patient's primary care physician, primary cardiologist, and device following physician.  Alvis Lemmings, RN, BSN 06/18/2014 9:34 AM

## 2014-07-25 ENCOUNTER — Encounter: Payer: Self-pay | Admitting: Internal Medicine

## 2014-07-25 ENCOUNTER — Ambulatory Visit (INDEPENDENT_AMBULATORY_CARE_PROVIDER_SITE_OTHER): Payer: Medicare Other | Admitting: Internal Medicine

## 2014-07-25 VITALS — BP 122/80 | HR 81 | Ht 68.0 in | Wt 200.0 lb

## 2014-07-25 DIAGNOSIS — I429 Cardiomyopathy, unspecified: Secondary | ICD-10-CM

## 2014-07-25 DIAGNOSIS — I480 Paroxysmal atrial fibrillation: Secondary | ICD-10-CM | POA: Diagnosis not present

## 2014-07-25 DIAGNOSIS — I5022 Chronic systolic (congestive) heart failure: Secondary | ICD-10-CM | POA: Diagnosis not present

## 2014-07-25 DIAGNOSIS — I428 Other cardiomyopathies: Secondary | ICD-10-CM

## 2014-07-25 LAB — CUP PACEART INCLINIC DEVICE CHECK
Battery Remaining Longevity: 57.6 mo
HighPow Impedance: 66.375
Lead Channel Impedance Value: 425 Ohm
Lead Channel Pacing Threshold Amplitude: 1.125 V
Lead Channel Pacing Threshold Pulse Width: 0.5 ms
Lead Channel Setting Pacing Pulse Width: 0.5 ms
Lead Channel Setting Pacing Pulse Width: 0.5 ms
Lead Channel Setting Sensing Sensitivity: 0.5 mV
MDC IDC MSMT LEADCHNL LV IMPEDANCE VALUE: 712.5 Ohm
MDC IDC MSMT LEADCHNL LV PACING THRESHOLD AMPLITUDE: 0.75 V
MDC IDC MSMT LEADCHNL RA IMPEDANCE VALUE: 350 Ohm
MDC IDC MSMT LEADCHNL RA SENSING INTR AMPL: 1 mV
MDC IDC MSMT LEADCHNL RV PACING THRESHOLD PULSEWIDTH: 0.5 ms
MDC IDC MSMT LEADCHNL RV SENSING INTR AMPL: 11.7 mV
MDC IDC PG SERIAL: 7097390
MDC IDC SESS DTM: 20160610110732
MDC IDC SET LEADCHNL LV PACING AMPLITUDE: 2 V
MDC IDC SET LEADCHNL RV PACING AMPLITUDE: 2.125
MDC IDC SET ZONE DETECTION INTERVAL: 250 ms
MDC IDC SET ZONE DETECTION INTERVAL: 300 ms
MDC IDC STAT BRADY RA PERCENT PACED: 0 %
MDC IDC STAT BRADY RV PERCENT PACED: 86 %
Zone Setting Detection Interval: 330 ms

## 2014-07-25 NOTE — Patient Instructions (Signed)
Your physician recommends that you continue on your current medications as directed. Please refer to the Current Medication list given to you today. [facility] device check on 10/27/14. Your physician recommends that you schedule a follow-up appointment in: 12 months with Dr. Rayann Heman. You will receive a reminder letter in the mail in about 12 months reminding you to call and schedule your appointment. If you don't receive this letter, please contact our office. Continue ICM clinic with Alvis Lemmings.

## 2014-07-25 NOTE — Progress Notes (Signed)
PCP: [name], MD Primary Cardiologist:  Dr Leitha Bleak Sr. is a 74 y.o. male who presents today for routine electrophysiology followup.  Since his last visit, the patient reports doing very well.  He enjoys fishing and gardening and does not feel limited at this time.  He liked to pond fish for bass.  He goes about once per week.  Today, he denies symptoms of palpitations, chest pain, shortness of breath,  lower extremity edema, dizziness, presyncope, syncope, or ICD shocks.  The patient is otherwise without complaint today.   Past Medical History  Diagnosis Date  . Coronary atherosclerosis of native coronary artery     Nonobstructive at cardiac catherization 2002  . Mixed hyperlipidemia   . Essential hypertension, benign   . BPH (benign prostatic hypertrophy)   . Nonischemic cardiomyopathy     CRT-D  . Aortic valve, bicuspid     Mechanical AVR  . Aortic aneurysm, thoracic     Fusiform s/p Bentall procedure 2002  . Arthritis   . Basal cell carcinoma of face   . Persistent atrial fibrillation   . Left bundle branch block    Past Surgical History  Procedure Laterality Date  . Bentall procedure      Dr Cyndia Bent 2002  . Bi-ventricular implantable cardioverter defibrillator  (crt-d)  07/11/2012    [facility] Jude Medical Quadra [employer]ura- Dr. Rayann Heman  . Tonsillectomy and adenoidectomy  1949  . Cardiac valve replacement    . Cataract extraction Left 01/21/13  . Bi-ventricular implantable cardioverter defibrillator N/A 07/12/2012    Procedure: BI-VENTRICULAR IMPLANTABLE CARDIOVERTER DEFIBRILLATOR  (CRT-D);  Surgeon: [name], MD;  Location: [facility] CATH LAB;  Service: Cardiovascular;  Laterality: N/A;    Current Outpatient Prescriptions  Medication Sig Dispense Refill  . atorvastatin (LIPITOR) 20 MG tablet Take 1 tablet (20 mg total) by mouth every evening. 90 tablet 3  . furosemide (LASIX) 20 MG tablet Take 1 tablet (20 mg total) by mouth daily. 90 tablet 3  . losartan (COZAAR) 50  MG tablet Take 1 tablet (50 mg total) by mouth daily. 90 tablet 3  . meclizine (ANTIVERT) 25 MG tablet Take 25 mg by mouth 3 (three) times daily as needed for dizziness.    . metoprolol succinate (TOPROL-XL) 100 MG 24 hr tablet TAKE 1&1/2 TABLETS BY MOUTH TWICE DAILY 270 tablet 3  . Multiple Vitamin (MULTIVITAMIN WITH MINERALS) TABS Take 1 tablet by mouth daily.    . Omega-3 Fatty Acids (FISH OIL) 1000 MG CAPS Take 1,000 mg by mouth daily.     . Tamsulosin HCl (FLOMAX) 0.4 MG CAPS Take 0.4 mg by mouth daily after supper.     . warfarin (COUMADIN) 5 MG tablet Take 2.5-5 mg by mouth every evening. 1 tablet (5 mg) on Sunday, Tuesday and Thursday, 1/2 tablet (2.5 mg) on all other days MANAGED BY PMD     No current facility-administered medications for this visit.   ROS- all systems are reviewed and negative except as per HPI above  Physical Exam: Filed Vitals:   07/25/14 1039  BP: 122/80  Pulse: 81  Height: 5\' 8"  (1.727 m)  Weight: 90.719 kg (200 lb)  SpO2: 97%    GEN- The patient is well appearing, alert and oriented x 3 today.   Head- normocephalic, atraumatic Eyes-  Sclera clear, conjunctiva pink Ears- hearing intact Oropharynx- clear Lungs- Clear to ausculation bilaterally, normal work of breathing Chest- ICD pocket is well healed Heart- Regular rate and rhythm (paced), mechanical  S2 GI- soft, NT, ND, + BS Extremities- no clubbing, cyanosis, or edema  ICD interrogation- reviewed in detail today,  See PACEART report  Assessment and Plan:  1.  Chronic systolic dysfunction euvolemic today, [place] is followed by Alvis Lemmings Stable on an appropriate medical regimen Normal BiV ICD function See [name] Art report  He is 86% BiV paced   We have discussed AV nodal ablation several times.  As he is doing so well, he declines.  He will reconsider with any clinical change.  2. Persistent afib Anticoagulated with coumadin for valvular afib  3. Mechanical AVR Continue coumadin  long term  4. HTN Stable No change required today  [facility] Follow-up with Dr Domenic Polite as scheduled I will see again in 1 year

## 2014-08-11 ENCOUNTER — Other Ambulatory Visit: Payer: Self-pay

## 2014-08-25 ENCOUNTER — Ambulatory Visit (INDEPENDENT_AMBULATORY_CARE_PROVIDER_SITE_OTHER): Payer: Medicare Other | Admitting: *Deleted

## 2014-08-25 ENCOUNTER — Ambulatory Visit (INDEPENDENT_AMBULATORY_CARE_PROVIDER_SITE_OTHER): Payer: Medicare Other | Admitting: Cardiology

## 2014-08-25 ENCOUNTER — Encounter: Payer: Self-pay | Admitting: Cardiology

## 2014-08-25 VITALS — BP 121/82 | HR 82 | Ht 68.0 in | Wt 202.0 lb

## 2014-08-25 DIAGNOSIS — I2510 Atherosclerotic heart disease of native coronary artery without angina pectoris: Secondary | ICD-10-CM

## 2014-08-25 DIAGNOSIS — I429 Cardiomyopathy, unspecified: Secondary | ICD-10-CM

## 2014-08-25 DIAGNOSIS — Z954 Presence of other heart-valve replacement: Secondary | ICD-10-CM

## 2014-08-25 DIAGNOSIS — I428 Other cardiomyopathies: Secondary | ICD-10-CM

## 2014-08-25 DIAGNOSIS — Z95810 Presence of automatic (implantable) cardiac defibrillator: Secondary | ICD-10-CM

## 2014-08-25 DIAGNOSIS — I5022 Chronic systolic (congestive) heart failure: Secondary | ICD-10-CM

## 2014-08-25 NOTE — Progress Notes (Signed)
Cardiology Office Note  Date: 08/25/2014   ID: Timothy Fowler Sr., DOB 1940/06/07, MRN [id number]  PCP: [name], MD  Primary Cardiologist: Rozann Lesches, MD   Chief Complaint  Patient presents with  . Cardiomyopathy  . History of AVR    History of Present Illness: Timothy Merry. is a 74 y.o. male last seen in January. Interval visit with Dr. Rayann Heman noted in June, doing well at that time. He continues to do well in terms of cardiomyopathy, no significant volume overload, stable NYHA class II dyspnea.  We reviewed his medications, no significant changes. He continues on Coumadin without active bleeding problems. This is followed by Dr. Manuella Ghazi.  He has been working out in his garden and yard, no regular walking regimen at this time.   Past Medical History  Diagnosis Date  . Coronary atherosclerosis of native coronary artery     Nonobstructive at cardiac catherization 2002  . Mixed hyperlipidemia   . Essential hypertension, benign   . BPH (benign prostatic hypertrophy)   . Nonischemic cardiomyopathy     CRT-D  . Aortic valve, bicuspid     Mechanical AVR  . Aortic aneurysm, thoracic     Fusiform s/p Bentall procedure 2002  . Arthritis   . Basal cell carcinoma of face   . Persistent atrial fibrillation   . Left bundle branch block     Current Outpatient Prescriptions  Medication Sig Dispense Refill  . atorvastatin (LIPITOR) 20 MG tablet Take 1 tablet (20 mg total) by mouth every evening. 90 tablet 3  . furosemide (LASIX) 20 MG tablet Take 1 tablet (20 mg total) by mouth daily. 90 tablet 3  . losartan (COZAAR) 50 MG tablet Take 1 tablet (50 mg total) by mouth daily. 90 tablet 3  . metoprolol succinate (TOPROL-XL) 100 MG 24 hr tablet TAKE 1&1/2 TABLETS BY MOUTH TWICE DAILY 270 tablet 3  . Multiple Vitamin (MULTIVITAMIN WITH MINERALS) TABS Take 1 tablet by mouth daily.    . Omega-3 Fatty Acids (FISH OIL) 1000 MG CAPS Take 1,000 mg by mouth daily.     .  Tamsulosin HCl (FLOMAX) 0.4 MG CAPS Take 0.4 mg by mouth daily after supper.     . warfarin (COUMADIN) 5 MG tablet Take 2.5-5 mg by mouth every evening. 1 tablet (5 mg) on Sunday, Tuesday and Thursday, 1/2 tablet (2.5 mg) on all other days MANAGED BY PMD     No current facility-administered medications for this visit.    Allergies:  Review of patient's allergies indicates no known allergies.   Social History: The patient  reports that he quit smoking about 14 years ago. His smoking use included Cigarettes. He has a 41 pack-year smoking history. He has never used smokeless tobacco. He reports that he drinks about 1.2 oz of alcohol per week. He reports that he does not use illicit drugs.    ROS:  Please see the history of present illness. Otherwise, complete review of systems is positive for none.  All other systems are reviewed and negative.   Physical Exam: VS:  BP 121/82 mmHg  Pulse 82  Ht 5\' 8"  (1.727 m)  Wt 202 lb (91.627 kg)  BMI 30.72 kg/m2  SpO2 96%, BMI Body mass index is 30.72 kg/(m^2).  Wt Readings from Last 3 Encounters:  08/25/14 202 lb (91.627 kg)  07/25/14 200 lb (90.719 kg)  02/19/14 199 lb 1.9 oz (90.32 kg)     Appears comfortable at rest.  HEENT: Conjunctiva  and lids normal, oropharynx clear.  Neck: Supple, no carotid bruits, no thyromegaly.  Lungs: Clear to auscultation, nonlabored.  Cardiac: Regular, crisp prosthetic mechanical aortic valve click, no diastolic murmur, no S3 gallop.  Abdomen: Soft, nontender, no hepatomegaly, no bruits.  Extremities: No significant pitting edema, few small varicosities. distal pulses are 2+.    ECG: ECG is not ordered today.  Other Studies Reviewed Today:  Echocardiogram from March 2015 showed improvement in LVEF to the range of 40-45%, stable aortic prosthesis with reportedly moderate regurgitation (I reviewed the images and there is trace to mild aortic regurgitation), mild mitral regurgitation, severe left atrial  enlargement, mildly dilated right ventricle, moderate to severe right atrial enlargement, mild to moderate tricuspid regurgitation.  Assessment and Plan:  1. Nonischemic cardiomyopathy, LVEF 40-45% as of echocardiogram from last year. Symptomatically stable without evidence of volume overload on current regimen. Continue observation.  2. History of mechanical AVR at time of Bentall procedure 2002. Valve stable by echocardiogram from March 2015, mild aortic regurgitation noted. He continues on Coumadin.  3. Obstructive CAD, no active angina.  4. St. Jude CRT-D, keep follow-up with Dr. Rayann Heman.  Current medicines were reviewed with the patient today.  Disposition: FU with me in 6 months.   Signed, Satira Sark, MD, [facility] 08/25/2014 11:00 AM    [place] [facility] [place], [place], [place] [address] Phone: [phone]; Fax: [phone]

## 2014-08-25 NOTE — Patient Instructions (Signed)
Your physician recommends that you continue on your current medications as directed. Please refer to the Current Medication list given to you today. Your physician recommends that you schedule a follow-up appointment in: 6 months. You will receive a reminder letter in the mail in about 4 months reminding you to call and schedule your appointment. If you don't receive this letter, please contact our office. 

## 2014-08-28 ENCOUNTER — Encounter: Payer: Self-pay | Admitting: *Deleted

## 2014-08-28 NOTE — Progress Notes (Signed)
EPIC Encounter for ICM Monitoring  Patient Name: Timothy Fritz. is a 74 y.o. male Date: 08/28/2014 Primary Care Physican: [name], MD Primary Cardiologist: Domenic Polite Electrophysiologist: Allred Dry Weight: 196 lbs  Bi-V pacing: 86%       In the past month, have you:  1. Gained more than 2 pounds in a day or more than 5 pounds in a week? no  2. Had changes in your medications (with verification of current medications)? no  3. Had more shortness of breath than is usual for you? no  4. Limited your activity because of shortness of breath? no  5. Not been able to sleep because of shortness of breath? no  6. Had increased swelling in your feet or ankles? no  7. Had symptoms of dehydration (dizziness, dry mouth, increased thirst, decreased urine output) no  8. Had changes in sodium restriction? no  9. Been compliant with medication? Yes   ICM trend:   Follow-up plan: ICM clinic phone appointment: 09/29/14. The patient's corvue readings were slightly elevated from 6/25-6/30. He reports that he was out of town at that time for a funeral. No change in symptoms noted. The patient has recently seen Dr. Rayann Heman on 6/10 and Dr. Domenic Polite on 7/11. No changes will be made today.   Copy of note sent to patient's primary care physician, primary cardiologist, and device following physician.  Alvis Lemmings, RN, BSN 08/28/2014 1:47 PM

## 2014-09-25 ENCOUNTER — Telehealth: Payer: Self-pay | Admitting: Internal Medicine

## 2014-09-25 NOTE — Telephone Encounter (Signed)
New message     Pt will be going to the [place] for 2 weeks Pt wanted to check to see if he needs to take any precautions since he has a defib/pacemaker and will be flying Please call to discuss

## 2014-09-25 NOTE — Telephone Encounter (Signed)
Called patient back about traveling question.  Explained to patient that he needs to bring his [facility] ID card with him and that he should tell security/TSA at each airport (prior to screening) that he has a defibrillator so that he can be screened appropriately.  Patient voices understanding and denies any additional questions or concerns at this time.

## 2014-09-29 ENCOUNTER — Ambulatory Visit (INDEPENDENT_AMBULATORY_CARE_PROVIDER_SITE_OTHER): Payer: Medicare Other | Admitting: *Deleted

## 2014-09-29 DIAGNOSIS — I5022 Chronic systolic (congestive) heart failure: Secondary | ICD-10-CM | POA: Diagnosis not present

## 2014-09-29 DIAGNOSIS — Z95810 Presence of automatic (implantable) cardiac defibrillator: Secondary | ICD-10-CM | POA: Diagnosis not present

## 2014-09-29 NOTE — Progress Notes (Signed)
EPIC Encounter for ICM Monitoring  Patient Name: Timothy Fritz. is a 74 y.o. male Date: 09/29/2014 Primary Care Physican: [name], MD Primary Cardiologist: Domenic Polite  Electrophysiologist: Allred  Dry Weight: 198 lbs       In the past month, have you:  1. Gained more than 2 pounds in a day or more than 5 pounds in a week? Patient stated he has had 2 lb weight gain but forgot to take his pills yesterday.    2. Had changes in your medications (with verification of current medications)? no  3. Had more shortness of breath than is usual for you? no  4. Limited your activity because of shortness of breath? no  5. Not been able to sleep because of shortness of breath? no  6. Had increased swelling in your feet or ankles? no  7. Had symptoms of dehydration (dizziness, dry mouth, increased thirst, decreased urine output) no  8. Had changes in sodium restriction? no  9. Been compliant with medication? Yes   ICM trend:   Follow-up plan: ICM clinic phone appointment 10/30/2014.    Copy of note sent to patient's primary care physician, primary cardiologist, and device following physician. Patient had slight elevation in Thoracic Impedance Monitoring.  Patient denied any symptoms but did report he forgot to take his medicines yesterday.  Education given to call for any HF symptoms.   No changes made today.   [name], RN, CCM 09/29/2014 3:22 PM

## 2014-10-30 ENCOUNTER — Encounter: Payer: Self-pay | Admitting: Internal Medicine

## 2014-10-30 ENCOUNTER — Ambulatory Visit (INDEPENDENT_AMBULATORY_CARE_PROVIDER_SITE_OTHER): Payer: Medicare Other | Admitting: *Deleted

## 2014-10-30 DIAGNOSIS — I429 Cardiomyopathy, unspecified: Secondary | ICD-10-CM

## 2014-10-30 DIAGNOSIS — I5022 Chronic systolic (congestive) heart failure: Secondary | ICD-10-CM

## 2014-10-30 DIAGNOSIS — I428 Other cardiomyopathies: Secondary | ICD-10-CM

## 2014-10-30 DIAGNOSIS — Z95810 Presence of automatic (implantable) cardiac defibrillator: Secondary | ICD-10-CM | POA: Diagnosis not present

## 2014-10-30 NOTE — Progress Notes (Signed)
Remote ICD transmission.   

## 2014-10-31 NOTE — Progress Notes (Signed)
EPIC Encounter for ICM Monitoring  Patient Name: Timothy Fritz. is a 74 y.o. male Date: 10/31/2014 Primary Care Physican: [name], MD Primary Cardiologist: Domenic Polite Electrophysiologist: Allred Dry Weight: 196 lbs       In the past month, have you:  1. Gained more than 2 pounds in a day or more than 5 pounds in a week? no  2. Had changes in your medications (with verification of current medications)? no  3. Had more shortness of breath than is usual for you? no  4. Limited your activity because of shortness of breath? no  5. Not been able to sleep because of shortness of breath? no  6. Had increased swelling in your feet or ankles? no  7. Had symptoms of dehydration (dizziness, dry mouth, increased thirst, decreased urine output) no  8. Had changes in sodium restriction? no  9. Been compliant with medication? Yes   ICM trend:   Follow-up plan: ICM clinic phone appointment 12/04/2014.  Patient reported he is feeling good at this time.  He denied having any HF symptoms at this time.  His weight has dropped by 2 pounds.  No changes today.  Education given on HF symptoms to report.   Copy of note sent to patient's primary care physician, primary cardiologist, and device following physician.  [name], RN, CCM 10/31/2014 3:39 PM

## 2014-11-10 LAB — CUP PACEART REMOTE DEVICE CHECK
Date Time Interrogation Session: 20160915081257
HighPow Impedance: 66 Ohm
HighPow Impedance: 66 Ohm
Lead Channel Pacing Threshold Amplitude: 1.125 V
Lead Channel Pacing Threshold Amplitude: 1.5 V
Lead Channel Pacing Threshold Pulse Width: 0.5 ms
Lead Channel Pacing Threshold Pulse Width: 0.5 ms
Lead Channel Setting Pacing Amplitude: 2.125
Lead Channel Setting Pacing Amplitude: 2.5 V
Lead Channel Setting Pacing Pulse Width: 0.5 ms
Lead Channel Setting Sensing Sensitivity: 0.5 mV
MDC IDC MSMT BATTERY REMAINING LONGEVITY: 55 mo
MDC IDC MSMT BATTERY REMAINING PERCENTAGE: 66 %
MDC IDC MSMT BATTERY VOLTAGE: 2.96 V
MDC IDC MSMT LEADCHNL LV IMPEDANCE VALUE: 690 Ohm
MDC IDC MSMT LEADCHNL RA IMPEDANCE VALUE: 350 Ohm
MDC IDC MSMT LEADCHNL RA SENSING INTR AMPL: 1 mV
MDC IDC MSMT LEADCHNL RV IMPEDANCE VALUE: 400 Ohm
MDC IDC MSMT LEADCHNL RV SENSING INTR AMPL: 12 mV
MDC IDC SET LEADCHNL LV PACING PULSEWIDTH: 0.5 ms
MDC IDC SET ZONE DETECTION INTERVAL: 250 ms
MDC IDC SET ZONE DETECTION INTERVAL: 300 ms
MDC IDC SET ZONE DETECTION INTERVAL: 330 ms
Pulse Gen Serial Number: 7097390

## 2014-11-25 ENCOUNTER — Encounter: Payer: Self-pay | Admitting: Cardiology

## 2014-12-04 ENCOUNTER — Ambulatory Visit (INDEPENDENT_AMBULATORY_CARE_PROVIDER_SITE_OTHER): Payer: Medicare Other

## 2014-12-04 DIAGNOSIS — Z95810 Presence of automatic (implantable) cardiac defibrillator: Secondary | ICD-10-CM | POA: Diagnosis not present

## 2014-12-04 DIAGNOSIS — I5022 Chronic systolic (congestive) heart failure: Secondary | ICD-10-CM | POA: Diagnosis not present

## 2014-12-04 NOTE — Progress Notes (Signed)
EPIC Encounter for ICM Monitoring  Patient Name: Timothy Fritz. is a 74 y.o. male Date: 12/04/2014 Primary Care Physican: [name], MD Primary Cardiologist: Domenic Polite Electrophysiologist: Allred Dry Weight: 198 lbs       In the past month, have you:  1. Gained more than 2 pounds in a day or more than 5 pounds in a week? no  2. Had changes in your medications (with verification of current medications)? no  3. Had more shortness of breath than is usual for you? no  4. Limited your activity because of shortness of breath? no  5. Not been able to sleep because of shortness of breath? no  6. Had increased swelling in your feet or ankles? no  7. Had symptoms of dehydration (dizziness, dry mouth, increased thirst, decreased urine output) no  8. Had changes in sodium restriction? no  9. Been compliant with medication? Yes   ICM trend:   Follow-up plan: ICM clinic phone appointment on 01/05/2015.  Corvue impedance below baseline 12/02/2014 and 12/03/2014 with 90 day view showing trending back toward baseline 12/04/2014.  He denied any HF symptoms.  Corvue above baseline 11/26/2014 to 11/28/2014 and reviewed symptoms of dehydration.  He declined any symptoms.  Education given to drink moderate amount fluids, approximately 64oz daily and to review labels for amount of sodium he is eating per day.  No changes today.   Copy of note sent to patient's primary care physician, primary cardiologist, and device following physician.  Timothy Billings, RN, CCM 12/04/2014 10:58 AM

## 2015-01-05 ENCOUNTER — Ambulatory Visit (INDEPENDENT_AMBULATORY_CARE_PROVIDER_SITE_OTHER): Payer: Medicare Other

## 2015-01-05 DIAGNOSIS — Z95810 Presence of automatic (implantable) cardiac defibrillator: Secondary | ICD-10-CM

## 2015-01-05 DIAGNOSIS — I5022 Chronic systolic (congestive) heart failure: Secondary | ICD-10-CM

## 2015-01-06 NOTE — Progress Notes (Addendum)
EPIC Encounter for ICM Monitoring  Patient Name: Timothy Fritz. is a 74 y.o. male Date: 01/06/2015 Primary Care Physican: [name], MD Primary Cardiologist: Domenic Polite Electrophysiologist: Allred Dry Weight: 196 lb       In the past month, have you:  1. Gained more than 2 pounds in a day or more than 5 pounds in a week? Yes, a week ago  2. Had changes in your medications (with verification of current medications)? no  3. Had more shortness of breath than is usual for you? no  4. Limited your activity because of shortness of breath? no  5. Not been able to sleep because of shortness of breath? no  6. Had increased swelling in your feet or ankles? no  7. Had symptoms of dehydration (dizziness, dry mouth, increased thirst, decreased urine output) no  8. Had changes in sodium restriction? no  9. Been compliant with medication? Yes   ICM trend:   Follow-up plan: ICM clinic phone appointment on 02/10/2015.  Corvue daily impedance trending below baseline 12/28/2014 to 12/31/2014 and he reported he had weight gain during that time and could tell he was retaining fluid. He stated he took extra Furosemide as directed by Dr Domenic Polite and lost 2 pounds.  Advised if taking extra Lasix does not resolve HF symptoms in the future, to call the office.   Daily impedance trending above baseline after taking extra Furosemide 01/02/2015 to 01/05/2015.  Encouraged him to drink moderate amount of fluids even when taking extra dosages of Furosemide.  Recommendation for fluid intake is 64 oz and limit salt intake to 2000 mg daily.  He stated he feels fine now.  He confirmed he received the letter regarding the device battery.  No changes today.   Copy of note sent to patient's primary care physician, primary cardiologist, and device following physician.  [name], RN, CCM 01/06/2015 1:56 PM

## 2015-02-02 ENCOUNTER — Telehealth: Payer: Self-pay | Admitting: Cardiology

## 2015-02-02 ENCOUNTER — Ambulatory Visit (INDEPENDENT_AMBULATORY_CARE_PROVIDER_SITE_OTHER): Payer: Medicare Other | Admitting: *Deleted

## 2015-02-02 DIAGNOSIS — I5022 Chronic systolic (congestive) heart failure: Secondary | ICD-10-CM

## 2015-02-02 DIAGNOSIS — I428 Other cardiomyopathies: Secondary | ICD-10-CM

## 2015-02-02 DIAGNOSIS — I429 Cardiomyopathy, unspecified: Secondary | ICD-10-CM | POA: Diagnosis not present

## 2015-02-02 DIAGNOSIS — Z95810 Presence of automatic (implantable) cardiac defibrillator: Secondary | ICD-10-CM

## 2015-02-02 NOTE — Telephone Encounter (Signed)
Spoke with pt and reminded pt of remote transmission that is due today. Pt verbalized understanding.   

## 2015-02-03 NOTE — Progress Notes (Signed)
Remote ICD transmission.   

## 2015-02-03 NOTE — Addendum Note (Signed)
Addended by: [name] on: 02/03/2015 02:51 PM   Modules accepted: Level of Service

## 2015-02-03 NOTE — Progress Notes (Signed)
EPIC Encounter for ICM Monitoring  Patient Name: Timothy Fritz. is a 74 y.o. male Date: 02/03/2015 Primary Care Physican: [name], MD Primary Cardiologist: Domenic Polite Electrophysiologist: Allred Dry Weight: 198 lb       In the past month, have you:  1. Gained more than 2 pounds in a day or more than 5 pounds in a week? no  2. Had changes in your medications (with verification of current medications)? no  3. Had more shortness of breath than is usual for you? no  4. Limited your activity because of shortness of breath? no  5. Not been able to sleep because of shortness of breath? no  6. Had increased swelling in your feet or ankles? no  7. Had symptoms of dehydration (dizziness, dry mouth, increased thirst, decreased urine output) no  8. Had changes in sodium restriction? no  9. Been compliant with medication? Yes   ICM trend:   Follow-up plan: ICM clinic phone appointment on 03/10/2015.  He has appointment with Dr Domenic Polite on 02/24/2015.  Corvue daily impedance below baseline 01/14/2015 to 01/19/2015 suggesting fluid retention.  He reported some shortness of breath and it was alleviated with taking Furosemide. He reported he is feeling fine now and denied any symptoms.    Copy of note sent to patient's primary care physician, primary cardiologist, and device following physician.  [name], RN, CCM 02/03/2015 2:45 PM

## 2015-02-05 LAB — CUP PACEART REMOTE DEVICE CHECK
Battery Remaining Percentage: 62 %
Battery Voltage: 2.96 V
Date Time Interrogation Session: 20161219172155
HIGH POWER IMPEDANCE MEASURED VALUE: 65 Ohm
HighPow Impedance: 65 Ohm
Implantable Lead Implant Date: 20140529
Implantable Lead Implant Date: 20140529
Lead Channel Impedance Value: 400 Ohm
Lead Channel Impedance Value: 740 Ohm
Lead Channel Pacing Threshold Pulse Width: 0.5 ms
Lead Channel Sensing Intrinsic Amplitude: 11.7 mV
Lead Channel Setting Pacing Amplitude: 2 V
Lead Channel Setting Pacing Pulse Width: 0.5 ms
Lead Channel Setting Sensing Sensitivity: 0.5 mV
MDC IDC LEAD IMPLANT DT: 20140529
MDC IDC LEAD LOCATION: 753858
MDC IDC LEAD LOCATION: 753859
MDC IDC LEAD LOCATION: 753860
MDC IDC MSMT BATTERY REMAINING LONGEVITY: 53 mo
MDC IDC MSMT LEADCHNL LV PACING THRESHOLD AMPLITUDE: 0.875 V
MDC IDC MSMT LEADCHNL LV PACING THRESHOLD PULSEWIDTH: 0.5 ms
MDC IDC MSMT LEADCHNL RA IMPEDANCE VALUE: 350 Ohm
MDC IDC MSMT LEADCHNL RV PACING THRESHOLD AMPLITUDE: 1.125 V
MDC IDC SET LEADCHNL RV PACING AMPLITUDE: 2.125
MDC IDC SET LEADCHNL RV PACING PULSEWIDTH: 0.5 ms
Pulse Gen Serial Number: 7097390

## 2015-02-06 ENCOUNTER — Encounter: Payer: Self-pay | Admitting: Cardiology

## 2015-02-24 ENCOUNTER — Encounter: Payer: Self-pay | Admitting: Cardiology

## 2015-02-24 ENCOUNTER — Ambulatory Visit (INDEPENDENT_AMBULATORY_CARE_PROVIDER_SITE_OTHER): Payer: Medicare Other | Admitting: Cardiology

## 2015-02-24 VITALS — BP 138/82 | HR 80 | Ht 68.0 in | Wt 200.0 lb

## 2015-02-24 DIAGNOSIS — I429 Cardiomyopathy, unspecified: Secondary | ICD-10-CM

## 2015-02-24 DIAGNOSIS — Z95810 Presence of automatic (implantable) cardiac defibrillator: Secondary | ICD-10-CM | POA: Diagnosis not present

## 2015-02-24 DIAGNOSIS — I10 Essential (primary) hypertension: Secondary | ICD-10-CM | POA: Diagnosis not present

## 2015-02-24 DIAGNOSIS — Z954 Presence of other heart-valve replacement: Secondary | ICD-10-CM | POA: Diagnosis not present

## 2015-02-24 DIAGNOSIS — I428 Other cardiomyopathies: Secondary | ICD-10-CM

## 2015-02-24 NOTE — Patient Instructions (Signed)
Your physician recommends that you continue on your current medications as directed. Please refer to the Current Medication list given to you today. Your physician recommends that you schedule a follow-up appointment in: 6 months. You will receive a reminder letter in the mail in about months reminding you to call and schedule your appointment. If you don't receive this letter, please contact our office. 

## 2015-02-24 NOTE — Progress Notes (Signed)
Cardiology Office Note  Date: 02/24/2015   ID: Timothy Fowler Sr., DOB 1940/11/27, MRN [id number]  PCP: [name], MD  Primary Cardiologist: Rozann Lesches, MD   Chief Complaint  Patient presents with  . Cardiomyopathy  . Status post AVR    History of Present Illness: Timothy Fritz. is a 75 y.o. male last seen in July 2016. He presents for a routine follow-up visit. He reports no overall change in functional status, no exertional chest pain, palpitations, or syncope. He has NYHA class II dyspnea.  He continues to follow in the device clinic with Dr. Rayann Heman. No device shocks reported.  We reviewed his medications. He has had no changes from a cardiac perspective, outlined below. Reports no bleeding problems with Coumadin and continues to follow in the anticoagulation clinic.  Past Medical History  Diagnosis Date  . Coronary atherosclerosis of native coronary artery     Nonobstructive at cardiac catherization 2002  . Mixed hyperlipidemia   . Essential hypertension, benign   . BPH (benign prostatic hypertrophy)   . Nonischemic cardiomyopathy (HCC)     CRT-D  . Aortic valve, bicuspid     Mechanical AVR  . Aortic aneurysm, thoracic ([place])     Fusiform s/p Bentall procedure 2002  . Arthritis   . Basal cell carcinoma of face   . Persistent atrial fibrillation ([place])   . Left bundle branch block     Past Surgical History  Procedure Laterality Date  . Bentall procedure      Dr Cyndia Bent 2002  . Bi-ventricular implantable cardioverter defibrillator  (crt-d)  07/11/2012    [facility]- Dr. Rayann Heman  . Tonsillectomy and adenoidectomy  1949  . Cardiac valve replacement    . Cataract extraction Left 01/21/13  . Bi-ventricular implantable cardioverter defibrillator N/A 07/12/2012    Procedure: BI-VENTRICULAR IMPLANTABLE CARDIOVERTER DEFIBRILLATOR  (CRT-D);  Surgeon: [name], MD;  Location: [facility] CATH LAB;  Service: Cardiovascular;  Laterality: N/A;     Current Outpatient Prescriptions  Medication Sig Dispense Refill  . atorvastatin (LIPITOR) 20 MG tablet Take 1 tablet (20 mg total) by mouth every evening. 90 tablet 3  . furosemide (LASIX) 20 MG tablet Take 1 tablet (20 mg total) by mouth daily. 90 tablet 3  . losartan (COZAAR) 50 MG tablet Take 1 tablet (50 mg total) by mouth daily. 90 tablet 3  . MECLIZINE HCL PO Take by mouth as needed.    . metoprolol succinate (TOPROL-XL) 100 MG 24 hr tablet TAKE 1&1/2 TABLETS BY MOUTH TWICE DAILY 270 tablet 3  . Multiple Vitamin (MULTIVITAMIN WITH MINERALS) TABS Take 1 tablet by mouth daily.    . Omega-3 Fatty Acids (FISH OIL) 1000 MG CAPS Take 1,000 mg by mouth daily.     . Tamsulosin HCl (FLOMAX) 0.4 MG CAPS Take 0.4 mg by mouth daily after supper.     . warfarin (COUMADIN) 5 MG tablet Take 2.5-5 mg by mouth every evening. 1 tablet (5 mg) on Sunday, Tuesday and Thursday, 1/2 tablet (2.5 mg) on all other days MANAGED BY PMD     No current facility-administered medications for this visit.   Allergies:  Review of patient's allergies indicates no known allergies.   Social History: The patient  reports that he quit smoking about 15 years ago. His smoking use included Cigarettes. He has a 41 pack-year smoking history. He has never used smokeless tobacco. He reports that he drinks about 1.2 oz of alcohol per week. He  reports that he does not use illicit drugs.   ROS:  Please see the history of present illness. Otherwise, complete review of systems is positive for none.  All other systems are reviewed and negative.   Physical Exam: VS:  BP 138/82 mmHg  Pulse 80  Ht 5\' 8"  (1.727 m)  Wt 200 lb (90.719 kg)  BMI 30.42 kg/m2  SpO2 97%, BMI Body mass index is 30.42 kg/(m^2).  Wt Readings from Last 3 Encounters:  02/24/15 200 lb (90.719 kg)  08/25/14 202 lb (91.627 kg)  07/25/14 200 lb (90.719 kg)    Appears comfortable at rest.  HEENT: Conjunctiva and lids normal, oropharynx clear.  Neck:  Supple, no carotid bruits, no thyromegaly.  Lungs: Clear to auscultation, nonlabored.  Cardiac: Regular, crisp prosthetic mechanical aortic valve click, no diastolic murmur, no S3 gallop.  Abdomen: Soft, nontender, no hepatomegaly, no bruits.  Extremities: No significant pitting edema, few small varicosities. distal pulses are 2+.  ECG: ECG is ordered today and shows a ventricular paced rhythm at 84 bpm with intermittent intrinsic conduction.  Other Studies Reviewed Today:  Echocardiogram from March 2015 showed improvement in LVEF to the range of 40-45%, stable aortic prosthesis with reportedly moderate regurgitation (I reviewed the images and there is trace to mild aortic regurgitation), mild mitral regurgitation, severe left atrial enlargement, mildly dilated right ventricle, moderate to severe right atrial enlargement, mild to moderate tricuspid regurgitation.  Assessment and Plan:  1. Nonischemic cardiomyopathy with LVEF 40-45% by most recent assessment. He remains clinically stable on current medical regimen. No significant change in weight.  2. Patient status post St. Jude CRT-D, followed by Dr. Rayann Heman.  3. Essential hypertension, no changes made to current regimen.  4. Status post Bentall procedure with mechanical AVR in place. He continues on Coumadin.  Current medicines were reviewed with the patient today.   Orders Placed This Encounter  Procedures  . EKG 12-Lead    Disposition: FU with m3 in 6 months.   Signed, Satira Sark, MD, [facility] 02/24/2015 11:35 AM    [facility], [place], [place] [address] Phone: [phone]; Fax: [phone]

## 2015-03-10 ENCOUNTER — Ambulatory Visit (INDEPENDENT_AMBULATORY_CARE_PROVIDER_SITE_OTHER): Payer: Medicare Other

## 2015-03-10 DIAGNOSIS — Z95810 Presence of automatic (implantable) cardiac defibrillator: Secondary | ICD-10-CM

## 2015-03-10 DIAGNOSIS — I5022 Chronic systolic (congestive) heart failure: Secondary | ICD-10-CM

## 2015-03-11 NOTE — Progress Notes (Signed)
EPIC Encounter for ICM Monitoring  Patient Name: Timothy Fritz. is a 75 y.o. male Date: 03/11/2015 Primary Care Physican: [name], MD Primary Cardiologist: Aundra Dubin Electrophysiologist: Allred Dry Weight: 198 lb       In the past month, have you:  1. Gained more than 2 pounds in a day or more than 5 pounds in a week? He did have 4-5 pound weight gain about 2 weeks ago but resolved after taking Furosemide.   2. Had changes in your medications (with verification of current medications)? no  3. Had more shortness of breath than is usual for you? no  4. Limited your activity because of shortness of breath? no  5. Not been able to sleep because of shortness of breath? no  6. Had increased swelling in your feet or ankles? no  7. Had symptoms of dehydration (dizziness, dry mouth, increased thirst, decreased urine output) no  8. Had changes in sodium restriction? no  9. Been compliant with medication? Yes   ICM trend: 3 month view for 03/10/2015   ICM trend: 1 year view for 03/10/2015    Follow-up plan: ICM clinic phone appointment 04/13/2015.  Corvue thoracic impedance below reference line during holidays and 02/27/2015 to 03/01/2015 suggesting fluid retention.  He reported eating differently during those times and he did have some shortness of breath and weight gain but has been resolved with Furosemide.  He stated he has discussed with Dr Aundra Dubin that he takes the Furosemide about 4 times a week instead of daily as prescribed.  He denied any current symptoms. No changes today.   Copy of note sent to patient's primary care physician, primary cardiologist, and device following physician.  [name], RN, CCM 03/11/2015 10:29 AM

## 2015-04-13 ENCOUNTER — Ambulatory Visit (INDEPENDENT_AMBULATORY_CARE_PROVIDER_SITE_OTHER): Payer: Medicare Other

## 2015-04-13 DIAGNOSIS — Z95810 Presence of automatic (implantable) cardiac defibrillator: Secondary | ICD-10-CM | POA: Diagnosis not present

## 2015-04-13 DIAGNOSIS — I5022 Chronic systolic (congestive) heart failure: Secondary | ICD-10-CM

## 2015-04-16 ENCOUNTER — Telehealth: Payer: Self-pay

## 2015-04-16 NOTE — Telephone Encounter (Signed)
Remote ICM transmission received.  Attempted patient call and left message for return call.   

## 2015-04-24 NOTE — Progress Notes (Signed)
EPIC Encounter for ICM Monitoring  Patient Name: Timothy Fritz. is a 75 y.o. male Date:  04/24/2015 Primary Care Physican: [name], MD Primary Cardiologist: Claiborne Billings Electrophysiologist: Allred Dry Weight: 198 lbs   Bi-V Pacing 85%      In the past month, have you:  1. Gained more than 2 pounds in a day or more than 5 pounds in a week? no  2. Had changes in your medications (with verification of current medications)? no  3. Had more shortness of breath than is usual for you? no  4. Limited your activity because of shortness of breath? no  5. Not been able to sleep because of shortness of breath? no  6. Had increased swelling in your feet or ankles? no  7. Had symptoms of dehydration (dizziness, dry mouth, increased thirst, decreased urine output) no  8. Had changes in sodium restriction? no  9. Been compliant with medication? Yes   ICM trend: 3 month view for 04/22/2015   ICM trend: 1 year view for 04/22/2015   Follow-up plan: ICM clinic phone appointment 05/04/2015 and 05/26/2015.  Thoracic impedance below reference line from 04/17/2015 to 04/22/2015 suggesting fluid accumulation.   Patient does not have any symptoms.   Patient reported he takes Furosemide prn instead of daily.  Recommended he take Furosemide 20 mg one tablet x 3 days.   Encouraged to call for any fluid symptoms.    Copy of note sent to patient's primary care physician, primary cardiologist, and device following physician.  [name], RN, CCM 04/24/2015 1:55 PM

## 2015-04-29 NOTE — Telephone Encounter (Signed)
Spoke with patient.

## 2015-05-04 ENCOUNTER — Other Ambulatory Visit: Payer: Self-pay | Admitting: Cardiology

## 2015-05-04 ENCOUNTER — Ambulatory Visit (INDEPENDENT_AMBULATORY_CARE_PROVIDER_SITE_OTHER): Payer: Medicare Other | Admitting: *Deleted

## 2015-05-04 DIAGNOSIS — I5022 Chronic systolic (congestive) heart failure: Secondary | ICD-10-CM

## 2015-05-04 DIAGNOSIS — Z95810 Presence of automatic (implantable) cardiac defibrillator: Secondary | ICD-10-CM | POA: Diagnosis not present

## 2015-05-04 DIAGNOSIS — I429 Cardiomyopathy, unspecified: Secondary | ICD-10-CM | POA: Diagnosis not present

## 2015-05-04 DIAGNOSIS — I428 Other cardiomyopathies: Secondary | ICD-10-CM

## 2015-05-04 NOTE — Progress Notes (Signed)
EPIC Encounter for ICM Monitoring  Patient Name: Timothy Fritz. is a 75 y.o. male Date: 05/04/2015 Primary Care Physican: [name], MD Primary Cardiologist: Claiborne Billings Electrophysiologist: Allred Dry Weight: unknown   Bi-V Pacing 85%      In the past month, have you:  1. Gained more than 2 pounds in a day or more than 5 pounds in a week? N/A  2. Had changes in your medications (with verification of current medications)? N/A  3. Had more shortness of breath than is usual for you? N/A  4. Limited your activity because of shortness of breath? N/A  5. Not been able to sleep because of shortness of breath? N/A  6. Had increased swelling in your feet or ankles? N/A  7. Had symptoms of dehydration (dizziness, dry mouth, increased thirst, decreased urine output) N/A  8. Had changes in sodium restriction? N/A  9. Been compliant with medication? N/A   ICM trend: 3 month view for 05/04/2015   ICM trend: 1 year view for 05/04/2015   Follow-up plan: ICM clinic phone appointment on  05/26/2015.   Transmission reviewed.  Thoracic impedance above reference line from 04/24/2015 to 04/26/2015 and 04/29/2015 to 05/02/2015 suggesting dryness which correlates with patient taking Furosemide 20 mg x 3 days on 04/24/2015.    [name], RN, CCM 05/04/2015 3:59 PM

## 2015-05-05 NOTE — Progress Notes (Signed)
Remote ICD transmission.   

## 2015-05-26 ENCOUNTER — Ambulatory Visit (INDEPENDENT_AMBULATORY_CARE_PROVIDER_SITE_OTHER): Payer: Medicare Other

## 2015-05-26 DIAGNOSIS — Z95810 Presence of automatic (implantable) cardiac defibrillator: Secondary | ICD-10-CM

## 2015-05-26 DIAGNOSIS — I5022 Chronic systolic (congestive) heart failure: Secondary | ICD-10-CM | POA: Diagnosis not present

## 2015-05-26 NOTE — Progress Notes (Signed)
EPIC Encounter for ICM Monitoring  Patient Name: Timothy Fritz. is a 75 y.o. male Date: 05/26/2015 Primary Care Physican: [name], MD Primary Cardiologist: Domenic Polite Electrophysiologist: Allred Dry Weight: unknown   Bi-V Pacing >99%      In the past month, have you:  1. Gained more than 2 pounds in a day or more than 5 pounds in a week? no  2. Had changes in your medications (with verification of current medications)? no  3. Had more shortness of breath than is usual for you? Yes, during 05/15/2015 to 05/21/2015 but now resolved  4. Limited your activity because of shortness of breath? no  5. Not been able to sleep because of shortness of breath? no  6. Had increased swelling in your feet or ankles? no  7. Had symptoms of dehydration (dizziness, dry mouth, increased thirst, decreased urine output) no  8. Had changes in sodium restriction? no  9. Been compliant with medication? Yes   ICM trend: 3 month view for 05/26/2015   ICM trend: 1 year view for 05/26/2015   Follow-up plan: ICM clinic phone appointment on 06/30/2015.  Thoracic impedance below reference line from 05/15/2015 to 05/16/2015 suggesting fluid accumulation and returned to reference line 05/21/2015.  Patient had symptoms of SOB during that time and took extra Furosemide which correlates with transmission.  Education given to limit sodium intake to < 2000 mg and fluid intake to 64 oz daily.  Encouraged to call for any fluid symptoms.  No changes today.     [name], RN, CCM 05/26/2015 3:36 PM

## 2015-06-11 LAB — CUP PACEART REMOTE DEVICE CHECK
HighPow Impedance: 63 Ohm
HighPow Impedance: 63 Ohm
Implantable Lead Implant Date: 20140529
Implantable Lead Implant Date: 20140529
Implantable Lead Location: 753858
Implantable Lead Location: 753860
Lead Channel Impedance Value: 350 Ohm
Lead Channel Impedance Value: 710 Ohm
Lead Channel Pacing Threshold Amplitude: 1 V
Lead Channel Pacing Threshold Amplitude: 1 V
Lead Channel Pacing Threshold Pulse Width: 0.5 ms
Lead Channel Pacing Threshold Pulse Width: 0.5 ms
Lead Channel Setting Pacing Pulse Width: 0.5 ms
Lead Channel Setting Sensing Sensitivity: 0.5 mV
MDC IDC LEAD IMPLANT DT: 20140529
MDC IDC LEAD LOCATION: 753859
MDC IDC MSMT BATTERY REMAINING LONGEVITY: 50 mo
MDC IDC MSMT BATTERY REMAINING PERCENTAGE: 59 %
MDC IDC MSMT BATTERY VOLTAGE: 2.95 V
MDC IDC MSMT LEADCHNL RV IMPEDANCE VALUE: 400 Ohm
MDC IDC MSMT LEADCHNL RV SENSING INTR AMPL: 11.7 mV
MDC IDC PG SERIAL: 7097390
MDC IDC SESS DTM: 20170320060018
MDC IDC SET LEADCHNL LV PACING AMPLITUDE: 2 V
MDC IDC SET LEADCHNL LV PACING PULSEWIDTH: 0.5 ms
MDC IDC SET LEADCHNL RV PACING AMPLITUDE: 2 V

## 2015-06-12 ENCOUNTER — Encounter: Payer: Self-pay | Admitting: Cardiology

## 2015-06-26 ENCOUNTER — Encounter: Payer: Self-pay | Admitting: Cardiology

## 2015-06-30 ENCOUNTER — Ambulatory Visit (INDEPENDENT_AMBULATORY_CARE_PROVIDER_SITE_OTHER): Payer: Medicare Other

## 2015-06-30 DIAGNOSIS — I5022 Chronic systolic (congestive) heart failure: Secondary | ICD-10-CM | POA: Diagnosis not present

## 2015-06-30 DIAGNOSIS — Z95810 Presence of automatic (implantable) cardiac defibrillator: Secondary | ICD-10-CM

## 2015-07-01 ENCOUNTER — Telehealth: Payer: Self-pay

## 2015-07-01 NOTE — Progress Notes (Signed)
EPIC Encounter for ICM Monitoring  Patient Name: Timothy Fritz. is a 75 y.o. male Date: 07/01/2015 Primary Care Physican: [name], MD Primary Cardiologist: Domenic Polite Electrophysiologist: Allred Dry Weight: unknown   Bi-V Pacing 86%  AT/AF Burden >99%      In the past month, have you:  1. Gained more than 2 pounds in a day or more than 5 pounds in a week? No  2. Had changes in your medications (with verification of current medications)? No  3. Had more shortness of breath than is usual for you? No  4. Limited your activity because of shortness of breath? No  5. Not been able to sleep because of shortness of breath? No  6. Had increased swelling in your feet or ankles? No  7. Had symptoms of dehydration (dizziness, dry mouth, increased thirst, decreased urine output) No  8. Had changes in sodium restriction? No  9. Been compliant with medication? No, he has been skipping Furosemide dosages due to he felt like he did not need it since he was not having symptoms.  Advised that by taking the medication that it helps manage the fluid so he does not have symptoms.     ICM trend: 3 month view for 06/30/2015   ICM trend: 1 year view for 06/30/2015   Follow-up plan: ICM clinic phone appointment on 07/03/2015.   Transmission reviewed.  Spoke with patient.    FLUID: Thoracic impedance below reference line from 06/21/2015 to 06/24/2015, 06/27/2015 to 06/30/2015 suggesting fluid accumulation due to missing Furosemide dosages.      SYMPTOMS: None.  He denied any symptoms.    Recommended to increase Furosemide 20 mg bid x 2 days as patient has been skiping Furosemide dosages.  Repeat transmission on 07/03/2015.  Copy of note sent to patient's primary care physician, Domenic Polite, and Dr Rayann Heman for review and any further recommendations.    [name], RN, CCM 07/01/2015 1:41 PM

## 2015-07-01 NOTE — Telephone Encounter (Signed)
Remote ICM transmission received.  Attempted patient call and left message for return call.   

## 2015-07-03 ENCOUNTER — Telehealth: Payer: Self-pay | Admitting: Cardiology

## 2015-07-03 ENCOUNTER — Ambulatory Visit (INDEPENDENT_AMBULATORY_CARE_PROVIDER_SITE_OTHER): Payer: Medicare Other

## 2015-07-03 DIAGNOSIS — Z95810 Presence of automatic (implantable) cardiac defibrillator: Secondary | ICD-10-CM | POA: Diagnosis not present

## 2015-07-03 DIAGNOSIS — I5022 Chronic systolic (congestive) heart failure: Secondary | ICD-10-CM

## 2015-07-03 NOTE — Progress Notes (Signed)
EPIC Encounter for ICM Monitoring  Patient Name: Elrey Capozza. is a 75 y.o. male Date: 07/03/2015 Primary Care Physican: [name], MD Primary Cardiologist: Domenic Polite Electrophysiologist: Allred Dry Weight: Does not weigh  Bi-V Pacing 86% AT/AF >99%     In the past month, have you:  1. Gained more than 2 pounds in a day or more than 5 pounds in a week? unknown  2. Had changes in your medications (with verification of current medications)? no  3. Had more shortness of breath than is usual for you? no  4. Limited your activity because of shortness of breath? no  5. Not been able to sleep because of shortness of breath? no  6. Had increased swelling in your feet, ankles, legs or stomach area? no  7. Had symptoms of dehydration (dizziness, dry mouth, increased thirst, decreased urine output) no  8. Had changes in sodium restriction? no  9. Been compliant with medication? Yes  ICM trend: 3 month view for 07/03/2015   ICM trend: 1 year view for 07/03/2015   Follow-up plan: ICM clinic phone appointment 08/20/2015 and office visit with Dr Domenic Polite.    FLUID LEVELS:  Since last ICM transmission 07/01/2015, Corvue thoracic impedance returned to baseline after taking Furosemide 20 bid for the last 2 days.    SYMPTOMS:   He reported ankle swelling has resolved and he is doing well at this time.  Encouraged to call for any fluid symptoms.   EDUCATION: Limit sodium intake to < 2000 mg and fluid intake to 64 oz daily.     RECOMMENDATIONS: No changes today.    [name], RN, CCM 07/03/2015 3:01 PM

## 2015-07-03 NOTE — Telephone Encounter (Signed)
LMOVM reminding pt to send remote transmission.   

## 2015-07-09 ENCOUNTER — Other Ambulatory Visit: Payer: Self-pay | Admitting: *Deleted

## 2015-07-09 DIAGNOSIS — I5022 Chronic systolic (congestive) heart failure: Secondary | ICD-10-CM

## 2015-07-17 ENCOUNTER — Ambulatory Visit: Payer: Medicare Other | Admitting: Internal Medicine

## 2015-07-21 ENCOUNTER — Encounter: Payer: Self-pay | Admitting: *Deleted

## 2015-07-22 ENCOUNTER — Encounter: Payer: Self-pay | Admitting: Internal Medicine

## 2015-07-22 ENCOUNTER — Ambulatory Visit (INDEPENDENT_AMBULATORY_CARE_PROVIDER_SITE_OTHER): Payer: Medicare Other | Admitting: Internal Medicine

## 2015-07-22 VITALS — BP 122/78 | HR 78 | Ht 68.0 in | Wt 204.4 lb

## 2015-07-22 DIAGNOSIS — I480 Paroxysmal atrial fibrillation: Secondary | ICD-10-CM | POA: Diagnosis not present

## 2015-07-22 DIAGNOSIS — I5022 Chronic systolic (congestive) heart failure: Secondary | ICD-10-CM | POA: Diagnosis not present

## 2015-07-22 DIAGNOSIS — Z95810 Presence of automatic (implantable) cardiac defibrillator: Secondary | ICD-10-CM

## 2015-07-22 LAB — CUP PACEART INCLINIC DEVICE CHECK
Brady Statistic RV Percent Paced: 86 %
Date Time Interrogation Session: 20170607114954
HIGH POWER IMPEDANCE MEASURED VALUE: 65.25 Ohm
Implantable Lead Implant Date: 20140529
Implantable Lead Implant Date: 20140529
Implantable Lead Location: 753858
Implantable Lead Location: 753859
Lead Channel Impedance Value: 362.5 Ohm
Lead Channel Impedance Value: 750 Ohm
Lead Channel Pacing Threshold Amplitude: 1 V
Lead Channel Pacing Threshold Amplitude: 1 V
Lead Channel Pacing Threshold Pulse Width: 0.5 ms
Lead Channel Pacing Threshold Pulse Width: 0.5 ms
Lead Channel Pacing Threshold Pulse Width: 0.5 ms
Lead Channel Sensing Intrinsic Amplitude: 1.6 mV
Lead Channel Sensing Intrinsic Amplitude: 11.7 mV
Lead Channel Setting Pacing Amplitude: 2 V
Lead Channel Setting Pacing Amplitude: 2 V
MDC IDC LEAD IMPLANT DT: 20140529
MDC IDC LEAD LOCATION: 753860
MDC IDC MSMT BATTERY REMAINING LONGEVITY: 48 mo
MDC IDC MSMT LEADCHNL LV PACING THRESHOLD AMPLITUDE: 1 V
MDC IDC MSMT LEADCHNL RV IMPEDANCE VALUE: 400 Ohm
MDC IDC MSMT LEADCHNL RV PACING THRESHOLD AMPLITUDE: 1 V
MDC IDC MSMT LEADCHNL RV PACING THRESHOLD PULSEWIDTH: 0.5 ms
MDC IDC SET LEADCHNL LV PACING PULSEWIDTH: 0.5 ms
MDC IDC SET LEADCHNL RV PACING PULSEWIDTH: 0.5 ms
MDC IDC SET LEADCHNL RV SENSING SENSITIVITY: 0.5 mV
MDC IDC STAT BRADY RA PERCENT PACED: 0 %
Pulse Gen Serial Number: 7097390

## 2015-07-22 NOTE — Patient Instructions (Signed)
Your physician recommends that you continue on your current medications as directed. Please refer to the Current Medication list given to you today. Device check on 10/21/15. Your physician recommends that you schedule a follow-up appointment in: 1 year with Dr. Rayann Heman. Please schedule this appointment today before leaving the office.

## 2015-07-22 NOTE — Progress Notes (Addendum)
PCP: Timothy Blitz, MD Primary Cardiologist:  [name] is a 75 y.o. male who presents today for routine electrophysiology followup.  Since his last visit, the patient reports doing very well.  He enjoys fishing and gardening and does not feel limited at this time.  He liked to pond fish for bass.  He is going less often than last year.  He uses minnows to catch bass. Today, he denies symptoms of palpitations, chest pain, shortness of breath,  lower extremity edema, dizziness, presyncope, syncope, or ICD shocks.  The patient is otherwise without complaint today.   Past Medical History  Diagnosis Date  . Coronary atherosclerosis of native coronary artery     Nonobstructive at cardiac catherization 2002  . Mixed hyperlipidemia   . Essential hypertension, benign   . BPH (benign prostatic hypertrophy)   . Nonischemic cardiomyopathy (HCC)     CRT-D  . Aortic valve, bicuspid     Mechanical AVR  . Aortic aneurysm, thoracic ([place])     Fusiform s/p Bentall procedure 2002  . Arthritis   . Basal cell carcinoma of face   . Persistent atrial fibrillation ([place])   . Left bundle branch block    Past Surgical History  Procedure Laterality Date  . Bentall procedure      Dr Cyndia Bent 2002  . Bi-ventricular implantable cardioverter defibrillator  (crt-d)  07/11/2012    [facility] Jude Medical Quadra Assura- Dr. Rayann Heman  . Tonsillectomy and adenoidectomy  1949  . Cardiac valve replacement    . Cataract extraction Left 01/21/13  . Bi-ventricular implantable cardioverter defibrillator N/A 07/12/2012    Procedure: BI-VENTRICULAR IMPLANTABLE CARDIOVERTER DEFIBRILLATOR  (CRT-D);  Surgeon: [name], MD;  Location: [facility] CATH LAB;  Service: Cardiovascular;  Laterality: N/A;    Current Outpatient Prescriptions  Medication Sig Dispense Refill  . atorvastatin (LIPITOR) 20 MG tablet Take 1 tablet (20 mg total) by mouth every evening. 90 tablet 3  . furosemide (LASIX) 20 MG tablet Take 1 tablet  (20 mg total) by mouth daily. 90 tablet 3  . losartan (COZAAR) 50 MG tablet Take 1 tablet (50 mg total) by mouth daily. 90 tablet 3  . MECLIZINE HCL PO Take by mouth as needed.    . metoprolol succinate (TOPROL-XL) 100 MG 24 hr tablet TAKE 1 AND 1/2 TABLETS BY MOUTH TWICE DAILY 270 tablet 3  . Multiple Vitamin (MULTIVITAMIN WITH MINERALS) TABS Take 1 tablet by mouth daily.    . Omega-3 Fatty Acids (FISH OIL) 1000 MG CAPS Take 1,000 mg by mouth daily.     . Tamsulosin HCl (FLOMAX) 0.4 MG CAPS Take 0.4 mg by mouth daily after supper.     . warfarin (COUMADIN) 5 MG tablet Take 2.5-5 mg by mouth every evening. 1 tablet (5 mg) on Sunday, Tuesday and Thursday, 1/2 tablet (2.5 mg) on all other days MANAGED BY PMD     No current facility-administered medications for this visit.   ROS- all systems are reviewed and negative except as per HPI above  Physical Exam: Filed Vitals:   07/22/15 1026  BP: 122/78  Pulse: 78  Height: 5\' 8"  (1.727 m)  Weight: 204 lb 6.4 oz (92.715 kg)  SpO2: 97%    GEN- The patient is well appearing, alert and oriented x 3 today.   Head- normocephalic, atraumatic Eyes-  Sclera clear, conjunctiva pink Ears- hearing intact Oropharynx- clear Lungs- Clear to ausculation bilaterally, normal work of breathing Chest- ICD pocket is well healed  Heart- Regular rate and rhythm (paced), mechanical S2 GI- soft, NT, ND, + BS Extremities- no clubbing, cyanosis, or edema  ICD interrogation- reviewed in detail today,  See PACEART report  Assessment and Plan:  1.  Chronic systolic dysfunction euvolemic today, [place] is followed by [name] Stable on an appropriate medical regimen Normal BiV ICD function See Pace Art report  I have discussed [facility] Fortify Assura advisary with the patient today. He understands that recommendation from [facility] is to not replace the device at this time. The patient is not device dependant.  The patient has not had appropriate device therapy in  the past or implanted for secondary prevention.  Vibratory alert demonstrated today.  He is actively remotely monitored and understands the importance of compliance today.    He is 86% BiV paced   We have discussed AV nodal ablation several times.  As he is doing so well, he declines.  He will reconsider with any clinical change.  2. Persistent afib Anticoagulated with coumadin for valvular afib  3. Mechanical AVR Continue coumadin long term  4. HTN Stable No change required today  [place] Follow-up with Dr Domenic Polite as scheduled I will see again in 1 year  [name] MD, [facility] 07/22/2015 11:05 AM

## 2015-08-20 ENCOUNTER — Ambulatory Visit (INDEPENDENT_AMBULATORY_CARE_PROVIDER_SITE_OTHER): Payer: Medicare Other

## 2015-08-20 DIAGNOSIS — Z95810 Presence of automatic (implantable) cardiac defibrillator: Secondary | ICD-10-CM

## 2015-08-20 DIAGNOSIS — I5022 Chronic systolic (congestive) heart failure: Secondary | ICD-10-CM | POA: Diagnosis not present

## 2015-08-20 NOTE — Progress Notes (Signed)
EPIC Encounter for ICM Monitoring  Patient Name: Timothy Fritz. is a 76 y.o. male Date: 08/20/2015 Primary Care Physican: [name], MD Primary Cardiologist: Domenic Polite Electrophysiologist: Allred Dry Weight: unknown Bi-V Pacing:  92% and AT/AF >99%       Heart Failure questions reviewed, pt asymptomatic  Thoracic impedence below reference line 08/08/2015 to 08/13/2015 which correlates with his vacation.  He was eating out more during that time.  Impedance slightly below reference line 08/19/2015 and 08/20/2015 suggesting the start of fluid accumulation and he thinks it is related to foods eaten on holiday, July 4th.   Low sodium diet education provided   ICM trend: 08/20/2015      Follow-up plan: ICM clinic phone appointment on 09/23/2015.  Office appointment with Dr Domenic Polite on 08/21/2015.   Copy of ICM check sent to primary cardiologist and device physician.   [name], RN 08/20/2015 1:06 PM

## 2015-08-21 ENCOUNTER — Ambulatory Visit: Payer: Medicare Other | Admitting: Cardiology

## 2015-09-23 ENCOUNTER — Ambulatory Visit (INDEPENDENT_AMBULATORY_CARE_PROVIDER_SITE_OTHER): Payer: Medicare Other

## 2015-09-23 DIAGNOSIS — I5022 Chronic systolic (congestive) heart failure: Secondary | ICD-10-CM

## 2015-09-23 DIAGNOSIS — Z95810 Presence of automatic (implantable) cardiac defibrillator: Secondary | ICD-10-CM

## 2015-09-23 NOTE — Progress Notes (Signed)
EPIC Encounter for ICM Monitoring  Patient Name: Timothy Fritz. is a 75 y.o. male Date: 09/23/2015 Primary Care Physican: [name], MD Primary Cardiologist: Domenic Polite Electrophysiologist: Allred Dry Weight: 200 lb  Bi-V Pacing:  91% and AT/AF >99%        Heart Failure questions reviewed, pt asymptomatic today.     Thoracic impedance normal with exception of 09/16/2015 to 09/20/2015 abnormal suggesting fluid accumulation which correlates with increase in SOB and feet swelling.   Recommendations: No changes.  Low sodium diet education provided.  Patient asked if he could take extra Furosemide when he has fluid symptoms. Advised to discuss with Dr Domenic Polite at appointment on 10/01/2015.   ICM trend: 09/23/2015     Follow-up plan: ICM clinic phone appointment on 10/27/2015.  Copy of ICM check sent to primary cardiologist and device physician.   [name], RN 09/23/2015 11:34 AM

## 2015-09-30 ENCOUNTER — Encounter: Payer: Self-pay | Admitting: *Deleted

## 2015-09-30 NOTE — Progress Notes (Signed)
Cardiology Office Note  Date: 10/01/2015   ID: Timothy Fowler Sr., DOB Jun 05, 1940, MRN [id number]  PCP: [name], MD  Primary Cardiologist: Rozann Lesches, MD   Chief Complaint  Patient presents with  . Cardiomyopathy  . Valvular heart disease    History of Present Illness: Timothy Fritz. is a 75 y.o. male last seen in January. He presents for a routine follow-up visit. Since last evaluation he does not report any significant chest discomfort or increasing shortness of breath. He is not aware of any palpitations.  He continues to follow with Dr. Rayann Heman in the device clinic, [place] CRT-D in place. I reviewed his recent thoracic impedance measurements. He remains comfortable using Lasix on an as-needed basis. We did discuss daily use if weight gain becomes a more persistent issue.  He is on Coumadin with follow-up in the anticoagulation clinic. No reported bleeding problems.  I reviewed his remaining cardiac medications as well, outlined below.  His last echocardiogram was in 2015.  Past Medical History:  Diagnosis Date  . Aortic aneurysm, thoracic ([place])    Fusiform s/p Bentall procedure 2002  . Aortic valve, bicuspid    Mechanical AVR  . Arthritis   . Basal cell carcinoma of face   . BPH (benign prostatic hypertrophy)   . Coronary atherosclerosis of native coronary artery    Nonobstructive at cardiac catherization 2002  . Essential hypertension, benign   . Left bundle branch block   . Mixed hyperlipidemia   . Nonischemic cardiomyopathy (HCC)    CRT-D  . Persistent atrial fibrillation [facility])     Past Surgical History:  Procedure Laterality Date  . BENTALL PROCEDURE     Dr Cyndia Bent 2002  . BI-VENTRICULAR IMPLANTABLE CARDIOVERTER DEFIBRILLATOR N/A 07/12/2012   Procedure: BI-VENTRICULAR IMPLANTABLE CARDIOVERTER DEFIBRILLATOR  (CRT-D);  Surgeon: [name], MD;  Location: [facility] CATH LAB;  Service: Cardiovascular;  Laterality: N/A;  . BI-VENTRICULAR IMPLANTABLE  CARDIOVERTER DEFIBRILLATOR  (CRT-D)  07/11/2012   [facility] Jude Medical Quadra Assura- Dr. Rayann Heman  . CARDIAC VALVE REPLACEMENT    . CATARACT EXTRACTION Left 01/21/13  . TONSILLECTOMY AND ADENOIDECTOMY  1949    Current Outpatient Prescriptions  Medication Sig Dispense Refill  . atorvastatin (LIPITOR) 20 MG tablet Take 1 tablet (20 mg total) by mouth every evening. 90 tablet 3  . furosemide (LASIX) 20 MG tablet Take 1 tablet (20 mg total) by mouth daily. 90 tablet 3  . losartan (COZAAR) 50 MG tablet Take 1 tablet (50 mg total) by mouth daily. 90 tablet 3  . MECLIZINE HCL PO Take by mouth as needed.    . metoprolol succinate (TOPROL-XL) 100 MG 24 hr tablet TAKE 1 AND 1/2 TABLETS BY MOUTH TWICE DAILY 270 tablet 3  . Multiple Vitamin (MULTIVITAMIN WITH MINERALS) TABS Take 1 tablet by mouth daily.    . Omega-3 Fatty Acids (FISH OIL) 1000 MG CAPS Take 1,000 mg by mouth daily.     . Tamsulosin HCl (FLOMAX) 0.4 MG CAPS Take 0.4 mg by mouth daily after supper.     . warfarin (COUMADIN) 5 MG tablet Take 2.5-5 mg by mouth every evening. 1 tablet (5 mg) on Sunday, Tuesday and Thursday, 1/2 tablet (2.5 mg) on all other days MANAGED BY PMD     No current facility-administered medications for this visit.    Allergies:  Review of patient's allergies indicates no known allergies.   Social History: The patient  reports that he quit smoking about 15 years ago. His  smoking use included Cigarettes. He has a 41.00 pack-year smoking history. He has never used smokeless tobacco. He reports that he drinks about 1.2 oz of alcohol per week . He reports that he does not use drugs.   ROS:  Please see the history of present illness. Otherwise, complete review of systems is positive for none.  All other systems are reviewed and negative.   Physical Exam: VS:  BP 134/85   Pulse 88   Ht 5\' 8"  (1.727 m)   Wt 204 lb 6.4 oz (92.7 kg)   BMI 31.08 kg/m , BMI Body mass index is 31.08 kg/m.  Wt Readings from Last 3 Encounters:   10/01/15 204 lb 6.4 oz (92.7 kg)  07/22/15 204 lb 6.4 oz (92.7 kg)  02/24/15 200 lb (90.7 kg)    Appears comfortable at rest.  HEENT: Conjunctiva and lids normal, oropharynx clear.  Neck: Supple, no carotid bruits, no thyromegaly.  Lungs: Clear to auscultation, nonlabored.  Cardiac: Regular, crisp prosthetic mechanical aortic valve click, no diastolic murmur, no S3 gallop.  Abdomen: Soft, nontender, no hepatomegaly, no bruits.  Extremities: No significant pitting edema, few small varicosities. distal pulses are 2+.  ECG: I personally reviewed the tracing from 02/24/2015 which showed a ventricular paced rhythm with intermittent intrinsic conduction.  Recent Labwork:  February 2017: BNP 133, hemoglobin A1c 5.7, hemoglobin 14.5, platelets 130, BUN 18, creatinine 1.1, potassium 4.3, SGOT 28, SGPT 26, cholesterol 129, triglycerides 155, HDL 41, LDL 57  Other Studies Reviewed Today:  Echocardiogram March 2015: LVEF to the range of 40-45%, stable aortic prosthesis with reportedly moderate regurgitation (I reviewed the images and there is trace to mild aortic regurgitation), mild mitral regurgitation, severe left atrial enlargement, mildly dilated right ventricle, moderate to severe right atrial enlargement, mild to moderate tricuspid regurgitation.  Assessment and Plan:  1. Nonischemic cardiomyopathy, LVEF 40-45% by last echocardiogram in 2015. He has been symptomatically stable with occasional fluctuations in weight. We will continue the current diuretic plan. Follow-up echocardiogram to be obtained as well.  2. History of aortic stenosis and aortic aneurysm status post Bentall procedure with mechanical AVR. He continues on Coumadin with follow-up in the anticoagulation clinic.  3. [employer] Jude biventricular ICD in place, followed by Dr. Rayann Heman. No device shocks. Biventricular pacing 86% of the time by last assessment. He has declined AV node ablation.  4. Paroxysmal atrial  fibrillation/flutter. Continue high-dose beta blocker and Coumadin.  Current medicines were reviewed with the patient today.   Orders Placed This Encounter  Procedures  . ECHOCARDIOGRAM COMPLETE    Disposition: Follow-up with me in 6 months.  Signed, Satira Sark, MD, [facility] 10/01/2015 10:19 AM    [facility] [address], [place], [place] [address] Phone: [phone]; Fax: [phone]

## 2015-10-01 ENCOUNTER — Ambulatory Visit (INDEPENDENT_AMBULATORY_CARE_PROVIDER_SITE_OTHER): Payer: Medicare Other | Admitting: Cardiology

## 2015-10-01 ENCOUNTER — Encounter: Payer: Self-pay | Admitting: Cardiology

## 2015-10-01 ENCOUNTER — Other Ambulatory Visit: Payer: Self-pay

## 2015-10-01 ENCOUNTER — Ambulatory Visit (INDEPENDENT_AMBULATORY_CARE_PROVIDER_SITE_OTHER): Payer: Medicare Other

## 2015-10-01 VITALS — BP 134/85 | HR 88 | Ht 68.0 in | Wt 204.4 lb

## 2015-10-01 DIAGNOSIS — I429 Cardiomyopathy, unspecified: Secondary | ICD-10-CM

## 2015-10-01 DIAGNOSIS — I5022 Chronic systolic (congestive) heart failure: Secondary | ICD-10-CM

## 2015-10-01 DIAGNOSIS — I480 Paroxysmal atrial fibrillation: Secondary | ICD-10-CM

## 2015-10-01 DIAGNOSIS — Z954 Presence of other heart-valve replacement: Secondary | ICD-10-CM

## 2015-10-01 DIAGNOSIS — I428 Other cardiomyopathies: Secondary | ICD-10-CM

## 2015-10-01 LAB — ECHOCARDIOGRAM COMPLETE
Height: 68 in
Weight: 3270.4 oz

## 2015-10-01 NOTE — Patient Instructions (Signed)

## 2015-10-05 ENCOUNTER — Telehealth: Payer: Self-pay | Admitting: *Deleted

## 2015-10-05 NOTE — Telephone Encounter (Signed)
-----   Message from Satira Sark, MD sent at 10/01/2015  5:19 PM EDT ----- Results reviewed. LVEF approximately 50% at this time which represents an improvement. Mechanical AVR also functioning normally. We will continue with current medical therapy. A copy of this test should be forwarded to [facility], MD.

## 2015-10-05 NOTE — Telephone Encounter (Signed)
Patient informed and copy sent to PCP. 

## 2015-10-27 ENCOUNTER — Ambulatory Visit (INDEPENDENT_AMBULATORY_CARE_PROVIDER_SITE_OTHER): Payer: Medicare Other | Admitting: *Deleted

## 2015-10-27 ENCOUNTER — Telehealth: Payer: Self-pay

## 2015-10-27 DIAGNOSIS — I5022 Chronic systolic (congestive) heart failure: Secondary | ICD-10-CM | POA: Diagnosis not present

## 2015-10-27 DIAGNOSIS — Z95810 Presence of automatic (implantable) cardiac defibrillator: Secondary | ICD-10-CM | POA: Diagnosis not present

## 2015-10-27 DIAGNOSIS — I428 Other cardiomyopathies: Secondary | ICD-10-CM

## 2015-10-27 NOTE — Telephone Encounter (Signed)
Remote ICM transmission received.  Attempted patient call and left message for return call.   

## 2015-10-27 NOTE — Progress Notes (Signed)
EPIC Encounter for ICM Monitoring  Patient Name: Timothy Fritz. is a 75 y.o. male Date: 10/27/2015 Primary Care Physican: [name], MD Primary Cardiologist: Domenic Polite Electrophysiologist: Allred Dry Weight: unknown Bi-V Pacing:  91%  AT/AF Burden > 99%      Attempted ICM call and unable to reach.  Transmission reviewed.   Thoracic impedance normal   Follow-up plan: ICM clinic phone appointment on 11/27/2015.  Copy of ICM check sent to device physician.   ICM trend: 10/27/2015       [name], RN 10/27/2015 12:20 PM

## 2015-10-27 NOTE — Progress Notes (Signed)
Remote ICD transmission.   

## 2015-10-27 NOTE — Progress Notes (Signed)
Patient returned call.  He stated he did take a Lasix for a couple of days due to increase in SOB but that has resolved.  No changes in meds.  Next ICM remote transmission 11/27/2015

## 2015-10-29 ENCOUNTER — Encounter: Payer: Self-pay | Admitting: Cardiology

## 2015-11-06 LAB — CUP PACEART REMOTE DEVICE CHECK
Battery Remaining Longevity: 46 mo
Battery Remaining Percentage: 53 %
Date Time Interrogation Session: 20170912080018
HIGH POWER IMPEDANCE MEASURED VALUE: 68 Ohm
HIGH POWER IMPEDANCE MEASURED VALUE: 68 Ohm
Implantable Lead Location: 753858
Lead Channel Impedance Value: 380 Ohm
Lead Channel Impedance Value: 430 Ohm
Lead Channel Setting Pacing Amplitude: 2 V
Lead Channel Setting Pacing Amplitude: 2 V
Lead Channel Setting Pacing Pulse Width: 0.5 ms
Lead Channel Setting Pacing Pulse Width: 0.5 ms
MDC IDC LEAD IMPLANT DT: 20140529
MDC IDC LEAD IMPLANT DT: 20140529
MDC IDC LEAD IMPLANT DT: 20140529
MDC IDC LEAD LOCATION: 753859
MDC IDC LEAD LOCATION: 753860
MDC IDC MSMT BATTERY VOLTAGE: 2.95 V
MDC IDC MSMT LEADCHNL LV IMPEDANCE VALUE: 790 Ohm
MDC IDC MSMT LEADCHNL LV PACING THRESHOLD AMPLITUDE: 1 V
MDC IDC MSMT LEADCHNL LV PACING THRESHOLD PULSEWIDTH: 0.5 ms
MDC IDC MSMT LEADCHNL RA SENSING INTR AMPL: 1.6 mV
MDC IDC MSMT LEADCHNL RV PACING THRESHOLD AMPLITUDE: 0.875 V
MDC IDC MSMT LEADCHNL RV PACING THRESHOLD PULSEWIDTH: 0.5 ms
MDC IDC MSMT LEADCHNL RV SENSING INTR AMPL: 11.8 mV
MDC IDC PG SERIAL: 7097390
MDC IDC SET LEADCHNL RV SENSING SENSITIVITY: 0.5 mV

## 2015-11-27 ENCOUNTER — Ambulatory Visit (INDEPENDENT_AMBULATORY_CARE_PROVIDER_SITE_OTHER): Payer: Medicare Other

## 2015-11-27 ENCOUNTER — Telehealth: Payer: Self-pay

## 2015-11-27 DIAGNOSIS — I5022 Chronic systolic (congestive) heart failure: Secondary | ICD-10-CM | POA: Diagnosis not present

## 2015-11-27 DIAGNOSIS — Z95810 Presence of automatic (implantable) cardiac defibrillator: Secondary | ICD-10-CM

## 2015-11-27 NOTE — Telephone Encounter (Signed)
Remote ICM transmission received.  Attempted patient call and left detailed message regarding transmission and next ICM scheduled for 12/29/2015.  Advised to return call for any fluid symptoms or questions.    

## 2015-11-27 NOTE — Progress Notes (Signed)
EPIC Encounter for ICM Monitoring  Patient Name: Timothy Fritz. is a 75 y.o. male Date: 11/27/2015 Primary Care Physican: [name], MD Primary Cardiologist:[place] Electrophysiologist: Allred Dry Weight:    unknown Bi-V Pacing:  92%      AT/AF Burden > 99%           Attempted ICM call and unable to reach. Left detailed message regarding transmission.  Transmission reviewed.   Thoracic impedance normal     Follow-up plan: ICM clinic phone appointment on 12/29/2015.  Copy of ICM check sent to device physician.   ICM trend: 11/27/2015       [name], RN 11/27/2015 3:15 PM

## 2015-12-29 ENCOUNTER — Ambulatory Visit (INDEPENDENT_AMBULATORY_CARE_PROVIDER_SITE_OTHER): Payer: Medicare Other

## 2015-12-29 DIAGNOSIS — Z95810 Presence of automatic (implantable) cardiac defibrillator: Secondary | ICD-10-CM

## 2015-12-29 DIAGNOSIS — I5022 Chronic systolic (congestive) heart failure: Secondary | ICD-10-CM | POA: Diagnosis not present

## 2015-12-30 NOTE — Progress Notes (Signed)
EPIC Encounter for ICM Monitoring  Patient Name: Timothy Fritz. is a 75 y.o. male Date: 12/30/2015 Primary Care Physican: [name], MD Primary Cardiologist:[place] Electrophysiologist: Allred Dry Weight:202 lbs Bi-V Pacing: 93% AT/AF Burden > 99%                                                       Heart Failure questions reviewed, pt asymptomatic   Thoracic impedance normal .  Recommendations: No changes.  Discussed limiting salt during the holidays and to limit to 2000 mg daily.  Encouraged to call for fluid symptoms.    Follow-up plan: ICM clinic phone appointment on 02/01/2016.  Copy of ICM check sent to device physician.   ICM trend: 12/29/2015       Timothy Billings, RN 12/30/2015 2:22 PM

## 2016-02-01 ENCOUNTER — Ambulatory Visit (INDEPENDENT_AMBULATORY_CARE_PROVIDER_SITE_OTHER): Payer: Medicare Other

## 2016-02-01 ENCOUNTER — Ambulatory Visit (INDEPENDENT_AMBULATORY_CARE_PROVIDER_SITE_OTHER): Payer: Medicare Other | Admitting: *Deleted

## 2016-02-01 DIAGNOSIS — I428 Other cardiomyopathies: Secondary | ICD-10-CM

## 2016-02-01 DIAGNOSIS — I5022 Chronic systolic (congestive) heart failure: Secondary | ICD-10-CM | POA: Diagnosis not present

## 2016-02-01 DIAGNOSIS — Z95810 Presence of automatic (implantable) cardiac defibrillator: Secondary | ICD-10-CM | POA: Diagnosis not present

## 2016-02-01 NOTE — Progress Notes (Signed)
EPIC Encounter for ICM Monitoring  Patient Name: Timothy Fritz. is a 75 y.o. male Date: 02/01/2016 Primary Care Physican: [name], MD Primary Cardiologist:[place] Electrophysiologist: Allred Dry Weight:204 lbs Bi-V Pacing: 93% AT/AF Burden > 99%      Heart Failure questions reviewed, pt asymptomatic   Thoracic impedance abnormal suggesting fluid accumulation just for the last night.  Recommendations:  No changes.  Reinforced to limit low salt food choices to 2000 mg day and limiting fluid intake to < 2 liters per day. Encouraged to call for fluid symptoms.  Provided direct ICM number.  Follow-up plan: ICM clinic phone appointment on 03/03/2016.  Copy of ICM check sent to device physician.   ICM trend: 02/01/2016       [name], RN 02/01/2016 2:30 PM

## 2016-02-02 NOTE — Progress Notes (Signed)
Remote ICD transmission.   

## 2016-02-03 ENCOUNTER — Encounter: Payer: Self-pay | Admitting: Cardiology

## 2016-02-03 LAB — CUP PACEART REMOTE DEVICE CHECK
Battery Remaining Percentage: 49 %
Battery Voltage: 2.93 V
Date Time Interrogation Session: 20171218090017
HIGH POWER IMPEDANCE MEASURED VALUE: 68 Ohm
HighPow Impedance: 68 Ohm
Implantable Lead Implant Date: 20140529
Implantable Lead Implant Date: 20140529
Implantable Lead Location: 753859
Implantable Lead Location: 753860
Implantable Pulse Generator Implant Date: 20140529
Lead Channel Impedance Value: 700 Ohm
Lead Channel Pacing Threshold Amplitude: 0.875 V
Lead Channel Pacing Threshold Amplitude: 1 V
Lead Channel Pacing Threshold Pulse Width: 0.5 ms
Lead Channel Pacing Threshold Pulse Width: 0.5 ms
Lead Channel Sensing Intrinsic Amplitude: 11.7 mV
Lead Channel Setting Sensing Sensitivity: 0.5 mV
MDC IDC LEAD IMPLANT DT: 20140529
MDC IDC LEAD LOCATION: 753858
MDC IDC MSMT BATTERY REMAINING LONGEVITY: 41 mo
MDC IDC MSMT LEADCHNL RA IMPEDANCE VALUE: 350 Ohm
MDC IDC MSMT LEADCHNL RA SENSING INTR AMPL: 1.6 mV
MDC IDC MSMT LEADCHNL RV IMPEDANCE VALUE: 380 Ohm
MDC IDC SET LEADCHNL LV PACING AMPLITUDE: 2 V
MDC IDC SET LEADCHNL LV PACING PULSEWIDTH: 0.5 ms
MDC IDC SET LEADCHNL RV PACING AMPLITUDE: 2 V
MDC IDC SET LEADCHNL RV PACING PULSEWIDTH: 0.5 ms
Pulse Gen Serial Number: 7097390

## 2016-02-19 ENCOUNTER — Encounter: Payer: Self-pay | Admitting: Cardiology

## 2016-03-03 ENCOUNTER — Ambulatory Visit (INDEPENDENT_AMBULATORY_CARE_PROVIDER_SITE_OTHER): Payer: Medicare Other

## 2016-03-03 DIAGNOSIS — I5022 Chronic systolic (congestive) heart failure: Secondary | ICD-10-CM

## 2016-03-03 DIAGNOSIS — Z95810 Presence of automatic (implantable) cardiac defibrillator: Secondary | ICD-10-CM | POA: Diagnosis not present

## 2016-03-04 NOTE — Progress Notes (Signed)
EPIC Encounter for ICM Monitoring  Patient Name: Timothy Fritz. is a 76 y.o. male Date: 03/04/2016 Primary Care Physican: [name], MD Primary Cardiologist:[place] Electrophysiologist: Allred Dry Weight:unknown Bi-V Pacing: 92% AT/AF Burden > 99%      Transmission reviewed.  Thoracic impedance normal   Recommendations: None  Follow-up plan: ICM clinic phone appointment on 04/04/2016.  Copy of ICM check sent to device physician.   3 month ICM trend: 03/03/2016   1 Year ICM trend:      Timothy Billings, RN 03/04/2016 6:40 PM

## 2016-03-28 NOTE — Progress Notes (Signed)
Cardiology Office Note  Date: 03/29/2016   ID: Timothy Fowler Sr., DOB 1941/02/01, MRN [id number]  PCP: [name], MD  Primary Cardiologist: Rozann Lesches, MD   Chief Complaint  Patient presents with  . Cardiomyopathy    History of Present Illness: Timothy Stipp. is a 76 y.o. male last seen in August 2017. He presents for a routine follow-up visit. Reports no increasing shortness of breath or chest pain. He has gained some weight, some of this seems to be more caloric than fluid, he has been less active over the winter. He does not report any orthopnea or PND.  He continues to follow in the device clinic with Dr. Rayann Heman, [facility] [facility] CRT-D in place. Most recent thoracic impedance check was normal. No device shocks. No syncope.  He remains on Coumadin with follow-up per Dr. Manuella Ghazi. States that his INR has been well controlled and he has not had any significant adjustments.  Updated echocardiogram from last year is outlined below. LVEF up to 50% at that point.  Past Medical History:  Diagnosis Date  . Aortic aneurysm, thoracic ([place])    Fusiform s/p Bentall procedure 2002  . Aortic valve, bicuspid    Mechanical AVR  . Arthritis   . Basal cell carcinoma of face   . BPH (benign prostatic hypertrophy)   . Coronary atherosclerosis of native coronary artery    Nonobstructive at cardiac catherization 2002  . Essential hypertension, benign   . Left bundle branch block   . Mixed hyperlipidemia   . Nonischemic cardiomyopathy (HCC)    CRT-D  . Persistent atrial fibrillation [facility])     Past Surgical History:  Procedure Laterality Date  . BENTALL PROCEDURE     Dr Cyndia Bent 2002  . BI-VENTRICULAR IMPLANTABLE CARDIOVERTER DEFIBRILLATOR N/A 07/12/2012   Procedure: BI-VENTRICULAR IMPLANTABLE CARDIOVERTER DEFIBRILLATOR  (CRT-D);  Surgeon: [name], MD;  Location: [facility] CATH LAB;  Service: Cardiovascular;  Laterality: N/A;  . BI-VENTRICULAR IMPLANTABLE CARDIOVERTER DEFIBRILLATOR   (CRT-D)  07/11/2012   [facility]- Dr. Rayann Heman  . CARDIAC VALVE REPLACEMENT    . CATARACT EXTRACTION Left 01/21/13  . TONSILLECTOMY AND ADENOIDECTOMY  1949    Current Outpatient Prescriptions  Medication Sig Dispense Refill  . atorvastatin (LIPITOR) 20 MG tablet Take 1 tablet (20 mg total) by mouth every evening. 90 tablet 3  . furosemide (LASIX) 20 MG tablet Take 20 mg by mouth as needed.    Marland Kitchen losartan (COZAAR) 50 MG tablet Take 1 tablet (50 mg total) by mouth daily. 90 tablet 3  . MECLIZINE HCL PO Take by mouth as needed.    . metoprolol succinate (TOPROL-XL) 100 MG 24 hr tablet TAKE 1 AND 1/2 TABLETS BY MOUTH TWICE DAILY 270 tablet 3  . Multiple Vitamin (MULTIVITAMIN WITH MINERALS) TABS Take 1 tablet by mouth daily.    . Omega-3 Fatty Acids (FISH OIL) 1000 MG CAPS Take 1,000 mg by mouth daily.     . Tamsulosin HCl (FLOMAX) 0.4 MG CAPS Take 0.4 mg by mouth daily after supper.     . warfarin (COUMADIN) 5 MG tablet Take 2.5-5 mg by mouth every evening. 1 tablet (5 mg) on Sunday, Tuesday and Thursday, 1/2 tablet (2.5 mg) on all other days MANAGED BY PMD     No current facility-administered medications for this visit.    Allergies:  Patient has no known allergies.   Social History: The patient  reports that he quit smoking about 16 years ago. His smoking  use included Cigarettes. He has a 41.00 pack-year smoking history. He has never used smokeless tobacco. He reports that he drinks about 1.2 oz of alcohol per week . He reports that he does not use drugs.   ROS:  Please see the history of present illness. Otherwise, complete review of systems is positive for none.  All other systems are reviewed and negative.   Physical Exam: VS:  BP (!) 159/84   Pulse 83   Ht _0  (1.727 m)   Wt 213 lb 6.4 oz (96.8 kg)   SpO2 99%   BMI 32.45 kg/m , BMI Body mass index is 32.45 kg/m.  Wt Readings from Last 3 Encounters:  03/29/16 213 lb 6.4 oz (96.8 kg)  10/01/15 204 lb 6.4 oz  (92.7 kg)  07/22/15 204 lb 6.4 oz (92.7 kg)    General: Patient appears comfortable at rest. HEENT: Conjunctiva and lids normal, oropharynx clear with moist mucosa. Neck: Supple, no elevated JVP or carotid bruits, no thyromegaly. Lungs: Clear to auscultation, nonlabored breathing at rest. Cardiac: Regular rate and rhythm, no S3 or significant systolic murmur, no pericardial rub. Abdomen: Soft, nontender, no hepatomegaly, bowel sounds present, no guarding or rebound. Extremities: No pitting edema, distal pulses 2+. Skin: Warm and dry. Musculoskeletal: No kyphosis. Neuropsychiatric: Alert and oriented x3, affect grossly appropriate.  Recent Labwork:  February 2017: BNP 133, hemoglobin 14.5, platelets 130, hemoglobin A1c 5.7, potassium 4.3, cholesterol 129, triglycerides 155, HDL 41, LDL 57  Other Studies Reviewed Today:  Echocardiogram 10/01/2015: Study Conclusions  - Left ventricle: The cavity size was mildly dilated. Wall   thickness was increased in a pattern of mild LVH. The estimated   ejection fraction was 50%. Diffuse hypokinesis. The study is not   technically sufficient to allow evaluation of LV diastolic   function. - Aortic valve: Mechanical prosthesis in aortic position. There was   trivial regurgitation. Peak gradient (S): 17 mm Hg. - Mitral valve: Calcified annulus. There was mild regurgitation. - Left atrium: The atrium was moderately dilated. - Right ventricle: Pacer wire or catheter noted in right ventricle. - Right atrium: Pacer wire or catheter noted in right atrium.   Central venous pressure (est): 3 mm Hg. - Tricuspid valve: There was mild regurgitation. - Pulmonary arteries: PA peak pressure: 26 mm Hg (S). - Pericardium, extracardiac: There was no pericardial effusion.  Impressions:  - Mild LV chamber dilatation with mild LVH and LVEF approximately   50%. Indeterminate diastolic function in the setting of   ventricular pacing with underlying atrial  arrhythmia. Moderate   left atrial enlargement. MAC with mild mitral regurgitation.   Mechanical prosthesis in aortic position with grossly normal   function and trivial regurgitation. Peak gradient 17 mmHg. Device   wire noted within the right heart. Mild tricuspid regurgitation   with PASP 26 mmHg.  Assessment and Plan:  1. Nonischemic cardiomyopathy with LVEF up to 50% by last echocardiogram on medical therapy.  2. Chronic combined heart failure. Recent thoracic impedance normal. His weight is up. We reviewed his medications and discussed diuretic use, particular with weight gain of 2 or 3 pounds in 24 hours or 5 pounds in one week.  3. Aortic stenosis and aortic aneurysm status post Bentall procedure with mechanical AVR. He continues on Coumadin which is followed by Dr. Manuella Ghazi. Valve function grossly normal by echocardiogram last year.  4. History of paroxysmal atrial for ablation/flutter. No recent palpitations. He is on Coumadin and metoprolol.  Current medicines were reviewed  with the patient today.  Disposition: Follow-up in 6 months.  Signed, Timothy Sark, MD, [facility] 03/29/2016 1:56 PM    [facility] [place], [place], [place] [address] Phone: [phone]; Fax: [phone]

## 2016-03-29 ENCOUNTER — Encounter: Payer: Self-pay | Admitting: Cardiology

## 2016-03-29 ENCOUNTER — Ambulatory Visit (INDEPENDENT_AMBULATORY_CARE_PROVIDER_SITE_OTHER): Payer: Medicare Other | Admitting: Cardiology

## 2016-03-29 VITALS — BP 159/84 | HR 83 | Ht 68.0 in | Wt 213.4 lb

## 2016-03-29 DIAGNOSIS — I480 Paroxysmal atrial fibrillation: Secondary | ICD-10-CM

## 2016-03-29 DIAGNOSIS — Z954 Presence of other heart-valve replacement: Secondary | ICD-10-CM | POA: Diagnosis not present

## 2016-03-29 DIAGNOSIS — I5042 Chronic combined systolic (congestive) and diastolic (congestive) heart failure: Secondary | ICD-10-CM | POA: Diagnosis not present

## 2016-03-29 DIAGNOSIS — I428 Other cardiomyopathies: Secondary | ICD-10-CM

## 2016-03-29 NOTE — Patient Instructions (Signed)
Your physician wants you to follow-up in: 6 months with Dr. McDowell You will receive a reminder letter in the mail two months in advance. If you don't receive a letter, please call our office to schedule the follow-up appointment.  Your physician recommends that you continue on your current medications as directed. Please refer to the Current Medication list given to you today.  Thank you for choosing [facility]!!    

## 2016-04-04 ENCOUNTER — Ambulatory Visit (INDEPENDENT_AMBULATORY_CARE_PROVIDER_SITE_OTHER): Payer: Medicare Other

## 2016-04-04 ENCOUNTER — Telehealth: Payer: Self-pay

## 2016-04-04 DIAGNOSIS — Z95810 Presence of automatic (implantable) cardiac defibrillator: Secondary | ICD-10-CM

## 2016-04-04 DIAGNOSIS — I5042 Chronic combined systolic (congestive) and diastolic (congestive) heart failure: Secondary | ICD-10-CM | POA: Diagnosis not present

## 2016-04-04 NOTE — Progress Notes (Signed)
EPIC Encounter for ICM Monitoring  Patient Name: Timothy Fritz. is a 76 y.o. male Date: 04/04/2016 Primary Care Physican: [name], MD Primary Cardiologist:McDowell Electrophysiologist: Allred Dry Weight:unknown Bi-V Pacing: 92% AT/AF Burden > 99%         Attempted call to patient and unable to reach.  Left message to return call.  Transmission reviewed.    Thoracic impedance normal.  Current prescribed dose of Furosemide 20 mg 1 tablet as needed.   Recommendations: NONE - Unable to reach patient   Follow-up plan: ICM clinic phone appointment on 05/05/2016.  Copy of ICM check sent to device physician.   3 month ICM trend: 04/04/2016   1 Year ICM trend:      [name], RN 04/04/2016 1:58 PM

## 2016-04-04 NOTE — Telephone Encounter (Signed)
Remote ICM transmission received.  Attempted patient call and left detailed to return call.  

## 2016-04-22 NOTE — Progress Notes (Signed)
ICM remote transmission rescheduled from 05/05/2016 to 05/12/2016.

## 2016-05-12 ENCOUNTER — Ambulatory Visit (INDEPENDENT_AMBULATORY_CARE_PROVIDER_SITE_OTHER): Payer: Medicare Other | Admitting: *Deleted

## 2016-05-12 DIAGNOSIS — I428 Other cardiomyopathies: Secondary | ICD-10-CM

## 2016-05-12 DIAGNOSIS — I5042 Chronic combined systolic (congestive) and diastolic (congestive) heart failure: Secondary | ICD-10-CM | POA: Diagnosis not present

## 2016-05-12 DIAGNOSIS — Z95810 Presence of automatic (implantable) cardiac defibrillator: Secondary | ICD-10-CM | POA: Diagnosis not present

## 2016-05-12 LAB — CUP PACEART REMOTE DEVICE CHECK
Battery Remaining Longevity: 38 mo
Battery Remaining Percentage: 45 %
Battery Voltage: 2.93 V
Date Time Interrogation Session: 20180329080017
HighPow Impedance: 65 Ohm
HighPow Impedance: 65 Ohm
Implantable Lead Implant Date: 20140529
Implantable Lead Implant Date: 20140529
Implantable Lead Implant Date: 20140529
Implantable Lead Location: 753858
Implantable Lead Location: 753859
Implantable Lead Location: 753860
Implantable Pulse Generator Implant Date: 20140529
Lead Channel Impedance Value: 350 Ohm
Lead Channel Impedance Value: 430 Ohm
Lead Channel Impedance Value: 740 Ohm
Lead Channel Pacing Threshold Amplitude: 0.875 V
Lead Channel Pacing Threshold Amplitude: 1 V
Lead Channel Pacing Threshold Pulse Width: 0.5 ms
Lead Channel Pacing Threshold Pulse Width: 0.5 ms
Lead Channel Sensing Intrinsic Amplitude: 1.6 mV
Lead Channel Sensing Intrinsic Amplitude: 11.7 mV
Lead Channel Setting Pacing Amplitude: 2 V
Lead Channel Setting Pacing Amplitude: 2 V
Lead Channel Setting Pacing Pulse Width: 0.5 ms
Lead Channel Setting Pacing Pulse Width: 0.5 ms
Lead Channel Setting Sensing Sensitivity: 0.5 mV
Pulse Gen Serial Number: 7097390

## 2016-05-12 NOTE — Progress Notes (Signed)
EPIC Encounter for ICM Monitoring  Patient Name: Timothy Fritz. is a 76 y.o. male Date: 05/12/2016 Primary Care Physican: [name], MD Primary Cardiologist:McDowell Electrophysiologist: Allred Dry Weight:unknown Bi-V Pacing: 92% AT/AF Burden > 99%       Heart Failure questions reviewed, pt asymptomatic for fluid but does have a cold.     Thoracic impedance normal .  Current prescribed dose of Furosemide 20 mg 1 tablet as needed.   Recommendations: No changes. Reminded to limit dietary salt intake to 2000 mg/day and fluid intake to < 2 liters/day. Encouraged to call for fluid symptoms.  Follow-up plan: ICM clinic phone appointment on 06/14/2016.  Copy of ICM check sent to device physician.   3 month ICM trend: 05/12/2016   1 Year ICM trend:      [name], RN 05/12/2016 2:45 PM

## 2016-05-12 NOTE — Progress Notes (Signed)
Remote ICD transmission.   

## 2016-05-13 ENCOUNTER — Encounter: Payer: Self-pay | Admitting: Cardiology

## 2016-05-25 ENCOUNTER — Other Ambulatory Visit: Payer: Self-pay | Admitting: Cardiology

## 2016-05-27 ENCOUNTER — Encounter: Payer: Self-pay | Admitting: Cardiology

## 2016-06-14 ENCOUNTER — Ambulatory Visit (INDEPENDENT_AMBULATORY_CARE_PROVIDER_SITE_OTHER): Payer: Medicare Other

## 2016-06-14 DIAGNOSIS — Z95810 Presence of automatic (implantable) cardiac defibrillator: Secondary | ICD-10-CM | POA: Diagnosis not present

## 2016-06-14 DIAGNOSIS — I5042 Chronic combined systolic (congestive) and diastolic (congestive) heart failure: Secondary | ICD-10-CM | POA: Diagnosis not present

## 2016-06-16 NOTE — Progress Notes (Signed)
EPIC Encounter for ICM Monitoring  Patient Name: Timothy Fritz. is a 76 y.o. male Date: 06/16/2016 Primary Care Physican: [name], MD Primary Cardiologist:McDowell Electrophysiologist: Allred Dry Weight:unknown Bi-V Pacing: 92% AT/AF Burden > 99%       Heart Failure questions reviewed, pt asymptomatic.   Thoracic impedance normal.  Current prescribed dose of Furosemide 20 mg 1 tablet as needed  Recommendations: No changes. Encouraged to call for fluid symptoms or use local ER for any urgent symptoms.  Follow-up plan: ICM clinic phone appointment on 07/15/2016.  Office appointment scheduled on 08/05/2016 with Dr Rayann Heman.  Copy of ICM check sent to device physician.   3 month ICM trend: 06/14/2016   1 Year ICM trend:      [name], RN 06/16/2016 5:11 PM

## 2016-07-15 ENCOUNTER — Ambulatory Visit (INDEPENDENT_AMBULATORY_CARE_PROVIDER_SITE_OTHER): Payer: Medicare Other

## 2016-07-15 ENCOUNTER — Encounter: Payer: Medicare Other | Admitting: Internal Medicine

## 2016-07-15 DIAGNOSIS — I5042 Chronic combined systolic (congestive) and diastolic (congestive) heart failure: Secondary | ICD-10-CM

## 2016-07-15 DIAGNOSIS — Z95810 Presence of automatic (implantable) cardiac defibrillator: Secondary | ICD-10-CM | POA: Diagnosis not present

## 2016-07-15 NOTE — Progress Notes (Signed)
EPIC Encounter for ICM Monitoring  Patient Name: Shon Indelicato. is a 76 y.o. male Date: 07/15/2016 Primary Care Physican: [name], MD Primary Cardiologist:McDowell Electrophysiologist: Allred Dry Weight:unknown Bi-V Pacing: 93% AT/AF Burden > 99%                                                            Heart Failure questions reviewed, pt asymptomatic.  He did not have any symptoms during decreased impedance.    Thoracic impedance normal but was abnormal 06/29/2016 to 07/09/2016.  Current prescribed dose of Furosemide 20 mg 1 tablet as needed  Recommendations: No changes.  Encouraged to call for fluid symptoms or use local ER for any urgent symptoms.  Follow-up plan: ICM clinic phone appointment on 09/05/2016. Office appointment scheduled on 08/05/2016 with Dr Rayann Heman.  Copy of ICM check sent to device physician.   3 month ICM trend: 07/15/2016   1 Year ICM trend:      [name], RN 07/15/2016 8:58 AM

## 2016-08-03 ENCOUNTER — Encounter (INDEPENDENT_AMBULATORY_CARE_PROVIDER_SITE_OTHER): Payer: Self-pay | Admitting: Internal Medicine

## 2016-08-05 ENCOUNTER — Encounter: Payer: Medicare Other | Admitting: Internal Medicine

## 2016-08-16 ENCOUNTER — Encounter (INDEPENDENT_AMBULATORY_CARE_PROVIDER_SITE_OTHER): Payer: Self-pay | Admitting: *Deleted

## 2016-08-16 ENCOUNTER — Other Ambulatory Visit (INDEPENDENT_AMBULATORY_CARE_PROVIDER_SITE_OTHER): Payer: Self-pay | Admitting: Internal Medicine

## 2016-08-16 ENCOUNTER — Encounter (INDEPENDENT_AMBULATORY_CARE_PROVIDER_SITE_OTHER): Payer: Self-pay | Admitting: Internal Medicine

## 2016-08-16 ENCOUNTER — Telehealth (INDEPENDENT_AMBULATORY_CARE_PROVIDER_SITE_OTHER): Payer: Self-pay | Admitting: *Deleted

## 2016-08-16 ENCOUNTER — Telehealth (INDEPENDENT_AMBULATORY_CARE_PROVIDER_SITE_OTHER): Payer: Self-pay | Admitting: Internal Medicine

## 2016-08-16 ENCOUNTER — Ambulatory Visit (INDEPENDENT_AMBULATORY_CARE_PROVIDER_SITE_OTHER): Payer: Medicare Other | Admitting: Internal Medicine

## 2016-08-16 VITALS — BP 118/84 | HR 76 | Temp 98.0°F | Ht 68.0 in | Wt 204.5 lb

## 2016-08-16 DIAGNOSIS — R195 Other fecal abnormalities: Secondary | ICD-10-CM

## 2016-08-16 MED ORDER — PEG 3350-KCL-NA BICARB-NACL 420 G PO SOLR: 4000.0000 mL | Freq: Once | ORAL | 0 refills | Status: AC

## 2016-08-16 NOTE — Telephone Encounter (Signed)
Per Sharyn Lull with Dr Manuella Ghazi, it is ok for patient to stop Coumadin 5 days before TCS sch'd 12/01/16

## 2016-08-16 NOTE — Telephone Encounter (Addendum)
Patient needs trilyte -- guaiac pos stool

## 2016-08-16 NOTE — Telephone Encounter (Signed)
err

## 2016-08-16 NOTE — Telephone Encounter (Signed)
Patient is scheuduled to TCS 12/01/16, he has mechanical aortic valve -- will he need antibx prior to TCS

## 2016-08-16 NOTE — Patient Instructions (Signed)
The risks of bleeding, perforation and infection were reviewed with patient.  

## 2016-08-16 NOTE — Progress Notes (Signed)
Subjective:    Patient ID: Timothy Fowler Sr., male    DOB: 10-30-1940, 76 y.o.   MRN: [id number]  HPI Referred by Dr. Manuella Ghazi for blood in stool. Denies seeing any rectal bleeding. No weight loss. Appetite is good.  Has a BM daily. No family hx of colon cancer. No change in his stool.   Evaluated by Dr. Britta Mccreedy in 2015 colonoscopy but no follow. Colonoscopy was not performed.  From his records his last colonoscopy was in 2004. Prep was suboptimal.  Exam was completed only to the proximal ascending colon.     Hx of atrial fib and maintained on Coumadin  Has defibrillator and pacemaker. Has a mechanical aortic valve. Review of Systems Past Medical History:  Diagnosis Date  . Aortic aneurysm, thoracic ([place])    Fusiform s/p Bentall procedure 2002  . Aortic valve, bicuspid    Mechanical AVR  . Arthritis   . Basal cell carcinoma of face   . BPH (benign prostatic hypertrophy)   . Coronary atherosclerosis of native coronary artery    Nonobstructive at cardiac catherization 2002  . Essential hypertension, benign   . Left bundle branch block   . Mixed hyperlipidemia   . Nonischemic cardiomyopathy (HCC)    CRT-D  . Persistent atrial fibrillation [facility])     Past Surgical History:  Procedure Laterality Date  . BENTALL PROCEDURE     Dr Cyndia Bent 2002  . BI-VENTRICULAR IMPLANTABLE CARDIOVERTER DEFIBRILLATOR N/A 07/12/2012   Procedure: BI-VENTRICULAR IMPLANTABLE CARDIOVERTER DEFIBRILLATOR  (CRT-D);  Surgeon: [name], MD;  Location: [facility] CATH LAB;  Service: Cardiovascular;  Laterality: N/A;  . BI-VENTRICULAR IMPLANTABLE CARDIOVERTER DEFIBRILLATOR  (CRT-D)  07/11/2012   [facility]- Dr. Rayann Heman  . CARDIAC VALVE REPLACEMENT    . CATARACT EXTRACTION Left 01/21/13  . TONSILLECTOMY AND ADENOIDECTOMY  1949    No Known Allergies  Current Outpatient Prescriptions on File Prior to Visit  Medication Sig Dispense Refill  . atorvastatin (LIPITOR) 20 MG tablet Take 1 tablet (20  mg total) by mouth every evening. 90 tablet 3  . furosemide (LASIX) 20 MG tablet Take 20 mg by mouth as needed.    Marland Kitchen losartan (COZAAR) 50 MG tablet Take 1 tablet (50 mg total) by mouth daily. 90 tablet 3  . MECLIZINE HCL PO Take by mouth as needed.    . metoprolol succinate (TOPROL-XL) 100 MG 24 hr tablet TAKE 1 & 1/2 TABLETS BY MOUTH TWICE DAILY (FILL 90 DAYS ON NEXT FILL IN [facility]) 270 tablet 1  . Multiple Vitamin (MULTIVITAMIN WITH MINERALS) TABS Take 1 tablet by mouth daily.    . Omega-3 Fatty Acids (FISH OIL) 1000 MG CAPS Take 1,000 mg by mouth daily.     . Tamsulosin HCl (FLOMAX) 0.4 MG CAPS Take 0.4 mg by mouth daily after supper.     . warfarin (COUMADIN) 5 MG tablet Take 2.5-5 mg by mouth every evening. 5mg  a day     No current facility-administered medications on file prior to visit.         Objective:   Physical Exam Blood pressure 118/84, pulse 76, temperature 98 F (36.7 C), height 5\' 8"  (1.727 m), weight 204 lb 8 oz (92.8 kg). Alert and oriented. Skin warm and dry. Oral mucosa is moist.   . Sclera anicteric, conjunctivae is pink. Thyroid not enlarged. No cervical lymphadenopathy. Lungs clear. Heart regular rate and rhythm.  Abdomen is soft. Bowel sounds are positive. No hepatomegaly. No abdominal masses felt. No  tenderness.  No edema to lower extremities.  .         Assessment & Plan:  + guaiac positive stool. Colonic neoplasm needs to be ruled out.  Colonoscopy. The risks of bleeding, perforation and infection were reviewed with patient.

## 2016-09-05 ENCOUNTER — Ambulatory Visit (INDEPENDENT_AMBULATORY_CARE_PROVIDER_SITE_OTHER): Payer: Medicare Other

## 2016-09-05 DIAGNOSIS — I5042 Chronic combined systolic (congestive) and diastolic (congestive) heart failure: Secondary | ICD-10-CM | POA: Diagnosis not present

## 2016-09-05 DIAGNOSIS — Z95810 Presence of automatic (implantable) cardiac defibrillator: Secondary | ICD-10-CM | POA: Diagnosis not present

## 2016-09-05 NOTE — Progress Notes (Signed)
EPIC Encounter for ICM Monitoring  Patient Name: Timothy Fritz. is a 76 y.o. male Date: 09/05/2016 Primary Care Physican: [name], MD Primary Cardiologist:McDowell Electrophysiologist: Allred Dry Weight:unknown Bi-V Pacing: 92% AT/AF Burden > 99%      Heart Failure questions reviewed, pt asymptomatic but does have a cold for the last 8-10 days.   Thoracic impedance normal but was abnormal suggesting fluid accumulation from 07/18/2016 to 07/26/2016, 08/06/2016 to 08/15/2016, 08/21/2016 to 08/25/2016, 08/30/2016 to 09/01/2016.  Prescribed dosage: Furosemide 20 mg 1 tablet as needed  Recommendations: No changes.  Advised to review food labels for hidden salt content since he has been retaining fluid in the last month.  Encouraged to call for fluid symptoms.  Follow-up plan: ICM clinic phone appointment on 10/10/2016.  Office appointment scheduled 09/09/2016 with Dr. Rayann Heman and 09/27/2016 with Dr Domenic Polite.   Copy of ICM check sent to primary cardiologist and device physician.   3 month ICM trend: 09/05/2016   1 Year ICM trend:      [name], RN 09/05/2016 9:10 AM

## 2016-09-09 ENCOUNTER — Encounter: Payer: Self-pay | Admitting: Internal Medicine

## 2016-09-09 ENCOUNTER — Ambulatory Visit (INDEPENDENT_AMBULATORY_CARE_PROVIDER_SITE_OTHER): Payer: Medicare Other | Admitting: Internal Medicine

## 2016-09-09 VITALS — BP 112/70 | HR 90 | Ht 68.0 in | Wt 213.0 lb

## 2016-09-09 DIAGNOSIS — I5042 Chronic combined systolic (congestive) and diastolic (congestive) heart failure: Secondary | ICD-10-CM

## 2016-09-09 DIAGNOSIS — I428 Other cardiomyopathies: Secondary | ICD-10-CM

## 2016-09-09 DIAGNOSIS — I480 Paroxysmal atrial fibrillation: Secondary | ICD-10-CM

## 2016-09-09 NOTE — Progress Notes (Signed)
PCP: Timothy Blitz, MD Primary Cardiologist:  [name] is a 76 y.o. male who presents today for routine electrophysiology followup.  Since last being seen in our clinic, the patient reports doing very well.  + mild SOB upon last visit to see Dr Timothy Fritz.  Followed in [facility] clinic.  Reports significant improvement in SOB with diuresis prn using coreview. Today, he denies symptoms of palpitations, chest pain,  lower extremity edema, dizziness, presyncope, syncope, or ICD shocks.  The patient is otherwise without complaint today.   Past Medical History:  Diagnosis Date  . Aortic aneurysm, thoracic ([place])    Fusiform s/p Bentall procedure 2002  . Aortic valve, bicuspid    Mechanical AVR  . Arthritis   . Basal cell carcinoma of face   . BPH (benign prostatic hypertrophy)   . Coronary atherosclerosis of native coronary artery    Nonobstructive at cardiac catherization 2002  . Essential hypertension, benign   . Left bundle branch block   . Mixed hyperlipidemia   . Nonischemic cardiomyopathy (HCC)    CRT-D  . Persistent atrial fibrillation [facility])    Past Surgical History:  Procedure Laterality Date  . BENTALL PROCEDURE     Dr Timothy Fritz 2002  . BI-VENTRICULAR IMPLANTABLE CARDIOVERTER DEFIBRILLATOR N/A 07/12/2012   Procedure: BI-VENTRICULAR IMPLANTABLE CARDIOVERTER DEFIBRILLATOR  (CRT-D);  Surgeon: Timothy Grayer, MD;  Location: [facility] CATH LAB;  Service: Cardiovascular;  Laterality: N/A;  . BI-VENTRICULAR IMPLANTABLE CARDIOVERTER DEFIBRILLATOR  (CRT-D)  07/11/2012   [facility]- Dr. Rayann Fritz  . CARDIAC VALVE REPLACEMENT    . CATARACT EXTRACTION Left 01/21/13  . TONSILLECTOMY AND ADENOIDECTOMY  1949    ROS- all systems are reviewed and negative except as per HPI above  Current Outpatient Prescriptions  Medication Sig Dispense Refill  . atorvastatin (LIPITOR) 20 MG tablet Take 1 tablet (20 mg total) by mouth every evening. 90 tablet 3  . furosemide (LASIX)  20 MG tablet Take 20 mg by mouth as needed.    Marland Kitchen losartan (COZAAR) 50 MG tablet Take 1 tablet (50 mg total) by mouth daily. 90 tablet 3  . MECLIZINE HCL PO Take by mouth as needed.    . metoprolol succinate (TOPROL-XL) 100 MG 24 hr tablet TAKE 1 & 1/2 TABLETS BY MOUTH TWICE DAILY (FILL 90 DAYS ON NEXT FILL IN [facility]) 270 tablet 1  . Multiple Vitamin (MULTIVITAMIN WITH MINERALS) TABS Take 1 tablet by mouth daily.    . Omega-3 Fatty Acids (FISH OIL) 1000 MG CAPS Take 1,000 mg by mouth daily.     . Tamsulosin HCl (FLOMAX) 0.4 MG CAPS Take 0.4 mg by mouth daily after supper.     . warfarin (COUMADIN) 5 MG tablet Take 2.5-5 mg by mouth every evening. 5mg  a day     No current facility-administered medications for this visit.     Physical Exam: Vitals:   09/09/16 1329  BP: 112/70  Pulse: 90  SpO2: 96%  Weight: 213 lb (96.6 kg)  Height: 5\' 8"  (1.727 m)    GEN- The patient is well appearing, alert and oriented x 3 today.   Head- normocephalic, atraumatic Eyes-  Sclera clear, conjunctiva pink Ears- hearing intact Oropharynx- clear Lungs- Clear to ausculation bilaterally, normal work of breathing Chest- ICD pocket is well healed Heart- Regular rate and rhythm (paced), mechical S2 GI- soft, NT, ND, + BS Extremities- no clubbing, cyanosis, or edema  ICD interrogation- reviewed in detail today,  See PACEART report  Assessment and Plan:  1.  Chronic systolic dysfunction euvolemic today Stable on an appropriate medical regimen Normal BiV ICD function See Pace Art report No changes today SJM fortify assura battery advisery again discussed today. 92% BiV paced He has again declined AV nodal ablation today. IF shortness of breath worsens, would reconsider  2. Persistent afib Anticoagulated with coumadin for valvular afib  3. Mechanical AVR Continue coumadin long term  4. HTN Stable No change required today  [place] Follow-up with Dr Timothy Fritz as scheduled I will see again in 1  year  Timothy Grayer MD, [facility] 09/09/2016 1:52 PM

## 2016-09-09 NOTE — Patient Instructions (Signed)
Medication Instructions:   Your physician recommends that you continue on your current medications as directed. Please refer to the Current Medication list given to you today.  Labwork:  NONE  Testing/Procedures:  NONE  Follow-Up: Your physician recommends that you schedule a follow-up appointment in: 1 year. Please schedule this appointment today before leaving the office.  Any Other Special Instructions Will Be Listed Below (If Applicable).  Your next device check from home is on 12/12/16.  If you need a refill on your cardiac medications before your next appointment, please call your pharmacy.

## 2016-09-27 ENCOUNTER — Ambulatory Visit: Payer: Medicare Other | Admitting: Cardiology

## 2016-10-10 ENCOUNTER — Ambulatory Visit (INDEPENDENT_AMBULATORY_CARE_PROVIDER_SITE_OTHER): Payer: Medicare Other

## 2016-10-10 ENCOUNTER — Telehealth: Payer: Self-pay

## 2016-10-10 DIAGNOSIS — I5042 Chronic combined systolic (congestive) and diastolic (congestive) heart failure: Secondary | ICD-10-CM

## 2016-10-10 DIAGNOSIS — Z95810 Presence of automatic (implantable) cardiac defibrillator: Secondary | ICD-10-CM

## 2016-10-10 NOTE — Telephone Encounter (Signed)
Remote ICM transmission received.  Attempted call to patient and left detailed message regarding transmission and next ICM scheduled for 11/17/2016.  Advised to return call for any fluid symptoms or questions.

## 2016-10-10 NOTE — Progress Notes (Signed)
EPIC Encounter for ICM Monitoring  Patient Name: Dre Gamino. is a 76 y.o. male Date: 10/10/2016 Primary Care Physican: [name], MD Primary Cardiologist:McDowell Electrophysiologist: Allred Dry Weight:unknown Bi-V Pacing: 92% AT/AF Burden > 99%        Attempted call to patient and unable to reach.  Left detailed message regarding transmission.  Transmission reviewed.    Thoracic impedance normal but was abnormal suggesting fluid accumulation from 09/25/2016 to 10/03/2016.  Prescribed dosage: Furosemide 20 mg 1 tablet as needed  Recommendations: Left voice mail with ICM number and encouraged to call for fluid symptoms.  Follow-up plan: ICM clinic phone appointment on 11/17/2016.  Office appointment scheduled 10/19/2016 with Dr. Domenic Polite.  Copy of ICM check sent to Dr. Rayann Heman.   3 month ICM trend: 10/10/2016   1 Year ICM trend:      [name], RN 10/10/2016 10:59 AM

## 2016-10-19 ENCOUNTER — Encounter: Payer: Self-pay | Admitting: Cardiology

## 2016-10-19 ENCOUNTER — Ambulatory Visit (INDEPENDENT_AMBULATORY_CARE_PROVIDER_SITE_OTHER): Payer: Medicare Other | Admitting: Cardiology

## 2016-10-19 VITALS — BP 118/70 | HR 82 | Ht 68.0 in | Wt 206.2 lb

## 2016-10-19 DIAGNOSIS — I480 Paroxysmal atrial fibrillation: Secondary | ICD-10-CM

## 2016-10-19 DIAGNOSIS — I5042 Chronic combined systolic (congestive) and diastolic (congestive) heart failure: Secondary | ICD-10-CM

## 2016-10-19 DIAGNOSIS — Z954 Presence of other heart-valve replacement: Secondary | ICD-10-CM | POA: Diagnosis not present

## 2016-10-19 DIAGNOSIS — I428 Other cardiomyopathies: Secondary | ICD-10-CM

## 2016-10-19 NOTE — Progress Notes (Signed)
Cardiology Office Note  Date: 10/19/2016   ID: Timothy Fowler Sr., DOB [birth year], MRN [id number]  PCP: [name], MD  Primary Cardiologist: Rozann Lesches, MD   Chief Complaint  Patient presents with  . Cardiomyopathy  . Atrial Fibrillation    History of Present Illness: Timothy Fritz. is a 76 y.o. male last seen in February. He presents for a routine follow-up visit. Reports no chest pain or palpitations, feels like he has been doing well. His weight is down about 5 pounds, he does not report any orthopnea or PND.  He continues to follow with Dr. Manuella Ghazi on Coumadin. Reports no bleeding episodes.  He follows with Dr. Rayann Heman in the device clinic, [place] CRT-D in place. Recent visit noted in July. 92% biventricular pacing noted at that point. He has declined AV node ablation.  I reviewed his medications which are outlined below. He has had no changes from a cardiac perspective.  I reviewed his echocardiogram from last year which revealed LVEF approximately 50%.  Past Medical History:  Diagnosis Date  . Aortic aneurysm, thoracic ([place])    Fusiform s/p Bentall procedure 2002  . Aortic valve, bicuspid    Mechanical AVR  . Arthritis   . Basal cell carcinoma of face   . BPH (benign prostatic hypertrophy)   . Coronary atherosclerosis of native coronary artery    Nonobstructive at cardiac catherization 2002  . Essential hypertension, benign   . Left bundle branch block   . Mixed hyperlipidemia   . Nonischemic cardiomyopathy (HCC)    CRT-D  . Persistent atrial fibrillation [facility])     Past Surgical History:  Procedure Laterality Date  . BENTALL PROCEDURE     Dr Cyndia Bent 2002  . BI-VENTRICULAR IMPLANTABLE CARDIOVERTER DEFIBRILLATOR N/A 07/12/2012   Procedure: BI-VENTRICULAR IMPLANTABLE CARDIOVERTER DEFIBRILLATOR  (CRT-D);  Surgeon: [name], MD;  Location: [facility] CATH LAB;  Service: Cardiovascular;  Laterality: N/A;  . BI-VENTRICULAR IMPLANTABLE CARDIOVERTER  DEFIBRILLATOR  (CRT-D)  07/11/2012   [facility]- Dr. Rayann Heman  . CARDIAC VALVE REPLACEMENT    . CATARACT EXTRACTION Left 01/21/13  . TONSILLECTOMY AND ADENOIDECTOMY  1949    Current Outpatient Prescriptions  Medication Sig Dispense Refill  . atorvastatin (LIPITOR) 20 MG tablet Take 1 tablet (20 mg total) by mouth every evening. 90 tablet 3  . furosemide (LASIX) 20 MG tablet Take 20 mg by mouth as needed.    Marland Kitchen losartan (COZAAR) 50 MG tablet Take 1 tablet (50 mg total) by mouth daily. 90 tablet 3  . MECLIZINE HCL PO Take by mouth as needed.    . metoprolol succinate (TOPROL-XL) 100 MG 24 hr tablet TAKE 1 & 1/2 TABLETS BY MOUTH TWICE DAILY (FILL 90 DAYS ON NEXT FILL IN [facility]) 270 tablet 1  . Multiple Vitamin (MULTIVITAMIN WITH MINERALS) TABS Take 1 tablet by mouth daily.    . Omega-3 Fatty Acids (FISH OIL) 1000 MG CAPS Take 1,000 mg by mouth daily.     . Tamsulosin HCl (FLOMAX) 0.4 MG CAPS Take 0.4 mg by mouth daily after supper.     . warfarin (COUMADIN) 5 MG tablet Take 2.5-5 mg by mouth every evening. 54m a day     No current facility-administered medications for this visit.    Allergies:  Patient has no known allergies.   Social History: The patient  reports that he quit smoking about 16 years ago. His smoking use included Cigarettes. He has a 41.00 pack-year smoking history.  He has never used smokeless tobacco. He reports that he drinks about 1.2 oz of alcohol per week . He reports that he does not use drugs.   ROS:  Please see the history of present illness. Otherwise, complete review of systems is positive for none.  All other systems are reviewed and negative.   Physical Exam: VS:  BP 118/70   Pulse 82   Ht _0  (1.727 m)   Wt 206 lb 3.2 oz (93.5 kg)   SpO2 97%   BMI 31.35 kg/m , BMI Body mass index is 31.35 kg/m.  Wt Readings from Last 3 Encounters:  10/19/16 206 lb 3.2 oz (93.5 kg)  09/09/16 213 lb (96.6 kg)  08/16/16 204 lb 8 oz (92.8 kg)    General:  Patient appears comfortable at rest. HEENT: Conjunctiva and lids normal, oropharynx clear with moist mucosa. Neck: Supple, no elevated JVP or carotid bruits, no thyromegaly. Lungs: Clear to auscultation, nonlabored breathing at rest. Cardiac: Regular rate and rhythm, no S3 or significant systolic murmur, no pericardial rub. Abdomen: Soft, nontender, no hepatomegaly, bowel sounds present, no guarding or rebound. Extremities: No pitting edema, distal pulses 2+. Skin: Warm and dry. Musculoskeletal: No kyphosis. Neuropsychiatric: Alert and oriented x3, affect grossly appropriate.  ECG: I personally reviewed the tracing from 09/09/2016 which showed a ventricular paced rhythm with intermittent PVCs.  Recent Labwork:  February 2017: BNP 133, hemoglobin 14.5, platelets 130, hemoglobin A1c 5.7, potassium 4.3, cholesterol 129, triglycerides 155, HDL 41, LDL 57  Other Studies Reviewed Today:  Echocardiogram 10/01/2015: Study Conclusions  - Left ventricle: The cavity size was mildly dilated. Wall   thickness was increased in a pattern of mild LVH. The estimated   ejection fraction was 50%. Diffuse hypokinesis. The study is not   technically sufficient to allow evaluation of LV diastolic   function. - Aortic valve: Mechanical prosthesis in aortic position. There was   trivial regurgitation. Peak gradient (S): 17 mm Hg. - Mitral valve: Calcified annulus. There was mild regurgitation. - Left atrium: The atrium was moderately dilated. - Right ventricle: Pacer wire or catheter noted in right ventricle. - Right atrium: Pacer wire or catheter noted in right atrium.   Central venous pressure (est): 3 mm Hg. - Tricuspid valve: There was mild regurgitation. - Pulmonary arteries: PA peak pressure: 26 mm Hg (S). - Pericardium, extracardiac: There was no pericardial effusion.  Impressions:  - Mild LV chamber dilatation with mild LVH and LVEF approximately   50%. Indeterminate diastolic function in  the setting of   ventricular pacing with underlying atrial arrhythmia. Moderate   left atrial enlargement. MAC with mild mitral regurgitation.   Mechanical prosthesis in aortic position with grossly normal   function and trivial regurgitation. Peak gradient 17 mmHg. Device   wire noted within the right heart. Mild tricuspid regurgitation   with PASP 26 mmHg.  Assessment and Plan:  1. Chronic combined heart failure, symptomatically stable. His weight is down about 5 pounds. No changes made to current regimen.  2. Nonischemic cardiomyopathy with LVEF 50% at last assessment on medical therapy.  3. Aortic valve disease status post Bentall procedure with mechanical AVR. He remains on Coumadin with INR followed by Dr. Manuella Ghazi. Valve function grossly normal by echocardiogram last year.  4. Paroxysmal atrial fibrillation/flutter. He is symptomatically stable and remains on beta blocker.  Current medicines were reviewed with the patient today.  Disposition: Follow-up in 6 months.  Signed, Satira Sark, MD, [facility] 10/19/2016 2:00 PM  [facility] [place], [place], [place] [address] Phone: [phone]; Fax: [phone]

## 2016-10-19 NOTE — Patient Instructions (Signed)

## 2016-11-17 ENCOUNTER — Ambulatory Visit (INDEPENDENT_AMBULATORY_CARE_PROVIDER_SITE_OTHER): Payer: Medicare Other

## 2016-11-17 ENCOUNTER — Telehealth: Payer: Self-pay

## 2016-11-17 DIAGNOSIS — I5042 Chronic combined systolic (congestive) and diastolic (congestive) heart failure: Secondary | ICD-10-CM

## 2016-11-17 DIAGNOSIS — Z95810 Presence of automatic (implantable) cardiac defibrillator: Secondary | ICD-10-CM | POA: Diagnosis not present

## 2016-11-17 NOTE — Telephone Encounter (Signed)
Remote ICM transmission received.  Attempted call to patient and left message to return call regarding transmission. 

## 2016-11-17 NOTE — Progress Notes (Signed)
EPIC Encounter for ICM Monitoring  Patient Name: Timothy Fritz. is a 76 y.o. male Date: 11/17/2016 Primary Care Physican: [name], MD Primary Cardiologist:[place] Electrophysiologist: Allred Dry Weight:unknown Bi-V Pacing: 92% AT/AF Burden > 99%      Attempted call to patient and unable to reach.  Left message to return call regarding transmission.  Transmission reviewed.    Thoracic impedance abnormal suggesting fluid accumulation since 11/15/2016.  Prescribed dosage: Furosemide 20 mg 1 tablet as needed  Recommendations:  NONE - Unable to reach patient.  If patient is reached, will advise to take PRN Furosemide as prescribed for 2-3 days for fluid accumulation.  Follow-up plan: ICM clinic phone appointment on 11/25/2016 to recheck fluid levels.    Copy of ICM check sent to Dr. Domenic Polite and Dr. Rayann Heman.   3 month ICM trend: 11/17/2016   1 Year ICM trend:      [name], RN 11/17/2016 8:42 AM

## 2016-11-17 NOTE — Progress Notes (Addendum)
Patient returned call.  He reported taking PRN Furosemide yesterday and advised him to take 1 tablet for the next 2 days, after the 2 days return to PRN.  He said his weigh is up to 204 lbs today which is a gain of about 2 lbs.  He reported feeling fine.  Advised will recheck again on 11/25/2016.  He has a colonoscopy scheduled for 11/24/2016.

## 2016-11-22 ENCOUNTER — Other Ambulatory Visit: Payer: Self-pay | Admitting: *Deleted

## 2016-11-22 MED ORDER — METOPROLOL SUCCINATE ER 100 MG PO TB24: ORAL_TABLET | ORAL | 1 refills | Status: DC

## 2016-11-25 ENCOUNTER — Telehealth: Payer: Self-pay

## 2016-11-25 ENCOUNTER — Ambulatory Visit (INDEPENDENT_AMBULATORY_CARE_PROVIDER_SITE_OTHER): Payer: Medicare Other

## 2016-11-25 DIAGNOSIS — I5042 Chronic combined systolic (congestive) and diastolic (congestive) heart failure: Secondary | ICD-10-CM

## 2016-11-25 DIAGNOSIS — Z95810 Presence of automatic (implantable) cardiac defibrillator: Secondary | ICD-10-CM

## 2016-11-25 NOTE — Progress Notes (Signed)
EPIC Encounter for ICM Monitoring  Patient Name: Timothy Fritz. is a 76 y.o. male Date: 11/25/2016 Primary Care Physican: [name], MD Primary Cardiologist:McDowell Electrophysiologist: Allred Dry Weight:unknown Bi-V Pacing: 92% AT/AF Burden > 99%      Attempted call to patient and unable to reach.  Left detailed message regarding transmission.  Transmission reviewed.    Thoracic impedance normal but was abnormal suggesting fluid accumulation from 11/06/2016 to 11/09/2016 and 11/15/2016 to 11/19/2016.  Prescribed dosage: Furosemide 20 mg 1 tablet as needed  Recommendations: Left voice mail with ICM number and encouraged to call if experiencing any fluid symptoms.  Follow-up plan: ICM clinic phone appointment on 12/26/2016.   Copy of ICM check sent to Dr. Rayann Heman.   3 month ICM trend: 11/25/2016   1 Year ICM trend:      [name], RN 11/25/2016 10:57 AM

## 2016-11-25 NOTE — Telephone Encounter (Signed)
Remote ICM transmission received.  Attempted call to patient and left detailed message per DPR regarding transmission and next ICM scheduled for 12/26/2016.  Advised to return call for any fluid symptoms or questions.    

## 2016-12-01 ENCOUNTER — Encounter (HOSPITAL_COMMUNITY): Payer: Self-pay

## 2016-12-01 ENCOUNTER — Encounter (HOSPITAL_COMMUNITY)
Admission: RE | Disposition: A | Discharge status: Home / Self Care | Payer: Self-pay | Source: Ambulatory Visit | Attending: Internal Medicine

## 2016-12-01 ENCOUNTER — Ambulatory Visit (HOSPITAL_COMMUNITY)
Admission: RE | Admit: 2016-12-01 | Discharge: 2016-12-01 | Disposition: A | Discharge status: Home / Self Care | Payer: Medicare Other | Source: Ambulatory Visit | Attending: Internal Medicine | Admitting: Internal Medicine

## 2016-12-01 DIAGNOSIS — R195 Other fecal abnormalities: Secondary | ICD-10-CM | POA: Insufficient documentation

## 2016-12-01 DIAGNOSIS — E782 Mixed hyperlipidemia: Secondary | ICD-10-CM | POA: Insufficient documentation

## 2016-12-01 DIAGNOSIS — N400 Enlarged prostate without lower urinary tract symptoms: Secondary | ICD-10-CM | POA: Insufficient documentation

## 2016-12-01 DIAGNOSIS — Z7901 Long term (current) use of anticoagulants: Secondary | ICD-10-CM | POA: Insufficient documentation

## 2016-12-01 DIAGNOSIS — K5730 Diverticulosis of large intestine without perforation or abscess without bleeding: Secondary | ICD-10-CM | POA: Insufficient documentation

## 2016-12-01 DIAGNOSIS — Z87891 Personal history of nicotine dependence: Secondary | ICD-10-CM | POA: Diagnosis not present

## 2016-12-01 DIAGNOSIS — I10 Essential (primary) hypertension: Secondary | ICD-10-CM | POA: Insufficient documentation

## 2016-12-01 DIAGNOSIS — I481 Persistent atrial fibrillation: Secondary | ICD-10-CM | POA: Insufficient documentation

## 2016-12-01 DIAGNOSIS — I712 Thoracic aortic aneurysm, without rupture: Secondary | ICD-10-CM | POA: Insufficient documentation

## 2016-12-01 DIAGNOSIS — Z79899 Other long term (current) drug therapy: Secondary | ICD-10-CM | POA: Insufficient documentation

## 2016-12-01 DIAGNOSIS — Z85828 Personal history of other malignant neoplasm of skin: Secondary | ICD-10-CM | POA: Insufficient documentation

## 2016-12-01 DIAGNOSIS — K644 Residual hemorrhoidal skin tags: Secondary | ICD-10-CM | POA: Insufficient documentation

## 2016-12-01 DIAGNOSIS — Z952 Presence of prosthetic heart valve: Secondary | ICD-10-CM | POA: Insufficient documentation

## 2016-12-01 DIAGNOSIS — I447 Left bundle-branch block, unspecified: Secondary | ICD-10-CM | POA: Diagnosis not present

## 2016-12-01 DIAGNOSIS — I2510 Atherosclerotic heart disease of native coronary artery without angina pectoris: Secondary | ICD-10-CM | POA: Diagnosis not present

## 2016-12-01 HISTORY — PX: COLONOSCOPY: SHX5424

## 2016-12-01 SURGERY — COLONOSCOPY
Anesthesia: Moderate Sedation

## 2016-12-01 MED ORDER — STERILE WATER FOR IRRIGATION IR SOLN
Status: DC | PRN
  Administered 2016-12-01: 14:00:00

## 2016-12-01 MED ORDER — MEPERIDINE HCL 50 MG/ML IJ SOLN
INTRAMUSCULAR | Status: AC
Start: 2016-12-01 — End: 2016-12-02
  Filled 2016-12-01: qty 1

## 2016-12-01 MED ORDER — SODIUM CHLORIDE 0.9 % IV SOLN
INTRAVENOUS | Status: DC
  Administered 2016-12-01: 14:00:00 via INTRAVENOUS

## 2016-12-01 MED ORDER — MEPERIDINE HCL 50 MG/ML IJ SOLN
INTRAMUSCULAR | Status: DC | PRN
  Administered 2016-12-01 (×2): 25 mg via INTRAVENOUS

## 2016-12-01 MED ORDER — MIDAZOLAM HCL 5 MG/5ML IJ SOLN
INTRAMUSCULAR | Status: DC | PRN
  Administered 2016-12-01: 1 mg via INTRAVENOUS
  Administered 2016-12-01 (×2): 2 mg via INTRAVENOUS

## 2016-12-01 MED ORDER — MIDAZOLAM HCL 5 MG/5ML IJ SOLN
INTRAMUSCULAR | Status: AC
  Filled 2016-12-01: qty 10

## 2016-12-01 NOTE — Discharge Instructions (Signed)
Resume usual medications including warfarin as before. INR in 7-10 days. High fiber diet. No driving for 24 hours.  Colonoscopy, Adult, Care After This sheet gives you information about how to care for yourself after your procedure. Your health care provider may also give you more specific instructions. If you have problems or questions, contact your health care provider. What can I expect after the procedure? After the procedure, it is common to have:  A small amount of blood in your stool for 24 hours after the procedure.  Some gas.  Mild abdominal cramping or bloating.  Follow these instructions at home: General instructions   For the first 24 hours after the procedure: ? Do not drive or use machinery. ? Do not sign important documents. ? Do not drink alcohol. ? Do your regular daily activities at a slower pace than normal. ? Eat soft, easy-to-digest foods. ? Rest often.  Take over-the-counter or prescription medicines only as told by your health care provider.  It is up to you to get the results of your procedure. Ask your health care provider, or the department performing the procedure, when your results will be ready. Relieving cramping and bloating  Try walking around when you have cramps or feel bloated.  Apply heat to your abdomen as told by your health care provider. Use a heat source that your health care provider recommends, such as a moist heat pack or a heating pad. ? Place a towel between your skin and the heat source. ? Leave the heat on for 20-30 minutes. ? Remove the heat if your skin turns bright red. This is especially important if you are unable to feel pain, heat, or cold. You may have a greater risk of getting burned. Eating and drinking  Drink enough fluid to keep your urine clear or pale yellow.  Resume your normal diet as instructed by your health care provider. Avoid heavy or fried foods that are hard to digest.  Avoid drinking alcohol for as  long as instructed by your health care provider. Contact a health care provider if:  You have blood in your stool 2-3 days after the procedure. Get help right away if:  You have more than a small spotting of blood in your stool.  You pass large blood clots in your stool.  Your abdomen is swollen.  You have nausea or vomiting.  You have a fever.  You have increasing abdominal pain that is not relieved with medicine. This information is not intended to replace advice given to you by your health care provider. Make sure you discuss any questions you have with your health care provider. Document Released: 09/15/2003 Document Revised: 10/26/2015 Document Reviewed: 04/14/2015 Elsevier Interactive Patient Education  2018 [employer].   Diverticulosis Diverticulosis is a condition that develops when small pouches (diverticula) form in the wall of the large intestine (colon). The colon is where water is absorbed and stool is formed. The pouches form when the inside layer of the colon pushes through weak spots in the outer layers of the colon. You may have a few pouches or many of them. What are the causes? The cause of this condition is not known. What increases the risk? The following factors may make you more likely to develop this condition:  Being older than age 90. Your risk for this condition increases with age. Diverticulosis is rare among people younger than age 67. By age 50, many people have it.  Eating a low-fiber diet.  Having frequent  constipation.  Being overweight.  Not getting enough exercise.  Smoking.  Taking over-the-counter pain medicines, like aspirin and ibuprofen.  Having a family history of diverticulosis.  What are the signs or symptoms? In most people, there are no symptoms of this condition. If you do have symptoms, they may include:  Bloating.  Cramps in the abdomen.  Constipation or diarrhea.  Pain in the lower left side of the  abdomen.  How is this diagnosed? This condition is most often diagnosed during an exam for other colon problems. Because diverticulosis usually has no symptoms, it often cannot be diagnosed independently. This condition may be diagnosed by:  Using a flexible scope to examine the colon (colonoscopy).  Taking an X-ray of the colon after dye has been put into the colon (barium enema).  Doing a CT scan.  How is this treated? You may not need treatment for this condition if you have never developed an infection related to diverticulosis. If you have had an infection before, treatment may include:  Eating a high-fiber diet. This may include eating more fruits, vegetables, and grains.  Taking a fiber supplement.  Taking a live bacteria supplement (probiotic).  Taking medicine to relax your colon.  Taking antibiotic medicines.  Follow these instructions at home:  Drink 6-8 glasses of water or more each day to prevent constipation.  Try not to strain when you have a bowel movement.  If you have had an infection before: ? Eat more fiber as directed by your health care provider or your diet and nutrition specialist (dietitian). ? Take a fiber supplement or probiotic, if your health care provider approves.  Take over-the-counter and prescription medicines only as told by your health care provider.  If you were prescribed an antibiotic, take it as told by your health care provider. Do not stop taking the antibiotic even if you start to feel better.  Keep all follow-up visits as told by your health care provider. This is important. Contact a health care provider if:  You have pain in your abdomen.  You have bloating.  You have cramps.  You have not had a bowel movement in 3 days. Get help right away if:  Your pain gets worse.  Your bloating becomes very bad.  You have a fever or chills, and your symptoms suddenly get worse.  You vomit.  You have bowel movements that are  bloody or black.  You have bleeding from your rectum. Summary  Diverticulosis is a condition that develops when small pouches (diverticula) form in the wall of the large intestine (colon).  You may have a few pouches or many of them.  This condition is most often diagnosed during an exam for other colon problems.  If you have had an infection related to diverticulosis, treatment may include increasing the fiber in your diet, taking supplements, or taking medicines. This information is not intended to replace advice given to you by your health care provider. Make sure you discuss any questions you have with your health care provider. Document Released: 10/29/2003 Document Revised: 12/21/2015 Document Reviewed: 12/21/2015 Elsevier Interactive Patient Education  2017 [employer].  High-Fiber Diet Fiber, also called dietary fiber, is a type of carbohydrate found in fruits, vegetables, whole grains, and beans. A high-fiber diet can have many health benefits. Your health care provider may recommend a high-fiber diet to help:  Prevent constipation. Fiber can make your bowel movements more regular.  Lower your cholesterol.  Relieve hemorrhoids, uncomplicated diverticulosis, or irritable bowel  syndrome.  Prevent overeating as part of a weight-loss plan.  Prevent heart disease, type 2 diabetes, and certain cancers.  What is my plan? The recommended daily intake of fiber includes:  38 grams for men under age 9.  74 grams for men over age 90.  93 grams for women under age 69.  68 grams for women over age 37.  You can get the recommended daily intake of dietary fiber by eating a variety of fruits, vegetables, grains, and beans. Your health care provider may also recommend a fiber supplement if it is not possible to get enough fiber through your diet. What do I need to know about a high-fiber diet?  Fiber supplements have not been widely studied for their effectiveness, so it is  better to get fiber through food sources.  Always check the fiber content on thenutrition facts label of any prepackaged food. Look for foods that contain at least 5 grams of fiber per serving.  Ask your dietitian if you have questions about specific foods that are related to your condition, especially if those foods are not listed in the following section.  Increase your daily fiber consumption gradually. Increasing your intake of dietary fiber too quickly may cause bloating, cramping, or gas.  Drink plenty of water. Water helps you to digest fiber. What foods can I eat? Grains Whole-grain breads. Multigrain cereal. Oats and oatmeal. Brown rice. Barley. Bulgur wheat. [place]. Bran muffins. Popcorn. Rye wafer crackers. Vegetables Sweet potatoes. Spinach. Kale. Artichokes. Cabbage. Broccoli. Green peas. Carrots. Squash. Fruits Berries. Pears. Apples. Oranges. Avocados. Prunes and raisins. Dried figs. Meats and Other Protein Sources Navy, kidney, pinto, and soy beans. Split peas. Lentils. Nuts and seeds. Dairy Fiber-fortified yogurt. Beverages Fiber-fortified soy milk. Fiber-fortified orange juice. Other Fiber bars. The items listed above may not be a complete list of recommended foods or beverages. Contact your dietitian for more options. What foods are not recommended? Grains White bread. Pasta made with refined flour. White rice. Vegetables Fried potatoes. Canned vegetables. Well-cooked vegetables. Fruits Fruit juice. Cooked, strained fruit. Meats and Other Protein Sources Fatty cuts of meat. Fried Sales executive or fried fish. Dairy Milk. Yogurt. Cream cheese. Sour cream. Beverages Soft drinks. Other Cakes and pastries. Butter and oils. The items listed above may not be a complete list of foods and beverages to avoid. Contact your dietitian for more information. What are some tips for including high-fiber foods in my diet?  Eat a wide variety of high-fiber foods.  Make sure  that half of all grains consumed each day are whole grains.  Replace breads and cereals made from refined flour or white flour with whole-grain breads and cereals.  Replace white rice with brown rice, bulgur wheat, or millet.  Start the day with a breakfast that is high in fiber, such as a cereal that contains at least 5 grams of fiber per serving.  Use beans in place of meat in soups, salads, or pasta.  Eat high-fiber snacks, such as berries, raw vegetables, nuts, or popcorn. This information is not intended to replace advice given to you by your health care provider. Make sure you discuss any questions you have with your health care provider. Document Released: 01/31/2005 Document Revised: 07/09/2015 Document Reviewed: 07/16/2013 Elsevier Interactive Patient Education  2017 [employer].

## 2016-12-01 NOTE — Op Note (Signed)
[facility] Patient Name: Timothy Fritz Procedure Date: 12/01/2016 1:41 PM MRN: [id number] Date of Birth: 1940-03-25 Attending MD: Hildred Laser , MD CSN: [id number] Age: 76 Admit Type: Outpatient Procedure:                Colonoscopy Indications:              Heme positive stool Providers:                Hildred Laser, MD, Otis Peak B. Sharon Seller, RN, Randa Spike, Technician Referring MD:             Fuller Canada Manuella Ghazi, MD Medicines:                Meperidine 50 mg IV, Midazolam 5 mg IV Complications:            No immediate complications. Estimated Blood Loss:     Estimated blood loss: none. Procedure:                Pre-Anesthesia Assessment:                           - Prior to the procedure, a History and Physical                            was performed, and patient medications and                            allergies were reviewed. The patient's tolerance of                            previous anesthesia was also reviewed. The risks                            and benefits of the procedure and the sedation                            options and risks were discussed with the patient.                            All questions were answered, and informed consent                            was obtained. Prior Anticoagulants: The patient                            last took Coumadin (warfarin) 5 days prior to the                            procedure. ASA Grade Assessment: III - A patient                            with severe systemic disease. After reviewing the  risks and benefits, the patient was deemed in                            satisfactory condition to undergo the procedure.                           After obtaining informed consent, the colonoscope                            was passed under direct vision. Throughout the                            procedure, the patient's blood pressure, pulse, and       oxygen saturations were monitored continuously. The                            [id number] ([id number]) scope was introduced through                            the anus and advanced to the the cecum, identified                            by appendiceal orifice and ileocecal valve. The                            colonoscopy was performed without difficulty. The                            patient tolerated the procedure well. The quality                            of the bowel preparation was good. The ileocecal                            valve, appendiceal orifice, and rectum were                            photographed. Scope In: 2:06:36 PM Scope Out: 2:28:30 PM Scope Withdrawal Time: 0 hours 11 minutes 28 seconds  Total Procedure Duration: 0 hours 21 minutes 54 seconds  Findings:      The perianal and digital rectal examinations were normal.      A few medium-mouthed diverticula were found in the splenic flexure and       transverse colon.      Multiple small and large-mouthed diverticula were found in the sigmoid       colon.      External hemorrhoids were found during retroflexion. The hemorrhoids       were small. Impression:               - Diverticulosis at the splenic flexure and in the                            transverse colon.                           -  Diverticulosis in the sigmoid colon.                           - External hemorrhoids.                           - No specimens collected. Moderate Sedation:      Moderate (conscious) sedation was administered by the endoscopy nurse       and supervised by the endoscopist. The following parameters were       monitored: oxygen saturation, heart rate, blood pressure, CO2       capnography and response to care. Total physician intraservice time was       27 minutes. Recommendation:           - Patient has a contact number available for                            emergencies. The signs and symptoms of potential                             delayed complications were discussed with the                            patient. Return to normal activities tomorrow.                            Written discharge instructions were provided to the                            patient.                           - High fiber diet today.                           - Continue present medications.                           - Resume Coumadin (warfarin) at prior dose today.                            Refer to [facility] for further adjustment of                            therapy.                           - No repeat colonoscopy due to age and the absence                            of advanced adenomas. Procedure Code(s):        --- Professional ---                           [phone], Colonoscopy, flexible; diagnostic, including  collection of specimen(s) by brushing or washing,                            when performed (separate procedure)                           99152, Moderate sedation services provided by the                            same physician or other qualified health care                            professional performing the diagnostic or                            therapeutic service that the sedation supports,                            requiring the presence of an independent trained                            observer to assist in the monitoring of the                            patient's level of consciousness and physiological                            status; initial 15 minutes of intraservice time,                            patient age 44 years or older                           [id number], Moderate sedation services; each additional                            15 minutes intraservice time Diagnosis Code(s):        --- Professional ---                           K64.4, Residual hemorrhoidal skin tags                           R19.5, Other fecal abnormalities                            K57.30, Diverticulosis of large intestine without                            perforation or abscess without bleeding CPT copyright 2016 American Medical Association. All rights reserved. The codes documented in this report are preliminary and upon coder review may  be revised to meet current compliance requirements. Hildred Laser, MD Hildred Laser, MD 12/01/2016 2:34:51 PM This report has been signed electronically. Number of Addenda: 0

## 2016-12-01 NOTE — H&P (Signed)
Timothy Fowler Sr. is an 76 y.o. male.   Chief Complaint: patient is here for colonoscopy. HPI: patient is 76 year old Caucasian male who was found to have heme-positive stooland is therefore undergoing colonoscopy. He had colonoscopy but 13 years ago and it was reported normal. He was scheduled to have one in 3 years ago but he could not. He denies abdominal pain change in bowel habits or rectal bleeding. He states he had an episode of diverticulitis 7 years ago but has not had any problems since then. He has been off warfarin for 5 days. Family history is negative for CRC.  Past Medical History:  Diagnosis Date  . Aortic aneurysm, thoracic ([place])    Fusiform s/p Bentall procedure 2002  . Aortic valve, bicuspid    Mechanical AVR  . Arthritis   . Basal cell carcinoma of face   . BPH (benign prostatic hypertrophy)   . Coronary atherosclerosis of native coronary artery    Nonobstructive at cardiac catherization 2002  . Essential hypertension, benign   . Left bundle branch block   . Mixed hyperlipidemia   . Nonischemic cardiomyopathy (HCC)    CRT-D  . Persistent atrial fibrillation [facility])     Past Surgical History:  Procedure Laterality Date  . BENTALL PROCEDURE     Dr Cyndia Bent 2002  . BI-VENTRICULAR IMPLANTABLE CARDIOVERTER DEFIBRILLATOR N/A 07/12/2012   Procedure: BI-VENTRICULAR IMPLANTABLE CARDIOVERTER DEFIBRILLATOR  (CRT-D);  Surgeon: [name], MD;  Location: [facility] CATH LAB;  Service: Cardiovascular;  Laterality: N/A;  . BI-VENTRICULAR IMPLANTABLE CARDIOVERTER DEFIBRILLATOR  (CRT-D)  07/11/2012   [facility]- Dr. Rayann Heman  . CARDIAC VALVE REPLACEMENT    . CATARACT EXTRACTION Left 01/21/13  . TONSILLECTOMY AND ADENOIDECTOMY  1949    Family History  Problem Relation Age of Onset  . Coronary artery disease Father    Social History:  reports that he quit smoking about 16 years ago. His smoking use included Cigarettes. He has a 41.00 pack-year smoking history. He has  never used smokeless tobacco. He reports that he drinks about 1.2 oz of alcohol per week . He reports that he does not use drugs.  Allergies: No Known Allergies  Medications Prior to Admission  Medication Sig Dispense Refill  . acetaminophen (TYLENOL) 500 MG tablet Take 1,000 mg by mouth every 8 (eight) hours as needed for moderate pain or headache.    Marland Kitchen atorvastatin (LIPITOR) 10 MG tablet Take 10 mg by mouth daily.    . cholecalciferol (VITAMIN D) 1000 units tablet Take 1,000 Units by mouth daily.    . furosemide (LASIX) 20 MG tablet Take 20 mg by mouth daily as needed for fluid or edema.     . hydrocortisone cream 1 % Apply 1 application topically daily as needed for itching.    . losartan (COZAAR) 50 MG tablet Take 1 tablet (50 mg total) by mouth daily. (Patient taking differently: Take 50 mg by mouth every evening. ) 90 tablet 3  . meclizine (ANTIVERT) 25 MG tablet Take 25 mg by mouth 3 (three) times daily as needed for dizziness.    . metoprolol succinate (TOPROL-XL) 100 MG 24 hr tablet TAKE 1 & 1/2 TABLETS BY MOUTH TWICE DAILY 270 tablet 1  . metroNIDAZOLE (METROCREAM) 0.75 % cream Apply 1 application topically daily as needed (rosacea).    . Omega-3 Fatty Acids (FISH OIL) 1000 MG CAPS Take 1,000 mg by mouth daily.     Marland Kitchen oxymetazoline (AFRIN) 0.05 % nasal spray Place 1  spray into both nostrils daily as needed for congestion.    . phenylephrine (NEO-SYNEPHRINE) 1 % nasal spray Place 1 drop into both nostrils daily as needed for congestion.    . Tamsulosin HCl (FLOMAX) 0.4 MG CAPS Take 0.4 mg by mouth daily after supper.     . vitamin C (ASCORBIC ACID) 500 MG tablet Take 500 mg by mouth daily.    Marland Kitchen warfarin (COUMADIN) 5 MG tablet Take 2.5-5 mg by mouth every evening. Take 2.5 mg on Monday and Friday, on all other days take 5mg  daily      No results found for this or any previous visit (from the past 48 hour(s)). No results found.  ROS  Blood pressure 124/81, pulse 80, temperature 97.7  F (36.5 C), temperature source Oral, resp. rate 17, height 5\' 8"  (1.727 m), weight 197 lb (89.4 kg), SpO2 100 %. Physical Exam  Constitutional: He appears well-developed and well-nourished.  HENT:  Mouth/Throat: Oropharynx is clear and moist.  Eyes: Conjunctivae are normal.  Neck: No thyromegaly present.  Cardiovascular:  Regular rhythm normal S1 and loud S2. No murmur or gallop noted.  Respiratory: Effort normal and breath sounds normal.  GI: Soft. He exhibits no distension and no mass. There is no tenderness.  Musculoskeletal: He exhibits no edema.  Lymphadenopathy:    He has no cervical adenopathy.  Neurological: He is alert.  Skin: Skin is warm and dry.     Assessment/Plan Heme positive stool. Diagnostic colonoscopy.  Hildred Laser, MD 12/01/2016, 1:57 PM

## 2016-12-06 ENCOUNTER — Encounter (HOSPITAL_COMMUNITY): Payer: Self-pay | Admitting: Internal Medicine

## 2016-12-26 ENCOUNTER — Telehealth: Payer: Self-pay

## 2016-12-26 ENCOUNTER — Ambulatory Visit (INDEPENDENT_AMBULATORY_CARE_PROVIDER_SITE_OTHER): Payer: Medicare Other | Admitting: *Deleted

## 2016-12-26 DIAGNOSIS — I5042 Chronic combined systolic (congestive) and diastolic (congestive) heart failure: Secondary | ICD-10-CM | POA: Diagnosis not present

## 2016-12-26 DIAGNOSIS — I428 Other cardiomyopathies: Secondary | ICD-10-CM

## 2016-12-26 DIAGNOSIS — Z95810 Presence of automatic (implantable) cardiac defibrillator: Secondary | ICD-10-CM | POA: Diagnosis not present

## 2016-12-26 NOTE — Progress Notes (Signed)
EPIC Encounter for ICM Monitoring  Patient Name: Ousmane Seeman. is a 76 y.o. male Date: 12/26/2016 Primary Care Physican: [name], MD Primary Cardiologist:[place] Electrophysiologist: Allred Dry Weight:unknown Bi-V Pacing: 92% AT/AF Burden > 99%      Attempted call to patient and unable to reach.  Left detailed message regarding transmission.  Transmission reviewed.    [place]: Thoracic impedance normal.  Prescribed dosage: Furosemide 20 mg 1 tablet as needed  Recommendations: Left voice mail with ICM number and encouraged to call if experiencing any fluid symptoms.  Follow-up plan: ICM clinic phone appointment on 01/26/2017.    Copy of ICM check sent to Dr. Rayann Heman.   3 month ICM trend: 12/26/2016    1 Year ICM trend:       [name], RN 12/26/2016 5:09 PM

## 2016-12-26 NOTE — Telephone Encounter (Signed)
Remote ICM transmission received.  Attempted call to patient and left detailed message per DPR regarding transmission and next ICM scheduled for 01/26/2017.  Advised to return call for any fluid symptoms or questions.

## 2016-12-26 NOTE — Progress Notes (Signed)
Remote ICD transmission.   

## 2016-12-28 LAB — CUP PACEART REMOTE DEVICE CHECK
Battery Remaining Longevity: 31 mo
HIGH POWER IMPEDANCE MEASURED VALUE: 69 Ohm
HighPow Impedance: 69 Ohm
Implantable Lead Implant Date: 20140529
Implantable Lead Location: 753859
Implantable Pulse Generator Implant Date: 20140529
Lead Channel Impedance Value: 380 Ohm
Lead Channel Pacing Threshold Amplitude: 1.125 V
Lead Channel Pacing Threshold Pulse Width: 0.5 ms
Lead Channel Sensing Intrinsic Amplitude: 11.7 mV
Lead Channel Setting Pacing Amplitude: 2 V
Lead Channel Setting Pacing Amplitude: 2.125
Lead Channel Setting Pacing Pulse Width: 0.5 ms
Lead Channel Setting Pacing Pulse Width: 0.5 ms
Lead Channel Setting Sensing Sensitivity: 0.5 mV
MDC IDC LEAD IMPLANT DT: 20140529
MDC IDC LEAD IMPLANT DT: 20140529
MDC IDC LEAD LOCATION: 753858
MDC IDC LEAD LOCATION: 753860
MDC IDC MSMT BATTERY REMAINING PERCENTAGE: 36 %
MDC IDC MSMT BATTERY VOLTAGE: 2.92 V
MDC IDC MSMT LEADCHNL LV IMPEDANCE VALUE: 740 Ohm
MDC IDC MSMT LEADCHNL LV PACING THRESHOLD AMPLITUDE: 0.875 V
MDC IDC MSMT LEADCHNL RA SENSING INTR AMPL: 1.6 mV
MDC IDC MSMT LEADCHNL RV IMPEDANCE VALUE: 440 Ohm
MDC IDC MSMT LEADCHNL RV PACING THRESHOLD PULSEWIDTH: 0.5 ms
MDC IDC PG SERIAL: 7097390
MDC IDC SESS DTM: 20181112104516

## 2016-12-30 ENCOUNTER — Encounter: Payer: Self-pay | Admitting: Cardiology

## 2017-01-13 ENCOUNTER — Telehealth: Payer: Self-pay | Admitting: Cardiology

## 2017-01-13 NOTE — Telephone Encounter (Signed)
LMOVM requesting that pt send manual transmission b/c home monitor has not updated in at least 7 days.    

## 2017-01-26 ENCOUNTER — Ambulatory Visit (INDEPENDENT_AMBULATORY_CARE_PROVIDER_SITE_OTHER): Payer: Medicare Other

## 2017-01-26 DIAGNOSIS — Z95810 Presence of automatic (implantable) cardiac defibrillator: Secondary | ICD-10-CM

## 2017-01-26 DIAGNOSIS — I5042 Chronic combined systolic (congestive) and diastolic (congestive) heart failure: Secondary | ICD-10-CM

## 2017-01-26 NOTE — Progress Notes (Signed)
EPIC Encounter for ICM Monitoring  Patient Name: Timothy Fritz. is a 76 y.o. male Date: 01/26/2017 Primary Care Physican: [name], MD Primary Cardiologist:McDowell Electrophysiologist: Allred Dry Weight:203 lbs Bi-V Pacing: 92% AT/AF Burden > 99%                                                      Heart Failure questions reviewed, pt said his fluid symptom is usually feeling congested in the head area and he took a Furosemide today.     Thoracic impedance abnormal suggesting fluid accumulation for 2 days.  Prescribed dosage: Furosemide 20 mg 1 tablet as needed  Recommendations:  Advised to take PRN Furosemide x 2-3 days and then return to PRN.  Encouraged to call for fluid symptoms.  Follow-up plan: ICM clinic phone appointment on 01/31/2017 to recheck fluid levels.   He asked to be called on his cell phone number next week because he is not at home, [phone].  Copy of ICM check sent to Dr. Rayann Heman and Dr. Domenic Polite.   3 month ICM trend: 01/25/2017    1 Year ICM trend:       [name], RN 01/26/2017 10:09 AM

## 2017-02-02 ENCOUNTER — Ambulatory Visit (INDEPENDENT_AMBULATORY_CARE_PROVIDER_SITE_OTHER): Payer: Self-pay

## 2017-02-02 DIAGNOSIS — I5042 Chronic combined systolic (congestive) and diastolic (congestive) heart failure: Secondary | ICD-10-CM

## 2017-02-02 DIAGNOSIS — Z95810 Presence of automatic (implantable) cardiac defibrillator: Secondary | ICD-10-CM

## 2017-02-02 NOTE — Progress Notes (Signed)
EPIC Encounter for ICM Monitoring  Patient Name: Timothy Fritz. is a 76 y.o. male Date: 02/02/2017 Primary Care Physican: [name], MD Primary Cardiologist:Mc[place]owell Electrophysiologist: Allred Dry Weight:Previous weight 203 lbs Bi-V Pacing: 92% AT/AF Burden > 99%      Patient called asking about 01/31/2017 transmission.  Advised fluid levels returned close to normal after taking PRN Furosemide.  He stated he is feeling fine at this time.    Thoracic impedance returned close to normal  Prescribed dosage: Furosemide 20 mg 1 tablet as needed  Recommendations:  Advised to limit salt intake to 2000 mg/day and to monitor closely during the holidays.  Encouraged to call for fluid symptoms.  Follow-up plan: ICM clinic phone appointment on 02/27/2017.    Copy of ICM check sent to Dr. Rayann Heman and Dr. Domenic Polite.   3 month ICM trend: 01/31/2017    1 Year ICM trend:       [name], RN 02/02/2017 9:58 AM

## 2017-02-27 ENCOUNTER — Telehealth: Payer: Self-pay

## 2017-02-27 ENCOUNTER — Ambulatory Visit (INDEPENDENT_AMBULATORY_CARE_PROVIDER_SITE_OTHER): Payer: Medicare Other

## 2017-02-27 DIAGNOSIS — I5042 Chronic combined systolic (congestive) and diastolic (congestive) heart failure: Secondary | ICD-10-CM

## 2017-02-27 DIAGNOSIS — Z95810 Presence of automatic (implantable) cardiac defibrillator: Secondary | ICD-10-CM | POA: Diagnosis not present

## 2017-02-27 NOTE — Telephone Encounter (Signed)
LMOVM requesting that pt send manual transmission 

## 2017-03-02 NOTE — Progress Notes (Signed)
EPIC Encounter for ICM Monitoring  Patient Name: Timothy Fritz. is a 77 y.o. male Date: 03/02/2017 Primary Care Physican: [name], MD Primary Cardiologist:[place]owell Electrophysiologist: Allred Dry Weight:203 lbs Bi-V Pacing: 92% AT/AF Burden > 99%        Heart Failure questions reviewed, pt asymptomatic.  Patient is staying with sick friend and is not at home a lot.    Thoracic impedance normal.  Prescribed dosage: Furosemide 20 mg 1 tablet as needed  Recommendations: No changes.   Encouraged to call for fluid symptoms.  Follow-up plan: ICM clinic phone appointment on 04/03/2017.    Copy of ICM check sent to Dr. Rayann Heman.   3 month ICM trend: 02/28/2017    1 Year ICM trend:       [name], RN 03/02/2017 2:17 PM

## 2017-03-13 ENCOUNTER — Other Ambulatory Visit: Payer: Self-pay | Admitting: Cardiology

## 2017-04-03 ENCOUNTER — Ambulatory Visit (INDEPENDENT_AMBULATORY_CARE_PROVIDER_SITE_OTHER): Payer: Medicare Other | Admitting: *Deleted

## 2017-04-03 DIAGNOSIS — I5042 Chronic combined systolic (congestive) and diastolic (congestive) heart failure: Secondary | ICD-10-CM

## 2017-04-03 DIAGNOSIS — Z95810 Presence of automatic (implantable) cardiac defibrillator: Secondary | ICD-10-CM | POA: Diagnosis not present

## 2017-04-03 DIAGNOSIS — I428 Other cardiomyopathies: Secondary | ICD-10-CM

## 2017-04-03 NOTE — Progress Notes (Signed)
EPIC Encounter for ICM Monitoring  Patient Name: Timothy Fritz. is a 77 y.o. male Date: 04/03/2017 Primary Care Physican: [name], MD Primary Cardiologist:[place]owell Electrophysiologist: Allred Dry Weight:202 lbs Bi-V Pacing: 92% AT/AF Burden > 99%      Heart Failure questions reviewed, pt asymptomatic. Weight is stable.  He reported he does not stick to a strict low salt diet.  He takes Furosemide when he has some SOB or leg swelling.  He took Furosemide for the past 2 days   Thoracic impedance normal.  Prescribed dosage: Furosemide 20 mg 1 tablet as needed  Recommendations: No changes.   Encouraged to call for fluid symptoms.  Follow-up plan: ICM clinic phone appointment on 05/04/2017.   Copy of ICM check sent to Dr. Rayann Heman.   3 month ICM trend: 04/03/2017    1 Year ICM trend:       [name], RN 04/03/2017 1:37 PM

## 2017-04-04 NOTE — Progress Notes (Signed)
Remote ICD transmission.   

## 2017-04-05 LAB — CUP PACEART REMOTE DEVICE CHECK
Battery Remaining Longevity: 28 mo
Battery Voltage: 2.92 V
HIGH POWER IMPEDANCE MEASURED VALUE: 70 Ohm
HighPow Impedance: 70 Ohm
Implantable Lead Implant Date: 20140529
Implantable Lead Implant Date: 20140529
Implantable Lead Location: 753858
Implantable Lead Location: 753859
Implantable Lead Location: 753860
Lead Channel Impedance Value: 360 Ohm
Lead Channel Impedance Value: 710 Ohm
Lead Channel Pacing Threshold Amplitude: 1 V
Lead Channel Pacing Threshold Amplitude: 1 V
Lead Channel Pacing Threshold Pulse Width: 0.5 ms
Lead Channel Pacing Threshold Pulse Width: 0.5 ms
Lead Channel Sensing Intrinsic Amplitude: 11.7 mV
Lead Channel Setting Pacing Pulse Width: 0.5 ms
Lead Channel Setting Sensing Sensitivity: 0.5 mV
MDC IDC LEAD IMPLANT DT: 20140529
MDC IDC MSMT BATTERY REMAINING PERCENTAGE: 33 %
MDC IDC MSMT LEADCHNL RA SENSING INTR AMPL: 1.6 mV
MDC IDC MSMT LEADCHNL RV IMPEDANCE VALUE: 430 Ohm
MDC IDC PG IMPLANT DT: 20140529
MDC IDC PG SERIAL: 7097390
MDC IDC SESS DTM: 20190218090017
MDC IDC SET LEADCHNL LV PACING AMPLITUDE: 2 V
MDC IDC SET LEADCHNL LV PACING PULSEWIDTH: 0.5 ms
MDC IDC SET LEADCHNL RV PACING AMPLITUDE: 2 V

## 2017-04-06 ENCOUNTER — Encounter: Payer: Self-pay | Admitting: Cardiology

## 2017-05-04 ENCOUNTER — Ambulatory Visit (INDEPENDENT_AMBULATORY_CARE_PROVIDER_SITE_OTHER): Payer: Medicare Other

## 2017-05-04 DIAGNOSIS — Z95810 Presence of automatic (implantable) cardiac defibrillator: Secondary | ICD-10-CM

## 2017-05-04 DIAGNOSIS — I5042 Chronic combined systolic (congestive) and diastolic (congestive) heart failure: Secondary | ICD-10-CM | POA: Diagnosis not present

## 2017-05-04 NOTE — Progress Notes (Signed)
EPIC Encounter for ICM Monitoring  Patient Name: Timothy Fritz. is a 77 y.o. male Date: 05/04/2017 Primary Care Physican: [name], MD Primary Cardiologist:[place]owell Electrophysiologist: Allred Dry Weight:207 lbs Bi-V Pacing: 92% AT/AF Burden > 99%      Heart Failure questions reviewed, pt asymptomatic but did have some SOB during decreased impedance but resolved after taking 1 PRN Furosemide dose.    Thoracic impedance normal but was abnormal suggesting fluid accumulation from 04/30/2017 through 05/02/2017.  Prescribed dosage: Furosemide 20 mg 1 tablet as needed  Recommendations: No changes.  Encouraged to call for fluid symptoms.  Follow-up plan: ICM clinic phone appointment on 06/05/2017.   Copy of ICM check sent to Dr. Rayann Heman.   3 month ICM trend: 05/04/2017    1 Year ICM trend:       [name], RN 05/04/2017 11:33 AM

## 2017-05-12 ENCOUNTER — Ambulatory Visit: Payer: Medicare Other | Admitting: Cardiology

## 2017-06-05 ENCOUNTER — Telehealth: Payer: Self-pay | Admitting: Cardiology

## 2017-06-05 ENCOUNTER — Ambulatory Visit (INDEPENDENT_AMBULATORY_CARE_PROVIDER_SITE_OTHER): Payer: Medicare HMO

## 2017-06-05 DIAGNOSIS — Z95810 Presence of automatic (implantable) cardiac defibrillator: Secondary | ICD-10-CM

## 2017-06-05 DIAGNOSIS — I5042 Chronic combined systolic (congestive) and diastolic (congestive) heart failure: Secondary | ICD-10-CM

## 2017-06-05 NOTE — Telephone Encounter (Signed)
LMOVM reminding pt to send remote transmission.   

## 2017-06-06 ENCOUNTER — Telehealth: Payer: Self-pay

## 2017-06-06 NOTE — Progress Notes (Signed)
EPIC Encounter for ICM Monitoring  Patient Name: Timothy Fritz. is a 77 y.o. male Date: 06/06/2017 Primary Care Physican: [name], MD Primary Cardiologist:[place]owell Electrophysiologist: Allred Dry Weight:Previous weight 207 lbs Bi-V Pacing: 92% AT/AF Burden > 99%      Attempted call to patient and unable to reach.  Left detailed message regarding transmission.  Transmission reviewed.    Thoracic impedance normal.  Prescribed dosage: Furosemide 20 mg 1 tablet as needed  Recommendations: Left voice mail with ICM number and encouraged to call if experiencing any fluid symptoms.  Follow-up plan: ICM clinic phone appointment on 07/06/2017.  Office appointment scheduled 06/20/2017 with Dr. Domenic Polite.  Copy of ICM check sent to Dr. Rayann Heman.   3 month ICM trend: 06/06/2017    1 Year ICM trend:       [name], RN 06/06/2017 10:09 AM

## 2017-06-06 NOTE — Telephone Encounter (Signed)
Remote ICM transmission received.  Attempted call to patient and left detailed message per DPR regarding transmission and next ICM scheduled for 07/06/2017.  Advised to return call for any fluid symptoms or questions.    

## 2017-06-19 NOTE — Progress Notes (Signed)
Cardiology Office Note  Date: 06/20/2017   ID: Timothy Fowler Sr., DOB November 10, 1940, MRN [id number]  PCP: [name], MD  Primary Cardiologist: Rozann Lesches, MD   Chief Complaint  Patient presents with  . Cardiomyopathy    History of Present Illness: Timothy Fritz. is a 77 y.o. male last seen in September 2018.  He presents for a routine visit.  Reports no progressive shortness of breath, no chest pain or palpitations.  He continues on Coumadin with follow-up per Dr. Manuella Ghazi.  He does not report any bleeding episodes.  He follows in the device clinic with Dr. Rayann Heman, [facility] [facility] CRT-D in place.  Recent thoracic impedance was normal.  I personally reviewed his ECG today which shows a ventricular paced rhythm with PVC.  Went over his medications.  He states that he is having trouble getting Toprol-XL covered by his insurance company.  He is on relatively high dose which is divided twice daily, and if we had to switch to short acting metoprolol, we would probably divide this 3 times daily.  Past Medical History:  Diagnosis Date  . Aortic aneurysm, thoracic ([place])    Fusiform s/p Bentall procedure 2002  . Aortic valve, bicuspid    Mechanical AVR  . Arthritis   . Basal cell carcinoma of face   . BPH (benign prostatic hypertrophy)   . Coronary atherosclerosis of native coronary artery    Nonobstructive at cardiac catherization 2002  . Essential hypertension, benign   . Left bundle branch block   . Mixed hyperlipidemia   . Nonischemic cardiomyopathy (HCC)    CRT-D  . Persistent atrial fibrillation [facility])     Past Surgical History:  Procedure Laterality Date  . BENTALL PROCEDURE     Dr Cyndia Bent 2002  . BI-VENTRICULAR IMPLANTABLE CARDIOVERTER DEFIBRILLATOR N/A 07/12/2012   Procedure: BI-VENTRICULAR IMPLANTABLE CARDIOVERTER DEFIBRILLATOR  (CRT-D);  Surgeon: [name], MD;  Location: [facility] CATH LAB;  Service: Cardiovascular;  Laterality: N/A;  . BI-VENTRICULAR IMPLANTABLE  CARDIOVERTER DEFIBRILLATOR  (CRT-D)  07/11/2012   [facility]- Dr. Rayann Heman  . CARDIAC VALVE REPLACEMENT    . CATARACT EXTRACTION Left 01/21/13  . COLONOSCOPY N/A 12/01/2016   Procedure: COLONOSCOPY;  Surgeon: Rogene Houston, MD;  [facility];  Service: Endoscopy;  Laterality: N/A;  2:00  . TONSILLECTOMY AND ADENOIDECTOMY  1949    Current Outpatient Medications  Medication Sig Dispense Refill  . atorvastatin (LIPITOR) 10 MG tablet Take 10 mg by mouth daily.    . cholecalciferol (VITAMIN D) 1000 units tablet Take 1,000 Units by mouth daily.    . furosemide (LASIX) 20 MG tablet Take 20 mg by mouth daily as needed for fluid or edema.     . hydrocortisone cream 1 % Apply 1 application topically daily as needed for itching.    . losartan (COZAAR) 50 MG tablet Take 1 tablet (50 mg total) by mouth daily. (Patient taking differently: Take 50 mg by mouth every evening. ) 90 tablet 3  . meclizine (ANTIVERT) 25 MG tablet Take 25 mg by mouth 3 (three) times daily as needed for dizziness.    . metoprolol succinate (TOPROL-XL) 100 MG 24 hr tablet TAKE 1 AND 1/2 TABLETS BY MOUTH TWICE DAILY 270 tablet 1  . metroNIDAZOLE (METROCREAM) 0.75 % cream Apply 1 application topically daily as needed (rosacea).    . Omega-3 Fatty Acids (FISH OIL) 1000 MG CAPS Take 1,000 mg by mouth daily.     Marland Kitchen  oxymetazoline (AFRIN) 0.05 % nasal spray Place 1 spray into both nostrils daily as needed for congestion.    . phenylephrine (NEO-SYNEPHRINE) 1 % nasal spray Place 1 drop into both nostrils daily as needed for congestion.    . Tamsulosin HCl (FLOMAX) 0.4 MG CAPS Take 0.4 mg by mouth daily after supper.     . vitamin C (ASCORBIC ACID) 500 MG tablet Take 500 mg by mouth daily.    Marland Kitchen warfarin (COUMADIN) 5 MG tablet Take 2.5-5 mg by mouth every evening. Take 2.5 mg on Monday and Friday, on all other days take 77m daily     No current facility-administered medications for this visit.    Allergies:   Patient has no known allergies.   Social History: The patient  reports that he quit smoking about 17 years ago. His smoking use included cigarettes. He has a 41.00 pack-year smoking history. He has never used smokeless tobacco. He reports that he drinks about 1.2 oz of alcohol per week. He reports that he does not use drugs.   ROS:  Please see the history of present illness. Otherwise, complete review of systems is positive for hearing loss.  All other systems are reviewed and negative.   Physical Exam: VS:  BP 128/78   Pulse 81   Ht 5' 8"  (1.727 m)   Wt 211 lb (95.7 kg)   SpO2 97%   BMI 32.08 kg/m , BMI Body mass index is 32.08 kg/m.  Wt Readings from Last 3 Encounters:  06/20/17 211 lb (95.7 kg)  12/01/16 197 lb (89.4 kg)  10/19/16 206 lb 3.2 oz (93.5 kg)    General: Patient appears comfortable at rest. HEENT: Conjunctiva and lids normal, oropharynx clear. Neck: Supple, no elevated JVP or carotid bruits, no thyromegaly. Lungs: Clear to auscultation, nonlabored breathing at rest. Cardiac: Regular rate and rhythm, no S3 or significant systolic murmur, no pericardial rub. Abdomen: Soft, nontender, bowel sounds present. Extremities: No pitting edema, distal pulses 2+. Skin: Warm and dry. Musculoskeletal: No kyphosis. Neuropsychiatric: Alert and oriented x3, affect grossly appropriate.  ECG: I personally reviewed the tracing from 09/09/2016 which showed a ventricular paced rhythm with intermittent PVCs.  Recent Labwork:  February 2017: BNP 133, hemoglobin 14.5, platelets 130, hemoglobin A1c 5.7, potassium 4.3, cholesterol 129, triglycerides 155, HDL 41, LDL 57  Other Studies Reviewed Today:  Echocardiogram 10/01/2015: Study Conclusions  - Left ventricle: The cavity size was mildly dilated. Wall thickness was increased in a pattern of mild LVH. The estimated ejection fraction was 50%. Diffuse hypokinesis. The study is not technically sufficient to allow evaluation of LV  diastolic function. - Aortic valve: Mechanical prosthesis in aortic position. There was trivial regurgitation. Peak gradient (S): 17 mm Hg. - Mitral valve: Calcified annulus. There was mild regurgitation. - Left atrium: The atrium was moderately dilated. - Right ventricle: Pacer wire or catheter noted in right ventricle. - Right atrium: Pacer wire or catheter noted in right atrium. Central venous pressure (est): 3 mm Hg. - Tricuspid valve: There was mild regurgitation. - Pulmonary arteries: PA peak pressure: 26 mm Hg (S). - Pericardium, extracardiac: There was no pericardial effusion.  Impressions:  - Mild LV chamber dilatation with mild LVH and LVEF approximately 50%. Indeterminate diastolic function in the setting of ventricular pacing with underlying atrial arrhythmia. Moderate left atrial enlargement. MAC with mild mitral regurgitation. Mechanical prosthesis in aortic position with grossly normal function and trivial regurgitation. Peak gradient 17 mmHg. Device wire noted within the right heart. Mild  tricuspid regurgitation with PASP 26 mmHg.  Assessment and Plan:  1.  Nonischemic cardiomyopathy with LVEF approximately 50%.  He is doing well on medical therapy in terms of chronic combined heart failure symptoms.  His weight is down and he still uses Lasix on an as-needed basis.  If he is not able to obtain Toprol-XL due to lack of insurance coverage, we could switch to Lopressor but I would probably go with 100 mg 3 times daily.  2.  Paroxysmal atrial fibrillation/flutter.  He reports no palpitations and is on Coumadin.  3.  Aortic valve disease status post Bentall procedure with mechanical AVR.  He continues on Coumadin which is followed by Dr. Manuella Ghazi.  Current medicines were reviewed with the patient today.   Orders Placed This Encounter  Procedures  . EKG 12-Lead    Disposition: Follow-up in 6 months.  Signed, Satira Sark, MD,  [facility] 06/20/2017 4:22 PM    [facility] [address], [place], [place] [address] Phone: [phone]; Fax: [phone]

## 2017-06-20 ENCOUNTER — Ambulatory Visit (INDEPENDENT_AMBULATORY_CARE_PROVIDER_SITE_OTHER): Payer: Medicare HMO | Admitting: Cardiology

## 2017-06-20 ENCOUNTER — Encounter: Payer: Self-pay | Admitting: Cardiology

## 2017-06-20 ENCOUNTER — Other Ambulatory Visit: Payer: Self-pay

## 2017-06-20 VITALS — BP 128/78 | HR 81 | Ht 68.0 in | Wt 211.0 lb

## 2017-06-20 DIAGNOSIS — I5042 Chronic combined systolic (congestive) and diastolic (congestive) heart failure: Secondary | ICD-10-CM

## 2017-06-20 DIAGNOSIS — Z954 Presence of other heart-valve replacement: Secondary | ICD-10-CM | POA: Diagnosis not present

## 2017-06-20 DIAGNOSIS — I428 Other cardiomyopathies: Secondary | ICD-10-CM

## 2017-06-20 DIAGNOSIS — I480 Paroxysmal atrial fibrillation: Secondary | ICD-10-CM | POA: Diagnosis not present

## 2017-06-20 NOTE — Patient Instructions (Signed)

## 2017-07-06 ENCOUNTER — Encounter: Payer: Medicare HMO | Admitting: *Deleted

## 2017-07-06 ENCOUNTER — Telehealth: Payer: Self-pay

## 2017-07-06 NOTE — Telephone Encounter (Signed)
I spoke with the patient about this.

## 2017-07-07 ENCOUNTER — Encounter: Payer: Self-pay | Admitting: Cardiology

## 2017-07-11 ENCOUNTER — Ambulatory Visit (INDEPENDENT_AMBULATORY_CARE_PROVIDER_SITE_OTHER): Payer: Medicare HMO | Admitting: *Deleted

## 2017-07-11 DIAGNOSIS — I428 Other cardiomyopathies: Secondary | ICD-10-CM | POA: Diagnosis not present

## 2017-07-11 DIAGNOSIS — Z95810 Presence of automatic (implantable) cardiac defibrillator: Secondary | ICD-10-CM

## 2017-07-11 DIAGNOSIS — I5042 Chronic combined systolic (congestive) and diastolic (congestive) heart failure: Secondary | ICD-10-CM | POA: Diagnosis not present

## 2017-07-11 NOTE — Progress Notes (Signed)
Remote ICD transmission.   

## 2017-07-13 NOTE — Progress Notes (Signed)
EPIC Encounter for ICM Monitoring  Patient Name: Timothy Fritz. is a 77 y.o. male Date: 07/13/2017 Primary Care Physican: [name], MD Primary Cardiologist:McDowell Electrophysiologist: Allred Dry Weight:Previous weight 207lbs Bi-V Pacing: 92% AT/AF Burden > 99%   Heart Failure questions reviewed, pt reported feeling dizzy and lightheaded yesterday while working outside.  Explained transmission is suggesting dryness and advised him to increase fluids for the next few days.  Reviewed dehydration symptoms.  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 Advised during hot days to to take breaks and drink plenty of water.   Thoracic impedance abnormal suggesting dryness since 06/30/2017.  Prescribed dosage: Furosemide 20 mg 1 tablet as needed  Recommendations:  Increased to drink at least 64 oz a day and for increase that amount by 12-24 oz for the next couple of days tto help with getting hydrated.    Follow-up plan: ICM clinic phone appointment on 08/14/2017.   Copy of ICM check sent to Dr. Rayann Heman.   3 month ICM trend: 07/08/2017    Direct Trend Viewer through 07/13/2017      1 Year ICM trend:       [name], RN 07/13/2017 3:22 PM

## 2017-07-14 ENCOUNTER — Encounter: Payer: Self-pay | Admitting: Cardiology

## 2017-07-24 DIAGNOSIS — I10 Essential (primary) hypertension: Secondary | ICD-10-CM | POA: Diagnosis not present

## 2017-07-24 DIAGNOSIS — I428 Other cardiomyopathies: Secondary | ICD-10-CM | POA: Diagnosis not present

## 2017-07-24 DIAGNOSIS — I509 Heart failure, unspecified: Secondary | ICD-10-CM | POA: Diagnosis not present

## 2017-07-24 DIAGNOSIS — I4891 Unspecified atrial fibrillation: Secondary | ICD-10-CM | POA: Diagnosis not present

## 2017-07-24 DIAGNOSIS — Z6832 Body mass index (BMI) 32.0-32.9, adult: Secondary | ICD-10-CM | POA: Diagnosis not present

## 2017-07-24 DIAGNOSIS — M722 Plantar fascial fibromatosis: Secondary | ICD-10-CM | POA: Diagnosis not present

## 2017-07-24 DIAGNOSIS — Z299 Encounter for prophylactic measures, unspecified: Secondary | ICD-10-CM | POA: Diagnosis not present

## 2017-07-27 LAB — CUP PACEART REMOTE DEVICE CHECK
Battery Voltage: 2.87 V
HighPow Impedance: 70 Ohm
HighPow Impedance: 70 Ohm
Implantable Lead Implant Date: 20140529
Implantable Lead Location: 753858
Implantable Lead Location: 753859
Implantable Lead Location: 753860
Implantable Pulse Generator Implant Date: 20140529
Lead Channel Pacing Threshold Amplitude: 0.875 V
Lead Channel Pacing Threshold Amplitude: 1 V
Lead Channel Pacing Threshold Pulse Width: 0.5 ms
Lead Channel Pacing Threshold Pulse Width: 0.5 ms
Lead Channel Sensing Intrinsic Amplitude: 1.6 mV
Lead Channel Sensing Intrinsic Amplitude: 11.7 mV
Lead Channel Setting Pacing Amplitude: 2 V
Lead Channel Setting Pacing Pulse Width: 0.5 ms
Lead Channel Setting Sensing Sensitivity: 0.5 mV
MDC IDC LEAD IMPLANT DT: 20140529
MDC IDC LEAD IMPLANT DT: 20140529
MDC IDC MSMT BATTERY REMAINING LONGEVITY: 28 mo
MDC IDC MSMT BATTERY REMAINING PERCENTAGE: 32 %
MDC IDC MSMT LEADCHNL LV IMPEDANCE VALUE: 710 Ohm
MDC IDC MSMT LEADCHNL RA IMPEDANCE VALUE: 380 Ohm
MDC IDC MSMT LEADCHNL RV IMPEDANCE VALUE: 440 Ohm
MDC IDC PG SERIAL: 7097390
MDC IDC SESS DTM: 20190525054211
MDC IDC SET LEADCHNL LV PACING AMPLITUDE: 2 V
MDC IDC SET LEADCHNL RV PACING PULSEWIDTH: 0.5 ms

## 2017-08-14 ENCOUNTER — Ambulatory Visit (INDEPENDENT_AMBULATORY_CARE_PROVIDER_SITE_OTHER): Payer: Medicare HMO

## 2017-08-14 DIAGNOSIS — I5042 Chronic combined systolic (congestive) and diastolic (congestive) heart failure: Secondary | ICD-10-CM | POA: Diagnosis not present

## 2017-08-14 DIAGNOSIS — Z95810 Presence of automatic (implantable) cardiac defibrillator: Secondary | ICD-10-CM

## 2017-08-14 NOTE — Progress Notes (Signed)
EPIC Encounter for ICM Monitoring  Patient Name: Timothy Fritz. is a 77 y.o. male Date: 08/14/2017 Primary Care Physican: [name], MD Primary Cardiologist:McDowell Electrophysiologist: Allred Dry Weight:204lbs Bi-V Pacing: 92% AT/AF Burden >99%       Heart Failure questions reviewed, pt asymptomatic.  He is feeling fine.  Explained there is pattern of fluid retention since 07/15/2017 which is a little unusual for him.  He has been working outside everyday and drinking more fluids.  Advised if he is inside to limit to 64 oz a day but if he is in the heat to drink extra to stay hydrated.      Thoracic impedance abnormal suggesting fluid accumulation for the last few days but had several episodes of fluid retention through month of June.  Prescribed dosage: Furosemide 20 mg 1 tablet as needed  Recommendations:  Advised to take PRN Furosemide 1 tablet x 2 days.  Advised to limit salt intake  Follow-up plan: ICM clinic phone appointment on 08/18/2017 (manual send).    Copy of ICM check sent to Dr. Rayann Heman and Dr. Domenic Polite.   3 month ICM trend: 08/14/2017    1 Year ICM trend:       [name], RN 08/14/2017 4:58 PM

## 2017-08-18 ENCOUNTER — Telehealth: Payer: Self-pay | Admitting: Cardiology

## 2017-08-18 ENCOUNTER — Ambulatory Visit (INDEPENDENT_AMBULATORY_CARE_PROVIDER_SITE_OTHER): Payer: Self-pay

## 2017-08-18 DIAGNOSIS — Z95810 Presence of automatic (implantable) cardiac defibrillator: Secondary | ICD-10-CM

## 2017-08-18 DIAGNOSIS — I5042 Chronic combined systolic (congestive) and diastolic (congestive) heart failure: Secondary | ICD-10-CM

## 2017-08-18 NOTE — Telephone Encounter (Signed)
Spoke with pt and reminded pt of remote transmission that is due today. Pt verbalized understanding.   

## 2017-08-18 NOTE — Progress Notes (Signed)
EPIC Encounter for ICM Monitoring  Patient Name: Timothy Fritz. is a 77 y.o. male Date: 08/18/2017 Primary Care Physican: [name], MD Primary Cardiologist:McDowell Electrophysiologist: Allred Dry Weight:204lbs Bi-V Pacing: 92% AT/AF Burden >99%      Heart Failure questions reviewed, pt asymptomatic.   Thoracic impedance returned to normal after taking PRN Furosemide x 2 days.  Prescribed dosage: Furosemide 20 mg 1 tablet as needed  Recommendations: No changes.  Encouraged to call for fluid symptoms.  Follow-up plan: ICM clinic phone appointment on 10/19/2017.    Appointments: Office appointment scheduled 09/15/2017 with Dr. Rayann Heman.  Copy of ICM check sent to Dr. Rayann Heman.   3 month ICM trend: 08/18/2017    1 Year ICM trend:       [name], RN 08/18/2017 3:27 PM

## 2017-08-26 ENCOUNTER — Other Ambulatory Visit: Payer: Self-pay | Admitting: Cardiology

## 2017-08-28 DIAGNOSIS — I4891 Unspecified atrial fibrillation: Secondary | ICD-10-CM | POA: Diagnosis not present

## 2017-08-28 DIAGNOSIS — Z299 Encounter for prophylactic measures, unspecified: Secondary | ICD-10-CM | POA: Diagnosis not present

## 2017-08-28 DIAGNOSIS — Z6832 Body mass index (BMI) 32.0-32.9, adult: Secondary | ICD-10-CM | POA: Diagnosis not present

## 2017-08-28 DIAGNOSIS — I10 Essential (primary) hypertension: Secondary | ICD-10-CM | POA: Diagnosis not present

## 2017-08-28 DIAGNOSIS — I509 Heart failure, unspecified: Secondary | ICD-10-CM | POA: Diagnosis not present

## 2017-09-04 DIAGNOSIS — K429 Umbilical hernia without obstruction or gangrene: Secondary | ICD-10-CM | POA: Diagnosis not present

## 2017-09-11 DIAGNOSIS — I10 Essential (primary) hypertension: Secondary | ICD-10-CM | POA: Diagnosis not present

## 2017-09-11 DIAGNOSIS — I4891 Unspecified atrial fibrillation: Secondary | ICD-10-CM | POA: Diagnosis not present

## 2017-09-11 DIAGNOSIS — Z6832 Body mass index (BMI) 32.0-32.9, adult: Secondary | ICD-10-CM | POA: Diagnosis not present

## 2017-09-11 DIAGNOSIS — Z299 Encounter for prophylactic measures, unspecified: Secondary | ICD-10-CM | POA: Diagnosis not present

## 2017-09-11 DIAGNOSIS — I509 Heart failure, unspecified: Secondary | ICD-10-CM | POA: Diagnosis not present

## 2017-09-11 DIAGNOSIS — Z952 Presence of prosthetic heart valve: Secondary | ICD-10-CM | POA: Diagnosis not present

## 2017-09-13 DIAGNOSIS — I509 Heart failure, unspecified: Secondary | ICD-10-CM | POA: Diagnosis not present

## 2017-09-13 DIAGNOSIS — E7800 Pure hypercholesterolemia, unspecified: Secondary | ICD-10-CM | POA: Diagnosis not present

## 2017-09-14 HISTORY — PX: UMBILICAL HERNIA REPAIR: SHX196

## 2017-09-15 ENCOUNTER — Encounter: Payer: Self-pay | Admitting: Internal Medicine

## 2017-09-15 ENCOUNTER — Ambulatory Visit: Payer: Medicare Other | Admitting: Internal Medicine

## 2017-09-15 VITALS — BP 118/74 | HR 80 | Ht 68.0 in | Wt 207.2 lb

## 2017-09-15 DIAGNOSIS — I5022 Chronic systolic (congestive) heart failure: Secondary | ICD-10-CM

## 2017-09-15 DIAGNOSIS — I10 Essential (primary) hypertension: Secondary | ICD-10-CM

## 2017-09-15 DIAGNOSIS — Z95810 Presence of automatic (implantable) cardiac defibrillator: Secondary | ICD-10-CM | POA: Diagnosis not present

## 2017-09-15 DIAGNOSIS — I428 Other cardiomyopathies: Secondary | ICD-10-CM

## 2017-09-15 NOTE — Patient Instructions (Signed)
Medication Instructions:  Continue all current medications.  Labwork: none  Testing/Procedures: none  Follow-Up: 1 year   Any Other Special Instructions Will Be Listed Below (If Applicable). Remote monitoring is used to monitor your Pacemaker of ICD from home. This monitoring reduces the number of office visits required to check your device to one time per year. It allows [place] to keep an eye on the functioning of your device to ensure it is working properly. You are scheduled for a device check from home on 10/19/2017. You may send your transmission at any time that day. If you have a wireless device, the transmission will be sent automatically. After your physician reviews your transmission, you will receive a postcard with your next transmission date.  If you need a refill on your cardiac medications before your next appointment, please call your pharmacy.

## 2017-09-15 NOTE — Progress Notes (Signed)
PCP: [name], MD Primary Cardiologist: Dr Domenic Polite Primary EP: Dr Rayann Heman  [name] is a 77 y.o. male who presents today for routine electrophysiology followup.  Since last being seen in our clinic, the patient reports doing very well.  Today, he denies symptoms of palpitations, chest pain, shortness of breath,  lower extremity edema, dizziness, presyncope, syncope, or ICD shocks.  The patient is otherwise without complaint today.   Past Medical History:  Diagnosis Date  . Aortic aneurysm, thoracic ([place])    Fusiform s/p Bentall procedure 2002  . Aortic valve, bicuspid    Mechanical AVR  . Arthritis   . Basal cell carcinoma of face   . BPH (benign prostatic hypertrophy)   . Coronary atherosclerosis of native coronary artery    Nonobstructive at cardiac catherization 2002  . Essential hypertension, benign   . Left bundle branch block   . Mixed hyperlipidemia   . Nonischemic cardiomyopathy (HCC)    CRT-D  . Persistent atrial fibrillation [facility])    Past Surgical History:  Procedure Laterality Date  . BENTALL PROCEDURE     Dr Cyndia Bent 2002  . BI-VENTRICULAR IMPLANTABLE CARDIOVERTER DEFIBRILLATOR N/A 07/12/2012   Procedure: BI-VENTRICULAR IMPLANTABLE CARDIOVERTER DEFIBRILLATOR  (CRT-D);  Surgeon: [name], MD;  Location[facility] CATH LAB;  Service: Cardiovascular;  Laterality: N/A;  . BI-VENTRICULAR IMPLANTABLE CARDIOVERTER DEFIBRILLATOR  (CRT-D)  07/11/2012   [facility]- Dr. Rayann Heman  . CARDIAC VALVE REPLACEMENT    . CATARACT EXTRACTION Left 01/21/13  . COLONOSCOPY N/A 12/01/2016   Procedure: COLONOSCOPY;  Surgeon: Rogene Houston, MD;  Location[facility]O [facility];  Service: Endoscopy;  Laterality: N/A;  2:00  . TONSILLECTOMY AND ADENOIDECTOMY  1949    ROS- all systems are reviewed and negative except as per HPI above  Current Outpatient Medications  Medication Sig Dispense Refill  . atorvastatin (LIPITOR) 10 MG tablet Take 10 mg by mouth daily.    .  cholecalciferol (VITAMIN D) 1000 units tablet Take 1,000 Units by mouth daily.    . furosemide (LASIX) 20 MG tablet Take 20 mg by mouth daily as needed for fluid or edema.     . hydrocortisone cream 1 % Apply 1 application topically daily as needed for itching.    . losartan (COZAAR) 50 MG tablet Take 1 tablet (50 mg total) by mouth daily. (Patient taking differently: Take 50 mg by mouth every evening. ) 90 tablet 3  . meclizine (ANTIVERT) 25 MG tablet Take 25 mg by mouth 3 (three) times daily as needed for dizziness.    . metoprolol succinate (TOPROL-XL) 100 MG 24 hr tablet TAKE 1 AND 1/2 TABLETS BY MOUTH TWICE DAILY 270 tablet 1  . metroNIDAZOLE (METROCREAM) 0.75 % cream Apply 1 application topically daily as needed (rosacea).    . Omega-3 Fatty Acids (FISH OIL) 1000 MG CAPS Take 1,000 mg by mouth daily.     Marland Kitchen oxymetazoline (AFRIN) 0.05 % nasal spray Place 1 spray into both nostrils daily as needed for congestion.    . phenylephrine (NEO-SYNEPHRINE) 1 % nasal spray Place 1 drop into both nostrils daily as needed for congestion.    . Tamsulosin HCl (FLOMAX) 0.4 MG CAPS Take 0.4 mg by mouth daily after supper.     . vitamin C (ASCORBIC ACID) 500 MG tablet Take 500 mg by mouth daily.    Marland Kitchen warfarin (COUMADIN) 5 MG tablet Take 2.5-5 mg by mouth every evening. Take 2.5 mg on Monday and Friday, on  all other days take 5mg  daily     No current facility-administered medications for this visit.     Physical Exam: Vitals:   09/15/17 1151  BP: 118/74  Pulse: 80  SpO2: 96%  Weight: 207 lb 3.2 oz (94 kg)  Height: 5\' 8"  (1.727 m)    GEN- The patient is well appearing, alert and oriented x 3 today.   Head- normocephalic, atraumatic Eyes-  Sclera clear, conjunctiva pink Ears- hearing intact Oropharynx- clear Lungs- Clear to ausculation bilaterally, normal work of breathing Chest- ICD pocket is well healed Heart- Regular rate and rhythm, no murmurs, rubs or gallops, PMI not laterally displaced GI-  soft, NT, ND, + BS Extremities- no clubbing, cyanosis, or edema  ICD interrogation- reviewed in detail today,  See PACEART report  ekg tracing ordered today is personally reviewed and shows afib, BiV paced  Wt Readings from Last 3 Encounters:  09/15/17 207 lb 3.2 oz (94 kg)  06/20/17 211 lb (95.7 kg)  12/01/16 197 lb (89.4 kg)    Assessment and Plan:  1.  Chronic systolic dysfunction euvolemic today Stable on an appropriate medical regimen Normal ICD function See Pace Art report No changes today followed in ICM device clinic BiV paced 92%  2. Mechanical AVR On coumadin  3. HTN Stable No change required today  4. Pernanent afib Rate controlled On coumadin  [place] Follow-up with Dr Domenic Polite as scheduled I will see again in 1 year  [name] MD, [facility] 09/15/2017 12:23 PM

## 2017-09-25 DIAGNOSIS — M1990 Unspecified osteoarthritis, unspecified site: Secondary | ICD-10-CM | POA: Diagnosis not present

## 2017-09-25 DIAGNOSIS — Z952 Presence of prosthetic heart valve: Secondary | ICD-10-CM | POA: Diagnosis not present

## 2017-09-25 DIAGNOSIS — Z95810 Presence of automatic (implantable) cardiac defibrillator: Secondary | ICD-10-CM | POA: Diagnosis not present

## 2017-09-25 DIAGNOSIS — I4891 Unspecified atrial fibrillation: Secondary | ICD-10-CM | POA: Diagnosis not present

## 2017-09-25 DIAGNOSIS — I509 Heart failure, unspecified: Secondary | ICD-10-CM | POA: Diagnosis not present

## 2017-09-25 DIAGNOSIS — I110 Hypertensive heart disease with heart failure: Secondary | ICD-10-CM | POA: Diagnosis not present

## 2017-09-25 DIAGNOSIS — E7800 Pure hypercholesterolemia, unspecified: Secondary | ICD-10-CM | POA: Diagnosis not present

## 2017-09-25 DIAGNOSIS — K429 Umbilical hernia without obstruction or gangrene: Secondary | ICD-10-CM | POA: Diagnosis not present

## 2017-09-25 DIAGNOSIS — N400 Enlarged prostate without lower urinary tract symptoms: Secondary | ICD-10-CM | POA: Diagnosis not present

## 2017-09-26 DIAGNOSIS — E7800 Pure hypercholesterolemia, unspecified: Secondary | ICD-10-CM | POA: Diagnosis not present

## 2017-09-26 DIAGNOSIS — Z950 Presence of cardiac pacemaker: Secondary | ICD-10-CM | POA: Diagnosis not present

## 2017-09-26 DIAGNOSIS — I4891 Unspecified atrial fibrillation: Secondary | ICD-10-CM | POA: Diagnosis not present

## 2017-09-26 DIAGNOSIS — I509 Heart failure, unspecified: Secondary | ICD-10-CM | POA: Diagnosis not present

## 2017-09-26 DIAGNOSIS — Z952 Presence of prosthetic heart valve: Secondary | ICD-10-CM | POA: Diagnosis not present

## 2017-09-26 DIAGNOSIS — I110 Hypertensive heart disease with heart failure: Secondary | ICD-10-CM | POA: Diagnosis not present

## 2017-09-26 DIAGNOSIS — M1990 Unspecified osteoarthritis, unspecified site: Secondary | ICD-10-CM | POA: Diagnosis not present

## 2017-09-26 DIAGNOSIS — K429 Umbilical hernia without obstruction or gangrene: Secondary | ICD-10-CM | POA: Diagnosis not present

## 2017-09-26 DIAGNOSIS — Z95810 Presence of automatic (implantable) cardiac defibrillator: Secondary | ICD-10-CM | POA: Diagnosis not present

## 2017-09-26 DIAGNOSIS — N400 Enlarged prostate without lower urinary tract symptoms: Secondary | ICD-10-CM | POA: Diagnosis not present

## 2017-10-03 DIAGNOSIS — Z6832 Body mass index (BMI) 32.0-32.9, adult: Secondary | ICD-10-CM | POA: Diagnosis not present

## 2017-10-03 DIAGNOSIS — I509 Heart failure, unspecified: Secondary | ICD-10-CM | POA: Diagnosis not present

## 2017-10-03 DIAGNOSIS — Z299 Encounter for prophylactic measures, unspecified: Secondary | ICD-10-CM | POA: Diagnosis not present

## 2017-10-03 DIAGNOSIS — I428 Other cardiomyopathies: Secondary | ICD-10-CM | POA: Diagnosis not present

## 2017-10-03 DIAGNOSIS — I10 Essential (primary) hypertension: Secondary | ICD-10-CM | POA: Diagnosis not present

## 2017-10-03 DIAGNOSIS — I4891 Unspecified atrial fibrillation: Secondary | ICD-10-CM | POA: Diagnosis not present

## 2017-10-04 DIAGNOSIS — E7800 Pure hypercholesterolemia, unspecified: Secondary | ICD-10-CM | POA: Diagnosis not present

## 2017-10-04 DIAGNOSIS — I509 Heart failure, unspecified: Secondary | ICD-10-CM | POA: Diagnosis not present

## 2017-10-07 DIAGNOSIS — Z950 Presence of cardiac pacemaker: Secondary | ICD-10-CM | POA: Diagnosis not present

## 2017-10-07 DIAGNOSIS — R42 Dizziness and giddiness: Secondary | ICD-10-CM | POA: Diagnosis not present

## 2017-10-07 DIAGNOSIS — I509 Heart failure, unspecified: Secondary | ICD-10-CM | POA: Diagnosis not present

## 2017-10-07 DIAGNOSIS — Z6830 Body mass index (BMI) 30.0-30.9, adult: Secondary | ICD-10-CM | POA: Diagnosis not present

## 2017-10-07 DIAGNOSIS — I110 Hypertensive heart disease with heart failure: Secondary | ICD-10-CM | POA: Diagnosis not present

## 2017-10-07 DIAGNOSIS — N400 Enlarged prostate without lower urinary tract symptoms: Secondary | ICD-10-CM | POA: Diagnosis not present

## 2017-10-07 DIAGNOSIS — E785 Hyperlipidemia, unspecified: Secondary | ICD-10-CM | POA: Diagnosis not present

## 2017-10-07 DIAGNOSIS — Z7901 Long term (current) use of anticoagulants: Secondary | ICD-10-CM | POA: Diagnosis not present

## 2017-10-07 DIAGNOSIS — I4891 Unspecified atrial fibrillation: Secondary | ICD-10-CM | POA: Diagnosis not present

## 2017-10-08 LAB — CUP PACEART INCLINIC DEVICE CHECK
Battery Remaining Longevity: 22 mo
Brady Statistic RA Percent Paced: 0 %
Date Time Interrogation Session: 20190802155342
HIGH POWER IMPEDANCE MEASURED VALUE: 67.5 Ohm
Implantable Lead Implant Date: 20140529
Implantable Lead Implant Date: 20140529
Implantable Lead Implant Date: 20140529
Implantable Lead Location: 753858
Implantable Lead Location: 753860
Lead Channel Impedance Value: 362.5 Ohm
Lead Channel Impedance Value: 425 Ohm
Lead Channel Impedance Value: 700 Ohm
Lead Channel Pacing Threshold Amplitude: 0.875 V
Lead Channel Sensing Intrinsic Amplitude: 11.7 mV
Lead Channel Setting Pacing Amplitude: 2 V
MDC IDC LEAD LOCATION: 753859
MDC IDC MSMT LEADCHNL LV PACING THRESHOLD AMPLITUDE: 0.875 V
MDC IDC MSMT LEADCHNL LV PACING THRESHOLD PULSEWIDTH: 0.5 ms
MDC IDC MSMT LEADCHNL RV PACING THRESHOLD PULSEWIDTH: 0.5 ms
MDC IDC PG IMPLANT DT: 20140529
MDC IDC SET LEADCHNL LV PACING AMPLITUDE: 2 V
MDC IDC SET LEADCHNL LV PACING PULSEWIDTH: 0.5 ms
MDC IDC SET LEADCHNL RV PACING PULSEWIDTH: 0.5 ms
MDC IDC SET LEADCHNL RV SENSING SENSITIVITY: 0.5 mV
Pulse Gen Serial Number: 7097390

## 2017-10-17 DIAGNOSIS — Z299 Encounter for prophylactic measures, unspecified: Secondary | ICD-10-CM | POA: Diagnosis not present

## 2017-10-17 DIAGNOSIS — I428 Other cardiomyopathies: Secondary | ICD-10-CM | POA: Diagnosis not present

## 2017-10-17 DIAGNOSIS — I10 Essential (primary) hypertension: Secondary | ICD-10-CM | POA: Diagnosis not present

## 2017-10-17 DIAGNOSIS — Z6832 Body mass index (BMI) 32.0-32.9, adult: Secondary | ICD-10-CM | POA: Diagnosis not present

## 2017-10-17 DIAGNOSIS — I509 Heart failure, unspecified: Secondary | ICD-10-CM | POA: Diagnosis not present

## 2017-10-17 DIAGNOSIS — I4891 Unspecified atrial fibrillation: Secondary | ICD-10-CM | POA: Diagnosis not present

## 2017-10-19 ENCOUNTER — Ambulatory Visit (INDEPENDENT_AMBULATORY_CARE_PROVIDER_SITE_OTHER): Payer: Medicare HMO | Admitting: *Deleted

## 2017-10-19 ENCOUNTER — Ambulatory Visit (INDEPENDENT_AMBULATORY_CARE_PROVIDER_SITE_OTHER): Payer: Medicare HMO

## 2017-10-19 DIAGNOSIS — Z95810 Presence of automatic (implantable) cardiac defibrillator: Secondary | ICD-10-CM | POA: Diagnosis not present

## 2017-10-19 DIAGNOSIS — I5022 Chronic systolic (congestive) heart failure: Secondary | ICD-10-CM | POA: Diagnosis not present

## 2017-10-19 DIAGNOSIS — I428 Other cardiomyopathies: Secondary | ICD-10-CM | POA: Diagnosis not present

## 2017-10-19 NOTE — Progress Notes (Signed)
Remote ICD transmission.   

## 2017-10-20 NOTE — Progress Notes (Signed)
EPIC Encounter for ICM Monitoring  Patient Name: Timothy Fritz. is a 77 y.o. male Date: 10/20/2017 Primary Care Physican: [name], MD Primary Cardiologist:McDowell Electrophysiologist: Allred Dry Weight:206lbs Bi-V Pacing: 92% AT/AF Burden >99%      Heart Failure questions reviewed, pt reported tightness of his ring and this is how he can measure if he has fluid.   He took PRN Furosemide yesterday and ring is loose today.     Thoracic impedance abnormal suggesting fluid accumulation but is back to baseline 10/19/2017.  Prescribed dosage: Furosemide 20 mg 1 tablet as needed  Recommendations: No changes.   Encouraged to call for fluid symptoms.  Follow-up plan: ICM clinic phone appointment on 11/20/2017.    Copy of ICM check sent to Dr. Rayann Heman.   3 month ICM trend: 10/19/2017    1 Year ICM trend:       Timothy Billings, RN 10/20/2017 1:01 PM

## 2017-10-30 DIAGNOSIS — N390 Urinary tract infection, site not specified: Secondary | ICD-10-CM | POA: Diagnosis not present

## 2017-10-30 DIAGNOSIS — R509 Fever, unspecified: Secondary | ICD-10-CM | POA: Diagnosis not present

## 2017-10-30 DIAGNOSIS — I110 Hypertensive heart disease with heart failure: Secondary | ICD-10-CM | POA: Diagnosis not present

## 2017-10-30 DIAGNOSIS — Z79899 Other long term (current) drug therapy: Secondary | ICD-10-CM | POA: Diagnosis not present

## 2017-10-30 DIAGNOSIS — N400 Enlarged prostate without lower urinary tract symptoms: Secondary | ICD-10-CM | POA: Diagnosis not present

## 2017-10-30 DIAGNOSIS — I509 Heart failure, unspecified: Secondary | ICD-10-CM | POA: Diagnosis not present

## 2017-10-30 DIAGNOSIS — E785 Hyperlipidemia, unspecified: Secondary | ICD-10-CM | POA: Diagnosis not present

## 2017-10-30 DIAGNOSIS — R079 Chest pain, unspecified: Secondary | ICD-10-CM | POA: Diagnosis not present

## 2017-10-30 DIAGNOSIS — Z7901 Long term (current) use of anticoagulants: Secondary | ICD-10-CM | POA: Diagnosis not present

## 2017-10-30 DIAGNOSIS — I4891 Unspecified atrial fibrillation: Secondary | ICD-10-CM | POA: Diagnosis not present

## 2017-10-30 DIAGNOSIS — M791 Myalgia, unspecified site: Secondary | ICD-10-CM | POA: Diagnosis not present

## 2017-10-31 DIAGNOSIS — R509 Fever, unspecified: Secondary | ICD-10-CM | POA: Diagnosis not present

## 2017-10-31 DIAGNOSIS — R079 Chest pain, unspecified: Secondary | ICD-10-CM | POA: Diagnosis not present

## 2017-11-02 DIAGNOSIS — Z299 Encounter for prophylactic measures, unspecified: Secondary | ICD-10-CM | POA: Diagnosis not present

## 2017-11-02 DIAGNOSIS — Z6831 Body mass index (BMI) 31.0-31.9, adult: Secondary | ICD-10-CM | POA: Diagnosis not present

## 2017-11-02 DIAGNOSIS — I509 Heart failure, unspecified: Secondary | ICD-10-CM | POA: Diagnosis not present

## 2017-11-02 DIAGNOSIS — N390 Urinary tract infection, site not specified: Secondary | ICD-10-CM | POA: Diagnosis not present

## 2017-11-02 DIAGNOSIS — I4891 Unspecified atrial fibrillation: Secondary | ICD-10-CM | POA: Diagnosis not present

## 2017-11-02 DIAGNOSIS — I10 Essential (primary) hypertension: Secondary | ICD-10-CM | POA: Diagnosis not present

## 2017-11-05 ENCOUNTER — Inpatient Hospital Stay (HOSPITAL_COMMUNITY)
Admission: AD | Admit: 2017-11-05 | Discharge: 2017-11-18 | DRG: 357 | Disposition: A | Discharge status: Home / Self Care | Payer: Medicare HMO | Source: Other Acute Inpatient Hospital | Attending: Internal Medicine | Admitting: Internal Medicine

## 2017-11-05 DIAGNOSIS — N390 Urinary tract infection, site not specified: Secondary | ICD-10-CM | POA: Diagnosis not present

## 2017-11-05 DIAGNOSIS — Z954 Presence of other heart-valve replacement: Secondary | ICD-10-CM | POA: Diagnosis not present

## 2017-11-05 DIAGNOSIS — I428 Other cardiomyopathies: Secondary | ICD-10-CM | POA: Diagnosis not present

## 2017-11-05 DIAGNOSIS — K264 Chronic or unspecified duodenal ulcer with hemorrhage: Secondary | ICD-10-CM | POA: Diagnosis not present

## 2017-11-05 DIAGNOSIS — E861 Hypovolemia: Secondary | ICD-10-CM | POA: Diagnosis not present

## 2017-11-05 DIAGNOSIS — I493 Ventricular premature depolarization: Secondary | ICD-10-CM | POA: Diagnosis not present

## 2017-11-05 DIAGNOSIS — E7800 Pure hypercholesterolemia, unspecified: Secondary | ICD-10-CM | POA: Diagnosis not present

## 2017-11-05 DIAGNOSIS — I340 Nonrheumatic mitral (valve) insufficiency: Secondary | ICD-10-CM | POA: Diagnosis not present

## 2017-11-05 DIAGNOSIS — R740 Nonspecific elevation of levels of transaminase and lactic acid dehydrogenase [LDH]: Secondary | ICD-10-CM | POA: Diagnosis not present

## 2017-11-05 DIAGNOSIS — T82198A Other mechanical complication of other cardiac electronic device, initial encounter: Secondary | ICD-10-CM | POA: Diagnosis not present

## 2017-11-05 DIAGNOSIS — I080 Rheumatic disorders of both mitral and aortic valves: Secondary | ICD-10-CM | POA: Diagnosis present

## 2017-11-05 DIAGNOSIS — K5731 Diverticulosis of large intestine without perforation or abscess with bleeding: Secondary | ICD-10-CM | POA: Diagnosis not present

## 2017-11-05 DIAGNOSIS — Z85828 Personal history of other malignant neoplasm of skin: Secondary | ICD-10-CM

## 2017-11-05 DIAGNOSIS — R791 Abnormal coagulation profile: Secondary | ICD-10-CM | POA: Diagnosis present

## 2017-11-05 DIAGNOSIS — K284 Chronic or unspecified gastrojejunal ulcer with hemorrhage: Secondary | ICD-10-CM

## 2017-11-05 DIAGNOSIS — I480 Paroxysmal atrial fibrillation: Secondary | ICD-10-CM | POA: Diagnosis not present

## 2017-11-05 DIAGNOSIS — Z79899 Other long term (current) drug therapy: Secondary | ICD-10-CM

## 2017-11-05 DIAGNOSIS — E782 Mixed hyperlipidemia: Secondary | ICD-10-CM | POA: Diagnosis present

## 2017-11-05 DIAGNOSIS — I10 Essential (primary) hypertension: Secondary | ICD-10-CM | POA: Diagnosis not present

## 2017-11-05 DIAGNOSIS — R0902 Hypoxemia: Secondary | ICD-10-CM | POA: Diagnosis not present

## 2017-11-05 DIAGNOSIS — Z95810 Presence of automatic (implantable) cardiac defibrillator: Secondary | ICD-10-CM | POA: Diagnosis not present

## 2017-11-05 DIAGNOSIS — R338 Other retention of urine: Secondary | ICD-10-CM | POA: Diagnosis not present

## 2017-11-05 DIAGNOSIS — Z952 Presence of prosthetic heart valve: Secondary | ICD-10-CM

## 2017-11-05 DIAGNOSIS — K254 Chronic or unspecified gastric ulcer with hemorrhage: Secondary | ICD-10-CM | POA: Diagnosis not present

## 2017-11-05 DIAGNOSIS — Z87891 Personal history of nicotine dependence: Secondary | ICD-10-CM | POA: Diagnosis not present

## 2017-11-05 DIAGNOSIS — K921 Melena: Secondary | ICD-10-CM | POA: Diagnosis not present

## 2017-11-05 DIAGNOSIS — Z7901 Long term (current) use of anticoagulants: Secondary | ICD-10-CM | POA: Diagnosis not present

## 2017-11-05 DIAGNOSIS — N401 Enlarged prostate with lower urinary tract symptoms: Secondary | ICD-10-CM | POA: Diagnosis not present

## 2017-11-05 DIAGNOSIS — K5730 Diverticulosis of large intestine without perforation or abscess without bleeding: Secondary | ICD-10-CM | POA: Diagnosis present

## 2017-11-05 DIAGNOSIS — I110 Hypertensive heart disease with heart failure: Secondary | ICD-10-CM | POA: Diagnosis present

## 2017-11-05 DIAGNOSIS — R231 Pallor: Secondary | ICD-10-CM | POA: Diagnosis not present

## 2017-11-05 DIAGNOSIS — I2510 Atherosclerotic heart disease of native coronary artery without angina pectoris: Secondary | ICD-10-CM | POA: Diagnosis present

## 2017-11-05 DIAGNOSIS — R0689 Other abnormalities of breathing: Secondary | ICD-10-CM | POA: Diagnosis not present

## 2017-11-05 DIAGNOSIS — K644 Residual hemorrhoidal skin tags: Secondary | ICD-10-CM | POA: Diagnosis present

## 2017-11-05 DIAGNOSIS — I4891 Unspecified atrial fibrillation: Secondary | ICD-10-CM | POA: Diagnosis present

## 2017-11-05 DIAGNOSIS — K648 Other hemorrhoids: Secondary | ICD-10-CM | POA: Diagnosis present

## 2017-11-05 DIAGNOSIS — K259 Gastric ulcer, unspecified as acute or chronic, without hemorrhage or perforation: Secondary | ICD-10-CM | POA: Diagnosis not present

## 2017-11-05 DIAGNOSIS — K621 Rectal polyp: Secondary | ICD-10-CM | POA: Diagnosis not present

## 2017-11-05 DIAGNOSIS — D128 Benign neoplasm of rectum: Secondary | ICD-10-CM | POA: Diagnosis present

## 2017-11-05 DIAGNOSIS — K920 Hematemesis: Secondary | ICD-10-CM | POA: Diagnosis not present

## 2017-11-05 DIAGNOSIS — I4821 Permanent atrial fibrillation: Secondary | ICD-10-CM | POA: Diagnosis not present

## 2017-11-05 DIAGNOSIS — K2991 Gastroduodenitis, unspecified, with bleeding: Secondary | ICD-10-CM

## 2017-11-05 DIAGNOSIS — I472 Ventricular tachycardia: Secondary | ICD-10-CM | POA: Diagnosis not present

## 2017-11-05 DIAGNOSIS — E869 Volume depletion, unspecified: Secondary | ICD-10-CM | POA: Diagnosis present

## 2017-11-05 DIAGNOSIS — I481 Persistent atrial fibrillation: Secondary | ICD-10-CM | POA: Diagnosis not present

## 2017-11-05 DIAGNOSIS — I482 Chronic atrial fibrillation: Secondary | ICD-10-CM | POA: Diagnosis not present

## 2017-11-05 DIAGNOSIS — K2971 Gastritis, unspecified, with bleeding: Secondary | ICD-10-CM

## 2017-11-05 DIAGNOSIS — K922 Gastrointestinal hemorrhage, unspecified: Secondary | ICD-10-CM | POA: Diagnosis present

## 2017-11-05 DIAGNOSIS — I9581 Postprocedural hypotension: Secondary | ICD-10-CM | POA: Diagnosis not present

## 2017-11-05 DIAGNOSIS — K269 Duodenal ulcer, unspecified as acute or chronic, without hemorrhage or perforation: Secondary | ICD-10-CM | POA: Diagnosis not present

## 2017-11-05 DIAGNOSIS — D62 Acute posthemorrhagic anemia: Secondary | ICD-10-CM | POA: Diagnosis present

## 2017-11-05 DIAGNOSIS — I5042 Chronic combined systolic (congestive) and diastolic (congestive) heart failure: Secondary | ICD-10-CM | POA: Diagnosis not present

## 2017-11-05 DIAGNOSIS — D123 Benign neoplasm of transverse colon: Secondary | ICD-10-CM | POA: Diagnosis not present

## 2017-11-05 DIAGNOSIS — T849XXA Unspecified complication of internal orthopedic prosthetic device, implant and graft, initial encounter: Secondary | ICD-10-CM | POA: Diagnosis not present

## 2017-11-05 DIAGNOSIS — I499 Cardiac arrhythmia, unspecified: Secondary | ICD-10-CM | POA: Diagnosis not present

## 2017-11-05 HISTORY — DX: Presence of cardiac pacemaker: Z95.0

## 2017-11-05 HISTORY — DX: Presence of automatic (implantable) cardiac defibrillator: Z95.810

## 2017-11-05 LAB — COMPREHENSIVE METABOLIC PANEL
ALBUMIN: 2.6 g/dL — AB (ref 3.5–5.0)
ALK PHOS: 61 U/L (ref 38–126)
ALT: 51 U/L — ABNORMAL HIGH (ref 0–44)
ANION GAP: 9 (ref 5–15)
AST: 94 U/L — ABNORMAL HIGH (ref 15–41)
BUN: 47 mg/dL — ABNORMAL HIGH (ref 8–23)
CALCIUM: 7.7 mg/dL — AB (ref 8.9–10.3)
CHLORIDE: 110 mmol/L (ref 98–111)
CO2: 21 mmol/L — AB (ref 22–32)
Creatinine, Ser: 1.2 mg/dL (ref 0.61–1.24)
GFR calc non Af Amer: 57 mL/min — ABNORMAL LOW (ref >60)
GLUCOSE: 111 mg/dL — AB (ref 70–99)
POTASSIUM: 4.1 mmol/L (ref 3.5–5.1)
SODIUM: 140 mmol/L (ref 135–145)
Total Bilirubin: 0.9 mg/dL (ref 0.3–1.2)
Total Protein: 5.1 g/dL — ABNORMAL LOW (ref 6.5–8.1)

## 2017-11-05 LAB — MRSA PCR SCREENING: MRSA BY PCR: NEGATIVE

## 2017-11-05 LAB — CBC WITH DIFFERENTIAL/PLATELET
Abs Immature Granulocytes: 0.1 10*3/uL (ref 0.0–0.1)
BASOS ABS: 0 10*3/uL (ref 0.0–0.1)
BASOS PCT: 0 %
EOS PCT: 1 %
Eosinophils Absolute: 0.1 10*3/uL (ref 0.0–0.7)
HCT: 29.6 % — ABNORMAL LOW (ref 39.0–52.0)
HEMOGLOBIN: 9.5 g/dL — AB (ref 13.0–17.0)
Immature Granulocytes: 1 %
LYMPHS PCT: 14 %
Lymphs Abs: 1.1 10*3/uL (ref 0.7–4.0)
MCH: 29.3 pg (ref 26.0–34.0)
MCHC: 32.1 g/dL (ref 30.0–36.0)
MCV: 91.4 fL (ref 78.0–100.0)
MONO ABS: 0.9 10*3/uL (ref 0.1–1.0)
Monocytes Relative: 12 %
Neutro Abs: 5.2 10*3/uL (ref 1.7–7.7)
Neutrophils Relative %: 72 %
Platelets: 101 10*3/uL — ABNORMAL LOW (ref 150–400)
RBC: 3.24 MIL/uL — AB (ref 4.22–5.81)
RDW: 13.5 % (ref 11.5–15.5)
WBC: 7.4 10*3/uL (ref 4.0–10.5)

## 2017-11-05 LAB — CBC
HCT: 29.9 % — ABNORMAL LOW (ref 39.0–52.0)
HEMOGLOBIN: 9.9 g/dL — AB (ref 13.0–17.0)
MCH: 30.1 pg (ref 26.0–34.0)
MCHC: 33.1 g/dL (ref 30.0–36.0)
MCV: 90.9 fL (ref 78.0–100.0)
Platelets: 126 10*3/uL — ABNORMAL LOW (ref 150–400)
RBC: 3.29 MIL/uL — ABNORMAL LOW (ref 4.22–5.81)
RDW: 13.5 % (ref 11.5–15.5)
WBC: 8.3 10*3/uL (ref 4.0–10.5)

## 2017-11-05 LAB — PROTIME-INR
INR: 8.35 — AB
Prothrombin Time: 68.8 seconds — ABNORMAL HIGH (ref 11.4–15.2)

## 2017-11-05 LAB — ABO/RH: ABO/RH(D): A POS

## 2017-11-05 LAB — MAGNESIUM: MAGNESIUM: 2 mg/dL (ref 1.7–2.4)

## 2017-11-05 LAB — PHOSPHORUS: PHOSPHORUS: 1.7 mg/dL — AB (ref 2.5–4.6)

## 2017-11-05 MED ORDER — TRAMADOL HCL 50 MG PO TABS
50.0000 mg | ORAL_TABLET | Freq: Four times a day (QID) | ORAL | Status: DC | PRN
  Administered 2017-11-11 (×2): 50 mg via ORAL
  Filled 2017-11-05 (×2): qty 1

## 2017-11-05 MED ORDER — PANTOPRAZOLE SODIUM 40 MG IV SOLR
40.0000 mg | Freq: Two times a day (BID) | INTRAVENOUS | Status: DC
  Administered 2017-11-05 – 2017-11-08 (×7): 40 mg via INTRAVENOUS
  Filled 2017-11-05 (×9): qty 40

## 2017-11-05 MED ORDER — METOPROLOL SUCCINATE ER 50 MG PO TB24
50.0000 mg | ORAL_TABLET | Freq: Two times a day (BID) | ORAL | Status: DC
  Administered 2017-11-05 – 2017-11-06 (×3): 50 mg via ORAL
  Filled 2017-11-05 (×3): qty 1

## 2017-11-05 MED ORDER — SODIUM CHLORIDE 0.9% IV SOLUTION
Freq: Once | INTRAVENOUS | Status: AC
  Administered 2017-11-05: 16:00:00 via INTRAVENOUS

## 2017-11-05 MED ORDER — SENNOSIDES-DOCUSATE SODIUM 8.6-50 MG PO TABS: 1.0000 | ORAL_TABLET | Freq: Every evening | ORAL | Status: DC | PRN

## 2017-11-05 MED ORDER — METOPROLOL TARTRATE 25 MG PO TABS
25.0000 mg | ORAL_TABLET | Freq: Once | ORAL | Status: AC
  Administered 2017-11-05: 25 mg via ORAL
  Filled 2017-11-05: qty 1

## 2017-11-05 NOTE — Consult Note (Signed)
Cardiology Consultation:   Patient ID: Timothy NOONE Sr. MRN: [id number]; DOB: 1940/04/01  Admit date: 11/05/2017 Date of Consult: 11/05/2017  Primary Care Provider: [name], MD Primary Cardiologist: Domenic Polite Primary Electrophysiologist:  [name], MD    Patient Profile:   Timothy DILEONARDO Sr. is a 77 y.o. male with a hx of aortic aneurysm status post Bentall procedure in 2002 with mechanical AVR, coronary artery disease nonobstructive in 2002, hypertension, hyperlipidemia, nonischemic cardiomyopathy status post [facility] CRT-D in 2014, and persistent atrial fibrillation who is being seen today for the evaluation of ICD shocks at the request of Oretha Milch.  History of Present Illness:   Timothy Fritz presented to [facility] after having multiple ICD shocks.  His episode started on Tuesday when he started having abdominal pain.  He went to the emergency room is found to have a UTI and was put on an antibiotic, which he does not know the name.  He continued to have abdominal pain, and started having bloody stools the following day.  Due to the abdominal pain, he has not eaten in the last 4 days and is been drinking minimal amounts.  This morning, he had multiple ICD shocks.  He notes weakness and fatigue prior to the shocks.  He was noted to be in a wide-complex tachycardia in the emergency room and received an external shock which converted him to sinus rhythm.  He is now AV paced with ventricular ectopy.  On presentation, he was found to was found to have hematochezia and the setting of a supratherapeutic INR.  He was put on amiodarone drip, but due to hypotension on transfer, the amiodarone drip was stopped.  Past Medical History:  Diagnosis Date  . Aortic aneurysm, thoracic ([place])    Fusiform s/p Bentall procedure 2002  . Aortic valve, bicuspid    Mechanical AVR  . Arthritis   . Basal cell carcinoma of face   . BPH (benign prostatic hypertrophy)   . Coronary  atherosclerosis of native coronary artery    Nonobstructive at cardiac catherization 2002  . Essential hypertension, benign   . Left bundle branch block   . Mixed hyperlipidemia   . Nonischemic cardiomyopathy (HCC)    CRT-D  . Persistent atrial fibrillation [facility])     Past Surgical History:  Procedure Laterality Date  . BENTALL PROCEDURE     Dr Cyndia Bent 2002  . BI-VENTRICULAR IMPLANTABLE CARDIOVERTER DEFIBRILLATOR N/A 07/12/2012   Procedure: BI-VENTRICULAR IMPLANTABLE CARDIOVERTER DEFIBRILLATOR  (CRT-D);  Surgeon: [name], MD;  Location: [facility] C[facility] LAB;  Service: Cardiovascular;  Laterality: N/A;  . BI-VENTRICULAR IMPLANTABLE CARDIOVERTER DEFIBRILLATOR  (CRT-D)  07/11/2012   [facility]- Dr. Rayann Heman  . CARDIAC VALVE REPLACEMENT    . CATARACT EXTRACTION Left 01/21/13  . COLONOSCOPY N/A 12/01/2016   Procedure: COLONOSCOPY;  Surgeon: Rogene Houston, MD;  Location: [facility] ENDO [facility];  Service: Endoscopy;  Laterality: N/A;  2:00  . TONSILLECTOMY AND ADENOIDECTOMY  1949     Home Medications:  Prior to Admission medications   Medication Sig Start Date End Date Taking? Authorizing Provider  atorvastatin (LIPITOR) 10 MG tablet Take 10 mg by mouth daily.    [provider]  cholecalciferol (VITAMIN D) 1000 units tablet Take 1,000 Units by mouth daily.    [provider]  furosemide (LASIX) 20 MG tablet Take 20 mg by mouth daily as needed for fluid or edema.     [provider]  hydrocortisone cream 1 % Apply  1 application topically daily as needed for itching.    [provider]  losartan (COZAAR) 50 MG tablet Take 1 tablet (50 mg total) by mouth daily. Patient taking differently: Take 50 mg by mouth every evening.  04/23/13   Satira Sark, MD  meclizine (ANTIVERT) 25 MG tablet Take 25 mg by mouth 3 (three) times daily as needed for dizziness.    [provider]  metoprolol succinate (TOPROL-XL) 100 MG 24 hr tablet TAKE 1 AND 1/2  TABLETS BY MOUTH TWICE DAILY 08/28/17   Satira Sark, MD  metroNIDAZOLE (METROCREAM) 0.75 % cream Apply 1 application topically daily as needed (rosacea).    [provider]  Omega-3 Fatty Acids (FISH OIL) 1000 MG CAPS Take 1,000 mg by mouth daily.     [provider]  oxymetazoline (AFRIN) 0.05 % nasal spray Place 1 spray into both nostrils daily as needed for congestion.    [provider]  phenylephrine (NEO-SYNEPHRINE) 1 % nasal spray Place 1 drop into both nostrils daily as needed for congestion.    [provider]  Tamsulosin HCl (FLOMAX) 0.4 MG CAPS Take 0.4 mg by mouth daily after supper.     [provider]  vitamin C (ASCORBIC ACID) 500 MG tablet Take 500 mg by mouth daily.    [provider]  warfarin (COUMADIN) 5 MG tablet Take 2.5-5 mg by mouth every evening. Take 2.5 mg on Monday and Friday, on all other days take 5mg  daily    [provider]    Inpatient Medications: Scheduled Meds: . pantoprazole (PROTONIX) IV  40 mg Intravenous Q12H   Continuous Infusions:  PRN Meds: senna-docusate, traMADol  Allergies:   No Known Allergies  Social History:   Social History   Socioeconomic History  . Marital status: Widowed    Spouse name: Not on file  . Number of children: Not on file  . Years of education: Not on file  . Highest education level: Not on file  Occupational History  . Occupation: Charity fundraiser  Social Needs  . Financial resource strain: Not on file  . Food insecurity:    Worry: Not on file    Inability: Not on file  . Transportation needs:    Medical: Not on file    Non-medical: Not on file  Tobacco Use  . Smoking status: Former Smoker    Packs/day: 1.00    Years: 41.00    Pack years: 41.00    Types: Cigarettes    Last attempt to quit: 02/15/2000    Years since quitting: 17.7  . Smokeless tobacco: Never Used  Substance and Sexual Activity  . Alcohol use: Yes    Alcohol/week: 2.0  standard drinks    Types: 2 Glasses of wine per week    Comment: 07/12/2012 "2-3 glasses twice/month"  . Drug use: No  . Sexual activity: Not on file  Lifestyle  . Physical activity:    Days per week: Not on file    Minutes per session: Not on file  . Stress: Not on file  Relationships  . Social connections:    Talks on phone: Not on file    Gets together: Not on file    Attends religious service: Not on file    Active member of club or organization: Not on file    Attends meetings of clubs or organizations: Not on file    Relationship status: Not on file  . Intimate partner violence:    Fear  of current or ex partner: Not on file    Emotionally abused: Not on file    Physically abused: Not on file    Forced sexual activity: Not on file  Other Topics Concern  . Not on file  Social History Narrative   Widow, Lives in [place]   Regular Exercise --yes   Retired Charity fundraiser   Attends [employer]    Family History:    Family History  Problem Relation Age of Onset  . Coronary artery disease Father      ROS:  Please see the history of present illness.   All other ROS reviewed and negative.     Physical Exam/Data:   Vitals:   11/05/17 1401  BP: 128/72  Pulse: (!) 102  Temp: 98.2 F (36.8 C)  TempSrc: Oral  SpO2: 99%   No intake or output data in the 24 hours ending 11/05/17 1510 There were no vitals filed for this visit. There is no height or weight on file to calculate BMI.  General:  Well nourished, well developed, in no acute distress HEENT: normal Lymph: no adenopathy Neck: no JVD Endocrine:  No thryomegaly Vascular: No carotid bruits; FA pulses 2+ bilaterally without bruits  Cardiac:  normal S1, S2; RRR; no murmur  Lungs:  clear to auscultation bilaterally, no wheezing, rhonchi or rales  Abd: soft, nontender, no hepatomegaly  Ext: no edema Musculoskeletal:  No deformities, BUE and BLE strength normal and equal Skin: warm and dry    Neuro:  CNs 2-12 intact, no focal abnormalities noted Psych:  Normal affect   EKG:  The EKG was personally reviewed and demonstrates:  A sense, V paced with PVCs Telemetry:  Telemetry was personally reviewed and demonstrates: Sinus rhythm with wide complex ectopy  Relevant CV Studies: TTE 10/01/15 - Left ventricle: The cavity size was mildly dilated. Wall   thickness was increased in a pattern of mild LVH. The estimated   ejection fraction was 50%. Diffuse hypokinesis. The study is not   technically sufficient to allow evaluation of LV diastolic   function. - Aortic valve: Mechanical prosthesis in aortic position. There was   trivial regurgitation. Peak gradient (S): 17 mm Hg. - Mitral valve: Calcified annulus. There was mild regurgitation. - Left atrium: The atrium was moderately dilated. - Right ventricle: Pacer wire or catheter noted in right ventricle. - Right atrium: Pacer wire or catheter noted in right atrium.   Central venous pressure (est): 3 mm Hg. - Tricuspid valve: There was mild regurgitation. - Pulmonary arteries: PA peak pressure: 26 mm Hg (S). - Pericardium, extracardiac: There was no pericardial effusion.  Laboratory Data:  ChemistryNo results for input(s): NA, K, CL, CO2, GLUCOSE, BUN, CREATININE, CALCIUM, GFRNONAA, GFRAA, ANIONGAP in the last 168 hours.  No results for input(s): PROT, ALBUMIN, AST, ALT, ALKPHOS, BILITOT in the last 168 hours. HematologyNo results for input(s): WBC, RBC, HGB, HCT, MCV, MCH, MCHC, RDW, PLT in the last 168 hours. Cardiac EnzymesNo results for input(s): TROPONINI in the last 168 hours. No results for input(s): TROPIPOC in the last 168 hours.  BNPNo results for input(s): BNP, PROBNP in the last 168 hours.  DDimer No results for input(s): DDIMER in the last 168 hours.  Radiology/Studies:  No results found.  Assessment and Plan:   1. Nonischemic cardiomyopathy: Currently on optimal medical therapy with losartan and Toprol-XL.   Continue these medications during his hospitalization.  We Elfego Giammarino repeat an echocardiogram this admission. 2. Permanent atrial fibrillation: Currently  takes Coumadin.  Is supratherapeutic today.  Would plan to hold Coumadin and potentially start heparin once he is getting towards being therapeutic.  He did convert to an AV paced rhythm and sinus rhythm after his external cardioversion in the emergency room at [facility].  It is unlikely that this Geramy Lamorte stay in normal rhythm.  Having wide-complex ectopy interspersed with his AV pacing and sinus rhythm.  It is likely that this is due to atrial fibrillation.  He would likely need his home Toprol-XL for rate control.  We Calen Geister start Toprol-XL for tonight, but at a lower dose.   3. Multiple ICD shocks: In review of device interrogation, it appears that his ICD shocks are likely due to rapid atrial fibrillation.  This is likely due to volume depletion from GI bleeding as well as not being able to take his dose of Toprol-XL.  I Jasyah Theurer give him a dose of metoprolol now, but would restart his Toprol-XL tonight. 4. Mechanical AVR: Status post Bentall and AVR in 2002.  INR goal 2-3.  Is supratherapeutic and holding Coumadin at this time. 5. Hematochezia: Plan for a GI consult.  Plan per GI and primary team.  Likely Virgilia Quigg need to start IV fluids as he is most likely dehydrated. 6. Supratherapeutic INR: Plan for 2 units of FFP.  Plan per primary team.  Goal INR between 2 and 3.      For questions or updates, please contact [place] Please consult [email or web address] for contact info under     Signed, Kizzie Cotten Meredith Leeds, MD  11/05/2017 3:10 PM

## 2017-11-05 NOTE — Progress Notes (Signed)
CRITICAL VALUE ALERT  Critical Value: INR= 8.35  Date & Time Notied:  09/22 at 59 Albertina Senegal RN)  Provider Notified: DR S. Nettey  Orders Received/Actions taken:

## 2017-11-05 NOTE — H&P (Signed)
History and Physical  Heinz Eckert WPY:[id number] DOB: May 03, 1940 DOA: 11/05/2017  Referring physician: Richardson Chiquito  PCP: [name], MD  Outpatient Specialists: Dr. [name] (Cardiology Patient coming from: Home  Chief Complaint: ICD shocks   HPI: Timothy SKELLEY Sr. is a 77 y.o. male with medical history significant for nonischemic cardiomyopathy status post biventricular ICD, atrial fibrillation on Coumadin, recent local hernia repair (8/19), mechanical aortic valve replacement (2002), HTN, HLD who presents on 11/05/2017 with several days of blood in stool.  Patient states this past Tuesday he started to feel less like himself with subjective fevers, chills, and dysuria.  He was evaluated by his PCP who started him on an antibiotic that he is unable to remember for presumed UTI.  Shortly after starting antibiotic he began having abdominal pain and noted blood in his bowel movements and even occasional blood with vomiting that has been ongoing for at least 3 to 4 days. No blood in urine  He thought it would go away on its own; however, this morning he felt his ICD fire 9 times and was directed to the ED.     ED Course:   Initially presented to [facility] rocking him ED for there was found to be in wide-complex tachycardic rhythm presumed to be ventricular tachycardia for which she was started on amiodarone drip however that was discontinued as his blood pressure dropped to 80s over 40s.  Patient also found to have hematochezia with an INR of 9.7.  Patient received IV Protonix.  Hemoglobin was found to be 11.7.  Patient remained hemodynamically stable for transfer to [facility] cardiology and GI consultation (endoscopic evaluation)  Review of Systems:As mentioned in the history of present illness.Review of systems are otherwise negative Patient seen after transfer to [facility] .   Past Medical History:  Diagnosis Date  . Aortic aneurysm, thoracic ([place])    Fusiform s/p Bentall procedure  2002  . Aortic valve, bicuspid    Mechanical AVR  . Arthritis   . Basal cell carcinoma of face   . BPH (benign prostatic hypertrophy)   . Coronary atherosclerosis of native coronary artery    Nonobstructive at cardiac catherization 2002  . Essential hypertension, benign   . Left bundle branch block   . Mixed hyperlipidemia   . Nonischemic cardiomyopathy (HCC)    CRT-D  . Persistent atrial fibrillation [facility])    Past Surgical History:  Procedure Laterality Date  . BENTALL PROCEDURE     Dr Cyndia Bent 2002  . BI-VENTRICULAR IMPLANTABLE CARDIOVERTER DEFIBRILLATOR N/A 07/12/2012   Procedure: BI-VENTRICULAR IMPLANTABLE CARDIOVERTER DEFIBRILLATOR  (CRT-D);  Surgeon: [name], MD;  Location: [facility] CATH LAB;  Service: Cardiovascular;  Laterality: N/A;  . BI-VENTRICULAR IMPLANTABLE CARDIOVERTER DEFIBRILLATOR  (CRT-D)  07/11/2012   [facility]- Dr. Rayann Heman  . CARDIAC VALVE REPLACEMENT    . CATARACT EXTRACTION Left 01/21/13  . COLONOSCOPY N/A 12/01/2016   Procedure: COLONOSCOPY;  Surgeon: Rogene Houston, MD;  Location[facility] ENDO [facility];  Service: Endoscopy;  Laterality: N/A;  2:00  . TONSILLECTOMY AND ADENOIDECTOMY  1949   No Known Allergies Social History:  reports that he quit smoking about 17 years ago. His smoking use included cigarettes. He has a 41.00 pack-year smoking history. He has never used smokeless tobacco. He reports that he drinks about 2.0 standard drinks of alcohol per week. He reports that he does not use drugs. Family History  Problem Relation Age of Onset  . Coronary artery disease  Father      Prior to Admission medications   Medication Sig Start Date End Date Taking? Authorizing Provider  atorvastatin (LIPITOR) 10 MG tablet Take 10 mg by mouth daily.    [provider]  cholecalciferol (VITAMIN D) 1000 units tablet Take 1,000 Units by mouth daily.    [provider]  furosemide (LASIX) 20 MG tablet Take 20 mg by mouth daily as needed for  fluid or edema.     [provider]  hydrocortisone cream 1 % Apply 1 application topically daily as needed for itching.    [provider]  losartan (COZAAR) 50 MG tablet Take 1 tablet (50 mg total) by mouth daily. Patient taking differently: Take 50 mg by mouth every evening.  04/23/13   Satira Sark, MD  meclizine (ANTIVERT) 25 MG tablet Take 25 mg by mouth 3 (three) times daily as needed for dizziness.    [provider]  metoprolol succinate (TOPROL-XL) 100 MG 24 hr tablet TAKE 1 AND 1/2 TABLETS BY MOUTH TWICE DAILY 08/28/17   Satira Sark, MD  metroNIDAZOLE (METROCREAM) 0.75 % cream Apply 1 application topically daily as needed (rosacea).    [provider]  Omega-3 Fatty Acids (FISH OIL) 1000 MG CAPS Take 1,000 mg by mouth daily.     [provider]  oxymetazoline (AFRIN) 0.05 % nasal spray Place 1 spray into both nostrils daily as needed for congestion.    [provider]  phenylephrine (NEO-SYNEPHRINE) 1 % nasal spray Place 1 drop into both nostrils daily as needed for congestion.    [provider]  Tamsulosin HCl (FLOMAX) 0.4 MG CAPS Take 0.4 mg by mouth daily after supper.     [provider]  vitamin C (ASCORBIC ACID) 500 MG tablet Take 500 mg by mouth daily.    [provider]  warfarin (COUMADIN) 5 MG tablet Take 2.5-5 mg by mouth every evening. Take 2.5 mg on Monday and Friday, on all other days take 5mg  daily    [provider]    Physical Exam: BP 128/72 (BP Location: Left Arm)   Pulse (!) 102   Temp 98.2 F (36.8 C) (Oral)   SpO2 99%   Constitutional normal appearing male, no distress Eyes: EOMI, anicteric, normal conjunctivae ENMT: Oropharynx with moist mucous membranes, normal dentition Cardiovascular: regular rate and rhythm, occasional ectopic beats, mechanical click, no peripheral edema Respiratory: Normal respiratory effort, clear breath sounds  Abdomen:  Soft,non-tender, well healed incision on anterior of abdomen Skin: No rash ulcers, or lesions. Without skin tenting  Neurologic: Grossly no focal neuro deficit. Psychiatric:Appropriate affect, and mood. Mental status AAOx3          Labs on Admission:  Basic Metabolic Panel: No results for input(s): NA, K, CL, CO2, GLUCOSE, BUN, CREATININE, CALCIUM, MG, PHOS in the last 168 hours. Liver Function Tests: No results for input(s): AST, ALT, ALKPHOS, BILITOT, PROT, ALBUMIN in the last 168 hours. No results for input(s): LIPASE, AMYLASE in the last 168 hours. No results for input(s): AMMONIA in the last 168 hours. CBC: No results for input(s): WBC, NEUTROABS, HGB, HCT, MCV, PLT in the last 168 hours. Cardiac Enzymes: No results for input(s): CKTOTAL, CKMB, CKMBINDEX, TROPONINI in the last 168 hours.  BNP (last 3 results) No results for input(s): BNP in the last 8760 hours.  ProBNP (last 3 results) No results for input(s): PROBNP in the last 8760 hours.  CBG: No results for input(s): GLUCAP in the last  168 hours.  Radiological Exams on Admission: No results found.  EKG: Independently reviewed.PVCs, V paced Assessment/Plan Present on Admission: . GI bleed  Active Problems:   GI bleed   Acute blood loss anemia secondary to Hematochezia. Still having episodes with witnessed bloody stool in commode during examination. hgb trend 12-9.5 He also reports some hematemesis as well prior to admission. Very likely related to supratherapeutic INR. GI consulted on 9/22, discussed with Dr. Oletta Lamas, will need to reverse INR before any endoscopic evaluation unless becomes hemodynamically unstable, IV protonix BID, monitor cbc q 6H, type and screen, ok for clear liquid diet. Of note recent umbilical hernia repair w/ mesh 09/2017  Supratherapeutic INR. Likely related to interaction with recent antibiotics for UTI. Given active bleed will give 2 U FFP and monitor INR. Once approaching subtherapeutic  levels can transition to IV heparin in light of mechanical heart valve.   Multiple ICD shocks. Treated with amiodarone prior to transfer for presumed Ventricular tachycardia; however, discontinued due to hypotension. Cardiology consulted, appreciate recommendations  Atrial Fibrillation. In discussion with cardiology and based off rhythm strips from ED his tachycardia is more likely atrial fibrillation given the irregularity.Cardiology recommendations--resume home Toprol-XL, goal INR 2-3  Hypophosphatemia. Low at 1.7, will replete  Mechanical heart valve ( 2002). INR goal 2-3. Holding coumadin given elevated INR. Can start heparin once INR approaches subtherapeutic levels  Nonischemic cardiomyopathy. Holding Home losartan in light of acute bleed and to maintain normotensive blood pressures, reduced dose Toprol-XL. Repeat TTE per cardiology  Umbilical hernia repair. S/p mesh repair 09/2017.     Code Status: FULL CODE   Family Communication: none at bedside   Disposition Plan: Admit to inpatient/stepdown unit, reverse INR, monitor CBC and HR    Consultants:  GI, Cardiology  Procedures: none     Desiree Hane MD [facility]  Pager [phone]  If 7PM-7AM, please contact night-coverage [email or web address] Password [facility]  11/05/2017, 3:13 PM

## 2017-11-05 NOTE — Progress Notes (Signed)
Patient had 15 beat run VT per CMT.  Dr Lonny Prude notified. No new orders recieved

## 2017-11-05 NOTE — Progress Notes (Addendum)
Pt had a run of vtach upon returning from the bathroom. Pt appears to be asymptomatic. Please see saved strip for 9/22 at 2339

## 2017-11-06 ENCOUNTER — Inpatient Hospital Stay (HOSPITAL_COMMUNITY): Payer: Medicare HMO

## 2017-11-06 ENCOUNTER — Encounter (HOSPITAL_COMMUNITY): Payer: Self-pay | Admitting: General Practice

## 2017-11-06 ENCOUNTER — Other Ambulatory Visit: Payer: Self-pay

## 2017-11-06 DIAGNOSIS — I340 Nonrheumatic mitral (valve) insufficiency: Secondary | ICD-10-CM

## 2017-11-06 DIAGNOSIS — I481 Persistent atrial fibrillation: Secondary | ICD-10-CM

## 2017-11-06 DIAGNOSIS — Z95810 Presence of automatic (implantable) cardiac defibrillator: Secondary | ICD-10-CM

## 2017-11-06 DIAGNOSIS — K922 Gastrointestinal hemorrhage, unspecified: Secondary | ICD-10-CM

## 2017-11-06 DIAGNOSIS — I480 Paroxysmal atrial fibrillation: Secondary | ICD-10-CM

## 2017-11-06 LAB — CBC
HCT: 26 % — ABNORMAL LOW (ref 39.0–52.0)
HCT: 29.3 % — ABNORMAL LOW (ref 39.0–52.0)
HCT: 26.7 % — ABNORMAL LOW (ref 39.0–52.0)
Hemoglobin: 8.4 g/dL — ABNORMAL LOW (ref 13.0–17.0)
Hemoglobin: 9.5 g/dL — ABNORMAL LOW (ref 13.0–17.0)
Hemoglobin: 8.5 g/dL — ABNORMAL LOW (ref 13.0–17.0)
MCH: 29.6 pg (ref 26.0–34.0)
MCH: 29.9 pg (ref 26.0–34.0)
MCH: 29.5 pg (ref 26.0–34.0)
MCHC: 32.3 g/dL (ref 30.0–36.0)
MCHC: 32.4 g/dL (ref 30.0–36.0)
MCHC: 31.8 g/dL (ref 30.0–36.0)
MCV: 91.5 fL (ref 78.0–100.0)
MCV: 92.1 fL (ref 78.0–100.0)
MCV: 92.7 fL (ref 78.0–100.0)
PLATELETS: 116 10*3/uL — AB (ref 150–400)
PLATELETS: 141 10*3/uL — AB (ref 150–400)
Platelets: 146 10*3/uL — ABNORMAL LOW (ref 150–400)
RBC: 2.84 MIL/uL — ABNORMAL LOW (ref 4.22–5.81)
RBC: 3.18 MIL/uL — ABNORMAL LOW (ref 4.22–5.81)
RBC: 2.88 MIL/uL — AB (ref 4.22–5.81)
RDW: 13.6 % (ref 11.5–15.5)
RDW: 13.8 % (ref 11.5–15.5)
RDW: 13.7 % (ref 11.5–15.5)
WBC: 6 10*3/uL (ref 4.0–10.5)
WBC: 6.5 10*3/uL (ref 4.0–10.5)
WBC: 6.2 10*3/uL (ref 4.0–10.5)

## 2017-11-06 LAB — PREPARE FRESH FROZEN PLASMA
UNIT DIVISION: 0
UNIT DIVISION: 0

## 2017-11-06 LAB — BASIC METABOLIC PANEL
Anion gap: 7 (ref 5–15)
BUN: 37 mg/dL — AB (ref 8–23)
CO2: 25 mmol/L (ref 22–32)
CREATININE: 1.17 mg/dL (ref 0.61–1.24)
Calcium: 7.9 mg/dL — ABNORMAL LOW (ref 8.9–10.3)
Chloride: 111 mmol/L (ref 98–111)
GFR calc Af Amer: >60 mL/min (ref >60)
GFR, EST NON AFRICAN AMERICAN: 58 mL/min — AB (ref >60)
Glucose, Bld: 109 mg/dL — ABNORMAL HIGH (ref 70–99)
Potassium: 3.7 mmol/L (ref 3.5–5.1)
SODIUM: 143 mmol/L (ref 135–145)

## 2017-11-06 LAB — BPAM FFP
BLOOD PRODUCT EXPIRATION DATE: 201909222359
Blood Product Expiration Date: 201909222359
ISSUE DATE / TIME: 201909221532
ISSUE DATE / TIME: 201909221532
UNIT TYPE AND RH: 6200
Unit Type and Rh: 6200

## 2017-11-06 LAB — BLOOD PRODUCT ORDER (VERBAL) VERIFICATION

## 2017-11-06 LAB — TYPE AND SCREEN
ABO/RH(D): A POS
ANTIBODY SCREEN: NEGATIVE

## 2017-11-06 LAB — PROTIME-INR
INR: 3.7
PROTHROMBIN TIME: 36.4 s — AB (ref 11.4–15.2)

## 2017-11-06 MED ORDER — ACETAMINOPHEN 325 MG PO TABS: 650.0000 mg | ORAL_TABLET | Freq: Four times a day (QID) | ORAL | Status: DC | PRN

## 2017-11-06 MED ORDER — ATORVASTATIN CALCIUM 10 MG PO TABS
10.0000 mg | ORAL_TABLET | Freq: Every day | ORAL | Status: DC
  Administered 2017-11-06 – 2017-11-17 (×12): 10 mg via ORAL
  Filled 2017-11-06 (×12): qty 1

## 2017-11-06 MED ORDER — TAMSULOSIN HCL 0.4 MG PO CAPS
0.4000 mg | ORAL_CAPSULE | Freq: Every day | ORAL | Status: DC
  Administered 2017-11-06 – 2017-11-17 (×12): 0.4 mg via ORAL
  Filled 2017-11-06 (×12): qty 1

## 2017-11-06 MED ORDER — VITAMIN D 1000 UNITS PO TABS
1000.0000 [IU] | ORAL_TABLET | Freq: Every day | ORAL | Status: DC
  Administered 2017-11-06 – 2017-11-18 (×12): 1000 [IU] via ORAL
  Filled 2017-11-06 (×12): qty 1

## 2017-11-06 MED ORDER — OMEGA-3-ACID ETHYL ESTERS 1 G PO CAPS
1000.0000 mg | ORAL_CAPSULE | Freq: Every day | ORAL | Status: DC
  Administered 2017-11-06 – 2017-11-18 (×11): 1000 mg via ORAL
  Filled 2017-11-06 (×11): qty 1

## 2017-11-06 MED ORDER — MECLIZINE HCL 25 MG PO TABS
25.0000 mg | ORAL_TABLET | Freq: Three times a day (TID) | ORAL | Status: DC | PRN
  Filled 2017-11-06: qty 1

## 2017-11-06 MED ORDER — VITAMIN C 500 MG PO TABS
500.0000 mg | ORAL_TABLET | Freq: Every day | ORAL | Status: DC
  Administered 2017-11-06 – 2017-11-18 (×12): 500 mg via ORAL
  Filled 2017-11-06 (×12): qty 1

## 2017-11-06 NOTE — Progress Notes (Signed)
Progress Note   Subjective   Doing well today, the patient denies CP or SOB.    Inpatient Medications    Scheduled Meds: . metoprolol succinate  50 mg Oral BID  . pantoprazole (PROTONIX) IV  40 mg Intravenous Q12H   Continuous Infusions:  PRN Meds: senna-docusate, traMADol   Vital Signs    Vitals:   11/05/17 1952 11/06/17 0005 11/06/17 0400 11/06/17 0753  BP: 121/84 108/74 105/77 108/78  Pulse: 88 90    Resp: 14 19    Temp: 98.2 F (36.8 C) 97.8 F (36.6 C) 98.4 F (36.9 C) 98.1 F (36.7 C)  TempSrc: Oral Oral Oral Oral  SpO2: 99% 98% 100% 100%  Weight:   88.4 kg   Height:   5\' 8"  (1.727 m)     Intake/Output Summary (Last 24 hours) at 11/06/2017 0853 Last data filed at 11/06/2017 0000 Gross per 24 hour  Intake 1233 ml  Output -  Net 1233 ml   Filed Weights   11/06/17 0400  Weight: 88.4 kg    Telemetry    Sinus with BiV pacing - Personally Reviewed  Physical Exam   GEN- The patient is well appearing, alert and oriented x 3 today.   Head- normocephalic, atraumatic Eyes-  Sclera clear, conjunctiva pink Ears- hearing intact Oropharynx- clear Neck- supple, Lungs-  normal work of breathing Heart- Regular rate and rhythm  Extremities- no clubbing, cyanosis, or edema  MS- no significant deformity or atrophy Skin- no rash or lesion Psych- anxious Neuro- strength and sensation are intact   Labs    Chemistry Recent Labs  Lab 11/05/17 1518 11/06/17 0318  NA 140 143  K 4.1 3.7  CL 110 111  CO2 21* 25  GLUCOSE 111* 109*  BUN 47* 37*  CREATININE 1.20 1.17  CALCIUM 7.7* 7.9*  PROT 5.1*  --   ALBUMIN 2.6*  --   AST 94*  --   ALT 51*  --   ALKPHOS 61  --   BILITOT 0.9  --   GFRNONAA 57* 58*  GFRAA >60 >60  ANIONGAP 9 7     Hematology Recent Labs  Lab 11/05/17 1518 11/05/17 2052 11/06/17 0318  WBC 7.4 8.3 6.0  RBC 3.24* 3.29* 2.84*  HGB 9.5* 9.9* 8.4*  HCT 29.6* 29.9* 26.0*  MCV 91.4 90.9 91.5  MCH 29.3 30.1 29.6  MCHC 32.1  33.1 32.3  RDW 13.5 13.5 13.6  PLT 101* 126* 116*    Cardiac EnzymesNo results for input(s): TROPONINI in the last 168 hours. No results for input(s): TROPIPOC in the last 168 hours.      Assessment & Plan    1.  afib with RVR Occurred in setting of profound anemia Currently in sinus rhythm s/p cardioversion Coumadin on hold for GI bleeding We have previously discussed AV nodal ablation and he has declined.  I would not advise that we consider this during his current admission Continue to titrate metoprolol as HR dictates.  2. multiple ICD shocks for AF with RVR ICD interrogation reviewed at length today I have adjusted VT zone from 180 bpm to a single VF zone of 222 bpm to minimize shocks.  He has never had appropriate ICD therapy prior.  This should reduce the opportunity for shocks for afib in the future.  3. GI bleed GI is currently at bedside and plans for EGD/colonoscopy on Wednesday No further EP workup required in the interim. Start heparin drip once INR < 2  4.  Chronic systolic dysfunction/ mechanical AVR General cardiology to manage  Electrophysiology team to see as needed while here.  General cardiology to follow Please call with questions.   [name] MD, [facility] 11/06/2017 8:53 AM

## 2017-11-06 NOTE — Progress Notes (Signed)
ANTICOAGULATION CONSULT NOTE - Initial Consult  Pharmacy Consult for Heparin Indication: Mechanical AVR + Afib  No Known Allergies  Patient Measurements: Height: 5\' 8"  (172.7 cm) Weight: 194 lb 14.4 oz (88.4 kg) IBW/kg (Calculated) : 68.4 Heparin Dosing Weight: 86.4 kg  Vital Signs: Temp: 98.1 F (36.7 C) (09/Timothy 0753) Temp Source: Oral (09/Timothy 0753) BP: 123/63 (09/Timothy 1103) Pulse Rate: 88 (09/Timothy 1103)  Labs: Recent Labs    11/05/17 1518 11/05/17 2052 09/Timothy/19 0318 09/Timothy/19 0926  HGB 9.5* 9.9* 8.4* 9.5*  HCT 29.6* 29.9* 26.0* 29.3*  PLT 101* 126* 116* 146*  LABPROT 68.8*  --  36.4*  --   INR 8.35*  --  3.70  --   CREATININE 1.20  --  1.17  --     Estimated Creatinine Clearance: 57.1 mL/min (by C-G formula based on SCr of 1.17 mg/dL).   Medical History: Past Medical History:  Diagnosis Date  . Aortic aneurysm, thoracic ([place])    Fusiform s/p Bentall procedure 2002  . Aortic valve, bicuspid    Mechanical AVR  . Arthritis   . Basal cell carcinoma of face   . BPH (benign prostatic hypertrophy)   . Coronary atherosclerosis of native coronary artery    Nonobstructive at cardiac catherization 2002  . Essential hypertension, benign   . Left bundle branch block   . Mixed hyperlipidemia   . Nonischemic cardiomyopathy (HCC)    CRT-D  . Persistent atrial fibrillation ([place])     Assessment: Timothy Fritz who presented on 9/22 with ICD shocks and concern for lower GIB. The patient was on warfarin PTA for hx mech AVR + Afib. Admit INR elevated at 8.35 in the setting of recent outpatient Bactrim on PTA dosing of 5 mg daily. Pharmacy consulted to start Heparin when INR<2.5  Goal of Therapy:  Heparin level 0.3-0.7 units/ml INR 2.5-3.5 Monitor platelets by anticoagulation protocol: Yes   Plan:  - No heparin for now - F/u INR on 9/24 AM   Thank you for allowing pharmacy to be a part of this patient's care.  Alycia Rossetti, PharmD, BCPS Clinical Pharmacist Pager:  [phone] Please check AMION for all [place] numbers 9/Timothy/2019 4:59 PM

## 2017-11-06 NOTE — Consult Note (Signed)
[facility] Consult  Referring Provider: Desiree Hane, MD Primary Care Physician:  [name], MD Primary Gastroenterologist:Unassigned( Rehman, Mechele Dawley, MD-Annie Ileene Rubens, MD)  Reason for Consultation:  Rectal bleeding and vomiting blood  HPI: Timothy ARMATO Sr. is a 77 y.o. male was transferred from [facility] after his AICD fired. Patient states that he was in his usual state of health until Tuesday of last week when he developed a urinary tract infection and required antibiotics. Since last Thursday, he has developed abdominal pain described as more generalized but also in the lower abdomen associated with multiple episodes of bleeding per rectum which she describes as dark maroon stools. He did not have any fevers but tried taking baking soda without relief. He also had an episode of vomiting and saw about a tablespoon of bright red blood. Last colonoscopy was performed by Dr. Laural Golden in 10/18 for hematochezia and was found to have diverticulosis along with internal and external hemorrhoids. Patient states that while at [facility] and also since admission he has continued to have dark stools. Patient has a mechanical aortic valve and takes Coumadin, also has atrial fibrillation with RVR,but reports that he stopped Coumadin about 5 days prior to presentation. Normally patient has regular bowel movements and it is unusual for him to see blood in stool or black stools. He denies acid reflux, regurgitation, heartburn, difficulty swallowing or pain on swallowing. He denies recent unintentional weight loss or loss of appetite.   Past Medical History:  Diagnosis Date  . Aortic aneurysm, thoracic ([place])    Fusiform s/p Bentall procedure 2002  . Aortic valve, bicuspid    Mechanical AVR  . Arthritis   . Basal cell carcinoma of face   . BPH (benign prostatic hypertrophy)   . Coronary atherosclerosis of native coronary artery    Nonobstructive at cardiac catherization  2002  . Essential hypertension, benign   . Left bundle branch block   . Mixed hyperlipidemia   . Nonischemic cardiomyopathy (HCC)    CRT-D  . Persistent atrial fibrillation [facility])     Past Surgical History:  Procedure Laterality Date  . BENTALL PROCEDURE     Dr Cyndia Bent 2002  . BI-VENTRICULAR IMPLANTABLE CARDIOVERTER DEFIBRILLATOR N/A 07/12/2012   Procedure: BI-VENTRICULAR IMPLANTABLE CARDIOVERTER DEFIBRILLATOR  (CRT-D);  Surgeon: [name], MD;  Location: [facility] CATH LAB;  Service: Cardiovascular;  Laterality: N/A;  . BI-VENTRICULAR IMPLANTABLE CARDIOVERTER DEFIBRILLATOR  (CRT-D)  07/11/2012   [facility]- Dr. Rayann Heman  . CARDIAC VALVE REPLACEMENT    . CATARACT EXTRACTION Left 01/21/13  . COLONOSCOPY N/A 12/01/2016   Procedure: COLONOSCOPY;  Surgeon: Rogene Houston, MD;  Location: [facility]O SUITE;  Service: Endoscopy;  Laterality: N/A;  2:00  . [place]    Prior to Admission medications   Medication Sig Start Date End Date Taking? Authorizing Provider  atorvastatin (LIPITOR) 10 MG tablet Take 10 mg by mouth daily.    [provider]  cholecalciferol (VITAMIN D) 1000 units tablet Take 1,000 Units by mouth daily.    [provider]  furosemide (LASIX) 20 MG tablet Take 20 mg by mouth daily as needed for fluid or edema.     [provider]  hydrocortisone cream 1 % Apply 1 application topically daily as needed for itching.    [provider]  losartan (COZAAR) 50 MG tablet Take 1 tablet (50 mg total) by mouth daily. Patient taking differently: Take 50 mg by mouth every evening.  04/23/13   Satira Sark, MD  meclizine (ANTIVERT) 25 MG tablet Take 25 mg by mouth 3 (three) times daily as needed for dizziness.    [provider]  metoprolol succinate (TOPROL-XL) 100 MG 24 hr tablet TAKE 1 AND 1/2 TABLETS BY MOUTH TWICE DAILY 08/28/17   Satira Sark, MD  metroNIDAZOLE (METROCREAM) 0.75 % cream  Apply 1 application topically daily as needed (rosacea).    [provider]  Omega-3 Fatty Acids (FISH OIL) 1000 MG CAPS Take 1,000 mg by mouth daily.     [provider]  oxymetazoline (AFRIN) 0.05 % nasal spray Place 1 spray into both nostrils daily as needed for congestion.    [provider]  phenylephrine (NEO-SYNEPHRINE) 1 % nasal spray Place 1 drop into both nostrils daily as needed for congestion.    [provider]  Tamsulosin HCl (FLOMAX) 0.4 MG CAPS Take 0.4 mg by mouth daily after supper.     [provider]  vitamin C (ASCORBIC ACID) 500 MG tablet Take 500 mg by mouth daily.    [provider]  warfarin (COUMADIN) 5 MG tablet Take 2.5-5 mg by mouth every evening. Take 2.5 mg on Monday and Friday, on all other days take 5mg  daily    [provider]    Current Facility-Administered Medications  Medication Dose Route Frequency Provider Last Rate Last Dose  . metoprolol succinate (TOPROL-XL) 24 hr tablet 50 mg  50 mg Oral BID Constance Haw, MD   50 mg at 11/05/17 2240  . pantoprazole (PROTONIX) injection 40 mg  40 mg Intravenous Q12H Oretha Milch D, MD   40 mg at 11/05/17 2240  . senna-docusate (Senokot-S) tablet 1 tablet  1 tablet Oral QHS PRN Oretha Milch D, MD      . traMADol (ULTRAM) tablet 50 mg  50 mg Oral Q6H PRN Desiree Hane, MD        Allergies as of 11/05/2017  . (No Known Allergies)    Family History  Problem Relation Age of Onset  . Coronary artery disease Father     Social History   Socioeconomic History  . Marital status: Widowed    Spouse name: Not on file  . Number of children: Not on file  . Years of education: Not on file  . Highest education level: Not on file  Occupational History  . Occupation: Charity fundraiser  Social Needs  . Financial resource strain: Not on file  . Food insecurity:    Worry: Not on file    Inability: Not on file  . Transportation needs:     Medical: Not on file    Non-medical: Not on file  Tobacco Use  . Smoking status: Former Smoker    Packs/day: 1.00    Years: 41.00    Pack years: 41.00    Types: Cigarettes    Last attempt to quit: 02/15/2000    Years since quitting: 17.7  . Smokeless tobacco: Never Used  Substance and Sexual Activity  . Alcohol use: Yes    Alcohol/week: 2.0 standard drinks    Types: 2 Glasses of wine per week    Comment: 07/12/2012 "2-3 glasses twice/month"  . Drug use: No  . Sexual activity: Not on file  Lifestyle  . Physical activity:    Days per week: Not on file    Minutes per session: Not on file  . Stress: Not on file  Relationships  . Social connections:    Talks  on phone: Not on file    Gets together: Not on file    Attends religious service: Not on file    Active member of club or organization: Not on file    Attends meetings of clubs or organizations: Not on file    Relationship status: Not on file  . Intimate partner violence:    Fear of current or ex partner: Not on file    Emotionally abused: Not on file    Physically abused: Not on file    Forced sexual activity: Not on file  Other Topics Concern  . Not on file  Social History Narrative   Widow, Lives in [place]   Regular Exercise --yes   Retired Charity fundraiser   Attends [employer]: Positive for: GI: Described in detail in HPI.    Gen: anorexia, fatigue, weakness, malaise,Denies any fever, chills, rigors, night sweats,  involuntary weight loss, and sleep disorder CV: palpitations,Denies chest pain, angina,  syncope, orthopnea, PND, peripheral edema, and claudication. Resp: Denies dyspnea, cough, sputum, wheezing, coughing up blood. GU :  urinary burning,urinary frequency, Deniesblood in urine, urinary hesitancy, nocturnal urination, and urinary incontinence. MS: Denies joint pain or swelling.  Denies muscle weakness, cramps, atrophy.  Derm: Denies rash, itching, oral ulcerations,  hives, unhealing ulcers.  Psych: Denies depression, anxiety, memory loss, suicidal ideation, hallucinations,  and confusion. Heme: rectal bleeding and vomiting blood, Denies bruising and enlarged lymph nodes. Neuro:  Denies any headaches, dizziness, paresthesias. Endo:  Denies any problems with DM, thyroid, adrenal function.  Physical Exam: Vital signs in last 24 hours: Temp:  [97.5 F (36.4 C)-99.2 F (37.3 C)] 98.1 F (36.7 C) (09/23 0753) Pulse Rate:  [84-102] 90 (09/23 0005) Resp:  [14-19] 19 (09/23 0005) BP: (105-128)/(68-84) 108/78 (09/23 0753) SpO2:  [98 %-100 %] 100 % (09/23 0753) Weight:  [88.4 kg] 88.4 kg (09/23 0400) Last BM Date: 11/06/17(pt said "it looked like pudding")  General:   Alert,  Well-developed, well-nourished, pleasant and cooperative in NAD Head:  Normocephalic and atraumatic. Eyes:  Sclera clear, no icterus.   Mild pallor Ears:  Normal auditory acuity. Nose:  No deformity, discharge,  or lesions. Mouth:  No deformity or lesions.  Oropharynx pink & moist. Neck:  Supple; no masses or thyromegaly. Lungs:  Clear throughout to auscultation.   No wheezes, crackles, or rhonchi. No acute distress. Heart:   Click, Regular rate and rhythm; no murmurs, rubs,  or gallops. Extremities:  Without clubbing or edema. Neurologic:  Alert and  oriented x4;  grossly normal neurologically. Skin:  Intact without significant lesions or rashes. Psych:  Alert and cooperative. Normal mood and affect. Abdomen:  Soft, nontender and mildly distended. No masses, hepatosplenomegaly or hernias noted. Normal bowel sounds, without guarding, and without rebound.         Lab Results: Recent Labs    11/05/17 1518 11/05/17 2052 11/06/17 0318  WBC 7.4 8.3 6.0  HGB 9.5* 9.9* 8.4*  HCT 29.6* 29.9* 26.0*  PLT 101* 126* 116*   BMET Recent Labs    11/05/17 1518 11/06/17 0318  NA 140 143  K 4.1 3.7  CL 110 111  CO2 21* 25  GLUCOSE 111* 109*  BUN 47* 37*  CREATININE 1.20 1.17   CALCIUM 7.7* 7.9*   LFT Recent Labs    11/05/17 1518  PROT 5.1*  ALBUMIN 2.6*  AST 94*  ALT 51*  ALKPHOS 61  BILITOT 0.9   PT/INR Recent  Labs    11/05/17 1518 11/06/17 0318  LABPROT 68.8* 36.4*  INR 8.35* 3.70    Studies/Results: No results found.  Impression: 1. Anemia in the setting of multiple episodes of rectal bleeding and one episode of vomiting blood. 2.Supratherapeutic   INR of 8.35 presentation and has decreased to 3.7 today 3. A. Fib with RVR, sinus rhythm status post cardioversion, multiple AICD shocks for a A. Fib with RVR, chronic systolic dysfunction 4. Mechanical aortic valve 5. Abnormal LFTs   Plan: Plan a diagnostic colonoscopy and endoscopy likely on Wednesday,hopefully INR will be around 1.8 by then. Discussed with Dr. Rayann Heman from cardiology- okay to hold off on PRBC transfusion for now, patient will probably need his hemoglobin to be around 9-10, recommend monitoring H&H every 12 hours and transfusing as needed. Plan to start heparin once INR is less than 2 as patient has mechanical aortic valve. Etiologies to consider for upper GI bleed includes Mallory-Weiss tear/peptic ulcer disease. Elevated BUN/creatinine ratio of 47/1.2 on admission. While lower GI bleeding could be related to diverticulosis, possible ischemic colitis as patient also had abdominal pain on presentation. Continue PPI twice a day.  T bili 0.9 but AST 94/AST 51 with a normal ALP of 61 Possibilities of congestive hepatopathy or fatty liver. There is no recent imaging, recommend a CAT scan or an ultrasound of the liver to be done as an outpatient,unless LFTs worsen,then may require further workup as an inpatient.   LOS: 1 day   Ronnette Juniper, M.D.  11/06/2017, 8:57 AM  Pager [phone] If no answer or after 5 PM call [phone]

## 2017-11-06 NOTE — Progress Notes (Signed)
[facility] TEAM 1 - Stepdown/ICU TEAM  Timothy BAKKEN Sr.  AYT:[id number] DOB: 29-Jul-1940 DOA: 11/05/2017 PCP: [name], MD    Brief Narrative:  77 y.o. male with a hx of nonischemic cardiomyopathy status post biventricular ICD, atrial fibrillation on Coumadin, recent local hernia repair (8/19), mechanical aortic valve replacement (2002), HTN, and HLD who presented on 11/05/2017 with several days of blood in stool.  Patient states this past Tuesday he started to feel less like himself with subjective fevers, chills, and dysuria.  He was evaluated by his PCP who started him on an antibiotic that he is unable to remember for presumed UTI.  Shortly after starting antibiotic he began having abdominal pain and noted blood in his bowel movements and even occasional blood with vomiting that was ongoing for at least 3 to 4 days. He thought it would go away on its own; however after he felt his ICD fire 9 times he presented to the ED.    At [facility] ED he was found to be in a wide-complex tachycardia presumed to be ventricular tachycardia for which she was started on an amiodarone drip. This was discontinued as his blood pressure dropped to 80s.  Patient also found to have hematochezia with an INR of 9.7.  Hemoglobin was found to be 11.7.  Patient remained hemodynamically stable for transfer to [facility] cardiology and GI consultation.  Significant Events: 9/22 Transferred to [facility] from [place]   Subjective: Resting comfortably in bed.  Reports slowing but not ceasing of marroon blood in stools. Denies cp, n/v, sob, or HA.   Assessment & Plan:  Acute blood loss anemia - Hematochezia / GIB Ongoing bleeding, but slowing clinically - cont to follow Hgb in serial fashion - for endoscopic eval once INR will allow   Supratherapeutic INR Likely related to interaction with recent antibiotic for UTI - once approaching subtherapeutic levels will transition to IV heparin in light of mechanical heart  valve    Multiple ICD shocks Cardiology feels these were triggered by Afib NOT a ventricular arrythmia - his AICD has been adjusted   Atrial Fibrillation w/ RVR Cards/EP following - cont BB - warfarin on hold due to bleeding   Hypophosphatemia F/u in AM   Mechanical Aortic valve ( 2002) INR goal 2-3 - holding coumadin given bleeding - start heparin once INR subtherapeutic  Nonischemic cardiomyopathy  Umbilical hernia repair S/p mesh repair 09/2017.    DVT prophylaxis: warfarin / IV heparin  Code Status: FULL CODE Family Communication: no family present at time of exam  Disposition Plan: SDU  Consultants:  GI Cardiology - EP  Antimicrobials:  none  Objective: Blood pressure 123/63, pulse 88, temperature 98.1 F (36.7 C), temperature source Oral, resp. rate 19, height 5\' 8"  (1.727 m), weight 88.4 kg, SpO2 100 %.  Intake/Output Summary (Last 24 hours) at 11/06/2017 1600 Last data filed at 11/06/2017 1300 Gross per 24 hour  Intake 1953 ml  Output -  Net 1953 ml   Filed Weights   11/06/17 0400  Weight: 88.4 kg    Examination: General: No acute respiratory distress Lungs: Clear to auscultation bilaterally without wheezes or crackles Cardiovascular: Regular rate and rhythm with metallic click  Abdomen: Nontender, nondistended, soft, bowel sounds positive, no rebound, no ascites, no appreciable mass Extremities: No significant cyanosis, clubbing, or edema bilateral lower extremities  CBC: Recent Labs  Lab 11/05/17 1518 11/05/17 2052 11/06/17 0318 11/06/17 0926  WBC 7.4 8.3 6.0 6.5  NEUTROABS 5.2  --   --   --  HGB 9.5* 9.9* 8.4* 9.5*  HCT 29.6* 29.9* 26.0* 29.3*  MCV 91.4 90.9 91.5 92.1  PLT 101* 126* 116* 383*   Basic Metabolic Panel: Recent Labs  Lab 11/05/17 1518 11/06/17 0318  NA 140 143  K 4.1 3.7  CL 110 111  CO2 21* 25  GLUCOSE 111* 109*  BUN 47* 37*  CREATININE 1.20 1.17  CALCIUM 7.7* 7.9*  MG 2.0  --   PHOS 1.7*  --     GFR: Estimated Creatinine Clearance: 57.1 mL/min (by C-G formula based on SCr of 1.17 mg/dL).  Liver Function Tests: Recent Labs  Lab 11/05/17 1518  AST 94*  ALT 51*  ALKPHOS 61  BILITOT 0.9  PROT 5.1*  ALBUMIN 2.6*    Coagulation Profile: Recent Labs  Lab 11/05/17 1518 11/06/17 0318  INR 8.35* 3.70    Recent Results (from the past 240 hour(s))  MRSA PCR Screening     Status: None   Collection Time: 11/05/17  2:08 PM  Result Value Ref Range Status   MRSA by PCR NEGATIVE NEGATIVE Final    Comment:        The GeneXpert MRSA Assay (FDA approved for NASAL specimens only), is one component of a comprehensive MRSA colonization surveillance program. It is not intended to diagnose MRSA infection nor to guide or monitor treatment for MRSA infections. Performed at [facility], [place] [address] [place], [place] [address]      Scheduled Meds: . metoprolol succinate  50 mg Oral BID  . pantoprazole (PROTONIX) IV  40 mg Intravenous Q12H     LOS: 1 day   Cherene Altes, MD [facility] Office  [phone] Pager - Text Page per Shea Evans  If 7PM-7AM, please contact night-coverage per Amion 11/06/2017, 4:00 PM

## 2017-11-06 NOTE — Progress Notes (Signed)
  Echocardiogram 2D Echocardiogram has been performed.  Timothy Fritz 11/06/2017, 2:32 PM

## 2017-11-07 DIAGNOSIS — I5042 Chronic combined systolic (congestive) and diastolic (congestive) heart failure: Secondary | ICD-10-CM

## 2017-11-07 DIAGNOSIS — R791 Abnormal coagulation profile: Secondary | ICD-10-CM

## 2017-11-07 DIAGNOSIS — Z954 Presence of other heart-valve replacement: Secondary | ICD-10-CM

## 2017-11-07 DIAGNOSIS — D62 Acute posthemorrhagic anemia: Secondary | ICD-10-CM

## 2017-11-07 LAB — COMPREHENSIVE METABOLIC PANEL
ALBUMIN: 2.9 g/dL — AB (ref 3.5–5.0)
ALT: 79 U/L — AB (ref 0–44)
AST: 137 U/L — AB (ref 15–41)
Alkaline Phosphatase: 67 U/L (ref 38–126)
Anion gap: 7 (ref 5–15)
BILIRUBIN TOTAL: 1 mg/dL (ref 0.3–1.2)
BUN: 22 mg/dL (ref 8–23)
CO2: 25 mmol/L (ref 22–32)
CREATININE: 1.08 mg/dL (ref 0.61–1.24)
Calcium: 8.2 mg/dL — ABNORMAL LOW (ref 8.9–10.3)
Chloride: 108 mmol/L (ref 98–111)
GFR calc Af Amer: >60 mL/min (ref >60)
GFR calc non Af Amer: >60 mL/min (ref >60)
GLUCOSE: 116 mg/dL — AB (ref 70–99)
POTASSIUM: 3.7 mmol/L (ref 3.5–5.1)
Sodium: 140 mmol/L (ref 135–145)
TOTAL PROTEIN: 5.6 g/dL — AB (ref 6.5–8.1)

## 2017-11-07 LAB — PROTIME-INR
INR: 3.61
Prothrombin Time: 35.7 seconds — ABNORMAL HIGH (ref 11.4–15.2)

## 2017-11-07 LAB — CBC
HEMATOCRIT: 26.8 % — AB (ref 39.0–52.0)
HEMOGLOBIN: 8.6 g/dL — AB (ref 13.0–17.0)
MCH: 30 pg (ref 26.0–34.0)
MCHC: 32.1 g/dL (ref 30.0–36.0)
MCV: 93.4 fL (ref 78.0–100.0)
PLATELETS: 158 10*3/uL (ref 150–400)
RBC: 2.87 MIL/uL — AB (ref 4.22–5.81)
RDW: 13.6 % (ref 11.5–15.5)
WBC: 5.9 10*3/uL (ref 4.0–10.5)

## 2017-11-07 LAB — PHOSPHORUS: Phosphorus: 3.1 mg/dL (ref 2.5–4.6)

## 2017-11-07 MED ORDER — PEG 3350-KCL-NA BICARB-NACL 420 G PO SOLR
4000.0000 mL | Freq: Once | ORAL | Status: AC
  Administered 2017-11-07: 4000 mL via ORAL
  Filled 2017-11-07: qty 4000

## 2017-11-07 MED ORDER — VITAMIN K1 10 MG/ML IJ SOLN
5.0000 mg | Freq: Once | INTRAVENOUS | Status: AC
  Administered 2017-11-07: 5 mg via INTRAVENOUS
  Filled 2017-11-07: qty 0.5

## 2017-11-07 MED ORDER — METOPROLOL SUCCINATE ER 50 MG PO TB24
75.0000 mg | ORAL_TABLET | Freq: Two times a day (BID) | ORAL | Status: DC
  Administered 2017-11-07 – 2017-11-08 (×3): 75 mg via ORAL
  Filled 2017-11-07 (×3): qty 1

## 2017-11-07 MED ORDER — SODIUM CHLORIDE 0.9 % IV SOLN
INTRAVENOUS | Status: DC | PRN
  Administered 2017-11-07 – 2017-11-10 (×2): via INTRAVENOUS

## 2017-11-07 NOTE — Progress Notes (Signed)
[place] TEAM 1 - Stepdown/ICU TEAM  Timothy FERRARA Sr.  WEX:[id number] DOB: 13-Mar-1940 DOA: 11/05/2017 PCP: [name], MD    Brief Narrative:  77 y.o. male with a hx of nonischemic cardiomyopathy status post biventricular ICD, atrial fibrillation on Coumadin, recent local hernia repair (8/19), mechanical aortic valve replacement (2002), HTN, and HLD who presented on 11/05/2017 with several days of blood in stool.  Patient states this past Tuesday he started to feel less like himself with subjective fevers, chills, and dysuria.  He was evaluated by his PCP who started him on an antibiotic that he is unable to remember for presumed UTI.  Shortly after starting antibiotic he began having abdominal pain and noted blood in his bowel movements and even occasional blood with vomiting that was ongoing for at least 3 to 4 days. He thought it would go away on its own; however after he felt his ICD fire 9 times he presented to the ED.    At [facility] ED he was found to be in a wide-complex tachycardia presumed to be ventricular tachycardia for which she was started on an amiodarone drip. This was discontinued as his blood pressure dropped to 80s.  Patient also found to have hematochezia with an INR of 9.7.  Hemoglobin was found to be 11.7.  Patient remained hemodynamically stable for transfer to [facility] cardiology and GI consultation.  Significant Events: 9/22 Transferred to [facility] from [place] [facility]   Subjective: No new complaints today.  Is beginning his colonoscopy prep. Denies further appreciated bleeding.  No cp or sob.  I discussed the need for his INR to drop further before he can have endoscopy, and my fear that he will not be at our necessary INR threshold to allow endoscopy in the AM.   He is very much interested in getting his endoscopy completed asap.  I have offered to dose him w/ vitamin K to improve the likelihood of his INR being at goal for endo in AM.  He understands this may prolong  resumption of warfarin/therapeutic dosing on the back end of his stay, but accepts this possibility.    Assessment & Plan:  Acute blood loss anemia - Hematochezia / GIB No clear evidence of ongoing bleeding today - cont to follow Hgb in serial fashion - for endoscopic eval once INR will allow (hopeful for 9/25)  Supratherapeutic INR Likely related to interaction with recent antibiotic for UTI - once subtherapeutic levels reached will transition to IV heparin in light of mechanical heart valve     Multiple ICD shocks Cardiology feels these were triggered by Afib NOT a ventricular arrythmia - his AICD has been adjusted   Atrial Fibrillation w/ RVR Cards/EP following - cont BB - warfarin on hold due to bleeding - HR controlled today   Hypophosphatemia Resolved   Mechanical Aortic valve ( 2002) INR goal 2-3 - holding coumadin given bleeding - start heparin once INR subtherapeutic - to get one dose of Vitamin K this evening   Nonischemic cardiomyopathy  Umbilical hernia repair S/p mesh repair 09/2017.    DVT prophylaxis: warfarin > IV heparin  Code Status: FULL CODE Family Communication: no family present at time of exam  Disposition Plan: SDU  Consultants:  GI Cardiology - EP  Antimicrobials:  none  Objective: Blood pressure 121/68, pulse 92, temperature 97.8 F (36.6 C), temperature source Oral, resp. rate 20, height 5\' 8"  (1.727 m), weight 87.9 kg, SpO2 99 %.  Intake/Output Summary (Last 24 hours) at  11/07/2017 1703 Last data filed at 11/07/2017 1245 Gross per 24 hour  Intake 678 ml  Output -  Net 678 ml   Filed Weights   11/06/17 0400 11/07/17 0448  Weight: 88.4 kg 87.9 kg    Examination: General: No acute respiratory distress Lungs: CTA B - no wheezing  Cardiovascular: Regular rate with metallic click  Abdomen: Nontender, nondistended, soft, bowel sounds positive Extremities: No signif edema bilateral lower extremities  CBC: Recent Labs  Lab  11/05/17 1518  11/06/17 0926 11/06/17 1700 11/07/17 0517  WBC 7.4   < > 6.5 6.2 5.9  NEUTROABS 5.2  --   --   --   --   HGB 9.5*   < > 9.5* 8.5* 8.6*  HCT 29.6*   < > 29.3* 26.7* 26.8*  MCV 91.4   < > 92.1 92.7 93.4  PLT 101*   < > 146* 141* 158   < > = values in this interval not displayed.   Basic Metabolic Panel: Recent Labs  Lab 11/05/17 1518 11/06/17 0318 11/07/17 0517  NA 140 143 140  K 4.1 3.7 3.7  CL 110 111 108  CO2 21* 25 25  GLUCOSE 111* 109* 116*  BUN 47* 37* 22  CREATININE 1.20 1.17 1.08  CALCIUM 7.7* 7.9* 8.2*  MG 2.0  --   --   PHOS 1.7*  --  3.1   GFR: Estimated Creatinine Clearance: 61.7 mL/min (by C-G formula based on SCr of 1.08 mg/dL).  Liver Function Tests: Recent Labs  Lab 11/05/17 1518 11/07/17 0517  AST 94* 137*  ALT 51* 79*  ALKPHOS 61 67  BILITOT 0.9 1.0  PROT 5.1* 5.6*  ALBUMIN 2.6* 2.9*    Coagulation Profile: Recent Labs  Lab 11/05/17 1518 11/06/17 0318 11/07/17 0517  INR 8.35* 3.70 3.61    Recent Results (from the past 240 hour(s))  MRSA PCR Screening     Status: None   Collection Time: 11/05/17  2:08 PM  Result Value Ref Range Status   MRSA by PCR NEGATIVE NEGATIVE Final    Comment:        The GeneXpert MRSA Assay (FDA approved for NASAL specimens only), is one component of a comprehensive MRSA colonization surveillance program. It is not intended to diagnose MRSA infection nor to guide or monitor treatment for MRSA infections. Performed at [facility], [place] [address] [place],  [address]      Scheduled Meds: . atorvastatin  10 mg Oral q1800  . cholecalciferol  1,000 Units Oral Daily  . metoprolol succinate  75 mg Oral BID  . omega-3 acid ethyl esters  1,000 mg Oral Daily  . pantoprazole (PROTONIX) IV  40 mg Intravenous Q12H  . tamsulosin  0.4 mg Oral QHS  . vitamin C  500 mg Oral Daily     LOS: 2 days   Cherene Altes, MD [facility] Office  [phone] Pager - Text Page  per Amion  If 7PM-7AM, please contact night-coverage per Amion 11/07/2017, 5:03 PM

## 2017-11-07 NOTE — Progress Notes (Signed)
Subjective: The patient was seen and examined at bedside. No further rectal bleeding a bowel movement since admission. Denies abdominal pain, nausea or vomiting.  Objective: Vital signs in last 24 hours: Temp:  [97.5 F (36.4 C)-98.2 F (36.8 C)] 97.5 F (36.4 C) (09/24 0741) Pulse Rate:  [79-98] 83 (09/24 1246) Resp:  [20] 20 (09/23 1941) BP: (112-118)/(65-79) 117/66 (09/24 1246) SpO2:  [98 %-99 %] 98 % (09/24 0448) Weight:  [87.9 kg] 87.9 kg (09/24 0448) Weight change: -0.499 kg Last BM Date: 11/06/17  XB:MWUX pallor GENERAL:not in distress ABDOMEN:soft, nondistended, nontender, normoactive bowel sounds EXTREMITIES:no deformity  Lab Results: Results for orders placed or performed during the hospital encounter of 11/05/17 (from the past 48 hour(s))  MRSA PCR Screening     Status: None   Collection Time: 11/05/17  2:08 PM  Result Value Ref Range   MRSA by PCR NEGATIVE NEGATIVE    Comment:        The GeneXpert MRSA Assay (FDA approved for NASAL specimens only), is one component of a comprehensive MRSA colonization surveillance program. It is not intended to diagnose MRSA infection nor to guide or monitor treatment for MRSA infections. Performed at [facility], [place] [address] [place], [place] [address]   Magnesium     Status: None   Collection Time: 11/05/17  3:18 PM  Result Value Ref Range   Magnesium 2.0 1.7 - 2.4 mg/dL    Comment: Performed at [facility], [place] [address], [place], [place] [address]  Phosphorus     Status: Abnormal   Collection Time: 11/05/17  3:18 PM  Result Value Ref Range   Phosphorus 1.7 (L) 2.5 - 4.6 mg/dL    Comment: Performed at [facility] [address], [place], [place] [address]  CBC WITH DIFFERENTIAL     Status: Abnormal   Collection Time: 11/05/17  3:18 PM  Result Value Ref Range   WBC 7.4 4.0 - 10.5 K/uL   RBC 3.24 (L) 4.22 - 5.81 MIL/uL   Hemoglobin 9.5 (L) 13.0 - 17.0 g/dL   HCT 29.6 (L) 39.0 - 52.0 %    MCV 91.4 78.0 - 100.0 fL   MCH 29.3 26.0 - 34.0 pg   MCHC 32.1 30.0 - 36.0 g/dL   RDW 13.5 11.5 - 15.5 %   Platelets 101 (L) 150 - 400 K/uL    Comment: SPECIMEN CHECKED FOR CLOTS REPEATED TO VERIFY PLATELET COUNT CONFIRMED BY SMEAR    Neutrophils Relative % 72 %   Neutro Abs 5.2 1.7 - 7.7 K/uL   Lymphocytes Relative 14 %   Lymphs Abs 1.1 0.7 - 4.0 K/uL   Monocytes Relative 12 %   Monocytes Absolute 0.9 0.1 - 1.0 K/uL   Eosinophils Relative 1 %   Eosinophils Absolute 0.1 0.0 - 0.7 K/uL   Basophils Relative 0 %   Basophils Absolute 0.0 0.0 - 0.1 K/uL   Immature Granulocytes 1 %   Abs Immature Granulocytes 0.1 0.0 - 0.1 K/uL    Comment: Performed at [facility] [address], [place], [place] [address]  Comprehensive metabolic panel     Status: Abnormal   Collection Time: 11/05/17  3:18 PM  Result Value Ref Range   Sodium 140 135 - 145 mmol/L   Potassium 4.1 3.5 - 5.1 mmol/L   Chloride 110 98 - 111 mmol/L   CO2 21 (L) 22 - 32 mmol/L   Glucose, Bld 111 (H) 70 - 99  mg/dL   BUN 47 (H) 8 - 23 mg/dL   Creatinine, Ser 1.20 0.61 - 1.24 mg/dL   Calcium 7.7 (L) 8.9 - 10.3 mg/dL   Total Protein 5.1 (L) 6.5 - 8.1 g/dL   Albumin 2.6 (L) 3.5 - 5.0 g/dL   AST 94 (H) 15 - 41 U/L   ALT 51 (H) 0 - 44 U/L   Alkaline Phosphatase 61 38 - 126 U/L   Total Bilirubin 0.9 0.3 - 1.2 mg/dL   GFR calc non Af Amer 57 (L) >60 mL/min   GFR calc Af Amer >60 >60 mL/min    Comment: (NOTE) The eGFR has been calculated using the CKD EPI equation. This calculation has not been validated in all clinical situations. eGFR's persistently <60 mL/min signify possible Chronic Kidney Disease.    Anion gap 9 5 - 15    Comment: Performed at [facility] [address] [place], [place] [address]  Protime-INR     Status: Abnormal   Collection Time: 11/05/17  3:18 PM  Result Value Ref Range   Prothrombin Time 68.8 (H) 11.4 - 15.2 seconds   INR 8.35 (HH)     Comment: RESULT REPEATED AND  VERIFIED CRITICAL RESULT CALLED TO, READ BACK BY AND VERIFIED WITH: RN Chauncey Cruel COOPER AT [phone] MARTINB Performed at [facility], [address], [place], [place] [address]   Prepare fresh frozen plasma     Status: None   Collection Time: 11/05/17  3:27 PM  Result Value Ref Range   Unit Number [id number]    Blood Component Type THAWED PLASMA    Unit division 00    Status of Unit ISSUED,FINAL    Unit tag comment VERBAL ORDERS PER DR NETTEY    Transfusion Status OK TO TRANSFUSE    Unit Number [id number]    Blood Component Type THAWED PLASMA    Unit division 00    Status of Unit ISSUED,FINAL    Unit tag comment VERBAL ORDERS PER DR NETTEY    Transfusion Status      OK TO TRANSFUSE Performed at [facility], [place] [address] [place], [place] [address]   Type and screen [place]     Status: None   Collection Time: 11/05/17  3:29 PM  Result Value Ref Range   ABO/RH(D) A POS    Antibody Screen NEG    Sample Expiration      11/08/2017 Performed at [facility], [place] [address] [place], [place] [address]   ABO/Rh     Status: None   Collection Time: 11/05/17  3:29 PM  Result Value Ref Range   ABO/RH(D)      A POS Performed at [facility] [address], [place], [place] [address]   CBC     Status: Abnormal   Collection Time: 11/05/17  8:52 PM  Result Value Ref Range   WBC 8.3 4.0 - 10.5 K/uL   RBC 3.29 (L) 4.22 - 5.81 MIL/uL   Hemoglobin 9.9 (L) 13.0 - 17.0 g/dL   HCT 29.9 (L) 39.0 - 52.0 %   MCV 90.9 78.0 - 100.0 fL   MCH 30.1 26.0 - 34.0 pg   MCHC 33.1 30.0 - 36.0 g/dL   RDW 13.5 11.5 - 15.5 %   Platelets 126 (L) 150 - 400 K/uL    Comment: Performed at [facility] [address], [place], [place] [address]  Basic metabolic panel  Status: Abnormal   Collection Time: 11/06/17  3:18 AM  Result Value Ref Range   Sodium 143 135 - 145 mmol/L   Potassium 3.7 3.5 - 5.1 mmol/L   Chloride 111 98 - 111 mmol/L   CO2  25 22 - 32 mmol/L   Glucose, Bld 109 (H) 70 - 99 mg/dL   BUN 37 (H) 8 - 23 mg/dL   Creatinine, Ser 1.17 0.61 - 1.24 mg/dL   Calcium 7.9 (L) 8.9 - 10.3 mg/dL   GFR calc non Af Amer 58 (L) >60 mL/min   GFR calc Af Amer >60 >60 mL/min    Comment: (NOTE) The eGFR has been calculated using the CKD EPI equation. This calculation has not been validated in all clinical situations. eGFR's persistently <60 mL/min signify possible Chronic Kidney Disease.    Anion gap 7 5 - 15    Comment: Performed at [facility] [address], [place], [place] [address]  Protime-INR     Status: Abnormal   Collection Time: 11/06/17  3:18 AM  Result Value Ref Range   Prothrombin Time 36.4 (H) 11.4 - 15.2 seconds   INR 3.70     Comment: Performed at [facility] [address], [place], [place] [address]  CBC     Status: Abnormal   Collection Time: 11/06/17  3:18 AM  Result Value Ref Range   WBC 6.0 4.0 - 10.5 K/uL   RBC 2.84 (L) 4.22 - 5.81 MIL/uL   Hemoglobin 8.4 (L) 13.0 - 17.0 g/dL   HCT 26.0 (L) 39.0 - 52.0 %   MCV 91.5 78.0 - 100.0 fL   MCH 29.6 26.0 - 34.0 pg   MCHC 32.3 30.0 - 36.0 g/dL   RDW 13.6 11.5 - 15.5 %   Platelets 116 (L) 150 - 400 K/uL    Comment: PLATELET COUNT CONFIRMED BY SMEAR Performed at [facility], [place] [address] [place], [place] [address]   CBC     Status: Abnormal   Collection Time: 11/06/17  9:26 AM  Result Value Ref Range   WBC 6.5 4.0 - 10.5 K/uL   RBC 3.18 (L) 4.22 - 5.81 MIL/uL   Hemoglobin 9.5 (L) 13.0 - 17.0 g/dL   HCT 29.3 (L) 39.0 - 52.0 %   MCV 92.1 78.0 - 100.0 fL   MCH 29.9 26.0 - 34.0 pg   MCHC 32.4 30.0 - 36.0 g/dL   RDW 13.8 11.5 - 15.5 %   Platelets 146 (L) 150 - 400 K/uL    Comment: Performed at [facility], [place] [address] [place], [place] [address]  Provider-confirm verbal Blood Bank order - Type & Screen, FFP; 2 Units; Order taken: 11/05/2017; 3:24 PM; Emergency Release, STAT 2 units of A plasmas emergency released to the  ER @ 1535. All units were transfused.     Status: None   Collection Time: 11/06/17 10:43 AM  Result Value Ref Range   Blood product order confirm      MD AUTHORIZATION REQUESTED Performed at [facility] [address] [place], [place] [address]   CBC     Status: Abnormal   Collection Time: 11/06/17  5:00 PM  Result Value Ref Range   WBC 6.2 4.0 - 10.5 K/uL   RBC 2.88 (L) 4.22 - 5.81 MIL/uL   Hemoglobin 8.5 (L) 13.0 - 17.0 g/dL   HCT 26.7 (L) 39.0 - 52.0 %   MCV 92.7 78.0 - 100.0 fL  MCH 29.5 26.0 - 34.0 pg   MCHC 31.8 30.0 - 36.0 g/dL   RDW 13.7 11.5 - 15.5 %   Platelets 141 (L) 150 - 400 K/uL    Comment: Performed at [facility], [place] [address] [place], [place] [address]  Protime-INR     Status: Abnormal   Collection Time: 11/07/17  5:17 AM  Result Value Ref Range   Prothrombin Time 35.7 (H) 11.4 - 15.2 seconds   INR 3.61     Comment: Performed at [facility] [address] [place] [address], [place], [place] [address]  CBC     Status: Abnormal   Collection Time: 11/07/17  5:17 AM  Result Value Ref Range   WBC 5.9 4.0 - 10.5 K/uL   RBC 2.87 (L) 4.22 - 5.81 MIL/uL   Hemoglobin 8.6 (L) 13.0 - 17.0 g/dL   HCT 26.8 (L) 39.0 - 52.0 %   MCV 93.4 78.0 - 100.0 fL   MCH 30.0 26.0 - 34.0 pg   MCHC 32.1 30.0 - 36.0 g/dL   RDW 13.6 11.5 - 15.5 %   Platelets 158 150 - 400 K/uL    Comment: Performed at [facility], [place] [address], [place], [place] [address]  Phosphorus     Status: None   Collection Time: 11/07/17  5:17 AM  Result Value Ref Range   Phosphorus 3.1 2.5 - 4.6 mg/dL    Comment: Performed at [facility] [address] [place], [place] [address]  Comprehensive metabolic panel     Status: Abnormal   Collection Time: 11/07/17  5:17 AM  Result Value Ref Range   Sodium 140 135 - 145 mmol/L   Potassium 3.7 3.5 - 5.1 mmol/L   Chloride 108 98 - 111 mmol/L   CO2 25 22 - 32 mmol/L   Glucose, Bld 116 (H) 70 - 99 mg/dL   BUN 22 8 - 23 mg/dL   Creatinine,  Ser 1.08 0.61 - 1.24 mg/dL   Calcium 8.2 (L) 8.9 - 10.3 mg/dL   Total Protein 5.6 (L) 6.5 - 8.1 g/dL   Albumin 2.9 (L) 3.5 - 5.0 g/dL   AST 137 (H) 15 - 41 U/L   ALT 79 (H) 0 - 44 U/L   Alkaline Phosphatase 67 38 - 126 U/L   Total Bilirubin 1.0 0.3 - 1.2 mg/dL   GFR calc non Af Amer >60 >60 mL/min   GFR calc Af Amer >60 >60 mL/min    Comment: (NOTE) The eGFR has been calculated using the CKD EPI equation. This calculation has not been validated in all clinical situations. eGFR's persistently <60 mL/min signify possible Chronic Kidney Disease.    Anion gap 7 5 - 15    Comment: Performed at [facility] [address], [place],  [address]    Studies/Results: No results found.  Medications: I have reviewed the patient's current medications.  Assessment: Hematochezia and one episode of hematemesis  Plan: Clear liquid diet, nothing by mouth past midnight for EGD and colonoscopy in a.m. if INR is less than 2. If INR tomorrow is more than 2, plan to reschedule procedure for Thursday. Discussed the risks and benefits of the procedures with the patient in details, he understands and verbalizes consent.   Ronnette Juniper 11/07/2017, 1:08 PM   Pager [phone] If no answer or after 5 PM call [phone]

## 2017-11-07 NOTE — H&P (View-Only) (Signed)
Subjective: The patient was seen and examined at bedside. No further rectal bleeding a bowel movement since admission. Denies abdominal pain, nausea or vomiting.  Objective: Vital signs in last 24 hours: Temp:  [97.5 F (36.4 C)-98.2 F (36.8 C)] 97.5 F (36.4 C) (09/24 0741) Pulse Rate:  [79-98] 83 (09/24 1246) Resp:  [20] 20 (09/23 1941) BP: (112-118)/(65-79) 117/66 (09/24 1246) SpO2:  [98 %-99 %] 98 % (09/24 0448) Weight:  [87.9 kg] 87.9 kg (09/24 0448) Weight change: -0.499 kg Last BM Date: 11/06/17  XB:MWUX pallor GENERAL:not in distress ABDOMEN:soft, nondistended, nontender, normoactive bowel sounds EXTREMITIES:no deformity  Lab Results: Results for orders placed or performed during the hospital encounter of 11/05/17 (from the past 48 hour(s))  MRSA PCR Screening     Status: None   Collection Time: 11/05/17  2:08 PM  Result Value Ref Range   MRSA by PCR NEGATIVE NEGATIVE    Comment:        The GeneXpert MRSA Assay (FDA approved for NASAL specimens only), is one component of a comprehensive MRSA colonization surveillance program. It is not intended to diagnose MRSA infection nor to guide or monitor treatment for MRSA infections. Performed at [facility], [place] [address] [place], [place] [address]   Magnesium     Status: None   Collection Time: 11/05/17  3:18 PM  Result Value Ref Range   Magnesium 2.0 1.7 - 2.4 mg/dL    Comment: Performed at [facility], [place] [address], [place], [place] [address]  Phosphorus     Status: Abnormal   Collection Time: 11/05/17  3:18 PM  Result Value Ref Range   Phosphorus 1.7 (L) 2.5 - 4.6 mg/dL    Comment: Performed at [facility] [address], [place], [place] [address]  CBC WITH DIFFERENTIAL     Status: Abnormal   Collection Time: 11/05/17  3:18 PM  Result Value Ref Range   WBC 7.4 4.0 - 10.5 K/uL   RBC 3.24 (L) 4.22 - 5.81 MIL/uL   Hemoglobin 9.5 (L) 13.0 - 17.0 g/dL   HCT 29.6 (L) 39.0 - 52.0 %    MCV 91.4 78.0 - 100.0 fL   MCH 29.3 26.0 - 34.0 pg   MCHC 32.1 30.0 - 36.0 g/dL   RDW 13.5 11.5 - 15.5 %   Platelets 101 (L) 150 - 400 K/uL    Comment: SPECIMEN CHECKED FOR CLOTS REPEATED TO VERIFY PLATELET COUNT CONFIRMED BY SMEAR    Neutrophils Relative % 72 %   Neutro Abs 5.2 1.7 - 7.7 K/uL   Lymphocytes Relative 14 %   Lymphs Abs 1.1 0.7 - 4.0 K/uL   Monocytes Relative 12 %   Monocytes Absolute 0.9 0.1 - 1.0 K/uL   Eosinophils Relative 1 %   Eosinophils Absolute 0.1 0.0 - 0.7 K/uL   Basophils Relative 0 %   Basophils Absolute 0.0 0.0 - 0.1 K/uL   Immature Granulocytes 1 %   Abs Immature Granulocytes 0.1 0.0 - 0.1 K/uL    Comment: Performed at [facility] [address], [place], [place] [address]  Comprehensive metabolic panel     Status: Abnormal   Collection Time: 11/05/17  3:18 PM  Result Value Ref Range   Sodium 140 135 - 145 mmol/L   Potassium 4.1 3.5 - 5.1 mmol/L   Chloride 110 98 - 111 mmol/L   CO2 21 (L) 22 - 32 mmol/L   Glucose, Bld 111 (H) 70 - 99  mg/dL   BUN 47 (H) 8 - 23 mg/dL   Creatinine, Ser 1.20 0.61 - 1.24 mg/dL   Calcium 7.7 (L) 8.9 - 10.3 mg/dL   Total Protein 5.1 (L) 6.5 - 8.1 g/dL   Albumin 2.6 (L) 3.5 - 5.0 g/dL   AST 94 (H) 15 - 41 U/L   ALT 51 (H) 0 - 44 U/L   Alkaline Phosphatase 61 38 - 126 U/L   Total Bilirubin 0.9 0.3 - 1.2 mg/dL   GFR calc non Af Amer 57 (L) >60 mL/min   GFR calc Af Amer >60 >60 mL/min    Comment: (NOTE) The eGFR has been calculated using the CKD EPI equation. This calculation has not been validated in all clinical situations. eGFR's persistently <60 mL/min signify possible Chronic Kidney Disease.    Anion gap 9 5 - 15    Comment: Performed at [facility] [address] [place], [place] [address]  Protime-INR     Status: Abnormal   Collection Time: 11/05/17  3:18 PM  Result Value Ref Range   Prothrombin Time 68.8 (H) 11.4 - 15.2 seconds   INR 8.35 (HH)     Comment: RESULT REPEATED AND  VERIFIED CRITICAL RESULT CALLED TO, READ BACK BY AND VERIFIED WITH: RN Chauncey Cruel COOPER AT [phone] [id number] MARTINB Performed at [facility], [address], [place], [place] [address]   Prepare fresh frozen plasma     Status: None   Collection Time: 11/05/17  3:27 PM  Result Value Ref Range   Unit Number [id number]    Blood Component Type THAWED PLASMA    Unit division 00    Status of Unit ISSUED,FINAL    Unit tag comment VERBAL ORDERS PER DR NETTEY    Transfusion Status OK TO TRANSFUSE    Unit Number [id number]    Blood Component Type THAWED PLASMA    Unit division 00    Status of Unit ISSUED,FINAL    Unit tag comment VERBAL ORDERS PER DR NETTEY    Transfusion Status      OK TO TRANSFUSE Performed at [facility], [place] [address] [place], [place] [address]   Type and screen [place]     Status: None   Collection Time: 11/05/17  3:29 PM  Result Value Ref Range   ABO/RH(D) A POS    Antibody Screen NEG    Sample Expiration      11/08/2017 Performed at [facility], [place] [address] [place], [place] [address]   ABO/Rh     Status: None   Collection Time: 11/05/17  3:29 PM  Result Value Ref Range   ABO/RH(D)      A POS Performed at [facility] [address], [place], [place] [address]   CBC     Status: Abnormal   Collection Time: 11/05/17  8:52 PM  Result Value Ref Range   WBC 8.3 4.0 - 10.5 K/uL   RBC 3.29 (L) 4.22 - 5.81 MIL/uL   Hemoglobin 9.9 (L) 13.0 - 17.0 g/dL   HCT 29.9 (L) 39.0 - 52.0 %   MCV 90.9 78.0 - 100.0 fL   MCH 30.1 26.0 - 34.0 pg   MCHC 33.1 30.0 - 36.0 g/dL   RDW 13.5 11.5 - 15.5 %   Platelets 126 (L) 150 - 400 K/uL    Comment: Performed at [facility] [address], [place], [place] [address]  Basic metabolic panel  Status: Abnormal   Collection Time: 11/06/17  3:18 AM  Result Value Ref Range   Sodium 143 135 - 145 mmol/L   Potassium 3.7 3.5 - 5.1 mmol/L   Chloride 111 98 - 111 mmol/L   CO2  25 22 - 32 mmol/L   Glucose, Bld 109 (H) 70 - 99 mg/dL   BUN 37 (H) 8 - 23 mg/dL   Creatinine, Ser 1.17 0.61 - 1.24 mg/dL   Calcium 7.9 (L) 8.9 - 10.3 mg/dL   GFR calc non Af Amer 58 (L) >60 mL/min   GFR calc Af Amer >60 >60 mL/min    Comment: (NOTE) The eGFR has been calculated using the CKD EPI equation. This calculation has not been validated in all clinical situations. eGFR's persistently <60 mL/min signify possible Chronic Kidney Disease.    Anion gap 7 5 - 15    Comment: Performed at [facility] [address], [place], [place] [address]  Protime-INR     Status: Abnormal   Collection Time: 11/06/17  3:18 AM  Result Value Ref Range   Prothrombin Time 36.4 (H) 11.4 - 15.2 seconds   INR 3.70     Comment: Performed at [facility] [address], [place], [place] [address]  CBC     Status: Abnormal   Collection Time: 11/06/17  3:18 AM  Result Value Ref Range   WBC 6.0 4.0 - 10.5 K/uL   RBC 2.84 (L) 4.22 - 5.81 MIL/uL   Hemoglobin 8.4 (L) 13.0 - 17.0 g/dL   HCT 26.0 (L) 39.0 - 52.0 %   MCV 91.5 78.0 - 100.0 fL   MCH 29.6 26.0 - 34.0 pg   MCHC 32.3 30.0 - 36.0 g/dL   RDW 13.6 11.5 - 15.5 %   Platelets 116 (L) 150 - 400 K/uL    Comment: PLATELET COUNT CONFIRMED BY SMEAR Performed at [facility], [place] [address] [place], [place] [address]   CBC     Status: Abnormal   Collection Time: 11/06/17  9:26 AM  Result Value Ref Range   WBC 6.5 4.0 - 10.5 K/uL   RBC 3.18 (L) 4.22 - 5.81 MIL/uL   Hemoglobin 9.5 (L) 13.0 - 17.0 g/dL   HCT 29.3 (L) 39.0 - 52.0 %   MCV 92.1 78.0 - 100.0 fL   MCH 29.9 26.0 - 34.0 pg   MCHC 32.4 30.0 - 36.0 g/dL   RDW 13.8 11.5 - 15.5 %   Platelets 146 (L) 150 - 400 K/uL    Comment: Performed at [facility], [place] [address] [place], [place] [address]  Provider-confirm verbal Blood Bank order - Type & Screen, FFP; 2 Units; Order taken: 11/05/2017; 3:24 PM; Emergency Release, STAT 2 units of A plasmas emergency released to the  ER @ 1535. All units were transfused.     Status: None   Collection Time: 11/06/17 10:43 AM  Result Value Ref Range   Blood product order confirm      MD AUTHORIZATION REQUESTED Performed at [facility] [address] [place], [place] [address]   CBC     Status: Abnormal   Collection Time: 11/06/17  5:00 PM  Result Value Ref Range   WBC 6.2 4.0 - 10.5 K/uL   RBC 2.88 (L) 4.22 - 5.81 MIL/uL   Hemoglobin 8.5 (L) 13.0 - 17.0 g/dL   HCT 26.7 (L) 39.0 - 52.0 %   MCV 92.7 78.0 - 100.0 fL  MCH 29.5 26.0 - 34.0 pg   MCHC 31.8 30.0 - 36.0 g/dL   RDW 13.7 11.5 - 15.5 %   Platelets 141 (L) 150 - 400 K/uL    Comment: Performed at [facility], [place] [address] [place], [place] [address]  Protime-INR     Status: Abnormal   Collection Time: 11/07/17  5:17 AM  Result Value Ref Range   Prothrombin Time 35.7 (H) 11.4 - 15.2 seconds   INR 3.61     Comment: Performed at [facility] [address], [place], [place] [address]  CBC     Status: Abnormal   Collection Time: 11/07/17  5:17 AM  Result Value Ref Range   WBC 5.9 4.0 - 10.5 K/uL   RBC 2.87 (L) 4.22 - 5.81 MIL/uL   Hemoglobin 8.6 (L) 13.0 - 17.0 g/dL   HCT 26.8 (L) 39.0 - 52.0 %   MCV 93.4 78.0 - 100.0 fL   MCH 30.0 26.0 - 34.0 pg   MCHC 32.1 30.0 - 36.0 g/dL   RDW 13.6 11.5 - 15.5 %   Platelets 158 150 - 400 K/uL    Comment: Performed at [facility], [place] [address], [place], [place] [address]  Phosphorus     Status: None   Collection Time: 11/07/17  5:17 AM  Result Value Ref Range   Phosphorus 3.1 2.5 - 4.6 mg/dL    Comment: Performed at [facility] [address] [place], [place] [address]  Comprehensive metabolic panel     Status: Abnormal   Collection Time: 11/07/17  5:17 AM  Result Value Ref Range   Sodium 140 135 - 145 mmol/L   Potassium 3.7 3.5 - 5.1 mmol/L   Chloride 108 98 - 111 mmol/L   CO2 25 22 - 32 mmol/L   Glucose, Bld 116 (H) 70 - 99 mg/dL   BUN 22 8 - 23 mg/dL   Creatinine,  Ser 1.08 0.61 - 1.24 mg/dL   Calcium 8.2 (L) 8.9 - 10.3 mg/dL   Total Protein 5.6 (L) 6.5 - 8.1 g/dL   Albumin 2.9 (L) 3.5 - 5.0 g/dL   AST 137 (H) 15 - 41 U/L   ALT 79 (H) 0 - 44 U/L   Alkaline Phosphatase 67 38 - 126 U/L   Total Bilirubin 1.0 0.3 - 1.2 mg/dL   GFR calc non Af Amer >60 >60 mL/min   GFR calc Af Amer >60 >60 mL/min    Comment: (NOTE) The eGFR has been calculated using the CKD EPI equation. This calculation has not been validated in all clinical situations. eGFR's persistently <60 mL/min signify possible Chronic Kidney Disease.    Anion gap 7 5 - 15    Comment: Performed at [facility] [address], [place], [place] [address]    Studies/Results: No results found.  Medications: I have reviewed the patient's current medications.  Assessment: Hematochezia and one episode of hematemesis  Plan: Clear liquid diet, nothing by mouth past midnight for EGD and colonoscopy in a.m. if INR is less than 2. If INR tomorrow is more than 2, plan to reschedule procedure for Thursday. Discussed the risks and benefits of the procedures with the patient in details, he understands and verbalizes consent.   Ronnette Juniper 11/07/2017, 1:08 PM   Pager [phone] If no answer or after 5 PM call [phone]
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
## 2017-11-07 NOTE — Progress Notes (Addendum)
Progress Note  Patient Name: Timothy DOUBLEDAY Sr. Date of Encounter: 11/07/2017  Primary Cardiologist: Domenic Polite Primary Electrophysiologist: [name], MD  Subjective   No significant overnight events. Patient notes occasional palpitations after "moving around." He denies any chest pain, shortness of breath, lightheadedness, or dizziness. Patient denies any abdominal pain, hematochezia, hematuria, or hemoptysis.   Inpatient Medications    Scheduled Meds: . atorvastatin  10 mg Oral q1800  . cholecalciferol  1,000 Units Oral Daily  . metoprolol succinate  50 mg Oral BID  . omega-3 acid ethyl esters  1,000 mg Oral Daily  . pantoprazole (PROTONIX) IV  40 mg Intravenous Q12H  . tamsulosin  0.4 mg Oral QHS  . vitamin C  500 mg Oral Daily   Continuous Infusions:  PRN Meds: acetaminophen, meclizine, senna-docusate, traMADol   Vital Signs    Vitals:   11/06/17 1941 11/07/17 0000 11/07/17 0448 11/07/17 0741  BP: 117/69 118/69 113/69 112/65  Pulse: 88 79 86 80  Resp: 20     Temp: 97.8 F (36.6 C) 97.6 F (36.4 C) 97.8 F (36.6 C)   TempSrc: Oral Oral Oral   SpO2: 98% 98% 98%   Weight:   87.9 kg   Height:        Intake/Output Summary (Last 24 hours) at 11/07/2017 0932 Last data filed at 11/07/2017 0100 Gross per 24 hour  Intake 560 ml  Output -  Net 560 ml   Filed Weights   11/06/17 0400 11/07/17 0448  Weight: 88.4 kg 87.9 kg    Telemetry    Sinus rhythm with AV pacing - Personally Reviewed  Physical Exam   GEN: 77 year old overweight male resting comfortably in hospital bed in no acute distress.   Neck: Supply. Cardiac: RRR. No murmurs, rubs, or gallops.  Respiratory: No increased work of breathing. Clear to auscultation bilaterally. No wheezes, rhonchi, or rales.  GI: Abdomen soft, mildly distended, and non-tender to palpation. Bowel sounds presents.  MS: No lower extremity edema.  Neuro:  No focal deficits.  Psych: Normal affect.  Labs     Chemistry Recent Labs  Lab 11/05/17 1518 11/06/17 0318 11/07/17 0517  NA 140 143 140  K 4.1 3.7 3.7  CL 110 111 108  CO2 21* 25 25  GLUCOSE 111* 109* 116*  BUN 47* 37* 22  CREATININE 1.20 1.17 1.08  CALCIUM 7.7* 7.9* 8.2*  PROT 5.1*  --  5.6*  ALBUMIN 2.6*  --  2.9*  AST 94*  --  137*  ALT 51*  --  79*  ALKPHOS 61  --  67  BILITOT 0.9  --  1.0  GFRNONAA 57* 58* >60  GFRAA >60 >60 >60  ANIONGAP 9 7 7      Hematology Recent Labs  Lab 11/06/17 0926 11/06/17 1700 11/07/17 0517  WBC 6.5 6.2 5.9  RBC 3.18* 2.88* 2.87*  HGB 9.5* 8.5* 8.6*  HCT 29.3* 26.7* 26.8*  MCV 92.1 92.7 93.4  MCH 29.9 29.5 30.0  MCHC 32.4 31.8 32.1  RDW 13.8 13.7 13.6  PLT 146* 141* 158    Cardiac EnzymesNo results for input(s): TROPONINI in the last 168 hours. No results for input(s): TROPIPOC in the last 168 hours.   BNPNo results for input(s): BNP, PROBNP in the last 168 hours.   DDimer No results for input(s): DDIMER in the last 168 hours.   Radiology    No results found.  Cardiac Studies   ECHO 11/06/2017: Study Conclusions: - Left ventricle:  The cavity size was normal. Systolic function was   mildly to moderately reduced. The estimated ejection fraction was   in the range of 40% to 45%. - Aortic valve: Mildly calcified annulus. Moderately thickened,   moderately calcified leaflets. There was moderate stenosis. Valve   area (VTI): 1.59 cm^2. Valve area (Vmax): 1.61 cm^2. Valve area   (Vmean): 1.34 cm^2. - Mitral valve: There was mild regurgitation. - Left atrium: The atrium was moderately dilated. - Right atrium: The atrium was mildly dilated. - Pulmonary arteries: Systolic pressure was mildly increased. PA   peak pressure: 34 mm Hg (S).  Patient Profile     Timothy MATTERN Sr. is a 77 y.o. male with a hx of aortic aneurysm status post Bentall procedure in 2002 with mechanical AVR, coronary artery disease nonobstructive in 2002, hypertension, hyperlipidemia, nonischemic  cardiomyopathy status post [employer] CRT-D in 2014, and persistent atrial fibrillation who is being seen today for the evaluation of ICD shocks at the request of Oretha Milch.  Assessment & Plan    1.  Atrial Fibrillation with RVR  - Patient presented to [facility] ED after experiencing multiple ICD shocks and was found to be in wide-complex tachycardia presumed to be VT. Patient was also found to have hematochezia with a hemoglobin of 11.7 and INR of 9.7. Patient was hemodynamically stable and transferred to [facility] for Cardiology and GI consultation.  - With review of device interrogation, it appears shocks were likely due to rapid atrial fibrillation not VT. This is likely due to acute GI bleed with profound anemia.  - Patient reports occasional palpitations.  - Telemetry currently shows sinus rhyhm with AV pacing.  - Continue to hold Coumadin given GI bleed. - Will increase Toprol XL from 50mg  to 75mg  twice daily.  - Will start heparin drip once INR <2  2. Multiple ICD shocks for AF with RVR - ICD interrogation reviewed at length yesterday by Dr. Rayann Heman & VT zone adjusted from 180 bpm to a single VF zone of 222 bpm to minimize shocks.  He has never had appropriate ICD therapy prior.  This should reduce the opportunity for shocks for atrial fibrillation in the future.  3. GI Bleed - Hgb 8.6 and INR 3.61 today. ->  Provided hemoglobin level stays stable, plan is to avoid transfusion unless Hgb drops below 8. - Per GI note, plans for EGD/colonoscopy likely tomorrow with hopes that INR will be around 1.8 by then. - Will start heparin drip once INR <2.  4. Chronic Systolic Dysfunction / Nonischemic Cardiomyopathy s/p ICD - Echo 11/06/2017 showed left ventricle with mildly to moderately reduced systolic function with EF of 40-45%; moderately dilated left atrium; mildly dilated right atrium; moderate aortic stenosis; mild mitral regurgitation; PASP mildly increased at 34 mmHg. - No  evidence of volume overload on exam.  - Increase Toprol XL at above.   - Will continue to hold Lasix and Losartan at this time given potential hypovolemia in setting of GI bleed. BP stable at 112/65 this morning. -->  He indicates to me that he was only taking Lasix as needed in the outpatient setting.  At present, no indication for restarting diuretic   5. Mechanic AVR - Continue to hold Coumadin at this time given GI bleed. - Will start heparin drip once INR <2.    Signed, Darreld Mclean, PA-C  11/07/2017, 9:32 AM     I have seen, examined and evaluated the patient this AM along with  Darreld Mclean, PA-C .  After reviewing all the available data and chart, we discussed the patients laboratory, study & physical findings as well as symptoms in detail. I agree with her findings, examination as well as impression recommendations as per our discussion.    Attending adjustments noted in italics.   He seems to be doing well.  Starting to get bored.  Just waiting for his test. We will simply gently titrate up beta-blocker dose otherwise no changes.   Glenetta Hew, M.D., M.S. Interventional Cardiologist   Pager # [phone] Phone # [phone] [address] [place], [place] [address]      For questions or updates, please contact [facility] Please consult [email or web address] for contact info under

## 2017-11-08 ENCOUNTER — Inpatient Hospital Stay (HOSPITAL_COMMUNITY): Payer: Medicare HMO | Admitting: Anesthesiology

## 2017-11-08 ENCOUNTER — Encounter (HOSPITAL_COMMUNITY)
Admission: AD | Disposition: A | Discharge status: Home / Self Care | Payer: Self-pay | Source: Other Acute Inpatient Hospital | Attending: Internal Medicine

## 2017-11-08 ENCOUNTER — Encounter (HOSPITAL_COMMUNITY): Payer: Self-pay | Admitting: *Deleted

## 2017-11-08 DIAGNOSIS — I10 Essential (primary) hypertension: Secondary | ICD-10-CM

## 2017-11-08 HISTORY — PX: ESOPHAGOGASTRODUODENOSCOPY (EGD) WITH PROPOFOL: SHX5813

## 2017-11-08 HISTORY — PX: COLONOSCOPY WITH PROPOFOL: SHX5780

## 2017-11-08 HISTORY — PX: POLYPECTOMY: SHX5525

## 2017-11-08 LAB — CBC
HEMATOCRIT: 26.6 % — AB (ref 39.0–52.0)
Hemoglobin: 8.5 g/dL — ABNORMAL LOW (ref 13.0–17.0)
MCH: 29.7 pg (ref 26.0–34.0)
MCHC: 32 g/dL (ref 30.0–36.0)
MCV: 93 fL (ref 78.0–100.0)
Platelets: 216 10*3/uL (ref 150–400)
RBC: 2.86 MIL/uL — ABNORMAL LOW (ref 4.22–5.81)
RDW: 13.5 % (ref 11.5–15.5)
WBC: 8 10*3/uL (ref 4.0–10.5)

## 2017-11-08 LAB — COMPREHENSIVE METABOLIC PANEL
ALBUMIN: 2.9 g/dL — AB (ref 3.5–5.0)
ALT: 69 U/L — ABNORMAL HIGH (ref 0–44)
ANION GAP: 7 (ref 5–15)
AST: 107 U/L — AB (ref 15–41)
Alkaline Phosphatase: 67 U/L (ref 38–126)
BILIRUBIN TOTAL: 1.2 mg/dL (ref 0.3–1.2)
BUN: 15 mg/dL (ref 8–23)
CO2: 25 mmol/L (ref 22–32)
Calcium: 8.2 mg/dL — ABNORMAL LOW (ref 8.9–10.3)
Chloride: 107 mmol/L (ref 98–111)
Creatinine, Ser: 1.02 mg/dL (ref 0.61–1.24)
GFR calc Af Amer: >60 mL/min (ref >60)
GLUCOSE: 99 mg/dL (ref 70–99)
POTASSIUM: 4.1 mmol/L (ref 3.5–5.1)
Sodium: 139 mmol/L (ref 135–145)
TOTAL PROTEIN: 5.6 g/dL — AB (ref 6.5–8.1)

## 2017-11-08 LAB — HEPARIN LEVEL (UNFRACTIONATED): HEPARIN UNFRACTIONATED: 0.33 [IU]/mL (ref 0.30–0.70)

## 2017-11-08 LAB — PROTIME-INR
INR: 1.6
PROTHROMBIN TIME: 18.9 s — AB (ref 11.4–15.2)

## 2017-11-08 SURGERY — ESOPHAGOGASTRODUODENOSCOPY (EGD) WITH PROPOFOL
Anesthesia: Monitor Anesthesia Care

## 2017-11-08 MED ORDER — PROPOFOL 500 MG/50ML IV EMUL
INTRAVENOUS | Status: DC | PRN
  Administered 2017-11-08: 100 ug/kg/min via INTRAVENOUS

## 2017-11-08 MED ORDER — HEPARIN (PORCINE) IN NACL 100-0.45 UNIT/ML-% IJ SOLN
1300.0000 [IU]/h | INTRAMUSCULAR | Status: DC
  Administered 2017-11-08 – 2017-11-09 (×3): 1300 [IU]/h via INTRAVENOUS
  Filled 2017-11-08 (×3): qty 250

## 2017-11-08 MED ORDER — PANTOPRAZOLE SODIUM 40 MG PO TBEC
40.0000 mg | DELAYED_RELEASE_TABLET | Freq: Two times a day (BID) | ORAL | Status: DC
  Administered 2017-11-08 – 2017-11-10 (×4): 40 mg via ORAL
  Filled 2017-11-08 (×4): qty 1

## 2017-11-08 MED ORDER — METOPROLOL SUCCINATE ER 100 MG PO TB24
100.0000 mg | ORAL_TABLET | Freq: Two times a day (BID) | ORAL | Status: DC
  Administered 2017-11-08 – 2017-11-18 (×20): 100 mg via ORAL
  Filled 2017-11-08 (×20): qty 1

## 2017-11-08 MED ORDER — PROPOFOL 10 MG/ML IV BOLUS
INTRAVENOUS | Status: DC | PRN
  Administered 2017-11-08: 20 mg via INTRAVENOUS
  Administered 2017-11-08 (×2): 10 mg via INTRAVENOUS

## 2017-11-08 MED ORDER — METOPROLOL TARTRATE 5 MG/5ML IV SOLN
5.0000 mg | Freq: Once | INTRAVENOUS | Status: AC
  Administered 2017-11-08 (×2): 2.5 mg via INTRAVENOUS
  Filled 2017-11-08: qty 5

## 2017-11-08 MED ORDER — LIDOCAINE HCL (CARDIAC) PF 100 MG/5ML IV SOSY
PREFILLED_SYRINGE | INTRAVENOUS | Status: DC | PRN
  Administered 2017-11-08: 30 mg via INTRAVENOUS

## 2017-11-08 MED ORDER — WARFARIN - PHARMACIST DOSING INPATIENT
Freq: Every day | Status: DC
  Administered 2017-11-08: 17:00:00

## 2017-11-08 MED ORDER — PHENYLEPHRINE HCL 10 MG/ML IJ SOLN
INTRAMUSCULAR | Status: DC | PRN
  Administered 2017-11-08: 80 ug via INTRAVENOUS

## 2017-11-08 MED ORDER — WARFARIN SODIUM 7.5 MG PO TABS
7.5000 mg | ORAL_TABLET | Freq: Once | ORAL | Status: AC
  Administered 2017-11-08: 7.5 mg via ORAL
  Filled 2017-11-08: qty 1

## 2017-11-08 MED ORDER — FERROUS SULFATE 325 (65 FE) MG PO TABS
325.0000 mg | ORAL_TABLET | ORAL | Status: DC
  Administered 2017-11-09 – 2017-11-17 (×5): 325 mg via ORAL
  Filled 2017-11-08 (×7): qty 1

## 2017-11-08 MED ORDER — LACTATED RINGERS IV SOLN
INTRAVENOUS | Status: DC | PRN
  Administered 2017-11-08: 08:00:00 via INTRAVENOUS

## 2017-11-08 MED ORDER — SODIUM CHLORIDE 0.9 % IV SOLN: INTRAVENOUS | Status: DC

## 2017-11-08 SURGICAL SUPPLY — 25 items

## 2017-11-08 NOTE — Anesthesia Postprocedure Evaluation (Signed)
Anesthesia Post Note  Patient: Timothy CAMACHO Sr.  Procedure(s) Performed: ESOPHAGOGASTRODUODENOSCOPY (EGD) WITH PROPOFOL Needs INR<2 (N/A ) COLONOSCOPY WITH PROPOFOL (N/A ) POLYPECTOMY     Patient location during evaluation: PACU Anesthesia Type: MAC Level of consciousness: awake and alert Pain management: pain level controlled Vital Signs Assessment: post-procedure vital signs reviewed and stable Respiratory status: spontaneous breathing and respiratory function stable Cardiovascular status: stable Postop Assessment: no apparent nausea or vomiting Anesthetic complications: no    Last Vitals:  Vitals:   11/08/17 0955 11/08/17 1005  BP: 109/74 125/68  Pulse: 84   Resp: 16   Temp:    SpO2: 100%     Last Pain:  Vitals:   11/08/17 1005  TempSrc:   PainSc: 0-No pain                 Lilyauna Miedema DANIEL

## 2017-11-08 NOTE — Op Note (Signed)
EGD was performed for hematemesis.  Findings: Normal esophagus, regular Z line at 40 cm from incisors. 3 nonbleeding slightly cratered but mostly superficial gastric ulcers in the antrum, largest 8 mm in dimension. Cardiac fundus normal on retroflexion. 3 large nonbleeding superficial duodenal ulcers with pigmented material found in the duodenal bulb and first portion of duodenum. Largest ulcer was 2 cm 3 cm in size. Not amenable for endoscopic treatment due to their is large size, not bleeding at present.  Colonoscopy was performed for hematochezia. Findings: A 6 mm polyp noted to transverse colon, removed via hot snare polypectomy. A 9 mm polyp noted in rectum, removed via hot snare polypectomy. Several scattered small and large mouth diverticula noted in sigmoid and descending colon. The rest of the colon and retroflexion was unremarkable   Recommendations: PPI twice a day for 2 months, then indefinitely while patient is on Coumadin. H pylori stool antigen and treat if positive. Okay to resume regular diet. Patient to be started on heparin drip today, okay to transition to Coumadin, however remains at increased risk of bleeding due to multiple large ulcers noted in duodenal bulb.  Ronnette Juniper, M.D.

## 2017-11-08 NOTE — Progress Notes (Signed)
ANTICOAGULATION CONSULT NOTE - Follow Up Consult  Pharmacy Consult for heparin Indication: Afib/AVR  Labs: Recent Labs    11/05/17 1518  11/06/17 0318  11/06/17 1700 11/07/17 0517 11/08/17 0427  HGB 9.5*   < > 8.4*   < > 8.5* 8.6* 8.5*  HCT 29.6*   < > 26.0*   < > 26.7* 26.8* 26.6*  PLT 101*   < > 116*   < > 141* 158 216  LABPROT 68.8*  --  36.4*  --   --  35.7* 18.9*  INR 8.35*  --  3.70  --   --  3.61 1.60  CREATININE 1.20  --  1.17  --   --  1.08  --    < > = values in this interval not displayed.    Assessment: 77yo male to begin heparin bridge now that INR is 1.6.  Goal of Therapy:  Heparin level 0.3-0.7 units/ml Monitor platelets by anticoagulation protocol: Yes   Plan:  Will start heparin gtt at 1300 units/hr and monitor heparin levels and CBC.  Wynona Neat, PharmD, BCPS  11/08/2017,5:08 AM

## 2017-11-08 NOTE — Progress Notes (Signed)
[place] for Heparin>>warfarin Indication: Mechanical AVR + Afib  No Known Allergies  Patient Measurements: Height: 5\' 8"  (172.7 cm) Weight: 195 lb 3.2 oz (88.5 kg) IBW/kg (Calculated) : 68.4 Heparin Dosing Weight: 86.4 kg  Vital Signs: Temp: 97.6 F (36.4 C) (09/25 1158) Temp Source: Oral (09/25 1158) BP: 115/93 (09/25 1158) Pulse Rate: 86 (09/25 1158)  Labs: Recent Labs    11/06/17 0318  11/06/17 1700 11/07/17 0517 11/08/17 0427  HGB 8.4*   < > 8.5* 8.6* 8.5*  HCT 26.0*   < > 26.7* 26.8* 26.6*  PLT 116*   < > 141* 158 216  LABPROT 36.4*  --   --  35.7* 18.9*  INR 3.70  --   --  3.61 1.60  CREATININE 1.17  --   --  1.08 1.02   < > = values in this interval not displayed.    Estimated Creatinine Clearance: 65.5 mL/min (by C-G formula based on SCr of 1.02 mg/dL).   Medical History: Past Medical History:  Diagnosis Date  . AICD (automatic cardioverter/defibrillator) present 07/11/2012  . Aortic aneurysm, thoracic ([place])    Fusiform s/p Bentall procedure 2002  . Aortic valve, bicuspid    Mechanical AVR  . Basal cell carcinoma of face    "burned off" (11/06/2017)  . BPH (benign prostatic hypertrophy)   . CHF (congestive heart failure) ([place])    "once" (11/06/2017)  . Coronary atherosclerosis of native coronary artery    Nonobstructive at cardiac catherization 2002  . Essential hypertension, benign   . Left bundle branch block   . Mixed hyperlipidemia   . Nonischemic cardiomyopathy (HCC)    CRT-D  . Persistent atrial fibrillation ([place])   . Presence of permanent cardiac pacemaker     Assessment: 40 YOF who presented on 9/22 with ICD shocks and concern for lower GIB. The patient was on warfarin PTA for hx mech AVR + Afib. Admit INR elevated at 8.35 in the setting of recent outpatient Bactrim on PTA dosing of 5 mg daily.   Patient now s/p EGD and colonoscopy this morning 9/25. Two polyps removed scattered diverticula noted  in colon. PPI to continue, heparin restarted post procedure, ok with GI to resume warfarin.   D/w primary team and cardiology the possibility of lovenox bridge. Cardiology feels appropriate to continue heparin for tonight and reconsider lovenox in am. Will give dose of warfarin tonight.     Goal of Therapy:  Heparin goal 0.3-0.7 INR goal 2.5-3.5 Monitor platelets by anticoagulation protocol: Yes   Plan:  Warfarin 7.5mg  tonight Heparin currently at 1300/hr Heparin level this evening  Erin Hearing PharmD., BCPS Clinical Pharmacist 11/08/2017 4:06 PM

## 2017-11-08 NOTE — Transfer of Care (Signed)
Immediate Anesthesia Transfer of Care Note  Patient: Timothy WILLMORE Sr.  Procedure(s) Performed: ESOPHAGOGASTRODUODENOSCOPY (EGD) WITH PROPOFOL Needs INR<2 (N/A ) COLONOSCOPY WITH PROPOFOL (N/A ) POLYPECTOMY  Patient Location: Endoscopy Unit  Anesthesia Type:MAC  Level of Consciousness: awake, alert  and oriented  Airway & Oxygen Therapy: Patient Spontanous Breathing and Patient connected to nasal cannula oxygen  Post-op Assessment: Report given to RN, Post -op Vital signs reviewed and stable and Patient moving all extremities X 4  Post vital signs: Reviewed and stable  Last Vitals:  Vitals Value Taken Time  BP 90/63 11/08/2017  9:46 AM  Temp 36.4 C 11/08/2017  9:46 AM  Pulse 88 11/08/2017  9:46 AM  Resp 15 11/08/2017  9:46 AM  SpO2 100 % 11/08/2017  9:46 AM    Last Pain:  Vitals:   11/08/17 0946  TempSrc: Oral  PainSc: 0-No pain      Patients Stated Pain Goal: 0 (66/06/00 4599)  Complications: No apparent anesthesia complications

## 2017-11-08 NOTE — Anesthesia Preprocedure Evaluation (Addendum)
Anesthesia Evaluation  Patient identified by MRN, date of birth, ID band Patient awake    Reviewed: Allergy & Precautions, NPO status , Patient's Chart, lab work & pertinent test results  History of Anesthesia Complications Negative for: history of anesthetic complications  Airway Mallampati: II  TM Distance: >3 FB Neck ROM: Full    Dental  (+) Partial Upper, Dental Advisory Given   Pulmonary former smoker,    Pulmonary exam normal        Cardiovascular hypertension, + CAD and +CHF  Normal cardiovascular exam+ dysrhythmias Atrial Fibrillation + pacemaker + Cardiac Defibrillator   Study Conclusions  - Left ventricle: The cavity size was normal. Systolic function was   mildly to moderately reduced. The estimated ejection fraction was   in the range of 40% to 45%. - Aortic valve: Mildly calcified annulus. Moderately thickened,   moderately calcified leaflets. There was moderate stenosis. Valve   area (VTI): 1.59 cm^2. Valve area (Vmax): 1.61 cm^2. Valve area   (Vmean): 1.34 cm^2. - Mitral valve: There was mild regurgitation. - Left atrium: The atrium was moderately dilated. - Right atrium: The atrium was mildly dilated. - Pulmonary arteries: Systolic pressure was mildly increased. PA   peak pressure: 34 mm Hg (S).   Neuro/Psych negative neurological ROS  negative psych ROS   GI/Hepatic negative GI ROS, Neg liver ROS,   Endo/Other  negative endocrine ROS  Renal/GU negative Renal ROS  negative genitourinary   Musculoskeletal negative musculoskeletal ROS (+)   Abdominal   Peds negative pediatric ROS (+)  Hematology negative hematology ROS (+)   Anesthesia Other Findings   Reproductive/Obstetrics negative OB ROS                            Anesthesia Physical Anesthesia Plan  ASA: III  Anesthesia Plan: MAC   Post-op Pain Management:    Induction: Intravenous  PONV Risk Score and  Plan: 2 and Ondansetron and Propofol infusion  Airway Management Planned: Natural Airway and Simple Face Mask  Additional Equipment:   Intra-op Plan:   Post-operative Plan:   Informed Consent: I have reviewed the patients History and Physical, chart, labs and discussed the procedure including the risks, benefits and alternatives for the proposed anesthesia with the patient or authorized representative who has indicated his/her understanding and acceptance.   Dental advisory given  Plan Discussed with: Anesthesiologist and CRNA  Anesthesia Plan Comments:        Anesthesia Quick Evaluation

## 2017-11-08 NOTE — Anesthesia Procedure Notes (Signed)
Procedure Name: MAC Date/Time: 11/08/2017 9:09 AM Performed by: Mariea Clonts, CRNA Pre-anesthesia Checklist: Patient identified, Emergency Drugs available, Suction available, Patient being monitored and Timeout performed Patient Re-evaluated:Patient Re-evaluated prior to induction Oxygen Delivery Method: Nasal cannula

## 2017-11-08 NOTE — Progress Notes (Addendum)
Progress Note  Patient Name: Timothy LARRABEE Sr. Date of Encounter: 11/08/2017  Primary Cardiologist: Rozann Lesches, MD Primary Electrophysiologist: [name], MD  Subjective   No significant overnight events. Patient denies any chest pain, shortness of breath, palpitations, lightheadedness, or dizziness. Patient states he would like to go home.   Inpatient Medications    Scheduled Meds: . atorvastatin  10 mg Oral q1800  . cholecalciferol  1,000 Units Oral Daily  . metoprolol succinate  75 mg Oral BID  . omega-3 acid ethyl esters  1,000 mg Oral Daily  . pantoprazole  40 mg Oral BID  . tamsulosin  0.4 mg Oral QHS  . vitamin C  500 mg Oral Daily   Continuous Infusions: . sodium chloride Stopped (11/07/17 2055)  . heparin 1,300 Units/hr (11/08/17 1036)   PRN Meds: sodium chloride, acetaminophen, meclizine, senna-docusate, traMADol   Vital Signs    Vitals:   11/08/17 0955 11/08/17 1005 11/08/17 1039 11/08/17 1158  BP: 109/74 125/68 117/76 (!) 115/93  Pulse: 84  82 86  Resp: 16     Temp:   98.1 F (36.7 C) 97.6 F (36.4 C)  TempSrc:   Oral Oral  SpO2: 100%  100% 100%  Weight:      Height:        Intake/Output Summary (Last 24 hours) at 11/08/2017 1336 Last data filed at 11/08/2017 0932 Gross per 24 hour  Intake 610.21 ml  Output -  Net 610.21 ml   Filed Weights   11/06/17 0400 11/07/17 0448 11/08/17 0700  Weight: 88.4 kg 87.9 kg 88.5 kg    Telemetry    Atrial fibrillation with RVR, rate ranging between 90s bpm to as high as 140s bpm - Personally Reviewed  Physical Exam   GEN: 77 year old overweight male resting comfortably in hospital bed in no acute distress.   Neck: Supple. Cardiac: RRR. No murmurs, rubs, or gallops.  Respiratory: Clear to auscultation bilaterally. GI: Abdomen soft, mildly distended, and non-tender to palpation. Bowel sounds present. MS: No edema; No deformity. Neuro:  No focal deficits.  Psych: Normal affect   Labs      Chemistry Recent Labs  Lab 11/05/17 1518 11/06/17 0318 11/07/17 0517 11/08/17 0427  NA 140 143 140 139  K 4.1 3.7 3.7 4.1  CL 110 111 108 107  CO2 21* 25 25 25   GLUCOSE 111* 109* 116* 99  BUN 47* 37* 22 15  CREATININE 1.20 1.17 1.08 1.02  CALCIUM 7.7* 7.9* 8.2* 8.2*  PROT 5.1*  --  5.6* 5.6*  ALBUMIN 2.6*  --  2.9* 2.9*  AST 94*  --  137* 107*  ALT 51*  --  79* 69*  ALKPHOS 61  --  67 67  BILITOT 0.9  --  1.0 1.2  GFRNONAA 57* 58* >60 >60  GFRAA >60 >60 >60 >60  ANIONGAP 9 7 7 7      Hematology Recent Labs  Lab 11/06/17 1700 11/07/17 0517 11/08/17 0427  WBC 6.2 5.9 8.0  RBC 2.88* 2.87* 2.86*  HGB 8.5* 8.6* 8.5*  HCT 26.7* 26.8* 26.6*  MCV 92.7 93.4 93.0  MCH 29.5 30.0 29.7  MCHC 31.8 32.1 32.0  RDW 13.7 13.6 13.5  PLT 141* 158 216    Cardiac EnzymesNo results for input(s): TROPONINI in the last 168 hours. No results for input(s): TROPIPOC in the last 168 hours.   BNPNo results for input(s): BNP, PROBNP in the last 168 hours.   DDimer No results for  input(s): DDIMER in the last 168 hours.   Radiology    No results found.  Cardiac Studies   ECHO 11/06/2017: Study Conclusions: - Left ventricle: The cavity size was normal. Systolic function was mildly to moderately reduced. The estimated ejection fraction was in the range of 40% to 45%. - Aortic valve: Mildly calcified annulus. Moderately thickened, moderately calcified leaflets. There was moderate stenosis. Valve area (VTI): 1.59 cm^2. Valve area (Vmax): 1.61 cm^2. Valve area (Vmean): 1.34 cm^2. - Mitral valve: There was mild regurgitation. - Left atrium: The atrium was moderately dilated. - Right atrium: The atrium was mildly dilated. - Pulmonary arteries: Systolic pressure was mildly increased. PA peak pressure: 34 mm Hg (S).  Patient Profile     Timothy Fritzis a 77 y.o.malewith a hx of aortic aneurysm status post Bentall procedure in 2002 with mechanical AVR, coronary  artery disease nonobstructive in 2002, hypertension, hyperlipidemia, nonischemic cardiomyopathy status post [employer] Jude CRT-D in 2014, and persistent atrial fibrillation on Coumadinwho is being seen today for the evaluation ofICD shocksat the request of [employer].  Assessment & Plan    1.Atrial Fibrillation with RVR  - Patient presented to [facility] ED after experiencing multiple ICD shocks and was found to be in wide-complex tachycardia presumed to be VT. Patient was also found to have hematochezia with a hemoglobin of 11.7 and INR of 9.7. Patient was hemodynamically stable and transferred to [facility] for Cardiology and GI consultation.  - With review of device interrogation, it appears shocks were likely due to rapid atrial fibrillation not VT. This is likely due to acute GI bleed with profound anemia.  - Telemetry shows patient is back in atrial fibrillation with RVR with rate in the 90s to 100bpms. Heart rate did spike into the 140s earlier this morning.  - Will give IV Metoprolol now. - Will increase Toprol XL from 75mg  to 100mg  twice daily.  - INR 1.6 this morning. - Patient is currently on Heparin. Okay to transition to Coumadin per GI. - Discussed with Dr. Loleta Books and Dr. Ellyn Hack about bridging patient. Would recommend continuing to bridge patient with Heparin today to ensure that INR does not continue to drop. INR goal is 2.5 to 3.5 due to mechanical valve. Okay to transition to Lovenox tomorrow if INR trending up appropriately.   2. Multiple ICD shocks for AF with RVR - ICD interrogation reviewed at length yesterday by Dr. Rayann Heman & VT zone adjusted from 180 bpm to a single VF zone of 222 bpm to minimize shocks. He has never had appropriate ICD therapy prior. This should reduce the opportunity for shocks for atrial fibrillation in the future.  3. GI Bleed - Hgb stable today at 8.5 this morning.Provided hemoglobin level stays stable, plan is to avoid transfusion unless  Hgb drops below 8.  - INR 1.6 this morning.  - EGD/colonoscopy performed today.  - Currently on Heparin. Okay to transition to Coumadin per GI; however, patient is at increased risk of bleeding due to multiple large ulcers noted in duodenal bulb.  4. Chronic Systolic Dysfunction / Nonischemic Cardiomyopathy s/p ICD - Echo 11/06/2017 showed left ventricle with mildly to moderately reduced systolic function with EF of 40-45%; moderately dilated left atrium; mildly dilated right atrium; moderate aortic stenosis; mild mitral regurgitation; PASP mildly increased at 34 mmHg. - No evidence of volume overload on exam.  - Increase Toprol XL at above.   - Will continue to hold Losartan at this time given potential hypovolemia  in setting of GI bleed. BP 121/65 this morning. - Patient only takes Lasix as need in outpatient setting. No indication for restarting diuretic at this time.   5. Mechanic AVR - Patient currently on Heparin. Okay to transition to Coumadin per GI. - Will continue to bridge with Heparin today. May transition to Lovenox tomorrow if INR is trending up appropriately.       Signed, Darreld Mclean, PA-C  11/08/2017, 1:36 PM    I have seen, examined and evaluated the patient this PM along with Darreld Mclean, PA-C .  After reviewing all the available data and chart, we discussed the patients laboratory, study & physical findings as well as symptoms in detail. I agree with her findings, examination as well as impression recommendations as per our discussion.    Timothy Fritz just had his GI procedure done.  Has been cleared to restart warfarin.  The hope is that he may be able to transition to Lovenox for bridging until therapeutic INR.  Unfortunately he did receive vitamin K which would probably prolong how long it takes for him to get back therapeutic.  (This may mean the prolonged timeframe of Lovenox.  I think is a little bit early and premature to start that today, but would not be  unreasonable to start Lovenox treatment and training)  Unfortunately, he is now back in A. fib going rates anywhere from 80-120 beats a minute.  Faster when he is walking.  Would like to see his heart rate improved prior to discharge.  We will give a one-time 5 mg Lopressor and then increase standing dose 100 mg Toprol.  Follow heart rate overnight.  Blood pressure does not allow for calcium channel blocker, may need to consider digoxin or other treatment.  Will follow.    Glenetta Hew, M.D., M.S. Interventional Cardiologist   Pager # [phone] Phone # [phone] [address] [place], [place] [address]     For questions or updates, please contact [place] Please consult [email or web address] for contact info under

## 2017-11-08 NOTE — Brief Op Note (Signed)
11/05/2017 - 11/08/2017  9:58 AM  PATIENT:  Timothy Fowler Sr.  77 y.o. male  PRE-OPERATIVE DIAGNOSIS:  hematochezia, vomiting blood  POST-OPERATIVE DIAGNOSIS:  2 antral ulcers, 3 large duodenal ulcers, rectal polyp, transverse colon polyp, diverticulosis, hemorrhoids  PROCEDURE:  Procedure(s): ESOPHAGOGASTRODUODENOSCOPY (EGD) WITH PROPOFOL Needs INR<2 (N/A) COLONOSCOPY WITH PROPOFOL (N/A) POLYPECTOMY  SURGEON:  Surgeon(s) and Role:    Ronnette Juniper, MD - Primary  PHYSICIAN ASSISTANT:   ASSISTANTS: Burtis Junes, RN, Minette Brine, Tech  ANESTHESIA:   MAC  JAS:[id number]   BLOOD ADMINISTERED:none  DRAINS: none   LOCAL MEDICATIONS USED:  NONE  SPECIMEN:  Biopsy / Limited Resection  DISPOSITION OF SPECIMEN:  PATHOLOGY  COUNTS:  YES  TOURNIQUET:  * No tourniquets in log *  DICTATION: .Dragon Dictation  PLAN OF CARE: Admit to inpatient   PATIENT DISPOSITION:  PACU - hemodynamically stable.   Delay start of Pharmacological VTE agent (>24hrs) due to surgical blood loss or risk of bleeding: no

## 2017-11-08 NOTE — Op Note (Signed)
[facility] Patient Name: Timothy Fritz Procedure Date : 11/08/2017 MRN: [id number] Attending MD: Ronnette Juniper , MD Date of Birth: 12/08/1940 CSN: [id number] Age: 77 Admit Type: Inpatient Procedure:                Upper GI endoscopy Indications:              Hematemesis Providers:                Ronnette Juniper, MD, Burtis Junes, RN, William Dalton,                            Technician Referring MD:              Medicines:                Monitored Anesthesia Care Complications:            No immediate complications. Estimated blood loss:                            None. Estimated Blood Loss:     Estimated blood loss: none. Procedure:                Pre-Anesthesia Assessment:                           - Prior to the procedure, a History and Physical                            was performed, and patient medications and                            allergies were reviewed. The patient's tolerance of                            previous anesthesia was also reviewed. The risks                            and benefits of the procedure and the sedation                            options and risks were discussed with the patient.                            All questions were answered, and informed consent                            was obtained. Prior Anticoagulants: The patient has                            taken Coumadin (warfarin), last dose was 7 days                            prior to procedure. ASA Grade Assessment: III - A                            patient with  severe systemic disease. After                            reviewing the risks and benefits, the patient was                            deemed in satisfactory condition to undergo the                            procedure.                           After obtaining informed consent, the endoscope was                            passed under direct vision. Throughout the                            procedure, the patient's  blood pressure, pulse, and                            oxygen saturations were monitored continuously. The                            GIF-H190 ([id number]) Olympus Adult EGD was introduced                            through the mouth, and advanced to the second part                            of duodenum. The upper GI endoscopy was                            accomplished without difficulty. The patient                            tolerated the procedure well. Scope In: Scope Out: Findings:      The examined esophagus was normal.      The Z-line was regular and was found 40 cm from the incisors.      Three non-bleeding slightly cratered but mostly superficial gastric       ulcers with a clean ulcer base (Forrest Class III) were found in the       gastric antrum. The largest lesion was 8 mm in largest dimension.      The cardia and gastric fundus were normal on retroflexion.      Three large non-bleeding superficial duodenal ulcers with pigmented       material were found in the duodenal bulb and in the first portion of the       duodenum. The largest lesion was twenty mm by thirty mm in largest       dimension. Impression:               - Normal esophagus.                           -  Z-line regular, 40 cm from the incisors.                           - Non-bleeding gastric ulcers with a clean ulcer                            base (Forrest Class III).                           - Multiple non-bleeding duodenal ulcers with                            pigmented material.                           - No specimens collected. Moderate Sedation:      Patient did not receive moderate sedation for this procedure, but       instead received monitored anesthesia care. Recommendation:           - Resume regular diet.                           - Use Protonix (pantoprazole) 40 mg PO BID for 2                            months.                           - Then, PPI indefinitely while patient is on                             Coumadin.                           - H pylori stool AG and treat if found. Procedure Code(s):        --- Professional ---                           [phone], Esophagogastroduodenoscopy, flexible,                            transoral; diagnostic, including collection of                            specimen(s) by brushing or washing, when performed                            (separate procedure) Diagnosis Code(s):        --- Professional ---                           K25.9, Gastric ulcer, unspecified as acute or                            chronic, without hemorrhage or perforation  K26.9, Duodenal ulcer, unspecified as acute or                            chronic, without hemorrhage or perforation                           K92.0, Hematemesis CPT copyright 2017 American Medical Association. All rights reserved. The codes documented in this report are preliminary and upon coder review may  be revised to meet current compliance requirements. Ronnette Juniper, MD 11/08/2017 9:55:18 AM This report has been signed electronically. Number of Addenda: 0

## 2017-11-08 NOTE — Progress Notes (Signed)
[place] for Heparin, warfarin Indication: Mechanical AVR + Afib  No Known Allergies  Patient Measurements: Height: 5\' 8"  (172.7 cm) Weight: 195 lb 3.2 oz (88.5 kg) IBW/kg (Calculated) : 68.4 Heparin Dosing Weight: 86.4 kg  Vital Signs: Temp: 97.6 F (36.4 C) (09/25 1158) Temp Source: Oral (09/25 1158) BP: 115/93 (09/25 1158) Pulse Rate: 86 (09/25 1158)  Labs: Recent Labs    11/06/17 0318  11/06/17 1700 11/07/17 0517 11/08/17 0427 11/08/17 1849  HGB 8.4*   < > 8.5* 8.6* 8.5*  --   HCT 26.0*   < > 26.7* 26.8* 26.6*  --   PLT 116*   < > 141* 158 216  --   LABPROT 36.4*  --   --  35.7* 18.9*  --   INR 3.70  --   --  3.61 1.60  --   HEPARINUNFRC  --   --   --   --   --  0.33  CREATININE 1.17  --   --  1.08 1.02  --    < > = values in this interval not displayed.    Estimated Creatinine Clearance: 65.5 mL/min (by C-G formula based on SCr of 1.02 mg/dL).   Medical History: Past Medical History:  Diagnosis Date  . AICD (automatic cardioverter/defibrillator) present 07/11/2012  . Aortic aneurysm, thoracic ([place])    Fusiform s/p Bentall procedure 2002  . Aortic valve, bicuspid    Mechanical AVR  . Basal cell carcinoma of face    "burned off" (11/06/2017)  . BPH (benign prostatic hypertrophy)   . CHF (congestive heart failure) ([place])    "once" (11/06/2017)  . Coronary atherosclerosis of native coronary artery    Nonobstructive at cardiac catherization 2002  . Essential hypertension, benign   . Left bundle branch block   . Mixed hyperlipidemia   . Nonischemic cardiomyopathy (H[facility])    CRT-D  . Persistent atrial fibrillation ([place])   . Presence of permanent cardiac pacemaker     Assessment: 71 YOF who presented on 9/22 with ICD shocks and concern for lower GIB. The patient was on warfarin PTA for hx mech AVR + Afib. Admit INR elevated at 8.35 in the setting of recent outpatient Bactrim on PTA dosing of 5 mg daily.   Patient now  s/p EGD and colonoscopy 9/25. Two polyps removed scattered diverticula noted in colon. PPI to continue, heparin and warfarin restarted post procedure -heparin level= 0.33   Goal of Therapy:  Heparin goal 0.3-0.7 INR goal 2.5-3.5 Monitor platelets by anticoagulation protocol: Yes   Plan: -No heparin changes needed -Daily heparin level and CBC  Hildred Laser, PharmD Clinical Pharmacist Please check Amion for pharmacy contact number

## 2017-11-08 NOTE — Interval H&P Note (Signed)
History and Physical Interval Note: 77/amle with one episode of hematemesis and several episodes of hematochezia for EGD and colonoscopy today.  11/08/2017 8:50 AM  Timothy Fritz Sr.  has presented today for EGD and colonoscopy, with the diagnosis of hematochezia, vomiting blood  The various methods of treatment have been discussed with the patient and family. After consideration of risks, benefits and other options for treatment, the patient has consented to  Procedure(s): ESOPHAGOGASTRODUODENOSCOPY (EGD) WITH PROPOFOL Needs INR<2 (N/A) COLONOSCOPY WITH PROPOFOL (N/A) as a surgical intervention .  The patient's history has been reviewed, patient examined, no change in status, stable for surgery.  I have reviewed the patient's chart and labs.  Questions were answered to the patient's satisfaction.     Ronnette Juniper

## 2017-11-08 NOTE — Op Note (Signed)
[facility] Patient Name: Timothy Fritz Procedure Date : 11/08/2017 MRN: [id number] Attending MD: Ronnette Juniper , MD Date of Birth: Mar 09, 1940 CSN: [id number] Age: 77 Admit Type: Inpatient Procedure:                Colonoscopy Indications:              Last colonoscopy: October 2018, Hematochezia Providers:                Ronnette Juniper, MD, Burtis Junes, RN, William Dalton,                            Technician Referring MD:              Medicines:                Monitored Anesthesia Care Complications:            No immediate complications. Estimated blood loss:                            None. Estimated Blood Loss:     Estimated blood loss: none. Procedure:                Pre-Anesthesia Assessment:                           - Prior to the procedure, a History and Physical                            was performed, and patient medications and                            allergies were reviewed. The patient's tolerance of                            previous anesthesia was also reviewed. The risks                            and benefits of the procedure and the sedation                            options and risks were discussed with the patient.                            All questions were answered, and informed consent                            was obtained. Prior Anticoagulants: The patient has                            taken Coumadin (warfarin), last dose was 7 days                            prior to procedure. ASA Grade Assessment: III - A  patient with severe systemic disease. After                            reviewing the risks and benefits, the patient was                            deemed in satisfactory condition to undergo the                            procedure.                           - Prior to the procedure, a History and Physical                            was performed, and patient medications and   allergies were reviewed. The patient's tolerance of                            previous anesthesia was also reviewed. The risks                            and benefits of the procedure and the sedation                            options and risks were discussed with the patient.                            All questions were answered, and informed consent                            was obtained. Prior Anticoagulants: The patient has                            taken Coumadin (warfarin), last dose was 6 days                            prior to procedure. ASA Grade Assessment: III - A                            patient with severe systemic disease. After                            reviewing the risks and benefits, the patient was                            deemed in satisfactory condition to undergo the                            procedure.                           After obtaining informed consent, the colonoscope  was passed under direct vision. Throughout the                            procedure, the patient's blood pressure, pulse, and                            oxygen saturations were monitored continuously. The                            PCF-[id number] ([id number]) peds colon was introduced                            through the anus and advanced to the the cecum,                            identified by appendiceal orifice and ileocecal                            valve. The colonoscopy was performed without                            difficulty. The patient tolerated the procedure                            well. The quality of the bowel preparation was good. Scope In: 9:21:08 AM Scope Out: 9:40:58 AM Scope Withdrawal Time: 0 hours 12 minutes 50 seconds  Total Procedure Duration: 0 hours 19 minutes 50 seconds  Findings:      The perianal and digital rectal examinations were normal.      A 6 mm polyp was found in the transverse colon. The polyp was sessile.       The  polyp was removed with a hot snare. Resection and retrieval were       complete.      A 9 mm polyp was found in the rectum. The polyp was sessile. The polyp       was removed with a hot snare. Resection and retrieval were complete.      Scattered small and large-mouthed diverticula were found in the sigmoid       colon and descending colon.      The exam was otherwise without abnormality on direct and retroflexion       views. Impression:               - One 6 mm polyp in the transverse colon, removed                            with a hot snare. Resected and retrieved.                           - One 9 mm polyp in the rectum, removed with a hot                            snare. Resected and retrieved.                           -  Diverticulosis in the sigmoid colon and in the                            descending colon.                           - The examination was otherwise normal on direct                            and retroflexion views. Recommendation:           - Resume regular diet.                           - Continue present medications.                           - Await pathology results.                           - Repeat colonoscopy for surveillance based on                            pathology results. Procedure Code(s):        --- Professional ---                           [phone], Colonoscopy, flexible; with removal of                            tumor(s), polyp(s), or other lesion(s) by snare                            technique Diagnosis Code(s):        --- Professional ---                           D12.3, Benign neoplasm of transverse colon (hepatic                            flexure or splenic flexure)                           K62.1, Rectal polyp                           K92.1, Melena (includes Hematochezia)                           K57.30, Diverticulosis of large intestine without                            perforation or abscess without bleeding CPT copyright  2017 American Medical Association. All rights reserved. The codes documented in this report are preliminary and upon coder review may  be revised to meet current compliance requirements. Ronnette Juniper, MD 11/08/2017 9:57:49 AM This report has been signed electronically. Number of Addenda: 0

## 2017-11-08 NOTE — Progress Notes (Addendum)
PROGRESS NOTE    Timothy WINKELS Sr.  GMW:[id number] DOB: November 05, 1940 DOA: 11/05/2017 PCP: Timothy Blitz, MD      Brief Narrative:  Timothy Fritz is a 77 y.o. M with NICM with ICD, EF 40%, mech AVR and AF on warfarin, and HTN who presented with several days melena, then abdominal pain and hematemesis.        Assessment & Plan:  Upper GI bleed Endoscopies today showed several gastric and duodenal ulcers.   -Continue pantoprazole, convert to oral BID -Will need [facility] follow up -Follow up H pylori -2 months BID PPI then indefinitely daily   Atrial fibrillation with RVR, paroxysmal CHA2DS2-Vasc 4 for age, HTN, CHF.  Complicated by AVR. -Continue heparin for today -Will discuss bridging with Cardiology; if will need bridging, will use Lovenox and have close outpatient INR follow up  Acute blood loss anemia Stable Hgb today.    -Transfusion threshold 8 g/dL -Start iron  Multiple ICD shocks  Nonischemic cardiomyopathy Mechanical aortic valve Goal INR 2.5-3.5 given EF and AF (JACC 2017).  EF 40-45% -Continue atorvastatin -Continue metoprolol  Recent umbilical hernia repair  Other medications -Continue iron  Transaminitis Asymptomatic.  Unclear chronicity.  Patient denies history of abnormal liver tests.  -Follow up with PCP          DVT prophylaxis: N/A on heparin Code Status: FULL Family Communication: None present MDM and disposition Plan: The below labs and imaging reports were reviewed and summarized above.  Medication management as above, including anticoagulant management.  The patient was admitted with upper gi bleed and >4 g/dL drop in hemoglobin as well as atrial fibrillation with RVR.  He has been stabilized and endoscopy completed  We will titrated metoprolol to improve hemodynamic instability (HRs this morning >125), likely discharge tomorrow tiwth Lovenox bridge.   Consultants:   Cardiology  Gastroenterology  Procedures:   Echocardiogram  9/23 LV EF: 40% -   45%  ------------------------------------------------------------------- History:   PMH:  Chronic systolic heart failure.  Atrial fibrillation.  Risk factors:  ICD in place. Mechanical aortic valve replacement. Hypertension.  ------------------------------------------------------------------- Study Conclusions  - Left ventricle: The cavity size was normal. Systolic function was   mildly to moderately reduced. The estimated ejection fraction was   in the range of 40% to 45%. - Aortic valve: Mildly calcified annulus. Moderately thickened,   moderately calcified leaflets. There was moderate stenosis. Valve   area (VTI): 1.59 cm^2. Valve area (Vmax): 1.61 cm^2. Valve area   (Vmean): 1.34 cm^2. - Mitral valve: There was mild regurgitation. - Left atrium: The atrium was moderately dilated. - Right atrium: The atrium was mildly dilated. - Pulmonary arteries: Systolic pressure was mildly increased. PA   peak pressure: 34 mm Hg (S).   EGD 9/25  Colonoscopy 9/25 EGD was performed for hematemesis.  Findings: Normal esophagus, regular Z line at 40 cm from incisors. 3 nonbleeding slightly cratered but mostly superficial gastric ulcers in the antrum, largest 8 mm in dimension. Cardiac fundus normal on retroflexion. 3 large nonbleeding superficial duodenal ulcers with pigmented material found in the duodenal bulb and first portion of duodenum. Largest ulcer was 2 cm 3 cm in size. Not amenable for endoscopic treatment due to their is large size, not bleeding at present.  Colonoscopy was performed for hematochezia. Findings: A 6 mm polyp noted to transverse colon, removed via hot snare polypectomy. A 9 mm polyp noted in rectum, removed via hot snare polypectomy. Several scattered small and large mouth diverticula noted  in sigmoid and descending colon. The rest of the colon and retroflexion was unremarkable   Recommendations: PPI twice a day for 2 months, then  indefinitely while patient is on Coumadin. H pylori stool antigen and treat if positive. Okay to resume regular diet. Patient to be started on heparin drip today, okay to transition to Coumadin, however remains at increased risk of bleeding due to multiple large ulcers noted in duodenal bulb.     Antimicrobials:   None    Subjective: No more melena.  No more hematemesis. No abdominal pain.  No confusion.  Weak, but improving.  No chest pain, palpitations, dyspnea, leg swelling, orthopnea.  Objective: Vitals:   11/08/17 0946 11/08/17 0955 11/08/17 1005 11/08/17 1039  BP: 90/63 109/74 125/68 117/76  Pulse: 88 84  82  Resp: 15 16    Temp: (!) 97.5 F (36.4 C)   98.1 F (36.7 C)  TempSrc: Oral   Oral  SpO2: 100% 100%  100%  Weight:      Height:        Intake/Output Summary (Last 24 hours) at 11/08/2017 1121 Last data filed at 11/08/2017 7948 Gross per 24 hour  Intake 1088.21 ml  Output -  Net 1088.21 ml   Filed Weights   11/06/17 0400 11/07/17 0448 11/08/17 0700  Weight: 88.4 kg 87.9 kg 88.5 kg    Examination: General appearance:  adult male, alert and in no acute distress.  Sitting in chair HEENT: Anicteric, conjunctiva pink, lids and lashes normal. No nasal deformity, discharge, epistaxis.  Lips moist, OP dry no oral lesions, hearing normal.   Skin: Warm and dry.  no jaundice.  No suspicious rashes or lesions. Cardiac: Tachycardic, irregular, nl S1-S2, no murmurs appreciated.  Capillary refill is brisk.  JVP not visible.  No LE edema.  Radia  pulses 2+ and symmetric. Respiratory: Normal respiratory rate and rhythm.  CTAB without rales or wheezes. Abdomen: Abdomen soft.  No TTP. No ascites, distension, hepatosplenomegaly.   MSK: No deformities or effusions. Neuro: Awake and alert.  EOMI, moves all extremities. Speech fluent.    Psych: Sensorium intact and responding to questions, attention normal. Affect blnted.  Judgment and insight appear normal.    Data Reviewed:  I have personally reviewed following labs and imaging studies:  CBC: Recent Labs  Lab 11/05/17 1518  11/06/17 0318 11/06/17 0926 11/06/17 1700 11/07/17 0517 11/08/17 0427  WBC 7.4   < > 6.0 6.5 6.2 5.9 8.0  NEUTROABS 5.2  --   --   --   --   --   --   HGB 9.5*   < > 8.4* 9.5* 8.5* 8.6* 8.5*  HCT 29.6*   < > 26.0* 29.3* 26.7* 26.8* 26.6*  MCV 91.4   < > 91.5 92.1 92.7 93.4 93.0  PLT 101*   < > 116* 146* 141* 158 216   < > = values in this interval not displayed.   Basic Metabolic Panel: Recent Labs  Lab 11/05/17 1518 11/06/17 0318 11/07/17 0517 11/08/17 0427  NA 140 143 140 139  K 4.1 3.7 3.7 4.1  CL 110 111 108 107  CO2 21* 25 25 25   GLUCOSE 111* 109* 116* 99  BUN 47* 37* 22 15  CREATININE 1.20 1.17 1.08 1.02  CALCIUM 7.7* 7.9* 8.2* 8.2*  MG 2.0  --   --   --   PHOS 1.7*  --  3.1  --    GFR: Estimated Creatinine Clearance: 65.5 mL/min (by C-G  formula based on SCr of 1.02 mg/dL). Liver Function Tests: Recent Labs  Lab 11/05/17 1518 11/07/17 0517 11/08/17 0427  AST 94* 137* 107*  ALT 51* 79* 69*  ALKPHOS 61 67 67  BILITOT 0.9 1.0 1.2  PROT 5.1* 5.6* 5.6*  ALBUMIN 2.6* 2.9* 2.9*   No results for input(s): LIPASE, AMYLASE in the last 168 hours. No results for input(s): AMMONIA in the last 168 hours. Coagulation Profile: Recent Labs  Lab 11/05/17 1518 11/06/17 0318 11/07/17 0517 11/08/17 0427  INR 8.35* 3.70 3.61 1.60   Cardiac Enzymes: No results for input(s): CKTOTAL, CKMB, CKMBINDEX, TROPONINI in the last 168 hours. BNP (last 3 results) No results for input(s): PROBNP in the last 8760 hours. HbA1C: No results for input(s): HGBA1C in the last 72 hours. CBG: No results for input(s): GLUCAP in the last 168 hours. Lipid Profile: No results for input(s): CHOL, HDL, LDLCALC, TRIG, CHOLHDL, LDLDIRECT in the last 72 hours. Thyroid Function Tests: No results for input(s): TSH, T4TOTAL, FREET4, T3FREE, THYROIDAB in the last 72 hours. Anemia Panel: No  results for input(s): VITAMINB12, FOLATE, FERRITIN, TIBC, IRON, RETICCTPCT in the last 72 hours. Urine analysis:    Component Value Date/Time   COLORURINE AMBER (A) 04/17/2012 1751   APPEARANCEUR CLOUDY (A) 04/17/2012 1751   LABSPEC 1.027 04/17/2012 1751   PHURINE 6.0 04/17/2012 1751   GLUCOSEU NEGATIVE 04/17/2012 1751   HGBUR NEGATIVE 04/17/2012 1751   BILIRUBINUR SMALL (A) 04/17/2012 1751   KETONESUR 15 (A) 04/17/2012 1751   PROTEINUR 30 (A) 04/17/2012 1751   UROBILINOGEN 1.0 04/17/2012 1751   NITRITE NEGATIVE 04/17/2012 1751   LEUKOCYTESUR LARGE (A) 04/17/2012 1751   Sepsis Labs: @LABRCNTIP (procalcitonin:4,lacticacidven:4)  ) Recent Results (from the past 240 hour(s))  MRSA PCR Screening     Status: None   Collection Time: 11/05/17  2:08 PM  Result Value Ref Range Status   MRSA by PCR NEGATIVE NEGATIVE Final    Comment:        The GeneXpert MRSA Assay (FDA approved for NASAL specimens only), is one component of a comprehensive MRSA colonization surveillance program. It is not intended to diagnose MRSA infection nor to guide or monitor treatment for MRSA infections. Performed at [facility], [place] [address], [place], [place] [address]          Radiology Studies: No results found.      Scheduled Meds: . atorvastatin  10 mg Oral q1800  . cholecalciferol  1,000 Units Oral Daily  . metoprolol succinate  75 mg Oral BID  . omega-3 acid ethyl esters  1,000 mg Oral Daily  . pantoprazole (PROTONIX) IV  40 mg Intravenous Q12H  . tamsulosin  0.4 mg Oral QHS  . vitamin C  500 mg Oral Daily   Continuous Infusions: . sodium chloride Stopped (11/07/17 2055)  . heparin 1,300 Units/hr (11/08/17 1036)     LOS: 3 days    Time spent: 35 minutes    Edwin Dada, MD [facility] 11/08/2017, 11:21 AM     Pager [phone] --- please page though AMION:  [email or web address] Password [email or web address] If 7PM-7AM, please contact night-coverage

## 2017-11-09 ENCOUNTER — Encounter (HOSPITAL_COMMUNITY): Payer: Self-pay | Admitting: Gastroenterology

## 2017-11-09 LAB — PROTIME-INR
INR: 1.29
Prothrombin Time: 16 seconds — ABNORMAL HIGH (ref 11.4–15.2)

## 2017-11-09 LAB — BASIC METABOLIC PANEL
Anion gap: 5 (ref 5–15)
BUN: 12 mg/dL (ref 8–23)
CALCIUM: 8.2 mg/dL — AB (ref 8.9–10.3)
CO2: 24 mmol/L (ref 22–32)
CREATININE: 1.03 mg/dL (ref 0.61–1.24)
Chloride: 109 mmol/L (ref 98–111)
GFR calc non Af Amer: >60 mL/min (ref >60)
Glucose, Bld: 119 mg/dL — ABNORMAL HIGH (ref 70–99)
Potassium: 4 mmol/L (ref 3.5–5.1)
Sodium: 138 mmol/L (ref 135–145)

## 2017-11-09 LAB — CBC
HEMATOCRIT: 26.5 % — AB (ref 39.0–52.0)
HEMOGLOBIN: 8.4 g/dL — AB (ref 13.0–17.0)
MCH: 29.8 pg (ref 26.0–34.0)
MCHC: 31.7 g/dL (ref 30.0–36.0)
MCV: 94 fL (ref 78.0–100.0)
Platelets: 266 10*3/uL (ref 150–400)
RBC: 2.82 MIL/uL — AB (ref 4.22–5.81)
RDW: 13.6 % (ref 11.5–15.5)
WBC: 10.8 10*3/uL — AB (ref 4.0–10.5)

## 2017-11-09 LAB — HEPARIN LEVEL (UNFRACTIONATED): HEPARIN UNFRACTIONATED: 0.44 [IU]/mL (ref 0.30–0.70)

## 2017-11-09 MED ORDER — WARFARIN SODIUM 7.5 MG PO TABS: 7.5000 mg | ORAL_TABLET | Freq: Once | ORAL | Status: DC

## 2017-11-09 MED ORDER — ENOXAPARIN (LOVENOX) PATIENT EDUCATION KIT
PACK | Freq: Once | Status: AC
  Administered 2017-11-09: 17:00:00
  Filled 2017-11-09: qty 1

## 2017-11-09 MED ORDER — WARFARIN SODIUM 10 MG PO TABS
10.0000 mg | ORAL_TABLET | Freq: Once | ORAL | Status: AC
  Administered 2017-11-09: 10 mg via ORAL
  Filled 2017-11-09: qty 1

## 2017-11-09 NOTE — Care Management (Signed)
11-09-17   BENEFITS CHECK:  # 3.  S/W  [facility]  @ [place] RX # [phone]   1. LOVENOX  80 MG SQ 12 COVER- NOT COVER PRIOR APPROVAL-YES # [phone]  2. ENOXAPARIN 80 MG SQ 12  [place] COVER- YES CO-PAY- $ 95.00   CAN ONLY PURCHASE  30 DAY SUPPLY AT RETAIL  TIER- 4 DRUG PRIOR APPROVAL- NO  NO DEDUCTIBLE  PREFERRED PHARMACY : YES [facility], [facility] AND [facility]

## 2017-11-09 NOTE — Plan of Care (Signed)
  Problem: Education: Goal: Knowledge of General Education information will improve Description Including pain rating scale, medication(s)/side effects and non-pharmacologic comfort measures Outcome: Progressing   Problem: Health Behavior/Discharge Planning: Goal: Ability to manage health-related needs will improve Outcome: Progressing   Problem: Clinical Measurements: Goal: Ability to maintain clinical measurements within normal limits will improve Outcome: Progressing Goal: Will remain free from infection Outcome: Progressing Goal: Diagnostic test results will improve Outcome: Progressing Goal: Cardiovascular complication will be avoided Outcome: Progressing   Problem: Activity: Goal: Risk for activity intolerance will decrease Outcome: Progressing   Problem: Nutrition: Goal: Adequate nutrition will be maintained Outcome: Progressing   Problem: Coping: Goal: Level of anxiety will decrease Outcome: Progressing   Problem: Elimination: Goal: Will not experience complications related to bowel motility Outcome: Progressing   Problem: Pain Managment: Goal: General experience of comfort will improve Outcome: Progressing   Problem: Safety: Goal: Ability to remain free from injury will improve Outcome: Progressing   Problem: Education: Goal: Ability to identify signs and symptoms of gastrointestinal bleeding will improve Outcome: Progressing   Problem: Bowel/Gastric: Goal: Will show no signs and symptoms of gastrointestinal bleeding Outcome: Progressing   Problem: Fluid Volume: Goal: Will show no signs and symptoms of excessive bleeding Outcome: Progressing   Problem: Clinical Measurements: Goal: Complications related to the disease process, condition or treatment will be avoided or minimized Outcome: Progressing

## 2017-11-09 NOTE — Care Management Note (Signed)
Case Management Note  Patient Details  Name: Timothy BARSTOW Sr. MRN: [id number] Date of Birth: 1940/12/10  Subjective/Objective:  Pt presented for Atrial Fib- Upper Gi Bleed post EGD. Benefits check completed for Lovenox. Prior Authorization needed for Lovenix Brand:                 Action/Plan: CM will continue to monitor for additional needs.   Expected Discharge Date:                  Expected Discharge Plan:  Home/Self Care  In-House Referral:  NA  Discharge planning Services  CM Consult, Medication Assistance  Post Acute Care Choice:    Choice offered to:     DME Arranged:    DME Agency:     HH Arranged:    HH Agency:     Status of Service:  In process, will continue to follow  If discussed at Long Length of Stay Meetings, dates discussed:    Additional Comments:  Bethena Roys, RN 11/09/2017, 4:13 PM

## 2017-11-09 NOTE — Progress Notes (Addendum)
[place] for Heparin>>warfarin Indication: Mechanical AVR + Afib  No Known Allergies  Patient Measurements: Height: 5\' 8"  (172.7 cm) Weight: 196 lb 3.2 oz (89 kg) IBW/kg (Calculated) : 68.4 Heparin Dosing Weight: 86.4 kg  Vital Signs: Temp: 98.1 F (36.7 C) (09/26 0743) Temp Source: Oral (09/26 0743) BP: 109/72 (09/26 0743) Pulse Rate: 79 (09/26 0743)  Labs: Recent Labs    11/07/17 0517 11/08/17 0427 11/08/17 1849 11/09/17 0343  HGB 8.6* 8.5*  --  8.4*  HCT 26.8* 26.6*  --  26.5*  PLT 158 216  --  266  LABPROT 35.7* 18.9*  --  16.0*  INR 3.61 1.60  --  1.29  HEPARINUNFRC  --   --  0.33 0.44  CREATININE 1.08 1.02  --  1.03    Estimated Creatinine Clearance: 65.1 mL/min (by C-G formula based on SCr of 1.03 mg/dL).   Medical History: Past Medical History:  Diagnosis Date  . AICD (automatic cardioverter/defibrillator) present 07/11/2012  . Aortic aneurysm, thoracic ([place])    Fusiform s/p [place] procedure 2002  . Aortic valve, bicuspid    Mechanical AVR  . Basal cell carcinoma of face    "burned off" (11/06/2017)  . BPH (benign prostatic hypertrophy)   . CHF (congestive heart failure) ([place])    "once" (11/06/2017)  . Coronary atherosclerosis of native coronary artery    Nonobstructive at cardiac catherization 2002  . Essential hypertension, benign   . Left bundle branch block   . Mixed hyperlipidemia   . Nonischemic cardiomyopathy (H[facility])    CRT-D  . Persistent atrial fibrillation ([place])   . Presence of permanent cardiac pacemaker     Assessment: 64 YOF who presented on 9/22 with ICD shocks and concern for lower GIB. The patient was on warfarin PTA for hx mech AVR + Afib. Admit INR elevated at 8.35 in the setting of recent outpatient Bactrim on PTA dosing of 5 mg daily.   Patient now s/p EGD and colonoscopy 9/25. Two polyps removed scattered diverticula noted in colon. PPI to continue, heparin restarted post procedure.    Heparin level continues to be at goal this morning, cbc stable. Likely change to lovenox for discharge tomorrow if no bleeding. INR continues to trend down to 1.29, somewhat expected with vitamin k given a couple days ago. Will continue with higher dose of warfarin tonight.    Goal of Therapy:  Heparin goal 0.3-0.7 INR goal 2.5-3.5 Monitor platelets by anticoagulation protocol: Yes   Plan:  Warfarin 10mg  tonight Heparin currently at 1300/hr - continue for now   Disposition/discharge:  If home today would recommend lovenox 80mg  q12 hours - would give 7-10d supply to cover patient adequately in case INR still low on first check as outpt.   -Discussed copay information with patient ($95) patient will need PA to be able to be prescribed 2 syringes a day. D/w case management and they should be able to provide the authorization number soon.    Erin Hearing PharmD., BCPS Clinical Pharmacist 11/09/2017 9:12 AM

## 2017-11-09 NOTE — Progress Notes (Addendum)
Progress Note  Patient Name: Timothy LINDNER Sr. Date of Encounter: 11/09/2017  Primary Cardiologist: Rozann Lesches, MD   Subjective   No significant overnight events. No chest pain or shortness of breath. Occasional palpitations.   Inpatient Medications    Scheduled Meds: . atorvastatin  10 mg Oral q1800  . cholecalciferol  1,000 Units Oral Daily  . ferrous sulfate  325 mg Oral QODAY  . metoprolol succinate  100 mg Oral BID  . omega-3 acid ethyl esters  1,000 mg Oral Daily  . pantoprazole  40 mg Oral BID  . tamsulosin  0.4 mg Oral QHS  . vitamin C  500 mg Oral Daily  . Warfarin - Pharmacist Dosing Inpatient   Does not apply q1800   Continuous Infusions: . sodium chloride Stopped (11/07/17 2055)  . heparin 1,300 Units/hr (11/09/17 0700)   PRN Meds: sodium chloride, acetaminophen, meclizine, senna-docusate, traMADol   Vital Signs    Vitals:   11/08/17 2132 11/09/17 0016 11/09/17 0501 11/09/17 0743  BP: 108/70 100/62 102/65 109/72  Pulse: 80 84 82 79  Resp: (!) 21 20 20 20   Temp: 97.8 F (36.6 C) 97.7 F (36.5 C) (!) 97.5 F (36.4 C) 98.1 F (36.7 C)  TempSrc: Oral Oral Oral Oral  SpO2: 98% 98% 98% 99%  Weight:   89 kg   Height:        Intake/Output Summary (Last 24 hours) at 11/09/2017 0911 Last data filed at 11/09/2017 0700 Gross per 24 hour  Intake 1409.92 ml  Output -  Net 1409.92 ml   Filed Weights   11/07/17 0448 11/08/17 0700 11/09/17 0501  Weight: 87.9 kg 88.5 kg 89 kg    Telemetry    Sinus rhythm with AV pacing, rate ranging between 80s to as high as 140s at times this morning  - Personally Reviewed  Physical Exam   GEN: 77 year old overweight male resting comfortably in hospital bed in no acute distress.   Neck: Supple. Cardiac: RRR with occasional premature beat. No murmurs, rubs, or gallops.  Respiratory: Clear to auscultation bilaterally. GI: Abdomen soft, non-tender, non-distended  MS: No lower extremity edema. Neuro:  No focal  deficits.  Psych: Normal affect.  Labs    Chemistry Recent Labs  Lab 11/05/17 1518  11/07/17 0517 11/08/17 0427 11/09/17 0343  NA 140   < > 140 139 138  K 4.1   < > 3.7 4.1 4.0  CL 110   < > 108 107 109  CO2 21*   < > 25 25 24   GLUCOSE 111*   < > 116* 99 119*  BUN 47*   < > 22 15 12   CREATININE 1.20   < > 1.08 1.02 1.03  CALCIUM 7.7*   < > 8.2* 8.2* 8.2*  PROT 5.1*  --  5.6* 5.6*  --   ALBUMIN 2.6*  --  2.9* 2.9*  --   AST 94*  --  137* 107*  --   ALT 51*  --  79* 69*  --   ALKPHOS 61  --  67 67  --   BILITOT 0.9  --  1.0 1.2  --   GFRNONAA 57*   < > >60 >60 >60  GFRAA >60   < > >60 >60 >60  ANIONGAP 9   < > 7 7 5    < > = values in this interval not displayed.     Hematology Recent Labs  Lab 11/07/17 [phone] 11/08/17 0427 11/09/17  0343  WBC 5.9 8.0 10.8*  RBC 2.87* 2.86* 2.82*  HGB 8.6* 8.5* 8.4*  HCT 26.8* 26.6* 26.5*  MCV 93.4 93.0 94.0  MCH 30.0 29.7 29.8  MCHC 32.1 32.0 31.7  RDW 13.6 13.5 13.6  PLT 158 216 266    Cardiac EnzymesNo results for input(s): TROPONINI in the last 168 hours. No results for input(s): TROPIPOC in the last 168 hours.   BNPNo results for input(s): BNP, PROBNP in the last 168 hours.   DDimer No results for input(s): DDIMER in the last 168 hours.   Radiology    No results found.  Cardiac Studies   ECHO 11/06/2017: Study Conclusions: - Left ventricle: The cavity size was normal. Systolic function was mildly to moderately reduced. The estimated ejection fraction was in the range of 40% to 45%. - Aortic valve: Mildly calcified annulus. Moderately thickened, moderately calcified leaflets. There was moderate stenosis. Valve area (VTI): 1.59 cm^2. Valve area (Vmax): 1.61 cm^2. Valve area (Vmean): 1.34 cm^2. - Mitral valve: There was mild regurgitation. - Left atrium: The atrium was moderately dilated. - Right atrium: The atrium was mildly dilated. - Pulmonary arteries: Systolic pressure was mildly increased. PA peak  pressure: 34 mm Hg (S).  Patient Profile     SABIR CHARTERS Fritzis a 77 y.o.malewith a hx of aortic aneurysm status post Bentall procedure in 2002 with mechanical AVR, coronary artery disease nonobstructive in 2002, hypertension, hyperlipidemia, nonischemic cardiomyopathy status post Saint Jude CRT-D in 2014, and persistent atrial fibrillationwho is being seen today for the evaluation ofICD shocksat the request of [employer].  Assessment & Plan    1.Atrial Fibrillationwith RVR - Patient presented to [facility] ED after experiencing multiple ICD shocks and was found to be in wide-complex tachycardia presumed to be VT. Patient was also found to have hematochezia with a hemoglobin of 11.7 and INR of 9.7. Patient was hemodynamically stable and transferred to [facility] for Cardiology and GI consultation.  - With review of device interrogation, it appears shocks were likely due to rapid atrial fibrillation not VT. This is likely due to acute GI bleed with profound anemia. -Telemetry shows patient is back in his AV pacing rhythm, rates  - Continue Toprol XL 100mg  twice daily. - INR continuing to trend down. INR 1.29 this morning (1.6 yesterday). - Continue Coumadin. Continue bridging with Heparin. Would like to see INR trending up before transitioning from Heparin to Lovenox. -->    In discussion with the other cardiologists and our pharmacy team, the preferential timing for switching to Lovenox to bridge going home from heparin would be once the INR starts to trend back up from its nadir. -->  Would initiate Lovenox training now in order to have him ready for discharge.  He looks to be back in a more normal sinus rhythm versus paced but difficult to tell.  Rate seems to be relatively well controlled on increased dose of beta-blocker.  2.Multiple ICD shocks for AF with RVR -ICD interrogation reviewed at lengthyesterday by Dr. Allred&VT zone adjusted from 180 bpm to a  single VF zone of 222 bpm to minimize shocks. He has never had appropriate ICD therapy prior. This should reduce the opportunity for shocks for atrial fibrillationin the future.  3. GIBleed - Hgb stable today at 8.4 this morning. - INR 1.29 this morning.  - EGD/colonoscopy performed yesterday. GI has cleared patient for discharge.  - Okay to transition back to Coumadin per GI; however, patient is at increased risk  of bleeding due to multiple large ulcers noted in duodenal bulb.  Unfortunately in order to do GI procedure with elevated INR, he was given vitamin K.  We are now awaiting the full effects of vitamin K to see when the INR trends back up again.  4.ChronicSystolic Dysfunction / Nonischemic Cardiomyopathy s/p ICD - Echo 11/06/2017 showed left ventricle with mildly to moderately reduced systolic function with EF of 40-45%; moderately dilated left atrium; mildly dilated right atrium; moderate aortic stenosis; mild mitral regurgitation; PASP mildly increased at 34 mmHg. - No evidence of volume overload on exam.  - Continue Toprol XL at above.  -Will continue to hold Losartan at this time given potential hypovolemia in setting of GI bleed. BP 109/72 this morning. - Patient only takes Lasix as need in outpatient setting. No indication for restarting diuretic at this time.  5. Mechanic AVR - Continue Coumadin.  - Will continue to bridge with Heparin today. Would like to see INR trending up before transitioning from Heparin to Lovenox.     Signed, Darreld Mclean, PA-C  11/09/2017, 9:11 AM     I have seen, examined and evaluated the patient this AM along with Sande Rives, PA-C.  After reviewing all the available data and chart, we discussed the patients laboratory, study & physical findings as well as symptoms in detail. I agree with her findings, examination as well as impression recommendations as per our discussion.    Attending adjustments noted in italics.   Overall  stabilized.  Now intermittently in and out of atrial fib with some pacing.  We have increase his beta-blocker dose and his rate is relatively well controlled.  If blood pressure would allow, we can consider adding diltiazem, however I think some of the tendency for tachycardia is related to his underlying anemia.  The question of Lovenox bridging is, up. It is acceptable to use Lovenox bridging to wait a therapeutic INR, however we would like to see the INR trending up as opposed down prior to doing this.  I suspect that if tomorrow we have reached the nadir of INR or have trended back up again by tomorrow that we can start moving toward Lovenox bridging.  He would need an INR check on Monday morning.   Glenetta Hew, M.D., M.S. Interventional Cardiologist   Pager # [phone] Phone # [phone] [address] [place], [place] [address]     For questions or updates, please contact [place] Please consult [email or web address] for contact info under

## 2017-11-09 NOTE — Progress Notes (Signed)
PROGRESS NOTE    Timothy BOUGIE Sr.  SHF:[id number] DOB: 1940/12/13 DOA: 11/05/2017 PCP: [name], MD      Brief Narrative:  Timothy Fritz is a 77 y.o. M with NICM with ICD, EF 40%, mech AVR and AF on warfarin, and HTN who presented with several days melena, then abdominal pain and hematemesis.        Assessment & Plan:  Upper GI bleed Upper and lower endoscopy on 9/25.  There were several gastric and duodenal ulcers, large, but without stigmata of high risk for rebleeding. -Continue pantoprazole twice daily -We will need 2 months twice daily pantoprazole, then daily indefinitely while on warfarin -We will need [facility] follow-up at discharge -Follow-up H. pylori     Atrial fibrillation with RVR, paroxysmal CHA2DS2-Vasc 4 for age, HTN, CHF.  Complicated by AVR, CHF, age. -Continue heparin gtt -Insurance cover only once daily Lovenox, which would be suboptimal, may need full heparin bridge to INR 2.5  Acute blood loss anemia Hgb remains stable -Transfusion threshold 8 g/dL -Continue iron  Multiple ICD shocks  Nonischemic cardiomyopathy Mechanical aortic valve Goal INR 2.5-3.5 given EF and AF (JACC 2017).  EF 40-45%.  Rates better controlled today with higher dose of metoprolol. -Continue atorvastatin, metoprolol    Recent umbilical hernia repair  Other medications -Continue iron  Transaminitis Asymptomatic.  Unclear chronicity.  Patient denies history of abnormal liver tests.  -Follow up with PCP          DVT prophylaxis: N/A on heparin Code Status: FULL Family Communication: None present MDM and disposition Plan: The below labs and imaging reports were reviewed and summarized above.  Medication management as above, including intake regular management.  The patient was admitted with a severe upper GI bleed with greater than 4 g/dL drop in hemoglobin as well as atrial fibrillation with RVR.  His bleeding has stopped and endoscopy is completed and is  on a stable PPI regimen.  Due to his mechanical aortic valve, atrial fibrillation, congestive heart failure, he is at high risk for arterial thromboembolism without therapeutic anticoagulation.  He will need to remain on heparin, or Lovenox will need to be arranged until INR 2.5.    Consultants:   Cardiology  Gastroenterology  Procedures:   Echocardiogram 9/23 LV EF: 40% -   45%  ------------------------------------------------------------------- History:   PMH:  Chronic systolic heart failure.  Atrial fibrillation.  Risk factors:  ICD in place. Mechanical aortic valve replacement. Hypertension.  ------------------------------------------------------------------- Study Conclusions  - Left ventricle: The cavity size was normal. Systolic function was   mildly to moderately reduced. The estimated ejection fraction was   in the range of 40% to 45%. - Aortic valve: Mildly calcified annulus. Moderately thickened,   moderately calcified leaflets. There was moderate stenosis. Valve   area (VTI): 1.59 cm^2. Valve area (Vmax): 1.61 cm^2. Valve area   (Vmean): 1.34 cm^2. - Mitral valve: There was mild regurgitation. - Left atrium: The atrium was moderately dilated. - Right atrium: The atrium was mildly dilated. - Pulmonary arteries: Systolic pressure was mildly increased. PA   peak pressure: 34 mm Hg (S).   EGD 9/25  Colonoscopy 9/25 EGD was performed for hematemesis.  Findings: Normal esophagus, regular Z line at 40 cm from incisors. 3 nonbleeding slightly cratered but mostly superficial gastric ulcers in the antrum, largest 8 mm in dimension. Cardiac fundus normal on retroflexion. 3 large nonbleeding superficial duodenal ulcers with pigmented material found in the duodenal bulb and first portion of  duodenum. Largest ulcer was 2 cm 3 cm in size. Not amenable for endoscopic treatment due to their is large size, not bleeding at present.  Colonoscopy was performed for  hematochezia. Findings: A 6 mm polyp noted to transverse colon, removed via hot snare polypectomy. A 9 mm polyp noted in rectum, removed via hot snare polypectomy. Several scattered small and large mouth diverticula noted in sigmoid and descending colon. The rest of the colon and retroflexion was unremarkable   Recommendations: PPI twice a day for 2 months, then indefinitely while patient is on Coumadin. H pylori stool antigen and treat if positive. Okay to resume regular diet. Patient to be started on heparin drip today, okay to transition to Coumadin, however remains at increased risk of bleeding due to multiple large ulcers noted in duodenal bulb.     Antimicrobials:   None    Subjective: No melena no hematemesis, no hematochezia.  No abdominal pain.  No chest pain, palpitations, leg swelling, orthopnea, dyspnea on exertion, cough, fever, sputum.     Objective: Vitals:   11/08/17 2132 11/09/17 0016 11/09/17 0501 11/09/17 0743  BP: 108/70 100/62 102/65 109/72  Pulse: 80 84 82 79  Resp: (!) 21 20 20 20   Temp: 97.8 F (36.6 C) 97.7 F (36.5 C) (!) 97.5 F (36.4 C) 98.1 F (36.7 C)  TempSrc: Oral Oral Oral Oral  SpO2: 98% 98% 98% 99%  Weight:   89 kg   Height:        Intake/Output Summary (Last 24 hours) at 11/09/2017 1553 Last data filed at 11/09/2017 0956 Gross per 24 hour  Intake 916.26 ml  Output -  Net 916.26 ml   Filed Weights   11/07/17 0448 11/08/17 0700 11/09/17 0501  Weight: 87.9 kg 88.5 kg 89 kg    Examination: General appearance:   Adult male, sitting in the edge of the bed, no acute distress, eating lunch. HEENT: Anicteric, conjunctiva pink, lids and lashes normal. No nasal deformity, discharge, epistaxis.  Lips moist, OP dry no oral lesions, hearing normal.   Skin: Skin warm and dry, no jaundice, no suspicious rashes or lesions. Cardiac: Irregularly irregular, normal rate.  No murmurs appreciated.  Mechanical S2.  Normal JVP.  No lower extremity  edema. Respiratory: Normal respiratory rate and rhythm, lungs clear without rales or wheezes. Abdomen: Abdomen soft without tenderness to palpation, no ascites or distention.   MSK: No deformities or effusions. Neuro: Awake and alert, extraocular movements intact, moves all extremities with normal coordination and strength.  Speech fluent. Psych: Sensorium intact responding to questions, attention normal, affect normal.  Judgment insight normal.    Data Reviewed: I have personally reviewed following labs and imaging studies:  CBC: Recent Labs  Lab 11/05/17 1518  11/06/17 0926 11/06/17 1700 11/07/17 0517 11/08/17 0427 11/09/17 0343  WBC 7.4   < > 6.5 6.2 5.9 8.0 10.8*  NEUTROABS 5.2  --   --   --   --   --   --   HGB 9.5*   < > 9.5* 8.5* 8.6* 8.5* 8.4*  HCT 29.6*   < > 29.3* 26.7* 26.8* 26.6* 26.5*  MCV 91.4   < > 92.1 92.7 93.4 93.0 94.0  PLT 101*   < > 146* 141* 158 216 266   < > = values in this interval not displayed.   Basic Metabolic Panel: Recent Labs  Lab 11/05/17 1518 11/06/17 0318 11/07/17 0517 11/08/17 0427 11/09/17 0343  NA 140 143 140 139 138  K 4.1 3.7 3.7 4.1 4.0  CL 110 111 108 107 109  CO2 21* 25 25 25 24   GLUCOSE 111* 109* 116* 99 119*  BUN 47* 37* 22 15 12   CREATININE 1.20 1.17 1.08 1.02 1.03  CALCIUM 7.7* 7.9* 8.2* 8.2* 8.2*  MG 2.0  --   --   --   --   PHOS 1.7*  --  3.1  --   --    GFR: Estimated Creatinine Clearance: 65.1 mL/min (by C-G formula based on SCr of 1.03 mg/dL). Liver Function Tests: Recent Labs  Lab 11/05/17 1518 11/07/17 0517 11/08/17 0427  AST 94* 137* 107*  ALT 51* 79* 69*  ALKPHOS 61 67 67  BILITOT 0.9 1.0 1.2  PROT 5.1* 5.6* 5.6*  ALBUMIN 2.6* 2.9* 2.9*   No results for input(s): LIPASE, AMYLASE in the last 168 hours. No results for input(s): AMMONIA in the last 168 hours. Coagulation Profile: Recent Labs  Lab 11/05/17 1518 11/06/17 0318 11/07/17 0517 11/08/17 0427 11/09/17 0343  INR 8.35* 3.70 3.61 1.60  1.29   Cardiac Enzymes: No results for input(s): CKTOTAL, CKMB, CKMBINDEX, TROPONINI in the last 168 hours. BNP (last 3 results) No results for input(s): PROBNP in the last 8760 hours. HbA1C: No results for input(s): HGBA1C in the last 72 hours. CBG: No results for input(s): GLUCAP in the last 168 hours. Lipid Profile: No results for input(s): CHOL, HDL, LDLCALC, TRIG, CHOLHDL, LDLDIRECT in the last 72 hours. Thyroid Function Tests: No results for input(s): TSH, T4TOTAL, FREET4, T3FREE, THYROIDAB in the last 72 hours. Anemia Panel: No results for input(s): VITAMINB12, FOLATE, FERRITIN, TIBC, IRON, RETICCTPCT in the last 72 hours. Urine analysis:    Component Value Date/Time   COLORURINE AMBER (A) 04/17/2012 1751   APPEARANCEUR CLOUDY (A) 04/17/2012 1751   LABSPEC 1.027 04/17/2012 1751   PHURINE 6.0 04/17/2012 1751   GLUCOSEU NEGATIVE 04/17/2012 1751   HGBUR NEGATIVE 04/17/2012 1751   BILIRUBINUR SMALL (A) 04/17/2012 1751   KETONESUR 15 (A) 04/17/2012 1751   PROTEINUR 30 (A) 04/17/2012 1751   UROBILINOGEN 1.0 04/17/2012 1751   NITRITE NEGATIVE 04/17/2012 1751   LEUKOCYTESUR LARGE (A) 04/17/2012 1751   Sepsis Labs: @LABRCNTIP (procalcitonin:4,lacticacidven:4)  ) Recent Results (from the past 240 hour(s))  MRSA PCR Screening     Status: None   Collection Time: 11/05/17  2:08 PM  Result Value Ref Range Status   MRSA by PCR NEGATIVE NEGATIVE Final    Comment:        The GeneXpert MRSA Assay (FDA approved for NASAL specimens only), is one component of a comprehensive MRSA colonization surveillance program. It is not intended to diagnose MRSA infection nor to guide or monitor treatment for MRSA infections. Performed at [facility], [place] [address], [place], [place] [address]          Radiology Studies: No results found.      Scheduled Meds: . atorvastatin  10 mg Oral q1800  . cholecalciferol  1,000 Units Oral Daily  . enoxaparin   Does not apply Once   . ferrous sulfate  325 mg Oral QODAY  . metoprolol succinate  100 mg Oral BID  . omega-3 acid ethyl esters  1,000 mg Oral Daily  . pantoprazole  40 mg Oral BID  . tamsulosin  0.4 mg Oral QHS  . vitamin C  500 mg Oral Daily  . warfarin  10 mg Oral ONCE-1800  . Warfarin - Pharmacist Dosing Inpatient   Does not apply  q1800   Continuous Infusions: . sodium chloride Stopped (11/07/17 2055)  . heparin 1,300 Units/hr (11/09/17 0700)     LOS: 4 days    Time spent: 25 minutes   Edwin Dada, MD [facility] 11/09/2017, 3:53 PM     Pager [phone] --- please page though AMION:  [email or web address] Password [email or web address] If 7PM-7AM, please contact night-coverage

## 2017-11-09 NOTE — Evaluation (Signed)
Physical Therapy Evaluation Patient Details Name: Timothy WHEELING Sr. MRN: [id number] DOB: 18-Oct-1940 Today's Date: 11/09/2017   History of Present Illness  Mr. Timothy Fritz is a 77 y.o. M with NICM with ICD, EF 40%, mech AVR and AF on warfarin, and HTN who presented with several days melena, then abdominal pain and hematemesis.    Clinical Impression  Patient evaluated by Physical Therapy with no further acute PT needs identified. All education has been completed and the patient has no further questions. PTA pt independent, today ambulating unit without assistance. Discussed reconditioning recs as he states he feels weak and has not ambulated hallway distances in 4-5 days. No need for PT follow up however advised patient if he is not where he'd like to be in a several weeks post d/c to seek PT referel for more assistance. Pt agreeable. VSS on RA. See below for any follow-up Physical Therapy or equipment needs. PT is signing off. Thank you for this referral.     Follow Up Recommendations No PT follow up    Equipment Recommendations  None recommended by PT    Recommendations for Other Services       Precautions / Restrictions Precautions Precautions: None      Mobility  Bed Mobility Overal bed mobility: Independent                Transfers Overall transfer level: Independent                  Ambulation/Gait Ambulation/Gait assistance: Independent Gait Distance (Feet): 300 Feet Assistive device: None Gait Pattern/deviations: WFL(Within Functional Limits) Gait velocity: normal   General Gait Details: no balance deficts or unsteadiness noted, minor weakness reported from paitnet from prolonged bed rest  Stairs            Wheelchair Mobility    Modified Rankin (Stroke Patients Only)       Balance Overall balance assessment: Independent                                           Pertinent Vitals/Pain Pain Assessment: No/denies pain     Home Living Family/patient expects to be discharged to:: Private residence Living Arrangements: Alone   Type of Home: House Home Access: Stairs to enter Entrance Stairs-Rails: Can reach both Entrance Stairs-Number of Steps: 2 Home Layout: One level Home Equipment: None      Prior Function Level of Independence: Independent               Hand Dominance        Extremity/Trunk Assessment   Upper Extremity Assessment Upper Extremity Assessment: Overall WFL for tasks assessed    Lower Extremity Assessment Lower Extremity Assessment: Overall WFL for tasks assessed    Cervical / Trunk Assessment Cervical / Trunk Assessment: Normal  Communication   Communication: No difficulties  Cognition Arousal/Alertness: Awake/alert                                            General Comments      Exercises     Assessment/Plan    PT Assessment Patent does not need any further PT services  PT Problem List         PT Treatment Interventions      PT  Goals (Current goals can be found in the Care Plan section)  Acute Rehab PT Goals Patient Stated Goal: get walking again PT Goal Formulation: With patient    Frequency     Barriers to discharge        Co-evaluation               AM-PAC PT "6 Clicks" Daily Activity  Outcome Measure Difficulty turning over in bed (including adjusting bedclothes, sheets and blankets)?: None Difficulty moving from lying on back to sitting on the side of the bed? : None Difficulty sitting down on and standing up from a chair with arms (e.g., wheelchair, bedside commode, etc,.)?: None Help needed moving to and from a bed to chair (including a wheelchair)?: None Help needed walking in hospital room?: None Help needed climbing 3-5 steps with a railing? : None 6 Click Score: 24    End of Session Equipment Utilized During Treatment: Gait belt     Nurse Communication: Mobility status      Time:  4401-0272 PT Time Calculation (min) (ACUTE ONLY): 13 min   Charges:   PT Evaluation $PT Eval Low Complexity: [place], PT, DPT Acute Rehabilitation Services Pager: [phone] Office: [phone]    Timothy Fritz 11/09/2017, 10:51 AM

## 2017-11-10 ENCOUNTER — Inpatient Hospital Stay (HOSPITAL_COMMUNITY): Payer: Medicare HMO

## 2017-11-10 DIAGNOSIS — K2991 Gastroduodenitis, unspecified, with bleeding: Secondary | ICD-10-CM

## 2017-11-10 DIAGNOSIS — K264 Chronic or unspecified duodenal ulcer with hemorrhage: Principal | ICD-10-CM

## 2017-11-10 HISTORY — PX: IR US GUIDE VASC ACCESS RIGHT: IMG2390

## 2017-11-10 HISTORY — PX: IR ANGIOGRAM VISCERAL SELECTIVE: IMG657

## 2017-11-10 HISTORY — PX: IR EMBO ART  VEN HEMORR LYMPH EXTRAV  INC GUIDE ROADMAPPING: IMG5450

## 2017-11-10 HISTORY — PX: IR ANGIOGRAM SELECTIVE EACH ADDITIONAL VESSEL: IMG667

## 2017-11-10 LAB — BASIC METABOLIC PANEL
Anion gap: 5 (ref 5–15)
BUN: 19 mg/dL (ref 8–23)
CHLORIDE: 109 mmol/L (ref 98–111)
CO2: 24 mmol/L (ref 22–32)
Calcium: 7.8 mg/dL — ABNORMAL LOW (ref 8.9–10.3)
Creatinine, Ser: 1.04 mg/dL (ref 0.61–1.24)
GFR calc Af Amer: >60 mL/min (ref >60)
GLUCOSE: 113 mg/dL — AB (ref 70–99)
POTASSIUM: 4.4 mmol/L (ref 3.5–5.1)
Sodium: 138 mmol/L (ref 135–145)

## 2017-11-10 LAB — HEMOGLOBIN AND HEMATOCRIT, BLOOD
HCT: 23.3 % — ABNORMAL LOW (ref 39.0–52.0)
HEMATOCRIT: 25 % — AB (ref 39.0–52.0)
HEMOGLOBIN: 8.2 g/dL — AB (ref 13.0–17.0)
Hemoglobin: 7.6 g/dL — ABNORMAL LOW (ref 13.0–17.0)

## 2017-11-10 LAB — PROTIME-INR
INR: 1.59
INR: 2
PROTHROMBIN TIME: 18.8 s — AB (ref 11.4–15.2)
Prothrombin Time: 22.5 seconds — ABNORMAL HIGH (ref 11.4–15.2)

## 2017-11-10 LAB — CBC
HEMATOCRIT: 21.1 % — AB (ref 39.0–52.0)
Hemoglobin: 6.7 g/dL — CL (ref 13.0–17.0)
MCH: 29.8 pg (ref 26.0–34.0)
MCHC: 31.8 g/dL (ref 30.0–36.0)
MCV: 93.8 fL (ref 78.0–100.0)
PLATELETS: 266 10*3/uL (ref 150–400)
RBC: 2.25 MIL/uL — ABNORMAL LOW (ref 4.22–5.81)
RDW: 13.9 % (ref 11.5–15.5)
WBC: 13.4 10*3/uL — ABNORMAL HIGH (ref 4.0–10.5)

## 2017-11-10 LAB — PREPARE RBC (CROSSMATCH)

## 2017-11-10 LAB — HEPARIN LEVEL (UNFRACTIONATED): Heparin Unfractionated: 0.43 IU/mL (ref 0.30–0.70)

## 2017-11-10 LAB — GLUCOSE, CAPILLARY: GLUCOSE-CAPILLARY: 111 mg/dL — AB (ref 70–99)

## 2017-11-10 MED ORDER — SODIUM CHLORIDE 0.9% IV SOLUTION: Freq: Once | INTRAVENOUS | Status: DC

## 2017-11-10 MED ORDER — MIDAZOLAM HCL 2 MG/2ML IJ SOLN
INTRAMUSCULAR | Status: AC | PRN
  Administered 2017-11-10: 1 mg via INTRAVENOUS

## 2017-11-10 MED ORDER — MIDAZOLAM HCL 2 MG/2ML IJ SOLN
INTRAMUSCULAR | Status: AC
  Filled 2017-11-10: qty 4

## 2017-11-10 MED ORDER — IOPAMIDOL (ISOVUE-300) INJECTION 61%
INTRAVENOUS | Status: AC
  Filled 2017-11-10: qty 100

## 2017-11-10 MED ORDER — TECHNETIUM TC 99M-LABELED RED BLOOD CELLS IV KIT
26.3000 | PACK | Freq: Once | INTRAVENOUS | Status: AC | PRN
  Administered 2017-11-10: 26.3 via INTRAVENOUS

## 2017-11-10 MED ORDER — PHYTONADIONE 5 MG PO TABS
5.0000 mg | ORAL_TABLET | Freq: Once | ORAL | Status: AC
  Administered 2017-11-10: 5 mg via ORAL
  Filled 2017-11-10 (×2): qty 1

## 2017-11-10 MED ORDER — HEPARIN (PORCINE) IN NACL 100-0.45 UNIT/ML-% IJ SOLN: 1300.0000 [IU]/h | INTRAMUSCULAR | Status: DC

## 2017-11-10 MED ORDER — LIDOCAINE HCL 1 % IJ SOLN
INTRAMUSCULAR | Status: AC
  Filled 2017-11-10: qty 20

## 2017-11-10 MED ORDER — SODIUM CHLORIDE 0.9 % IV SOLN
8.0000 mg/h | INTRAVENOUS | Status: AC
  Administered 2017-11-10 – 2017-11-13 (×8): 8 mg/h via INTRAVENOUS
  Filled 2017-11-10 (×12): qty 80

## 2017-11-10 MED ORDER — IOPAMIDOL (ISOVUE-300) INJECTION 61%
INTRAVENOUS | Status: AC
  Filled 2017-11-10: qty 200

## 2017-11-10 MED ORDER — SODIUM CHLORIDE 0.9 % IV SOLN
80.0000 mg | Freq: Once | INTRAVENOUS | Status: AC
  Administered 2017-11-10: 80 mg via INTRAVENOUS
  Filled 2017-11-10: qty 80

## 2017-11-10 MED ORDER — PANTOPRAZOLE SODIUM 40 MG IV SOLR
40.0000 mg | Freq: Two times a day (BID) | INTRAVENOUS | Status: DC
  Administered 2017-11-14 – 2017-11-15 (×4): 40 mg via INTRAVENOUS
  Filled 2017-11-10 (×7): qty 40

## 2017-11-10 MED ORDER — FENTANYL CITRATE (PF) 100 MCG/2ML IJ SOLN
INTRAMUSCULAR | Status: AC | PRN
  Administered 2017-11-10: 50 ug via INTRAVENOUS

## 2017-11-10 MED ORDER — SODIUM CHLORIDE 0.9 % IV SOLN
1.0000 g | INTRAVENOUS | Status: AC
  Administered 2017-11-10 – 2017-11-14 (×5): 1 g via INTRAVENOUS
  Filled 2017-11-10 (×5): qty 10

## 2017-11-10 MED ORDER — FENTANYL CITRATE (PF) 100 MCG/2ML IJ SOLN
INTRAMUSCULAR | Status: AC
  Filled 2017-11-10: qty 4

## 2017-11-10 MED ORDER — LIDOCAINE HCL (PF) 1 % IJ SOLN
INTRAMUSCULAR | Status: AC | PRN
  Administered 2017-11-10: 5 mL

## 2017-11-10 NOTE — Progress Notes (Signed)
Pt with HR sustaining in 120s - Dr. Ellyn Hack at bedside, stated to go ahead and give metoprolol - BP: 113/66, HR 122 - medication given as ordered. Will continue to monitor patient closely.

## 2017-11-10 NOTE — Progress Notes (Signed)
Patient just urinated and had bowel movement in toilet, blood noted in toilet.  Awaiting lab to draw type and screen for blood administration.  RN text paged [facility] with this information.

## 2017-11-10 NOTE — Progress Notes (Signed)
ANTICOAGULATION CONSULT NOTE   Pharmacy Consult for Heparin Indication: Mechanical AVR + Afib  No Known Allergies  Patient Measurements: Height: 5\' 8"  (172.7 cm) Weight: 195 lb 12.3 oz (88.8 kg) IBW/kg (Calculated) : 68.4 Heparin Dosing Weight: 86.4 kg  Vital Signs: Temp: 98 F (36.7 C) (09/27 1845) Temp Source: Oral (09/27 1833) BP: 123/67 (09/27 1856) Pulse Rate: 106 (09/27 1856)  Labs: Recent Labs    11/08/17 0427 11/08/17 1849 11/09/17 0343 11/10/17 0435 11/10/17 1630 11/10/17 1752  HGB 8.5*  --  8.4* 6.7* 7.6*  --   HCT 26.6*  --  26.5* 21.1* 23.3*  --   PLT 216  --  266 266  --   --   LABPROT 18.9*  --  16.0* 18.8*  --  22.5*  INR 1.60  --  1.29 1.59  --  2.00  HEPARINUNFRC  --  0.33 0.44 0.43  --   --   CREATININE 1.02  --  1.03 1.04  --   --     Estimated Creatinine Clearance: 64.4 mL/min (by C-G formula based on SCr of 1.04 mg/dL).   Medical History: Past Medical History:  Diagnosis Date  . AICD (automatic cardioverter/defibrillator) present 07/11/2012  . Aortic aneurysm, thoracic ([place])    Fusiform s/p Bentall procedure 2002  . Aortic valve, bicuspid    Mechanical AVR  . Basal cell carcinoma of face    "burned off" (11/06/2017)  . BPH (benign prostatic hypertrophy)   . CHF (congestive heart failure) ([place])    "once" (11/06/2017)  . Coronary atherosclerosis of native coronary artery    Nonobstructive at cardiac catherization 2002  . Essential hypertension, benign   . Left bundle branch block   . Mixed hyperlipidemia   . Nonischemic cardiomyopathy (HCC)    CRT-D  . Persistent atrial fibrillation ([place])   . Presence of permanent cardiac pacemaker     Assessment: 69 YOF who presented on 9/22 with ICD shocks and concern for lower GIB. The patient was on warfarin PTA for hx mech AVR + Afib. Admit INR elevated at 8.35 in the setting of recent outpatient Bactrim on PTA dosing of 5 mg daily.   S/p embolization of GDA in IR 9/27 PM, no active bleeding in  IR and consulted to resume heparin at this time.     Goal of Therapy:  Heparin goal 0.3-0.7 INR goal 2.5-3.5 Monitor platelets by anticoagulation protocol: Yes   Plan:  Resume heparin gtt at 1300 units/hr without bolus 8 hour heparin level Daily heparin level, CBC, s/s bleeding F/u for warfarin restart  Bertis Ruddy, PharmD Clinical Pharmacist Please check AMION for all [place] numbers 11/10/2017 7:36 PM

## 2017-11-10 NOTE — Progress Notes (Signed)
Pt went for nuclear bleeding scan which localized to stomach/ duodenal area. S/P embolization of GDA by IR at 5:30 pm, no active bleeding per IR report.  Have d/w IR and can't go back to floor bed /tele/ SDU because INR too high to remove sheath and will require ICU stay.  Will have to resume IV heparin w/ valve issues, have discussed w/ GI and IR and pharmacy, pharm will write the orders. IR notes need to get sheath out sooner than later so they recommend giving vit K, will d/w pharm about dosing.  Have d/w ICU they will accept patient.    Kelly Splinter MD [facility] pgr [phone] 07/09/2017, 9:25 AM
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
## 2017-11-10 NOTE — Procedures (Signed)
Mesentaric Angio and Embo  No sign of bleeding  EMpiric ambolization of GDA  R femoral arterial sheath in place KVO SNS  EBL 0  Comp 0

## 2017-11-10 NOTE — Sedation Documentation (Signed)
Pt hypotensive and tachycardic. MD requested that writer call hospitalist to get orders for blood administration. Writer call [facility] RN to assist with administration.

## 2017-11-10 NOTE — Progress Notes (Signed)
Patient's hemoglobin 6.7 this morning, yesterday morning 8.4.  Currently has IV heparin infusing, [facility] p[facility] with this information.

## 2017-11-10 NOTE — Progress Notes (Signed)
Subjective: Seen and examined at bedside. He had an episode of melanotic bowel movement 4 AM followed by bloody bowel movement. His systolic blood pressure dropped to 70s, and is now receiving 2 units of PRBC transfusion.  Objective: Vital signs in last 24 hours: Temp:  [98.1 F (36.7 C)-98.7 F (37.1 C)] 98.7 F (37.1 C) (09/27 1128) Pulse Rate:  [81-126] 121 (09/27 1156) Resp:  [17-24] 21 (09/27 1130) BP: (77-113)/(49-74) 113/66 (09/27 1156) SpO2:  [97 %-99 %] 97 % (09/27 1128) Weight:  [88.8 kg] 88.8 kg (09/27 0607) Weight change: -0.196 kg Last BM Date: 11/10/17  MO:QHUTMLY comfortable, mild pallor GENERAL:not in distress ABDOMEN:soft, nondistended, normoactive bowel sounds EXTREMITIES:no deformity, no edema  Lab Results: Results for orders placed or performed during the hospital encounter of 11/05/17 (from the past 48 hour(s))  Heparin level (unfractionated)     Status: None   Collection Time: 11/08/17  6:49 PM  Result Value Ref Range   Heparin Unfractionated 0.33 0.30 - 0.70 IU/mL    Comment: (NOTE) If heparin results are below expected values, and patient dosage has  been confirmed, suggest follow up testing of antithrombin III levels. Performed at [facility], [place] [address] [place], [place] [address]   Protime-INR     Status: Abnormal   Collection Time: 11/09/17  3:43 AM  Result Value Ref Range   Prothrombin Time 16.0 (H) 11.4 - 15.2 seconds   INR 1.29     Comment: Performed at [facility] [address], [place], [place] [address]  CBC     Status: Abnormal   Collection Time: 11/09/17  3:43 AM  Result Value Ref Range   WBC 10.8 (H) 4.0 - 10.5 K/uL   RBC 2.82 (L) 4.22 - 5.81 MIL/uL   Hemoglobin 8.4 (L) 13.0 - 17.0 g/dL   HCT 26.5 (L) 39.0 - 52.0 %   MCV 94.0 78.0 - 100.0 fL   MCH 29.8 26.0 - 34.0 pg   MCHC 31.7 30.0 - 36.0 g/dL   RDW 13.6 11.5 - 15.5 %   Platelets 266 150 - 400 K/uL    Comment: Performed at [facility], [place]  [address] [place], [place] [address]  Basic metabolic panel     Status: Abnormal   Collection Time: 11/09/17  3:43 AM  Result Value Ref Range   Sodium 138 135 - 145 mmol/L   Potassium 4.0 3.5 - 5.1 mmol/L   Chloride 109 98 - 111 mmol/L   CO2 24 22 - 32 mmol/L   Glucose, Bld 119 (H) 70 - 99 mg/dL   BUN 12 8 - 23 mg/dL   Creatinine, Ser 1.03 0.61 - 1.24 mg/dL   Calcium 8.2 (L) 8.9 - 10.3 mg/dL   GFR calc non Af Amer >60 >60 mL/min   GFR calc Af Amer >60 >60 mL/min    Comment: (NOTE) The eGFR has been calculated using the CKD EPI equation. This calculation has not been validated in all clinical situations. eGFR's persistently <60 mL/min signify possible Chronic Kidney Disease.    Anion gap 5 5 - 15    Comment: Performed at [facility] [address], [place], [place] [address]  Heparin level (unfractionated)     Status: None   Collection Time: 11/09/17  3:43 AM  Result Value Ref Range   Heparin Unfractionated 0.44 0.30 - 0.70 IU/mL    Comment: (NOTE) If heparin results are below expected values, and patient dosage has  been confirmed, suggest follow up testing of antithrombin III levels. Performed at [facility], [place] [address] [place], [place] [address]   Protime-INR     Status: Abnormal   Collection Time: 11/10/17  4:35 AM  Result Value Ref Range   Prothrombin Time 18.8 (H) 11.4 - 15.2 seconds   INR 1.59     Comment: Performed at [facility] [address], [place], [place] [address]  Heparin level (unfractionated)     Status: None   Collection Time: 11/10/17  4:35 AM  Result Value Ref Range   Heparin Unfractionated 0.43 0.30 - 0.70 IU/mL    Comment: (NOTE) If heparin results are below expected values, and patient dosage has  been confirmed, suggest follow up testing of antithrombin III levels. Performed at [facility], [place] [address], [place] [address]   CBC     Status: Abnormal   Collection Time: 11/10/17  4:35 AM  Result Value Ref  Range   WBC 13.4 (H) 4.0 - 10.5 K/uL   RBC 2.25 (L) 4.22 - 5.81 MIL/uL   Hemoglobin 6.7 (LL) 13.0 - 17.0 g/dL    Comment: REPEATED TO VERIFY CRITICAL RESULT CALLED TO, READ BACK BY AND VERIFIED WITH: D.Assunta Curtis 5638 11/10/17 M.CAMPBELL    HCT 21.1 (L) 39.0 - 52.0 %   MCV 93.8 78.0 - 100.0 fL   MCH 29.8 26.0 - 34.0 pg   MCHC 31.8 30.0 - 36.0 g/dL   RDW 13.9 11.5 - 15.5 %   Platelets 266 150 - 400 K/uL    Comment: Performed at [facility] [address] [place], [place] [address]  Basic metabolic panel     Status: Abnormal   Collection Time: 11/10/17  4:35 AM  Result Value Ref Range   Sodium 138 135 - 145 mmol/L   Potassium 4.4 3.5 - 5.1 mmol/L   Chloride 109 98 - 111 mmol/L   CO2 24 22 - 32 mmol/L   Glucose, Bld 113 (H) 70 - 99 mg/dL   BUN 19 8 - 23 mg/dL   Creatinine, Ser 1.04 0.61 - 1.24 mg/dL   Calcium 7.8 (L) 8.9 - 10.3 mg/dL   GFR calc non Af Amer >60 >60 mL/min   GFR calc Af Amer >60 >60 mL/min    Comment: (NOTE) The eGFR has been calculated using the CKD EPI equation. This calculation has not been validated in all clinical situations. eGFR's persistently <60 mL/min signify possible Chronic Kidney Disease.    Anion gap 5 5 - 15    Comment: Performed at [facility] [address], [place], [place] [address]  Type and screen [place]     Status: None (Preliminary result)   Collection Time: 11/10/17  6:20 AM  Result Value Ref Range   ABO/RH(D) A POS    Antibody Screen NEG    Sample Expiration 11/13/2017    Unit Number [id number]    Blood Component Type RBC LR PHER2    Unit division 00    Status of Unit ISSUED    Transfusion Status OK TO TRANSFUSE    Crossmatch Result Compatible    Unit Number [id number]    Blood Component Type RED CELLS,LR    Unit division 00    Status of Unit ISSUED    Transfusion Status OK TO TRANSFUSE    Crossmatch Result      Compatible Performed at [facility], [place]  [address],  [place], [place] [address]   Prepare RBC     Status: None   Collection Time: 11/10/17  6:20 AM  Result Value Ref Range   Order Confirmation      ORDER PROCESSED BY BLOOD BANK Performed at [facility], [place] [address] [place], [place] [address]     Studies/Results: No results found.  Medications: I have reviewed the patient's current medications.  Assessment: Melena/hematochezia today morning. EGD showed multiple antral ulcers and 3 large duodenal bulb ulcers with pigmentation. Colonoscopy showed diverticulosis and removal of tubular adenomas from transverse and sigmoid colon. He received Coumadin 10 mg PO last night, and was on heparin drip. He has mechanical aortic valve and Atrial fibrillation.  Plan: Bleeding scan stat. If active bleeding noted patient will need IR guided embolization. Currently heparin on hold. Blood pressure has improved to 113/66 mmHg with heart rate is around 121/irregular. We'll restart patient on IV Protonix drip at 8 mg per hour.   Ronnette Juniper 11/10/2017, 12:03 PM   Pager [phone] If no answer or after 5 PM call [phone]

## 2017-11-10 NOTE — Progress Notes (Addendum)
Progress Note  Patient Name: Timothy ALLRED Sr. 77. Date of Encounter: 11/10/2017  Primary Cardiologist: Rozann Lesches, MD   Subjective   Recurrent GI bleeding overnight. Pt reports melanotic stools + hematochezia with BM overnight. Hgb dropped from 8.4>>6.7. Pt became lightheaded when trying to ambulate to bathroom this morning. Resting comfortably currently in bed, getting blood transfusion. Can feel palpitations, HR in the 110s-120s.   Inpatient Medications    Scheduled Meds: . sodium chloride   Intravenous Once  . atorvastatin  10 mg Oral q1800  . cholecalciferol  1,000 Units Oral Daily  . ferrous sulfate  325 mg Oral QODAY  . metoprolol succinate  100 mg Oral BID  . omega-3 acid ethyl esters  1,000 mg Oral Daily  . pantoprazole  40 mg Oral BID  . tamsulosin  0.4 mg Oral QHS  . vitamin C  500 mg Oral Daily  . Warfarin - Pharmacist Dosing Inpatient   Does not apply q1800   Continuous Infusions: . sodium chloride Stopped (11/07/17 2055)  . heparin 1,300 Units/hr (11/10/17 0700)   PRN Meds: sodium chloride, acetaminophen, meclizine, senna-docusate, traMADol   Vital Signs    Vitals:   11/09/17 2122 11/10/17 0605 11/10/17 0607 11/10/17 0805  BP: 107/72 102/66  (!) 109/56  Pulse: 98 96    Resp: 20 20  (!) 23  Temp: 98.2 F (36.8 C) 98.1 F (36.7 C)    TempSrc: Oral Oral    SpO2: 98% 97%    Weight:   88.8 kg   Height:        Intake/Output Summary (Last 24 hours) at 11/10/2017 0850 Last data filed at 11/10/2017 0700 Gross per 24 hour  Intake 1267.93 ml  Output -  Net 1267.93 ml   Filed Weights   11/08/17 0700 11/09/17 0501 11/10/17 0607  Weight: 88.5 kg 89 kg 88.8 kg    Telemetry    afib 110s-120s - Personally Reviewed  ECG    Not performed today - Personally Reviewed  Physical Exam   GEN: No acute distress.   Neck: No JVD Cardiac: irregularly irregular, tachy rate, mechanical valve sounds present Respiratory: Clear to auscultation  bilaterally. GI: Soft, nontender, non-distended  MS: No edema; No deformity. Neuro:  Nonfocal  Psych: Normal affect   Labs    Chemistry Recent Labs  Lab 11/05/17 1518  11/07/17 0517 11/08/17 0427 11/09/17 0343 11/10/17 0435  NA 140   < > 140 139 138 138  K 4.1   < > 3.7 4.1 4.0 4.4  CL 110   < > 108 107 109 109  CO2 21*   < > 25 25 24 24   GLUCOSE 111*   < > 116* 99 119* 113*  BUN 47*   < > 22 15 12 19   CREATININE 1.20   < > 1.08 1.02 1.03 1.04  CALCIUM 7.7*   < > 8.2* 8.2* 8.2* 7.8*  PROT 5.1*  --  5.6* 5.6*  --   --   ALBUMIN 2.6*  --  2.9* 2.9*  --   --   AST 94*  --  137* 107*  --   --   ALT 51*  --  79* 69*  --   --   ALKPHOS 61  --  67 67  --   --   BILITOT 0.9  --  1.0 1.2  --   --   GFRNONAA 57*   < > >60 >60 >60 >60  GFRAA >60   < > >  60 >60 >60 >60  ANIONGAP 9   < > 7 7 5 5    < > = values in this interval not displayed.     Hematology Recent Labs  Lab 11/08/17 0427 11/09/17 0343 11/10/17 0435  WBC 8.0 10.8* 13.4*  RBC 2.86* 2.82* 2.25*  HGB 8.5* 8.4* 6.7*  HCT 26.6* 26.5* 21.1*  MCV 93.0 94.0 93.8  MCH 29.7 29.8 29.8  MCHC 32.0 31.7 31.8  RDW 13.5 13.6 13.9  PLT 216 266 266    Cardiac EnzymesNo results for input(s): TROPONINI in the last 168 hours. No results for input(s): TROPIPOC in the last 168 hours.   BNPNo results for input(s): BNP, PROBNP in the last 168 hours.   DDimer No results for input(s): DDIMER in the last 168 hours.   Radiology    No results found.  Cardiac Studies   ECHO 11/06/2017: Study Conclusions: - Left ventricle: The cavity size was normal. Systolic function was mildly to moderately reduced. The estimated ejection fraction was in the range of 40% to 45%. - Aortic valve: Mildly calcified annulus. Moderately thickened, moderately calcified leaflets. There was moderate stenosis. Valve area (VTI): 1.59 cm^2. Valve area (Vmax): 1.61 cm^2. Valve area (Vmean): 1.34 cm^2. - Mitral valve: There was mild  regurgitation. - Left atrium: The atrium was moderately dilated. - Right atrium: The atrium was mildly dilated. - Pulmonary arteries: Systolic pressure was mildly increased. PA peak pressure: 34 mm Hg (S).  Patient Profile     Timothy Fritzis a 77 y.o.malewith a hx of aortic aneurysm status post Bentall procedure in 2002 with mechanical AVR, coronary artery disease nonobstructive in 2002, hypertension, hyperlipidemia, nonischemic cardiomyopathy status post Saint Jude CRT-D in 2014, and persistent atrial fibrillationwho was admitted after multipleICD shocks, due to rapid afib.  Assessment & Plan    1.Atrial Fibrillationwith RVR - Patient presented to [facility] ED after experiencing multiple ICD shocks and was found to be in wide-complex tachycardia presumed to be VT. Patient was also found to have hematochezia with a hemoglobin of 11.7 and INR of 9.7. Patient was hemodynamically stable and transferred to [facility] for Cardiology and GI consultation.  - With review of device interrogation, it appears shocks were likely due to rapid atrial fibrillation not VT. This is likely due to acute GI bleed with profound anemia. -Pt tachycardic on tele w rates in the 110s-120s and soft SBPs in the 90s, secondary to hypovolemia/ ABLA from GIB - Hold AM dose of Toprol XL 100mg for now given hypotension/ hypovolemia, hopefully rates will improve post blood transfusion -INR 1.59 this morning (1.29 yesterday). On IV heparin for bridge until therapeutic INR given mechanical AV. Will need further advise from GI given recurrent bleeding.   2.Multiple ICD shocks for AF with RVR -ICD interrogation reviewed at length by Dr. Rayann Heman this admission&VT zone adjusted from 180 bpm to a single VF zone of 222 bpm. He has never had appropriate ICD therapy prior. This should reduce the opportunity for shocks for atrial fibrillationin the future.  3. GIBleed - GI w/u conducted. EGD showed 3  large nonbleeding superficial duodenal ulcers. Colonoscopy notable for 2 polyps (polypectomy -pathology pending) and diverticulosis. Recs are for BID PPI therapy and pt was also cleared to resume Coumadin w/ heparin bridge given mechanical AV. - Unfortunately, Hgbdropped overnight from 8.4>>6.7. Pt reports melanotic stools + hematochezia with BM overnight. Further management/ recs per GI and IM.  - Transfusion per IM.   4.ChronicSystolic Dysfunction / Nonischemic Cardiomyopathy  s/p ICD - Echo 11/06/2017 showed left ventricle with mildly to moderately reduced systolic function with EF of 40-45%; moderately dilated left atrium; mildly dilated right atrium; moderate aortic stenosis; mild mitral regurgitation; PASP mildly increased at 34 mmHg. - No evidence of volume overload on exam but will need to monitor closely as he is getting blood transfusion - RN advised to hold all antihypertensives, including metoprolol, for now, given hypotension from hypovolemia. Resume once stable.  ->  Blood pressure issues resolved after blood.  Okay to restart beta-blocker.  5. Mechanic AVR -  Coumadin with Heparin bridge. INR remains subtherapeutic at 1.59 today. Plans were to transition from IV heparin to Lovenox, to allow pt to be discharged home, however given recurrent anemia with Hgb down to 6.7 it would be safer to keep inpatient for continued monitoring.     Signed, Lyda Jester, PA-C  11/10/2017, 8:50 AM     I have seen, examined and evaluated the patient this AM along with Ellen Henri, PA.  After reviewing all the available data and chart, we discussed the patients laboratory, study & physical findings as well as symptoms in detail. I agree with her findings, examination as well as impression recommendations as per our discussion.    Unfortunately, it looks like he has started to bleed.  Hemoglobin now down to 6.7.  He is now on a second unit of blood by the time I saw him.  Was a little  hypotensive, so beta-blocker held, but he is now relatively fast in A. fib.  Restart beta-blocker after blood given.  This was the reason for using heparin as opposed Lovenox.  I think we are safe stopping heparin.  Based on guidelines with a mechanical aortic valve will probably safe holding heparin in this setting.  Would like to restart if possible, but at this point would hold off on heparin, continue warfarin as gastroenterologist has no plans of doing further endoscopy.  Plan for now is bleeding scan and then possible embolization.  Would not restart heparin until this evaluation is complete.  Will probably safe for couple days off of IV heparin for his mechanical aortic valve. Given this recent event, I would probably prefer not to do Lovenox bridging for him going forward as an option.  If his INR is trending up on its own closer to 2, would not need to necessarily restart heparin.  Can reevaluate over the weekend.    Glenetta Hew, M.D., M.S. Interventional Cardiologist   Pager # [phone] Phone # [phone] [address] [place], [place] [address]    For questions or updates, please contact [facility] Please consult [email or web address] for contact info under

## 2017-11-10 NOTE — Consult Note (Signed)
..   NAME:  Timothy Fritz., MRN:  [id number], DOB:  [birth year], LOS: 5 ADMISSION DATE:  11/05/2017, CONSULTATION DATE:  11/10/17 REFERRING MD:  [name] MD CHIEF COMPLAINT:  Acute Gastrointestinal Bleed   Brief History   77 yr old male with PMHx of Aortic Aneurysm s/p Bentall procedure in 2002, with mechanical AVR, coronary artery disease nonobstructive in 2002, hypertension, hyperlipidemia, nonischemic cardiomyopathy status post [facility] CRT-D in 2014, and persistent atrial fibrillation on anticoagulation with warfarin (Initial INR supratherapuetic @ 9.7), HTN, h/o diverticulosis along with internal and external hemorrhoids seen on colonoscopy in October 2018 presented to [facility] complaining of abdominal pain, and experienced multiple ICD shocks ->Acute GI Bleed with wide complex tachycardia( presumed to be VT) Hgb 11.7 INR 9.7 -> Trx to [facility] for Cardiology and GI mgmt--> s/p colonoscopy and EGD-> POD 0 IR empiric embolization of GDA  Past Medical History  Aortic Aneurysm s/p Bentall procedure in 2002 Mechanical AVR Coronary artery disease nonobstructive in 2002 Hypertension Hyperlipidemia Nonischemic cardiomyopathy status post [facility] CRT-D in 2014 Persistent atrial fibrillation on anticoagulation with warfarin  Urinary Tract infection prior to recent admission Significant Hospital Events   Acute GI Bleed Firing of AICD x 9 S/p embolization by IR- 0/27/19  Consults: date of consult/date signed off & final recs:  Gastroenterology Cardiology  Procedures (surgical and bedside):  IR Empiric Embolization of Gastroduodenal artery via Right femoral arterial sheath  Significant Diagnostic Tests:  INR 2 on last check  Colonoscopy Findings:      The perianal and digital rectal examinations were normal.      A 6 mm polyp was found in the transverse colon. The polyp was sessile.       The polyp was removed with a hot snare. Resection and retrieval were       complete.  A  9 mm polyp was found in the rectum. The polyp was sessile. The polyp       was removed with a hot snare. Resection and retrieval were complete.      Scattered small and large-mouthed diverticula were found in the sigmoid       colon and descending colon.  The exam was otherwise without abnormality on direct and retroflexion views. Impression:         - One 6 mm polyp in the transverse colon, removed with a hot snare. Resected and retrieved. - One 9 mm polyp in the rectum, removed with a hot snare. Resected and retrieved. - Diverticulosis in the sigmoid colon and in the descending colon.                        EGD findings:  Normal esophagus.  - Z-line regular, 40 cm from the incisors.  - Non-bleeding gastric ulcers with a clean ulcer base (Forrest Class III).  - Multiple non-bleeding duodenal ulcers with pigmented material.  - No specimens collected  Bleeding scan 9/27 FINDINGS: There is a small focus abnormal radiotracer activity which appears to which to originate within the proximal small bowel. The overall amount of abnormal radiotracer activity is very small suggesting a small, transient bleed. Physiologic activity is seen within the heart, liver, urinary bladder, abdominal aorta and pelvic vasculature.  IMPRESSION: Suspected transient proximal small bowel bleed Micro Data:  Pending blood cx x 2 and urine culture  Antimicrobials:   Rocephin IV-  Started on 9/27  Subjective:  Pt lying flat in bed says he has some  moderate abdominal apin in the lower quadrants relieved with the placement of the foley catheter. He denies any other symptoms at this time. He remains awake and alert and responsive  Objective   Blood pressure 127/76, pulse (!) 121, temperature 98 F (36.7 C), resp. rate 20, height 5\' 8"  (1.727 m), weight 88.8 kg, SpO2 100 %.        Intake/Output Summary (Last 24 hours) at 11/10/2017 2105 Last data filed at 11/10/2017 1833 Gross per 24 hour  Intake 1702.06 ml   Output 100 ml  Net 1602.06 ml   Filed Weights   11/08/17 0700 11/09/17 0501 11/10/17 0102  Weight: 88.5 kg 89 kg 88.8 kg    Examination: General: well nourished male pleasant mood- laying completely flat HENT: normocephalic atraumatic with PERRLA Lungs: decreased breath sounds @ base no appreciable wheezing or rhonci Cardiovascular: S1 & S2 irregularly irregular Abdomen: obese, not distended, tender in RLQ and LLQ. Right femoral sheath Extremities: no gross abnormalities. SCDs b/l no lower ext edema Neuro: AAOx4 CN2-12 intact GU: indwelling foley catheter in place  Resolved Hospital Problem list   - Supratherapeutic INR reversed 9->2 - GI Bleed s/p empiric embolixzation of GDA  Assessment & Plan:   Active Issues and Plan of Care:  Admit to ICU overnight while sheath is in place  1.  Gastrointestinal Bleed: s/p IR empiric embolization of Gastroduodenal artery for suspected transient proximal small bowel bleed seen on Bleeding scan 9/27.19: was hypotensive and tachycardic post procedure and received blood transfusion.  Plan continues to lay flat in bed per IR due to presence of right femoral sheath when INR is normalized IR will remove sheath on 9/28 AM per current plan. F/u CBC Q 8. Coags in AM. VitK 5 mg PO was given.  I have discussed with overnight Pharmacist extensively regarding risk benefit of restarting heparin ggt at this time. Pt received heparin on 25th and 26 th. Pt had an episode of hypotension and tachycardia following embolization requiring transfusion. PO Vitamin K takes 12 hrs to be effective and bioavailability may be affected by acute GI issue. Current INR 2 ( last dose of coumadin was 9/26 at 10mg ) ( current INR is below the goal for a mechanical valve 2.5-3.5 however given current clinical condition temporarily normalize INR for sheath removal). After discussion with Pharmacy and E link we Will restart anticoagulation in AM after the femoral sheath is pulled.    2.ICD shocks - s/p ICD interrogation Cardiology adjustedVT zone from 180 bpm to a single VF zone of 222 bpm to minimize shocks. On further investigation the shocks seem to be due to a Rapid Afib not VT.  3.Persistent Afib previously on Warfarin PO 5mg  daily last dose was prior to admission. Pt presented with supratherapeutic INR. Now INR 2. Will restart Heparin ggt in AM. Risk benefit ratio has been examined closely.  4.  ChronicSystolic Dysfunction / Nonischemic Cardiomyopathy s/p ICD - Echo 11/06/2017 showed left ventricle with mildly to moderately reduced systolic function with EF of 40-45%; moderately dilated left atrium; mildly dilated right atrium; moderate aortic stenosis; mild mitral regurgitation; PASP mildly increased at 34 mmHg. Hold Toprol XL overnight given transient hypotension post procedure. As hemodynamic improve we will restart.  5. Urinary Retention > 500cc residual volume on bladder scan- indwelling foley catheter placed.  6. UTI- UA showed positive leukocytes on presentation and patient was receiving antibiotics as an outpatient. Now has an indwelling catheter. Started on Rocephin. Tren WBC, fever curve.  Disposition / Summary of Today's Plan 11/10/17   Admitted to ICU while sheath is in place post embolization Bed stays flat Start Heparin ggt after sheath removal    Diet: cardiac 2g sodium diet Pain/Anxiety/Delirium protocol (if indicated): prn  VAP protocol (if indicated): not indicated  DVT prophylaxis: SCDs in place GI prophylaxis: Protonix ggt Hyperglycemia protocol: if BG >180mg /dl will start ISS for goal BG 140-180mg /dl Mobility: Bedrest absolutely flat. No bending at sheath Code Status: FULL Family Communication: at bedside and updated of current clinical condition  Labs   CBC: Recent Labs  Lab 11/05/17 1518  11/06/17 1700 11/07/17 0517 11/08/17 0427 11/09/17 0343 11/10/17 0435 11/10/17 1630  WBC 7.4   < > 6.2 5.9 8.0 10.8* 13.4*  --    NEUTROABS 5.2  --   --   --   --   --   --   --   HGB 9.5*   < > 8.5* 8.6* 8.5* 8.4* 6.7* 7.6*  HCT 29.6*   < > 26.7* 26.8* 26.6* 26.5* 21.1* 23.3*  MCV 91.4   < > 92.7 93.4 93.0 94.0 93.8  --   PLT 101*   < > 141* 158 216 266 266  --    < > = values in this interval not displayed.    Basic Metabolic Panel: Recent Labs  Lab 11/05/17 1518 11/06/17 0318 11/07/17 0517 11/08/17 0427 11/09/17 0343 11/10/17 0435  NA 140 143 140 139 138 138  K 4.1 3.7 3.7 4.1 4.0 4.4  CL 110 111 108 107 109 109  CO2 21* 25 25 25 24 24   GLUCOSE 111* 109* 116* 99 119* 113*  BUN 47* 37* 22 15 12 19   CREATININE 1.20 1.17 1.08 1.02 1.03 1.04  CALCIUM 7.7* 7.9* 8.2* 8.2* 8.2* 7.8*  MG 2.0  --   --   --   --   --   PHOS 1.7*  --  3.1  --   --   --    GFR: Estimated Creatinine Clearance: 64.4 mL/min (by C-G formula based on SCr of 1.04 mg/dL). Recent Labs  Lab 11/07/17 0517 11/08/17 0427 11/09/17 0343 11/10/17 0435  WBC 5.9 8.0 10.8* 13.4*    Liver Function Tests: Recent Labs  Lab 11/05/17 1518 11/07/17 0517 11/08/17 0427  AST 94* 137* 107*  ALT 51* 79* 69*  ALKPHOS 61 67 67  BILITOT 0.9 1.0 1.2  PROT 5.1* 5.6* 5.6*  ALBUMIN 2.6* 2.9* 2.9*   No results for input(s): LIPASE, AMYLASE in the last 168 hours. No results for input(s): AMMONIA in the last 168 hours.  ABG    Component Value Date/Time   PHART 7.370 04/16/2012 1015   PCO2ART 40.3 04/16/2012 1015   PO2ART 71.0 (L) 04/16/2012 1015   HCO3 24.4 (H) 04/16/2012 1029   TCO2 26 04/16/2012 1029   ACIDBASEDEF 2.0 04/16/2012 1029   O2SAT 67.0 04/16/2012 1029     Coagulation Profile: Recent Labs  Lab 11/07/17 0517 11/08/17 0427 11/09/17 0343 11/10/17 0435 11/10/17 1752  INR 3.61 1.60 1.29 1.59 2.00    Cardiac Enzymes: No results for input(s): CKTOTAL, CKMB, CKMBINDEX, TROPONINI in the last 168 hours.  HbA1C: No results found for: HGBA1C  CBG: Recent Labs  Lab 11/10/17 2048  GLUCAP 111*    Admitting History of  Present Illness.    77 yr old male with PMHx of Aortic Aneurysm s/p Bentall procedure in 2002, with mechanical AVR, coronary artery disease nonobstructive in 2002, hypertension, hyperlipidemia, nonischemic cardiomyopathy  status post [facility] Jude CRT-D in 2014, and persistent atrial fibrillation on anticoagulation with warfarin (Initial INR supratherapuetic @ 9.7), HTN, h/o diverticulosis along with internal and external hemorrhoids seen on colonoscopy in October 2018 presented to [facility] complaining of abdominal pain located in lower quadrants accompanied by several days of melena and hematochexia, This began after he was prescribed an antibiotic for Rx UTI by his PCP.  He attempted to self medicate with baking soda which did not work. He reported one episode of hematemesis. When this started he stopped taking his coumadin 5 days prior to presentation. He denies other symptoms such as: acid reflux, regurgitation, dyshpagia, odynophagia, unintentional weight loss/gain and changes in appetite.   At [facility] ED he was found to be in a wide-complex tachycardia presumed to be ventricular tachycardia for which she was started on an amiodarone drip. This was discontinued as his blood pressure dropped to 80s. Patient also found to have hematochezia with an INR of 9.7. Hemoglobin was found to be 11.7. Patient was transferred to [facility] for GI and Cardiology mgmt. GI evaluation with EGD and colonoscopy on 9/25 showed multiple antral ulcers and 3 large duodenal bulb ulcers with pigmentation and colonoscopy showed diverticulosis and removal of tubular adenomas from transverse and sigmoid colon.  On 9/26 he received 10mg  Coumadin and started on heparin ggt as a restart to bridging therapy.  Overnight on 9/26 he He had several episodes of melena and by 9/27 AM he was hypotension secondary to hypovolemic shock from acute blood loss and was transfused and received a bleeding scan which demonstrated suspected  transient proximal small bowel bleed.--> POD 0 IR embolization of GDA  Review of Systems:   .Review of Systems  Constitutional: Negative.   HENT: Negative.   Cardiovascular: Negative.   Skin: Negative.      Past Medical History  He,  has a past medical history of AICD (automatic cardioverter/defibrillator) present (07/11/2012), Aortic aneurysm, thoracic ([place]), Aortic valve, bicuspid, Basal cell carcinoma of face, BPH (benign prostatic hypertrophy), CHF (congestive heart failure) ([place]), Coronary atherosclerosis of native coronary artery, Essential hypertension, benign, Left bundle branch block, Mixed hyperlipidemia, Nonischemic cardiomyopathy ([place]), Persistent atrial fibrillation ([place]), and Presence of permanent cardiac pacemaker.   Surgical History    Past Surgical History:  Procedure Laterality Date  . BENTALL PROCEDURE  2002   Dr Cyndia Bent   . BI-VENTRICULAR IMPLANTABLE CARDIOVERTER DEFIBRILLATOR N/A 07/12/2012   Procedure: BI-VENTRICULAR IMPLANTABLE CARDIOVERTER DEFIBRILLATOR  (CRT-D);  Surgeon: [name], MD;  Location: [facility] CATH LAB;  Service: Cardiovascular;  Laterality: N/A;  . BI-VENTRICULAR IMPLANTABLE CARDIOVERTER DEFIBRILLATOR  (CRT-D)  07/11/2012   [facility]- Dr. Rayann Heman  . CARDIAC CATHETERIZATION  2002  . CARDIAC VALVE REPLACEMENT     AVR  . CATARACT EXTRACTION Left 01/21/13  . COLONOSCOPY N/A 12/01/2016   Procedure: COLONOSCOPY;  Surgeon: Rogene Houston, MD;  Location: [facility] ENDO [facility];  Service: Endoscopy;  Laterality: N/A;  2:00  . COLONOSCOPY WITH PROPOFOL N/A 11/08/2017   Procedure: COLONOSCOPY WITH PROPOFOL;  Surgeon: Ronnette Juniper, MD;  Location: [place];  Service: Gastroenterology;  Laterality: N/A;  . ESOPHAGOGASTRODUODENOSCOPY (EGD) WITH PROPOFOL N/A 11/08/2017   Procedure: ESOPHAGOGASTRODUODENOSCOPY (EGD) WITH PROPOFOL Needs INR<2;  Surgeon: Ronnette Juniper, MD;  Location: [place];  Service: Gastroenterology;  Laterality: N/A;  . HERNIA  REPAIR    . POLYPECTOMY  11/08/2017   Procedure: POLYPECTOMY;  Surgeon: Ronnette Juniper, MD;  Location: [place];  Service: Gastroenterology;;  . TONSILLECTOMY AND  ADENOIDECTOMY  1949  . UMBILICAL HERNIA REPAIR  09/2017     Social History   Social History   Socioeconomic History  . Marital status: Widowed    Spouse name: Not on file  . Number of children: Not on file  . Years of education: Not on file  . Highest education level: Not on file  Occupational History  . Occupation: Charity fundraiser  Social Needs  . Financial resource strain: Not on file  . Food insecurity:    Worry: Not on file    Inability: Not on file  . Transportation needs:    Medical: Not on file    Non-medical: Not on file  Tobacco Use  . Smoking status: Former Smoker    Packs/day: 1.00    Years: 41.00    Pack years: 41.00    Types: Cigarettes    Last attempt to quit: 02/15/2000    Years since quitting: 17.7  . Smokeless tobacco: Former Systems developer    Types: Snuff, Chew  . Tobacco comment: 11/06/2017 "used chew and snuff in my teens"  Substance and Sexual Activity  . Alcohol use: Yes    Comment: 11/06/2017  "2-3 glasses of wine/month"  . Drug use: Never  . Sexual activity: Not Currently  Lifestyle  . Physical activity:    Days per week: Not on file    Minutes per session: Not on file  . Stress: Not on file  Relationships  . Social connections:    Talks on phone: Not on file    Gets together: Not on file    Attends religious service: Not on file    Active member of club or organization: Not on file    Attends meetings of clubs or organizations: Not on file    Relationship status: Not on file  . Intimate partner violence:    Fear of current or ex partner: Not on file    Emotionally abused: Not on file    Physically abused: Not on file    Forced sexual activity: Not on file  Other Topics Concern  . Not on file  Social History Narrative   Widow, Lives in [place]   Regular Exercise --yes   Retired  Charity fundraiser   Attends [employer]  ,  reports that he quit smoking about 17 years ago. His smoking use included cigarettes. He has a 41.00 pack-year smoking history. He has quit using smokeless tobacco.  His smokeless tobacco use included snuff and chew. He reports that he drinks alcohol. He reports that he does not use drugs.   Family History   His family history includes Coronary artery disease in his father.   Allergies No Known Allergies   Home Medications  Prior to Admission medications   Medication Sig Start Date End Date Taking? Authorizing Provider  acetaminophen (TYLENOL) 500 MG tablet Take 1,000 mg by mouth every 6 (six) hours as needed for headache (pain).   Yes [provider]  atorvastatin (LIPITOR) 10 MG tablet Take 10 mg by mouth daily.   Yes [provider]  cholecalciferol (VITAMIN D) 1000 units tablet Take 1,000 Units by mouth daily.   Yes [provider]  furosemide (LASIX) 20 MG tablet Take 20 mg by mouth daily.    Yes [provider]  losartan (COZAAR) 50 MG tablet Take 1 tablet (50 mg total) by mouth daily. Patient taking differently: Take 50 mg by mouth at bedtime.  04/23/13  Yes Satira Sark, MD  meclizine (ANTIVERT) 25 MG tablet Take 25 mg by mouth 3 (three) times daily as needed for dizziness.   Yes [provider]  metoprolol succinate (TOPROL-XL) 100 MG 24 hr tablet TAKE 1 AND 1/2 TABLETS BY MOUTH TWICE DAILY Patient taking differently: Take 150 mg by mouth 2 (two) times daily.  08/28/17  Yes Satira Sark, MD  metroNIDAZOLE (METROCREAM) 0.75 % cream Apply 1 application topically daily as needed (rosacea).   Yes [provider]  Omega-3 Fatty Acids (FISH OIL) 1000 MG CAPS Take 1,000 mg by mouth daily.    Yes [provider]  oxymetazoline (AFRIN) 0.05 % nasal spray Place 1 spray into both nostrils daily as needed for congestion.   Yes [provider]    phenylephrine (NEO-SYNEPHRINE) 1 % nasal spray Place 1 drop into both nostrils daily as needed for congestion.   Yes [provider]  sulfamethoxazole-trimethoprim (BACTRIM DS,SEPTRA DS) 800-160 MG tablet Take 1 tablet by mouth 2 (two) times daily. 3 day course completed on or about 11/02/17 - additional 14 day course filled 11/03/17 but not yet taken 11/03/17  Yes [provider]  Tamsulosin HCl (FLOMAX) 0.4 MG CAPS Take 0.4 mg by mouth at bedtime.    Yes [provider]  vitamin C (ASCORBIC ACID) 500 MG tablet Take 500 mg by mouth daily.   Yes [provider]  warfarin (COUMADIN) 5 MG tablet Take 5 mg by mouth at bedtime.    Yes [provider]

## 2017-11-10 NOTE — Progress Notes (Signed)
[place] for Heparin>>warfarin Indication: Mechanical AVR + Afib  No Known Allergies  Patient Measurements: Height: 5\' 8"  (172.7 cm) Weight: 195 lb 12.3 oz (88.8 kg) IBW/kg (Calculated) : 68.4 Heparin Dosing Weight: 86.4 kg  Vital Signs: Temp: 98.4 F (36.9 C) (09/27 0915) Temp Source: Oral (09/27 0915) BP: 98/62 (09/27 0915) Pulse Rate: 126 (09/27 0915)  Labs: Recent Labs    11/08/17 0427 11/08/17 1849 11/09/17 0343 11/10/17 0435  HGB 8.5*  --  8.4* 6.7*  HCT 26.6*  --  26.5* 21.1*  PLT 216  --  266 266  LABPROT 18.9*  --  16.0* 18.8*  INR 1.60  --  1.29 1.59  HEPARINUNFRC  --  0.33 0.44 0.43  CREATININE 1.02  --  1.03 1.04    Estimated Creatinine Clearance: 64.4 mL/min (by C-G formula based on SCr of 1.04 mg/dL).   Medical History: Past Medical History:  Diagnosis Date  . AICD (automatic cardioverter/defibrillator) present 07/11/2012  . Aortic aneurysm, thoracic ([place])    Fusiform s/p Bentall procedure 2002  . Aortic valve, bicuspid    Mechanical AVR  . Basal cell carcinoma of face    "burned off" (11/06/2017)  . BPH (benign prostatic hypertrophy)   . CHF (congestive heart failure) ([place])    "once" (11/06/2017)  . Coronary atherosclerosis of native coronary artery    Nonobstructive at cardiac catherization 2002  . Essential hypertension, benign   . Left bundle branch block   . Mixed hyperlipidemia   . Nonischemic cardiomyopathy ([facility])    CRT-D  . Persistent atrial fibrillation ([place])   . Presence of permanent cardiac pacemaker     Assessment: 49 YOF who presented on 9/22 with ICD shocks and concern for lower GIB. The patient was on warfarin PTA for hx mech AVR + Afib. Admit INR elevated at 8.35 in the setting of recent outpatient Bactrim on PTA dosing of 5 mg daily.   Patient now s/p EGD and colonoscopy 9/25. Two polyps removed scattered diverticula noted in colon. PPI to continue, heparin restarted post procedure.   -recurrent GIB noted overnight (hg8.4>> 6.7) and melanotic stools reported -heparin level= 0.43, INR up to 1.59   Goal of Therapy:  Heparin goal 0.3-0.7 INR goal 2.5-3.5 Monitor platelets by anticoagulation protocol: Yes   Plan:  -No heparin changes now -Will hold coumadin for now -Daily PT/INR, heparin level and CBC  Hildred Laser, PharmD Clinical Pharmacist Please check Amion for pharmacy contact number

## 2017-11-10 NOTE — Progress Notes (Signed)
Lab at bedside at this time.  

## 2017-11-10 NOTE — Progress Notes (Addendum)
PROGRESS NOTE    Timothy ROTHERMEL Sr.  QPR:[id number] DOB: 05-01-1940 DOA: 11/05/2017 PCP: [name], MD      Brief Narrative:  Mr. Timothy Fritz is a 77 y.o. M with NICM with ICD, EF 40%, mech AVR and AF on warfarin, and HTN who presented with several days melena, then abdominal pain and hematemesis.  Hb was 9.5 on admission 9/22. He received 2u prbc's on 9/22.  EGD 9/25 showed 3 large nonbleeding ulcers w/ discoloration at the bases, colonoscopy 9/25 showed 2 polyps which were removed. Coumadin was held , INR dropped from 3.7 on admission to 1.2 on 9/26.  IV heparin and po coumadin were resumed on 9/25 post procedure.     Subjective: Had new maroon stools overnight, lightheaded w/ BP drop into 70's and Hb drop 8.5 > 6.7 this am.  Getting 1st of 2 units prbc's now.   Objective: Vitals:   11/10/17 0910 11/10/17 0915 11/10/17 0940 11/10/17 1041  BP: 97/62 98/62 (!) 77/54 100/60  Pulse:  (!) 126    Resp: (!) 24 18 18  (!) 22  Temp:  98.4 F (36.9 C)    TempSrc:  Oral    SpO2:  99%    Weight:      Height:        Intake/Output Summary (Last 24 hours) at 11/10/2017 1055 Last data filed at 11/10/2017 0900 Gross per 24 hour  Intake 1061.93 ml  Output -  Net 1061.93 ml   Filed Weights   11/08/17 0700 11/09/17 0501 11/10/17 0607  Weight: 88.5 kg 89 kg 88.8 kg    Examination: General appearance:   Adult male, lying in bed, no acute distress, eating lunch. Skin: Skin warm and dry, no jaundice, no suspicious rashes or lesions. Cardiac: Irregularly irregular, normal rate.  No murmurs appreciated.  Mechanical S2.  Normal JVP.  No lower extremity edema. Respiratory: Normal respiratory rate and rhythm, lungs clear without rales or wheezes. Abdomen: Abdomen soft without tenderness to palpation, no ascites or distention.   MSK: No deformities or effusions. Neuro: Awake and alert, extraocular movements intact, moves all extremities with normal coordination and strength.       Assessment &  Plan:  Upper GI bleed Upper and lower endoscopy on 9/25.  There were several gastric and duodenal ulcers, large, but without stigmata of high risk for rebleeding. - pt bleeding again overnight (maroon stool, BP drop, Hb drop), have d/w GI they recommend to hold coumadin and IV heparin for now, they will see today; I have d/w pharm about holding hep and coumadin  - serial H/H q 6 hrs for now  - post EGD/ colon 9/25 GI rec's as below:  - continue pantoprazole twice daily  - will need 2 months twice daily pantoprazole, then daily indefinitely while on warfarin  -We will need [facility] follow-up at discharge  -Follow-up H. pylori     Atrial fibrillation with RVR, paroxysmal CHA2DS2-Vasc 4 for age, HTN, CHF.  Complicated by AVR, CHF, age. -Continue heparin gtt -Insurance cover only once daily Lovenox, which would be suboptimal, may need full heparin bridge to INR 2.5  Acute blood loss anemia - Hb 9.5 on admission, got 2u prbc's on 9/22 and Hb stabilized in the mid 8's - had EGD/ colon on 9/25, see above - this am Hgb worse , pt bleeding again (see above), getting 2u PRBC's this am and starting to feel better -Transfusion threshold 8 g/dL -Continue iron  Multiple ICD shocks  Nonischemic cardiomyopathy  Mechanical aortic valve Goal INR 2.5-3.5 given EF and AF (J[facility] 2017).  EF 40-45%.  Rates better controlled today with higher dose of metoprolol. -Continue atorvastatin, metoprolol    Recent umbilical hernia repair  Other medications -Continue iron  Transaminitis Asymptomatic.  Unclear chronicity.  Patient denies history of abnormal liver tests.  -Follow up with PCP      Kelly Splinter MD [facility] pgr [phone] 07/09/2017, 9:25 AM   DVT prophylaxis: heparin to be held, will order SCD's  Code Status: FULL Family Communication: None present Disposition Plan: none at present    Consultants:   Cardiology  Gastroenterology  Procedures:   Echocardiogram  9/23 LV EF: 40% -   45%  ------------------------------------------------------------------- History:   PMH:  Chronic systolic heart failure.  Atrial fibrillation.  Risk factors:  ICD in place. Mechanical aortic valve replacement. Hypertension.  ------------------------------------------------------------------- Study Conclusions  - Left ventricle: The cavity size was normal. Systolic function was   mildly to moderately reduced. The estimated ejection fraction was   in the range of 40% to 45%. - Aortic valve: Mildly calcified annulus. Moderately thickened,   moderately calcified leaflets. There was moderate stenosis. Valve   area (VTI): 1.59 cm^2. Valve area (Vmax): 1.61 cm^2. Valve area   (Vmean): 1.34 cm^2. - Mitral valve: There was mild regurgitation. - Left atrium: The atrium was moderately dilated. - Right atrium: The atrium was mildly dilated. - Pulmonary arteries: Systolic pressure was mildly increased. PA   peak pressure: 34 mm Hg (S).   EGD 9/25  Colonoscopy 9/25 EGD was performed for hematemesis.  Findings: Normal esophagus, regular Z line at 40 cm from incisors. 3 nonbleeding slightly cratered but mostly superficial gastric ulcers in the antrum, largest 8 mm in dimension. Cardiac fundus normal on retroflexion. 3 large nonbleeding superficial duodenal ulcers with pigmented material found in the duodenal bulb and first portion of duodenum. Largest ulcer was 2 cm 3 cm in size. Not amenable for endoscopic treatment due to their is large size, not bleeding at present.  Colonoscopy was performed for hematochezia. Findings: A 6 mm polyp noted to transverse colon, removed via hot snare polypectomy. A 9 mm polyp noted in rectum, removed via hot snare polypectomy. Several scattered small and large mouth diverticula noted in sigmoid and descending colon. The rest of the colon and retroflexion was unremarkable   Recommendations: PPI twice a day for 2 months, then  indefinitely while patient is on Coumadin. H pylori stool antigen and treat if positive. Okay to resume regular diet. Patient to be started on heparin drip today, okay to transition to Coumadin, however remains at increased risk of bleeding due to multiple large ulcers noted in duodenal bulb.     Antimicrobials:   None       Data Reviewed: I have personally reviewed following labs and imaging studies:  CBC: Recent Labs  Lab 11/05/17 1518  11/06/17 1700 11/07/17 0517 11/08/17 0427 11/09/17 0343 11/10/17 0435  WBC 7.4   < > 6.2 5.9 8.0 10.8* 13.4*  NEUTROABS 5.2  --   --   --   --   --   --   HGB 9.5*   < > 8.5* 8.6* 8.5* 8.4* 6.7*  HCT 29.6*   < > 26.7* 26.8* 26.6* 26.5* 21.1*  MCV 91.4   < > 92.7 93.4 93.0 94.0 93.8  PLT 101*   < > 141* 158 216 266 266   < > = values in this interval not displayed.  Basic Metabolic Panel: Recent Labs  Lab 11/05/17 1518 11/06/17 0318 11/07/17 0517 11/08/17 0427 11/09/17 0343 11/10/17 0435  NA 140 143 140 139 138 138  K 4.1 3.7 3.7 4.1 4.0 4.4  CL 110 111 108 107 109 109  CO2 21* 25 25 25 24 24   GLUCOSE 111* 109* 116* 99 119* 113*  BUN 47* 37* 22 15 12 19   CREATININE 1.20 1.17 1.08 1.02 1.03 1.04  CALCIUM 7.7* 7.9* 8.2* 8.2* 8.2* 7.8*  MG 2.0  --   --   --   --   --   PHOS 1.7*  --  3.1  --   --   --    GFR: Estimated Creatinine Clearance: 64.4 mL/min (by C-G formula based on SCr of 1.04 mg/dL). Liver Function Tests: Recent Labs  Lab 11/05/17 1518 11/07/17 0517 11/08/17 0427  AST 94* 137* 107*  ALT 51* 79* 69*  ALKPHOS 61 67 67  BILITOT 0.9 1.0 1.2  PROT 5.1* 5.6* 5.6*  ALBUMIN 2.6* 2.9* 2.9*   No results for input(s): LIPASE, AMYLASE in the last 168 hours. No results for input(s): AMMONIA in the last 168 hours. Coagulation Profile: Recent Labs  Lab 11/06/17 0318 11/07/17 0517 11/08/17 0427 11/09/17 0343 11/10/17 0435  INR 3.70 3.61 1.60 1.29 1.59     Scheduled Meds: . sodium chloride   Intravenous  Once  . atorvastatin  10 mg Oral q1800  . cholecalciferol  1,000 Units Oral Daily  . ferrous sulfate  325 mg Oral QODAY  . metoprolol succinate  100 mg Oral BID  . omega-3 acid ethyl esters  1,000 mg Oral Daily  . pantoprazole  40 mg Oral BID  . tamsulosin  0.4 mg Oral QHS  . vitamin C  500 mg Oral Daily  . Warfarin - Pharmacist Dosing Inpatient   Does not apply q1800   Continuous Infusions: . sodium chloride Stopped (11/07/17 2055)  . heparin 1,300 Units/hr (11/10/17 0700)     LOS: 5 days

## 2017-11-11 ENCOUNTER — Encounter (HOSPITAL_COMMUNITY): Payer: Self-pay | Admitting: Interventional Radiology

## 2017-11-11 LAB — PROTIME-INR
INR: 2.16
PROTHROMBIN TIME: 23.9 s — AB (ref 11.4–15.2)

## 2017-11-11 LAB — CBC
HCT: 25.1 % — ABNORMAL LOW (ref 39.0–52.0)
Hemoglobin: 8.1 g/dL — ABNORMAL LOW (ref 13.0–17.0)
MCH: 29.5 pg (ref 26.0–34.0)
MCHC: 32.3 g/dL (ref 30.0–36.0)
MCV: 91.3 fL (ref 78.0–100.0)
PLATELETS: 229 10*3/uL (ref 150–400)
RBC: 2.75 MIL/uL — ABNORMAL LOW (ref 4.22–5.81)
RDW: 15.9 % — ABNORMAL HIGH (ref 11.5–15.5)
WBC: 12.6 10*3/uL — AB (ref 4.0–10.5)

## 2017-11-11 LAB — BASIC METABOLIC PANEL
ANION GAP: 6 (ref 5–15)
BUN: 18 mg/dL (ref 8–23)
CO2: 20 mmol/L — ABNORMAL LOW (ref 22–32)
Calcium: 7.5 mg/dL — ABNORMAL LOW (ref 8.9–10.3)
Chloride: 110 mmol/L (ref 98–111)
Creatinine, Ser: 0.97 mg/dL (ref 0.61–1.24)
Glucose, Bld: 105 mg/dL — ABNORMAL HIGH (ref 70–99)
Potassium: 4 mmol/L (ref 3.5–5.1)
Sodium: 136 mmol/L (ref 135–145)

## 2017-11-11 LAB — HEMOGLOBIN AND HEMATOCRIT, BLOOD
HCT: 23.4 % — ABNORMAL LOW (ref 39.0–52.0)
Hemoglobin: 7.7 g/dL — ABNORMAL LOW (ref 13.0–17.0)

## 2017-11-11 MED ORDER — FENTANYL CITRATE (PF) 100 MCG/2ML IJ SOLN: 100.0000 ug | Freq: Once | INTRAMUSCULAR | Status: DC

## 2017-11-11 MED ORDER — MIDAZOLAM HCL 2 MG/2ML IJ SOLN: 4.0000 mg | Freq: Once | INTRAMUSCULAR | Status: DC

## 2017-11-11 MED ORDER — VITAMIN K1 10 MG/ML IJ SOLN
5.0000 mg | Freq: Once | INTRAVENOUS | Status: AC
  Administered 2017-11-11: 5 mg via INTRAVENOUS
  Filled 2017-11-11: qty 0.5

## 2017-11-11 MED ORDER — ROCURONIUM BROMIDE 50 MG/5ML IV SOLN
100.0000 mg | Freq: Once | INTRAVENOUS | Status: DC
  Filled 2017-11-11: qty 10

## 2017-11-11 NOTE — Procedures (Deleted)
Intubation Procedure Note Timothy Fritz [id number] 13-Aug-1940  Procedure: Intubation Indications: Respiratory insufficiency  Procedure Details Consent: Unable to obtain consent because of altered level of consciousness. Time Out: Verified patient identification, verified procedure, site/side was marked, verified correct patient position, special equipment/implants available, medications/allergies/relevent history reviewed, required imaging and test results available.  Performed  Maximum sterile technique was used including gloves, hand hygiene and mask.  MAC and 4    Evaluation Hemodynamic Status: BP stable throughout; O2 sats: stable throughout Patient's Current Condition: stable Complications: No apparent complications Patient did tolerate procedure well. Chest X-ray ordered to verify placement.  CXR: pending.   Timothy Fritz Timothy Fritz 11/11/2017

## 2017-11-11 NOTE — Progress Notes (Signed)
Progress Note  Patient Name: Timothy Fritz. Date of Encounter: 11/11/2017  Primary Cardiologist: Rozann Lesches, MD   Subjective   No CP or dyspnea  Inpatient Medications    Scheduled Meds: . sodium chloride   Intravenous Once  . sodium chloride   Intravenous Once  . sodium chloride   Intravenous Once  . atorvastatin  10 mg Oral q1800  . cholecalciferol  1,000 Units Oral Daily  . ferrous sulfate  325 mg Oral QODAY  . metoprolol succinate  100 mg Oral BID  . omega-3 acid ethyl esters  1,000 mg Oral Daily  . [START ON 11/14/2017] pantoprazole  40 mg Intravenous Q12H  . tamsulosin  0.4 mg Oral QHS  . vitamin C  500 mg Oral Daily  . Warfarin - Pharmacist Dosing Inpatient   Does not apply q1800   Continuous Infusions: . sodium chloride 10 mL/hr at 11/11/17 0800  . cefTRIAXone (ROCEPHIN)  IV 1 g (11/10/17 2314)  . heparin    . pantoprozole (PROTONIX) infusion 8 mg/hr (11/11/17 0800)   PRN Meds: sodium chloride, acetaminophen, meclizine, senna-docusate, traMADol   Vital Signs    Vitals:   11/11/17 0600 11/11/17 0700 11/11/17 0800 11/11/17 0807  BP: (!) 113/58 98/68 107/73   Pulse: 94 87 83   Resp: 19 19 19    Temp:    98.1 F (36.7 C)  TempSrc:    Oral  SpO2: 97% 98% 97%   Weight:      Height:        Intake/Output Summary (Last 24 hours) at 11/11/2017 0946 Last data filed at 11/11/2017 0840 Gross per 24 hour  Intake 2026.13 ml  Output 1635 ml  Net 391.13 ml   Filed Weights   11/10/17 0607 11/10/17 2100 11/11/17 0341  Weight: 88.8 kg 90.5 kg 89.6 kg    Telemetry    Atrial fibrillation with intermittent pacing; rate controlled - Personally Reviewed  Physical Exam   GEN: No acute distress.   Neck: No JVD Cardiac: irregular; crisp mechanical valve sound Respiratory: Clear to auscultation bilaterally. GI: Soft, nontender, non-distended  MS: trace edema Neuro:  Nonfocal  Psych: Normal affect   Labs    Chemistry Recent Labs  Lab 11/05/17 1518   11/07/17 0517 11/08/17 0427 11/09/17 0343 11/10/17 0435 11/11/17 0337  NA 140   < > 140 139 138 138 136  K 4.1   < > 3.7 4.1 4.0 4.4 4.0  CL 110   < > 108 107 109 109 110  CO2 21*   < > 25 25 24 24  20*  GLUCOSE 111*   < > 116* 99 119* 113* 105*  BUN 47*   < > 22 15 12 19 18   CREATININE 1.20   < > 1.08 1.02 1.03 1.04 0.97  CALCIUM 7.7*   < > 8.2* 8.2* 8.2* 7.8* 7.5*  PROT 5.1*  --  5.6* 5.6*  --   --   --   ALBUMIN 2.6*  --  2.9* 2.9*  --   --   --   AST 94*  --  137* 107*  --   --   --   ALT 51*  --  79* 69*  --   --   --   ALKPHOS 61  --  67 67  --   --   --   BILITOT 0.9  --  1.0 1.2  --   --   --   GFRNONAA 57*   < > >  60 >60 >60 >60 >60  GFRAA >60   < > >60 >60 >60 >60 >60  ANIONGAP 9   < > 7 7 5 5 6    < > = values in this interval not displayed.     Hematology Recent Labs  Lab 11/09/17 0343 11/10/17 0435 11/10/17 1630 11/10/17 2316 11/11/17 0337  WBC 10.8* 13.4*  --   --  12.6*  RBC 2.82* 2.25*  --   --  2.75*  HGB 8.4* 6.7* 7.6* 8.2* 8.1*  HCT 26.5* 21.1* 23.3* 25.0* 25.1*  MCV 94.0 93.8  --   --  91.3  MCH 29.8 29.8  --   --  29.5  MCHC 31.7 31.8  --   --  32.3  RDW 13.6 13.9  --   --  15.9*  PLT 266 266  --   --  229    Radiology    Nm Gi Blood Loss  Result Date: 11/10/2017 CLINICAL DATA:  Melena/hematochezia this morning. Recent endoscopy demonstrated antral ulcers and 3 large duodenal bulbs ulcers. Patient is on anticoagulation for mechanical valve and atrial fibrillation. EXAM: NUCLEAR MEDICINE GASTROINTESTINAL BLEEDING SCAN TECHNIQUE: Sequential abdominal images were obtained following intravenous administration of Tc-14m labeled red blood cells. RADIOPHARMACEUTICALS:  26.3 mCi Tc-102m pertechnetate in-vitro labeled red cells. COMPARISON:  None. FINDINGS: There is a small focus abnormal radiotracer activity which appears to which to originate within the proximal small bowel. The overall amount of abnormal radiotracer activity is very small suggesting a  small, transient bleed. Physiologic activity is seen within the heart, liver, urinary bladder, abdominal aorta and pelvic vasculature. IMPRESSION: Suspected transient proximal small bowel bleed. Critical Value/emergent results were called by telephone at the time of interpretation on 11/10/2017 at 1636 to Dr. Ronnette Juniper , who verbally acknowledged these results. Electronically Signed   By: Sandi Mariscal M.D.   On: 11/10/2017 16:35    Patient Profile     RANARD HARTE Fritz.is a 77 y.o.malewith a hx of aortic aneurysm status post Bentall procedure in 2002 with mechanical AVR, coronary artery disease nonobstructive in 2002, hypertension, hyperlipidemia, nonischemic cardiomyopathy status post [employer] Jude CRT-D in 2014, and persistent atrial fibrillationwho was admitted after multipleICD shocks, due to rapid afib.  Patient found to have a GI bleed.  He was found to have duodenal ulcers, polyps and diverticulosis with GI evaluation.  Recurrent bleeding and nuclear scan showed bleeding from proximal small bowel versus duodenum.  Had empiric embolization of GDA. Last echocardiogram September 2019 showed ejection fraction 40 to 45%, became to call aortic valve with mean gradient 15 mmHg, mild mitral regurgitation and biatrial enlargement.  Assessment & Plan   1 GI bleed-hemoglobin is 8.1 following transfusion yesterday.  He is now status post empiric embolization of GDA.  Continue Protonix.  Follow hemoglobin.  2 status post aortic valve replacement-I discussed case with Dr Therisa Doyne. Given recent embolization of GDA, risk of rebleed is low. Will resume heparin following sheath removal; would hold on coumadin until clear there is no active bleeding and Hgb stable.  Note patient received vitamin K for elevated INR.  3 permanent atrial fibrillation-patient's heart rate is much better controlled.  Continue present dose of metoprolol.  4 prior ICD shocks-this is felt secondary to atrial fibrillation with rapid  ventricular response.  This occurred in the setting of ongoing bleeding/anemia.  No recurrent episodes.  5 chronic combined systolic/diastolic congestive heart failure-patient is euvolemic on examination.  We will continue to hold diuretics for now.  Follow exam closely for volume excess.  Note he is n.p.o. following his recent GI procedures.  For questions or updates, please contact [place] Please consult [email or web address] for contact info under        Signed, Kirk Ruths, MD  11/11/2017, 9:46 AM

## 2017-11-11 NOTE — Progress Notes (Signed)
Referring Physician(s): Dr. Therisa Doyne  Supervising Physician: Marybelle Killings  Patient Status:  [facility] - In-pt  Chief Complaint: GI bleed  Subjective: Lying flat.  Sheath remains in place.  Vitamin K complete.  INR this AM 2.16  Allergies: Patient has no known allergies.  Medications: Prior to Admission medications   Medication Sig Start Date End Date Taking? Authorizing Provider  acetaminophen (TYLENOL) 500 MG tablet Take 1,000 mg by mouth every 6 (six) hours as needed for headache (pain).   Yes [provider]  atorvastatin (LIPITOR) 10 MG tablet Take 10 mg by mouth daily.   Yes [provider]  cholecalciferol (VITAMIN D) 1000 units tablet Take 1,000 Units by mouth daily.   Yes [provider]  furosemide (LASIX) 20 MG tablet Take 20 mg by mouth daily.    Yes [provider]  losartan (COZAAR) 50 MG tablet Take 1 tablet (50 mg total) by mouth daily. Patient taking differently: Take 50 mg by mouth at bedtime.  04/23/13  Yes Satira Sark, MD  meclizine (ANTIVERT) 25 MG tablet Take 25 mg by mouth 3 (three) times daily as needed for dizziness.   Yes [provider]  metoprolol succinate (TOPROL-XL) 100 MG 24 hr tablet TAKE 1 AND 1/2 TABLETS BY MOUTH TWICE DAILY Patient taking differently: Take 150 mg by mouth 2 (two) times daily.  08/28/17  Yes Satira Sark, MD  metroNIDAZOLE (METROCREAM) 0.75 % cream Apply 1 application topically daily as needed (rosacea).   Yes [provider]  Omega-3 Fatty Acids (FISH OIL) 1000 MG CAPS Take 1,000 mg by mouth daily.    Yes [provider]  oxymetazoline (AFRIN) 0.05 % nasal spray Place 1 spray into both nostrils daily as needed for congestion.   Yes [provider]  phenylephrine (NEO-SYNEPHRINE) 1 % nasal spray Place 1 drop into both nostrils daily as needed for congestion.   Yes [provider]  sulfamethoxazole-trimethoprim (BACTRIM DS,SEPTRA DS) 800-160 MG  tablet Take 1 tablet by mouth 2 (two) times daily. 3 day course completed on or about 11/02/17 - additional 14 day course filled 11/03/17 but not yet taken 11/03/17  Yes [provider]  Tamsulosin HCl (FLOMAX) 0.4 MG CAPS Take 0.4 mg by mouth at bedtime.    Yes [provider]  vitamin C (ASCORBIC ACID) 500 MG tablet Take 500 mg by mouth daily.   Yes [provider]  warfarin (COUMADIN) 5 MG tablet Take 5 mg by mouth at bedtime.    Yes [provider]     Vital Signs: BP (!) 111/59   Pulse 81   Temp 98.3 F (36.8 C) (Oral)   Resp 20   Ht 5\' 8"  (1.727 m)   Wt 197 lb 8.5 oz (89.6 kg)   SpO2 96%   BMI 30.03 kg/m   Physical Exam  NAD, alert, lying flat Abdomen: Soft, non-distended, non-tender Groin: Sheath remains in place, no evidence of hematoma or pseudoaneurysm  Imaging: Nm Gi Blood Loss  Result Date: 11/10/2017 CLINICAL DATA:  Melena/hematochezia this morning. Recent endoscopy demonstrated antral ulcers and 3 large duodenal bulbs ulcers. Patient is on anticoagulation for mechanical valve and atrial fibrillation. EXAM: NUCLEAR MEDICINE GASTROINTESTINAL BLEEDING SCAN TECHNIQUE: Sequential abdominal images were obtained following intravenous administration of Tc-13m labeled red blood cells. RADIOPHARMACEUTICALS:  26.3 mCi Tc-75m pertechnetate in-vitro labeled red cells. COMPARISON:  None. FINDINGS: There is a small focus abnormal radiotracer activity which appears to which to originate within the proximal  small bowel. The overall amount of abnormal radiotracer activity is very small suggesting a small, transient bleed. Physiologic activity is seen within the heart, liver, urinary bladder, abdominal aorta and pelvic vasculature. IMPRESSION: Suspected transient proximal small bowel bleed. Critical Value/emergent results were called by telephone at the time of interpretation on 11/10/2017 at 1636 to Dr. Ronnette Juniper , who verbally acknowledged these results.  Electronically Signed   By: Sandi Mariscal M.D.   On: 11/10/2017 16:35    Labs:  CBC: Recent Labs    11/08/17 0427 11/09/17 0343 11/10/17 0435 11/10/17 1630 11/10/17 2316 11/11/17 0337  WBC 8.0 10.8* 13.4*  --   --  12.6*  HGB 8.5* 8.4* 6.7* 7.6* 8.2* 8.1*  HCT 26.6* 26.5* 21.1* 23.3* 25.0* 25.1*  PLT 216 266 266  --   --  229    COAGS: Recent Labs    11/09/17 0343 11/10/17 0435 11/10/17 1752 11/11/17 0337  INR 1.29 1.59 2.00 2.16    BMP: Recent Labs    11/08/17 0427 11/09/17 0343 11/10/17 0435 11/11/17 0337  NA 139 138 138 136  K 4.1 4.0 4.4 4.0  CL 107 109 109 110  CO2 25 24 24  20*  GLUCOSE 99 119* 113* 105*  BUN 15 12 19 18   CALCIUM 8.2* 8.2* 7.8* 7.5*  CREATININE 1.02 1.03 1.04 0.97  GFRNONAA >60 >60 >60 >60  GFRAA >60 >60 >60 >60    LIVER FUNCTION TESTS: Recent Labs    11/05/17 1518 11/07/17 0517 11/08/17 0427  BILITOT 0.9 1.0 1.2  AST 94* 137* 107*  ALT 51* 79* 69*  ALKPHOS 61 67 67  PROT 5.1* 5.6* 5.6*  ALBUMIN 2.6* 2.9* 2.9*    Assessment and Plan: GI bleed s/p empiric embolization of the GDA with Dr. Barbie Banner 11/10/17 Patient with elevated INR; 2.16 this AM.  Sheath remains until INR improved.  Not currently on heparin.  Vitamin K infusion nearly complete.  Discussed with Dr. Barbie Banner who will conference with medical team regarding heparin and sheath removal.  Groin stable; no evidence of hematoma or pseudoaneurysm.  Repeat INR ordered for tomorrow.  Electronically Signed: Docia Barrier, PA 11/11/2017, 2:18 PM   I spent a total of 15 Minutes at the the patient's bedside AND on the patient's hospital floor or unit, greater than 50% of which was counseling/coordinating care for GI bleed.

## 2017-11-11 NOTE — Progress Notes (Signed)
[facility] for Heparin (on hold) Indication: Mechanical AVR + Afib  No Known Allergies  Patient Measurements: Height: 5\' 8"  (172.7 cm) Weight: 197 lb 8.5 oz (89.6 kg) IBW/kg (Calculated) : 68.4 Heparin Dosing Weight: 86.4 kg  Vital Signs: Temp: 98.3 F (36.8 C) (09/28 1150) Temp Source: Oral (09/28 1150) BP: 111/59 (09/28 1100) Pulse Rate: 81 (09/28 1100)  Labs: Recent Labs    11/08/17 1849  11/09/17 0343 11/10/17 0435 11/10/17 1630 11/10/17 1752 11/10/17 2316 11/11/17 0337  HGB  --    < > 8.4* 6.7* 7.6*  --  8.2* 8.1*  HCT  --    < > 26.5* 21.1* 23.3*  --  25.0* 25.1*  PLT  --   --  266 266  --   --   --  229  LABPROT  --    < > 16.0* 18.8*  --  22.5*  --  23.9*  INR  --    < > 1.29 1.59  --  2.00  --  2.16  HEPARINUNFRC 0.33  --  0.44 0.43  --   --   --   --   CREATININE  --   --  1.03 1.04  --   --   --  0.97   < > = values in this interval not displayed.    Estimated Creatinine Clearance: 69.4 mL/min (by C-G formula based on SCr of 0.97 mg/dL).   Medical History: Past Medical History:  Diagnosis Date  . AICD (automatic cardioverter/defibrillator) present 07/11/2012  . Aortic aneurysm, thoracic ([place])    Fusiform s/p Bentall procedure 2002  . Aortic valve, bicuspid    Mechanical AVR  . Basal cell carcinoma of face    "burned off" (11/06/2017)  . BPH (benign prostatic hypertrophy)   . CHF (congestive heart failure) ([place])    "once" (11/06/2017)  . Coronary atherosclerosis of native coronary artery    Nonobstructive at cardiac catherization 2002  . Essential hypertension, benign   . Left bundle branch block   . Mixed hyperlipidemia   . Nonischemic cardiomyopathy ([facility])    CRT-D  . Persistent atrial fibrillation ([place])   . Presence of permanent cardiac pacemaker     Assessment: Timothy Fritz who presented on 9/22 with ICD shocks and concern for lower GIB. The patient was on warfarin PTA for hx mech AVR + Afib. Admit INR  elevated at 8.35 in the setting of recent outpatient Bactrim on PTA dosing of 5 mg daily.   S/p embolization of GDA in IR 9/27 PM. Anticoagulation is now on hold in the setting of GI bleed. Team aware of risk but given bleeding, unable to resume anticoagulation. CBC noted.    Goal of Therapy:  Heparin goal 0.3-0.5 INR goal 2.5-3.5 Monitor platelets by anticoagulation protocol: Yes   Plan:  Hold heparin until after sheath is pulled   Timothy Fritz, PharmD., BCPS Clinical Pharmacist Clinical phone for 11/11/17 until 8:30pm: [phone] If after 3:30pm, please refer to [facility] for unit-specific pharmacist

## 2017-11-11 NOTE — Progress Notes (Addendum)
PROGRESS NOTE    RUHAN BORAK Sr.  PIR:[id number] DOB: 06/05/1940 DOA: 11/05/2017 PCP: [name], MD      Brief Narrative:  Mr. Remmel is a 77 y.o. M with NICM with ICD, EF 40%, mech AVR and AF on warfarin, and HTN who presented with several days melena, then abdominal pain and hematemesis.  Hb was 9.5 on admission 9/22. He received 2u prbc's on 9/22.  EGD 9/25 showed 3 large nonbleeding ulcers w/ discoloration at the bases, colonoscopy 9/25 showed 2 polyps which were removed. Coumadin was held , INR dropped from 3.7 on admission to 1.2 on 9/26.  IV heparin and po coumadin were resumed on 9/25 post procedure.     Subjective: stable this am, Hb 8.1, no c/o's  Objective: Vitals:   11/10/17 0910 11/10/17 0915 11/10/17 0940 11/10/17 1041  BP: 97/62 98/62 (!) 77/54 100/60  Pulse:  (!) 126    Resp: (!) 24 18 18  (!) 22  Temp:  98.4 F (36.9 C)    TempSrc:  Oral    SpO2:  99%    Weight:      Height:        Intake/Output Summary (Last 24 hours) at 11/10/2017 1055 Last data filed at 11/10/2017 0900 Gross per 24 hour  Intake 1061.93 ml  Output -  Net 1061.93 ml   Filed Weights   11/08/17 0700 11/09/17 0501 11/10/17 0607  Weight: 88.5 kg 89 kg 88.8 kg    Examination: General appearance:   Adult male, lying in bed, no acute distress, eating lunch. Skin: Skin warm and dry, no jaundice, no suspicious rashes or lesions. Cardiac: Irregularly irregular, normal rate.  No murmurs appreciated.  Mechanical S2.  Normal JVP.  No lower extremity edema. Respiratory: Normal respiratory rate and rhythm, lungs clear without rales or wheezes. Abdomen: Abdomen soft without tenderness to palpation, no ascites or distention.  Foley in place.  R groin sheath in place.  MSK: No deformities or effusions. No LE edema Neuro: Awake and alert, extraocular movements intact, moves all extremities with normal coordination and strength.       Assessment & Plan:  Upper GI bleed - 1st bleed > on  admission, rec'd 2u FFP 9/22 for ^INR 8.3 . Upper and lower endoscopy on 9/25 showed several large duodenal ulcers nonbleeding but pigmented at the bases. 2 colon polyps were removed.  - 2nd bleed > 9/26 overnight maroon stools/ ^HR/ low BP's, after restarting IV heparin for valve. Bleeding scan localized to prox small bowel.  Went for embolization of gastroduodenal artery done successfully 9/27.  Got 3 units prbc's yesterday. Hb stable this am and vitals have stabilized as well.  Hb 8.1 Per IR/ GI   Atrial fibrillation with RVR, paroxysmal CHA2DS2-Vasc 4 for age, HTN, CHF.  Complicated by AVR, CHF, age. - per cardiology to resume IV heparin when the sheath has been removed -- got IV vit K 5mg  this am for INR correction per IR (2.1 this am) -- on coumadin long-term, pharm managing  Acute blood loss anemia - Hb 9.9 on admisison 9/22; lowest Hb 6.7 on 9/27,  stable 8.1 today, SP 3 unit prbc's yesterday (3 units total this admission) - cont po iron qod  Multiple ICD shocks - per cardiology these were likely due to afib/ RVR prior to admission, w/ rapid afib secondary to GIB/ anemia/ hypotension etc.   Nonischemic cardiomyopathy Mechanical aortic valve Goal INR 2.5-3.5 given EF and AF (JACC 2017).  EF 40-45%.  Rates better controlled today with higher dose of metoprolol.  BP's stable 120/80.   -- cont metoprolol xl 100 bid -- coumadin on hold, INR 2.1 today -- per cards OK to hold IV heparin until sheath out  UTI  - per [facility] 9/27 UA showed positive leukocytes on presentation and patient was receiving antibiotics as an outpatient. Now has an indwelling catheter for urinary retention and inability to get OOB due to sheath.  - Started on Rocephin IV x 5 d - Trend WBC, fever curve.   Recent umbilical hernia repair  Other medications -Continue iron qod  Transaminitis Asymptomatic.  Unclear chronicity.  Patient denies history of abnormal liver tests.  -Follow up with PCP - recheck  tomorrow      Kelly Splinter MD [facility] pgr [phone] 07/09/2017, 9:25 AM   DVT prophylaxis: SCD's until back on IV hep Code Status: FULL Family Communication: None present Disposition Plan: none at present    Consultants:   Cardiology  Gastroenterology  Procedures:   Echocardiogram 9/23 LV EF: 40% -   45%  ------------------------------------------------------------------- History:   PMH:  Chronic systolic heart failure.  Atrial fibrillation.  Risk factors:  ICD in place. Mechanical aortic valve replacement. Hypertension.  ------------------------------------------------------------------- Study Conclusions  - Left ventricle: The cavity size was normal. Systolic function was   mildly to moderately reduced. The estimated ejection fraction was   in the range of 40% to 45%. - Aortic valve: Mildly calcified annulus. Moderately thickened,   moderately calcified leaflets. There was moderate stenosis. Valve   area (VTI): 1.59 cm^2. Valve area (Vmax): 1.61 cm^2. Valve area   (Vmean): 1.34 cm^2. - Mitral valve: There was mild regurgitation. - Left atrium: The atrium was moderately dilated. - Right atrium: The atrium was mildly dilated. - Pulmonary arteries: Systolic pressure was mildly increased. PA   peak pressure: 34 mm Hg (S).   EGD 9/25  Colonoscopy 9/25 EGD was performed for hematemesis.  Findings: Normal esophagus, regular Z line at 40 cm from incisors. 3 nonbleeding slightly cratered but mostly superficial gastric ulcers in the antrum, largest 8 mm in dimension. Cardiac fundus normal on retroflexion. 3 large nonbleeding superficial duodenal ulcers with pigmented material found in the duodenal bulb and first portion of duodenum. Largest ulcer was 2 cm 3 cm in size. Not amenable for endoscopic treatment due to their is large size, not bleeding at present.  Colonoscopy was performed for hematochezia. Findings: A 6 mm polyp noted to  transverse colon, removed via hot snare polypectomy. A 9 mm polyp noted in rectum, removed via hot snare polypectomy. Several scattered small and large mouth diverticula noted in sigmoid and descending colon. The rest of the colon and retroflexion was unremarkable   Recommendations: PPI twice a day for 2 months, then indefinitely while patient is on Coumadin. H pylori stool antigen and treat if positive. Okay to resume regular diet. Patient to be started on heparin drip today, okay to transition to Coumadin, however remains at increased risk of bleeding due to multiple large ulcers noted in duodenal bulb.     Antimicrobials:   None       Data Reviewed: I have personally reviewed following labs and imaging studies:  CBC: Recent Labs  Lab 11/05/17 1518  11/07/17 0517 11/08/17 0427 11/09/17 0343 11/10/17 0435 11/10/17 1630 11/10/17 2316 11/11/17 0337  WBC 7.4   < > 5.9 8.0 10.8* 13.4*  --   --  12.6*  NEUTROABS 5.2  --   --   --   --   --   --   --   --  HGB 9.5*   < > 8.6* 8.5* 8.4* 6.7* 7.6* 8.2* 8.1*  HCT 29.6*   < > 26.8* 26.6* 26.5* 21.1* 23.3* 25.0* 25.1*  MCV 91.4   < > 93.4 93.0 94.0 93.8  --   --  91.3  PLT 101*   < > 158 216 266 266  --   --  229   < > = values in this interval not displayed.   Basic Metabolic Panel: Recent Labs  Lab 11/05/17 1518  11/07/17 0517 11/08/17 0427 11/09/17 0343 11/10/17 0435 11/11/17 0337  NA 140   < > 140 139 138 138 136  K 4.1   < > 3.7 4.1 4.0 4.4 4.0  CL 110   < > 108 107 109 109 110  CO2 21*   < > 25 25 24 24  20*  GLUCOSE 111*   < > 116* 99 119* 113* 105*  BUN 47*   < > 22 15 12 19 18   CREATININE 1.20   < > 1.08 1.02 1.03 1.04 0.97  CALCIUM 7.7*   < > 8.2* 8.2* 8.2* 7.8* 7.5*  MG 2.0  --   --   --   --   --   --   PHOS 1.7*  --  3.1  --   --   --   --    < > = values in this interval not displayed.   GFR: Estimated Creatinine Clearance: 69.4 mL/min (by C-G formula based on SCr of 0.97 mg/dL). Liver Function  Tests: Recent Labs  Lab 11/05/17 1518 11/07/17 0517 11/08/17 0427  AST 94* 137* 107*  ALT 51* 79* 69*  ALKPHOS 61 67 67  BILITOT 0.9 1.0 1.2  PROT 5.1* 5.6* 5.6*  ALBUMIN 2.6* 2.9* 2.9*   No results for input(s): LIPASE, AMYLASE in the last 168 hours. No results for input(s): AMMONIA in the last 168 hours. Coagulation Profile: Recent Labs  Lab 11/08/17 0427 11/09/17 0343 11/10/17 0435 11/10/17 1752 11/11/17 0337  INR 1.60 1.29 1.59 2.00 2.16     Scheduled Meds: . sodium chloride   Intravenous Once  . sodium chloride   Intravenous Once  . sodium chloride   Intravenous Once  . atorvastatin  10 mg Oral q1800  . cholecalciferol  1,000 Units Oral Daily  . ferrous sulfate  325 mg Oral QODAY  . metoprolol succinate  100 mg Oral BID  . omega-3 acid ethyl esters  1,000 mg Oral Daily  . [START ON 11/14/2017] pantoprazole  40 mg Intravenous Q12H  . tamsulosin  0.4 mg Oral QHS  . vitamin C  500 mg Oral Daily   Continuous Infusions: . sodium chloride 10 mL/hr at 11/11/17 0800  . cefTRIAXone (ROCEPHIN)  IV 1 g (11/10/17 2314)  . pantoprozole (PROTONIX) infusion 8 mg/hr (11/11/17 0800)     LOS: 6 days

## 2017-11-11 NOTE — Progress Notes (Signed)
Subjective: Patient was seen and examined at bedside. He reports that his last bowel movement was yesterday evening at 4 PM. He is on clear liquid diet and has not had a bowel movement since. Had empiric embolization of the gastroduodenal artery as the bleeding scan was positive for small amount of bleed in the proximal small bowel.  Objective: Vital signs in last 24 hours: Temp:  [97.5 F (36.4 C)-98.7 F (37.1 C)] 98.1 F (36.7 C) (09/28 0807) Pulse Rate:  [52-127] 83 (09/28 0800) Resp:  [16-40] 19 (09/28 0800) BP: (75-135)/(49-118) 107/73 (09/28 0800) SpO2:  [91 %-100 %] 97 % (09/28 0800) Weight:  [89.6 kg-90.5 kg] 89.6 kg (09/28 0341) Weight change: 1.7 kg Last BM Date: 11/10/17  ZL:DJTTS comfortably on bed, mild pallor GENERAL:not in distress, able to speak in full sentences ABDOMEN:soft, nondistended EXTREMITIES:no edema, no deformity  Lab Results: Results for orders placed or performed during the hospital encounter of 11/05/17 (from the past 48 hour(s))  Protime-INR     Status: Abnormal   Collection Time: 11/10/17  4:35 AM  Result Value Ref Range   Prothrombin Time 18.8 (H) 11.4 - 15.2 seconds   INR 1.59     Comment: Performed at [facility], [place] [address] [place], [place] [address]  Heparin level (unfractionated)     Status: None   Collection Time: 11/10/17  4:35 AM  Result Value Ref Range   Heparin Unfractionated 0.43 0.30 - 0.70 IU/mL    Comment: (NOTE) If heparin results are below expected values, and patient dosage has  been confirmed, suggest follow up testing of antithrombin III levels. Performed at [facility], [place] [address], [place] [address]   CBC     Status: Abnormal   Collection Time: 11/10/17  4:35 AM  Result Value Ref Range   WBC 13.4 (H) 4.0 - 10.5 K/uL   RBC 2.25 (L) 4.22 - 5.81 MIL/uL   Hemoglobin 6.7 (LL) 13.0 - 17.0 g/dL    Comment: REPEATED TO VERIFY CRITICAL RESULT CALLED TO, READ BACK BY AND VERIFIED  WITH: D.Assunta Curtis 9233 11/10/17 M.CAMPBELL    HCT 21.1 (L) 39.0 - 52.0 %   MCV 93.8 78.0 - 100.0 fL   MCH 29.8 26.0 - 34.0 pg   MCHC 31.8 30.0 - 36.0 g/dL   RDW 13.9 11.5 - 15.5 %   Platelets 266 150 - 400 K/uL    Comment: Performed at [facility] [address] [place], [place] [address]  Basic metabolic panel     Status: Abnormal   Collection Time: 11/10/17  4:35 AM  Result Value Ref Range   Sodium 138 135 - 145 mmol/L   Potassium 4.4 3.5 - 5.1 mmol/L   Chloride 109 98 - 111 mmol/L   CO2 24 22 - 32 mmol/L   Glucose, Bld 113 (H) 70 - 99 mg/dL   BUN 19 8 - 23 mg/dL   Creatinine, Ser 1.04 0.61 - 1.24 mg/dL   Calcium 7.8 (L) 8.9 - 10.3 mg/dL   GFR calc non Af Amer >60 >60 mL/min   GFR calc Af Amer >60 >60 mL/min    Comment: (NOTE) The eGFR has been calculated using the CKD EPI equation. This calculation has not been validated in all clinical situations. eGFR's persistently <60 mL/min signify possible Chronic Kidney Disease.    Anion gap 5 5 - 15    Comment: Performed at [facility] [address] [place], [place] [address]  Type and screen [place]     Status: None (Preliminary result)   Collection Time: 11/10/17  6:20 AM  Result Value Ref Range   ABO/RH(D) A POS    Antibody Screen NEG    Sample Expiration 11/13/2017    Unit Number [id number]    Blood Component Type RBC LR PHER2    Unit division 00    Status of Unit ISSUED    Transfusion Status OK TO TRANSFUSE    Crossmatch Result Compatible    Unit Number [id number]    Blood Component Type RED CELLS,LR    Unit division 00    Status of Unit ISSUED    Transfusion Status OK TO TRANSFUSE    Crossmatch Result Compatible    Unit Number [id number]    Blood Component Type RED CELLS,LR    Unit division 00    Status of Unit ISSUED    Transfusion Status OK TO TRANSFUSE    Crossmatch Result Compatible    Unit Number [id number]    Blood Component Type RED CELLS,LR    Unit  division 00    Status of Unit ALLOCATED    Transfusion Status OK TO TRANSFUSE    Crossmatch Result      Compatible Performed at [facility], [place] [address], [place], [place] [address]   Prepare RBC     Status: None   Collection Time: 11/10/17  6:20 AM  Result Value Ref Range   Order Confirmation      ORDER PROCESSED BY BLOOD BANK Performed at [facility], [place] [address], [place], [place] [address]   Hemoglobin and hematocrit, blood     Status: Abnormal   Collection Time: 11/10/17  4:30 PM  Result Value Ref Range   Hemoglobin 7.6 (L) 13.0 - 17.0 g/dL   HCT 23.3 (L) 39.0 - 52.0 %    Comment: Performed at [facility] [address] [place] [address] [place], [place] [address]  Protime-INR     Status: Abnormal   Collection Time: 11/10/17  5:52 PM  Result Value Ref Range   Prothrombin Time 22.5 (H) 11.4 - 15.2 seconds   INR 2.00     Comment: Performed at [facility] [address], [place], [place] [address]  Prepare RBC     Status: None   Collection Time: 11/10/17  6:10 PM  Result Value Ref Range   Order Confirmation      ORDER PROCESSED BY BLOOD BANK Performed at [facility], [place] [address], [place], [place] [address]   Glucose, capillary     Status: Abnormal   Collection Time: 11/10/17  8:48 PM  Result Value Ref Range   Glucose-Capillary 111 (H) 70 - 99 mg/dL  Prepare fresh frozen plasma     Status: None (Preliminary result)   Collection Time: 11/10/17  9:33 PM  Result Value Ref Range   Unit Number [id number]    Blood Component Type THAWED PLASMA    Unit division 00    Status of Unit ALLOCATED    Transfusion Status OK TO TRANSFUSE   Hemoglobin and hematocrit, blood     Status: Abnormal   Collection Time: 11/10/17 11:16 PM  Result Value Ref Range   Hemoglobin 8.2 (L) 13.0 - 17.0 g/dL   HCT 25.0 (L) 39.0 - 52.0 %    Comment: Performed at [facility], [place] [address] [place],  [address]  Protime-INR     Status: Abnormal  Collection  Time: 11/11/17  3:37 AM  Result Value Ref Range   Prothrombin Time 23.9 (H) 11.4 - 15.2 seconds   INR 2.16     Comment: Performed at [facility], [place] [address] [place], [place] [address]  CBC     Status: Abnormal   Collection Time: 11/11/17  3:37 AM  Result Value Ref Range   WBC 12.6 (H) 4.0 - 10.5 K/uL   RBC 2.75 (L) 4.22 - 5.81 MIL/uL   Hemoglobin 8.1 (L) 13.0 - 17.0 g/dL   HCT 25.1 (L) 39.0 - 52.0 %   MCV 91.3 78.0 - 100.0 fL   MCH 29.5 26.0 - 34.0 pg   MCHC 32.3 30.0 - 36.0 g/dL   RDW 15.9 (H) 11.5 - 15.5 %   Platelets 229 150 - 400 K/uL    Comment: Performed at [facility], [place] [address], [place], [place] [address]  Basic metabolic panel     Status: Abnormal   Collection Time: 11/11/17  3:37 AM  Result Value Ref Range   Sodium 136 135 - 145 mmol/L   Potassium 4.0 3.5 - 5.1 mmol/L   Chloride 110 98 - 111 mmol/L   CO2 20 (L) 22 - 32 mmol/L   Glucose, Bld 105 (H) 70 - 99 mg/dL   BUN 18 8 - 23 mg/dL   Creatinine, Ser 0.97 0.61 - 1.24 mg/dL   Calcium 7.5 (L) 8.9 - 10.3 mg/dL   GFR calc non Af Amer >60 >60 mL/min   GFR calc Af Amer >60 >60 mL/min    Comment: (NOTE) The eGFR has been calculated using the CKD EPI equation. This calculation has not been validated in all clinical situations. eGFR's persistently <60 mL/min signify possible Chronic Kidney Disease.    Anion gap 6 5 - 15    Comment: Performed at [facility] [address] [place], [place] [address]    Studies/Results: Nm Gi Blood Loss  Result Date: 11/10/2017 CLINICAL DATA:  Melena/hematochezia this morning. Recent endoscopy demonstrated antral ulcers and 3 large duodenal bulbs ulcers. Patient is on anticoagulation for mechanical valve and atrial fibrillation. EXAM: NUCLEAR MEDICINE GASTROINTESTINAL BLEEDING SCAN TECHNIQUE: Sequential abdominal images were obtained following intravenous administration of Tc-71mlabeled red blood cells. RADIOPHARMACEUTICALS:  26.3 mCi Tc-929mertechnetate  in-vitro labeled red cells. COMPARISON:  None. FINDINGS: There is a small focus abnormal radiotracer activity which appears to which to originate within the proximal small bowel. The overall amount of abnormal radiotracer activity is very small suggesting a small, transient bleed. Physiologic activity is seen within the heart, liver, urinary bladder, abdominal aorta and pelvic vasculature. IMPRESSION: Suspected transient proximal small bowel bleed. Critical Value/emergent results were called by telephone at the time of interpretation on 11/10/2017 at 1636 to Dr. ARRonnette Juniper who verbally acknowledged these results. Electronically Signed   By: JoSandi Mariscal.D.   On: 11/10/2017 16:35    Medications: I have reviewed the patient's current medications.  Assessment: 1.Recurrent GI bleed-likely from duodenal ulcers noted on endoscopy, status post empiric embolization of GDA as patient was noted to have active/transient small bowel bleed on bleeding scan Hemoglobin has remained stable at 8.2/8.1, after receiving 2 units PRBC( 6.7 yesterday) Remains hemodynamically stable  2.Atrial fibrillation, mechanical aortic valve, coronary artery disease,nonischemic cardiomyopathy   Plan: Patient to be started on regular diet. Received vitamin K 5 mg IV,INR was 2.16 this  morning. Has received IV Protonix drip for 24 hours and remains on  Protonix 40 mg IV every 12 hours. Okay to start patient on IV heparin from GI standpoint. Monitor H&H and transfuse as needed.  Ronnette Juniper 11/11/2017, 10:04 AM   Pager [phone] If no answer or after 5 PM call [phone]

## 2017-11-11 NOTE — Progress Notes (Signed)
Brief PCCM progress note  77 year old male on chronic anticoagulation for persistent A. fib, mechanical AVR with GI bleed status post IR embolization of gastroduodenal artery overnight 9/27.  PCCM consulted for ICU admission while sheath is in place.  Patient hemodynamically stable overnight.  Recent Labs  Lab 11/09/17 0343 11/10/17 0435 11/10/17 1630 11/10/17 2316 11/11/17 0337  HGB 8.4* 6.7* 7.6* 8.2* 8.1*   Recent Labs  Lab 11/08/17 0427 11/09/17 0343 11/10/17 0435 11/10/17 1752 11/11/17 0337  INR 1.60 1.29 1.59 2.00 2.16   Patient awake, alert, no complaints.  No vasopressors.  Remains on Protonix drip. Awaiting IR sheath removal.  Patient to remain in ICU as long as sheath in place-plan for removal today per IR. Otherwise no PCCM needs. Remains on [facility] service.  PCCM available PRN, please call if needed.  Nickolas Madrid, NP 11/11/2017  10:41 AM Pager: [phone] or [phone]

## 2017-11-11 NOTE — Progress Notes (Deleted)
Benefit check for Lovenox submitted. Will not be resulted until Monday.

## 2017-11-12 LAB — PROTIME-INR
INR: 1.19
Prothrombin Time: 15 seconds (ref 11.4–15.2)

## 2017-11-12 LAB — COMPREHENSIVE METABOLIC PANEL
ALBUMIN: 2.5 g/dL — AB (ref 3.5–5.0)
ALT: 46 U/L — ABNORMAL HIGH (ref 0–44)
AST: 56 U/L — AB (ref 15–41)
Alkaline Phosphatase: 53 U/L (ref 38–126)
Anion gap: 7 (ref 5–15)
BILIRUBIN TOTAL: 0.7 mg/dL (ref 0.3–1.2)
BUN: 14 mg/dL (ref 8–23)
CHLORIDE: 107 mmol/L (ref 98–111)
CO2: 21 mmol/L — AB (ref 22–32)
Calcium: 7.6 mg/dL — ABNORMAL LOW (ref 8.9–10.3)
Creatinine, Ser: 0.88 mg/dL (ref 0.61–1.24)
GFR calc Af Amer: >60 mL/min (ref >60)
GFR calc non Af Amer: >60 mL/min (ref >60)
GLUCOSE: 96 mg/dL (ref 70–99)
POTASSIUM: 3.9 mmol/L (ref 3.5–5.1)
SODIUM: 135 mmol/L (ref 135–145)
Total Protein: 4.6 g/dL — ABNORMAL LOW (ref 6.5–8.1)

## 2017-11-12 LAB — CBC
HCT: 23.4 % — ABNORMAL LOW (ref 39.0–52.0)
HEMOGLOBIN: 7.5 g/dL — AB (ref 13.0–17.0)
MCH: 30.2 pg (ref 26.0–34.0)
MCHC: 32.1 g/dL (ref 30.0–36.0)
MCV: 94.4 fL (ref 78.0–100.0)
Platelets: 222 10*3/uL (ref 150–400)
RBC: 2.48 MIL/uL — AB (ref 4.22–5.81)
RDW: 16.3 % — ABNORMAL HIGH (ref 11.5–15.5)
WBC: 10.7 10*3/uL — AB (ref 4.0–10.5)

## 2017-11-12 LAB — URINE CULTURE: CULTURE: NO GROWTH

## 2017-11-12 LAB — HEMOGLOBIN AND HEMATOCRIT, BLOOD
HCT: 31.3 % — ABNORMAL LOW (ref 39.0–52.0)
Hemoglobin: 9.9 g/dL — ABNORMAL LOW (ref 13.0–17.0)

## 2017-11-12 LAB — PREPARE RBC (CROSSMATCH)

## 2017-11-12 MED ORDER — SODIUM CHLORIDE 0.9% IV SOLUTION
Freq: Once | INTRAVENOUS | Status: AC
  Administered 2017-11-12: 09:00:00 via INTRAVENOUS

## 2017-11-12 NOTE — Progress Notes (Signed)
Text page sent to hospitalilist on call, for HGB of 7.5 and another bloody BM this am. If no answer we will re-page after the day shift MD's come on.     Charlsie Quest

## 2017-11-12 NOTE — Progress Notes (Signed)
PROGRESS NOTE  Timothy LIVSEY Sr. GYI:[id number] DOB: Jan 23, 1941 DOA: 11/05/2017 PCP: [name], MD  HPI/Recap of past 24 hours:  Timothy Fritz is a 77 y.o. M with NICM with ICD, EF 40%, mech AVR and AF on warfarin, and HTN who presented with several days melena, then abdominal pain and hematemesis.  Hb was 9.5 on admission 9/22. He received 2u prbc's on 9/22.  EGD 9/25 showed gastric ulcers as well as 3 large nonbleeding duodenal ulcers w/ discoloration at the bases, colonoscopy 9/25 showed 2 polyps which were removed. Coumadin was held , INR dropped from 3.7 on admission to 1.2 on 9/26.-Had empiric embolization of GDA.  11/12/2017: Patient seen and examined at his bedside.  Hemoglobin dropped to 7.3 this morning.  RN reports large dark stools this morning.  Patient endorses mild dizziness in bed.  No chest pain or dyspnea.  2 units PRBCs ordered to be transfused.  Unclear if ongoing bleeding or old blood.    Assessment/Plan: Active Problems:   Essential hypertension, benign   Atrial fibrillation with rapid ventricular response (HCC) -status post cardioversion   S/P aortic valve replacement with metallic valve   Chronic combined systolic and diastolic heart failure (HCC)   ICD (implantable cardioverter-defibrillator) in place   GI bleed   Supratherapeutic INR   Acute blood loss anemia  Acute upper GI bleed Post EGD and empiric GDA Gastric ulcers and 3 large gastric duodenal ulcers on EGD Large dark stools this a.m. with drop in hemoglobin 2 unit PRBC to be transfused Continue to monitor H&H closely Monitor vital signs  Status post aortic valve replacement Not currently anticoagulated due to suspected ongoing GI bleed High risk for CVA Defer to GI cardiology for anticoagulation  Acute blood loss anemia secondary to upper GI bleed Management as stated above  Nonischemic cardiomyopathy EF 40-45% Continue metoprolol Cardiology following  UTI On Rocephin Thus far urine  culture negative   Code Status: Full code  Family Communication: None at bedside  Disposition Plan: Home in 2 to 3 days or when cardiology and GI sign off.   Consultants:  Cardiology  GI  Interventional radiology  Procedures:  EGD, colonoscopy, IR angiogram  Antimicrobials:  Rocephin  DVT prophylaxis: SCDs   Objective: Vitals:   11/12/17 1058 11/12/17 1138 11/12/17 1211 11/12/17 1217  BP: 112/67   114/66  Pulse: 90  87 89  Resp:   (!) 26 (!) 22  Temp:  97.8 F (36.6 C)  98.2 F (36.8 C)  TempSrc:  Oral  Oral  SpO2:   98% 96%  Weight:      Height:        Intake/Output Summary (Last 24 hours) at 11/12/2017 1318 Last data filed at 11/12/2017 1211 Gross per 24 hour  Intake 1061.27 ml  Output 1402 ml  Net -340.73 ml   Filed Weights   11/10/17 0607 11/10/17 2100 11/11/17 0341  Weight: 88.8 kg 90.5 kg 89.6 kg    Exam:  . General: 77 y.o. year-old male well developed well nourished in no acute distress.  Alert and oriented x3. . Cardiovascular: Regular rate and rhythm with no rubs or gallops.  No thyromegaly or JVD noted.   Marland Kitchen Respiratory: Clear to auscultation with no wheezes or rales. Good inspiratory effort. . Abdomen: Soft nontender nondistended with normal bowel sounds x4 quadrants. . Musculoskeletal: No lower extremity edema. 2/4 pulses in all 4 extremities. Marland Kitchen Psychiatry: Mood is appropriate for condition and setting   Data Reviewed: CBC:  Recent Labs  Lab 11/05/17 1518  11/08/17 0427 11/09/17 0343 11/10/17 0435 11/10/17 1630 11/10/17 2316 11/11/17 0337 11/11/17 1819 11/12/17 0500  WBC 7.4   < > 8.0 10.8* 13.4*  --   --  12.6*  --  10.7*  NEUTROABS 5.2  --   --   --   --   --   --   --   --   --   HGB 9.5*   < > 8.5* 8.4* 6.7* 7.6* 8.2* 8.1* 7.7* 7.5*  HCT 29.6*   < > 26.6* 26.5* 21.1* 23.3* 25.0* 25.1* 23.4* 23.4*  MCV 91.4   < > 93.0 94.0 93.8  --   --  91.3  --  94.4  PLT 101*   < > 216 266 266  --   --  229  --  222   < > = values in  this interval not displayed.   Basic Metabolic Panel: Recent Labs  Lab 11/05/17 1518  11/07/17 0517 11/08/17 0427 11/09/17 0343 11/10/17 0435 11/11/17 0337 11/12/17 0500  NA 140   < > 140 139 138 138 136 135  K 4.1   < > 3.7 4.1 4.0 4.4 4.0 3.9  CL 110   < > 108 107 109 109 110 107  CO2 21*   < > 25 25 24 24  20* 21*  GLUCOSE 111*   < > 116* 99 119* 113* 105* 96  BUN 47*   < > 22 15 12 19 18 14   CREATININE 1.20   < > 1.08 1.02 1.03 1.04 0.97 0.88  CALCIUM 7.7*   < > 8.2* 8.2* 8.2* 7.8* 7.5* 7.6*  MG 2.0  --   --   --   --   --   --   --   PHOS 1.7*  --  3.1  --   --   --   --   --    < > = values in this interval not displayed.   GFR: Estimated Creatinine Clearance: 76.5 mL/min (by C-G formula based on SCr of 0.88 mg/dL). Liver Function Tests: Recent Labs  Lab 11/05/17 1518 11/07/17 0517 11/08/17 0427 11/12/17 0500  AST 94* 137* 107* 56*  ALT 51* 79* 69* 46*  ALKPHOS 61 67 67 53  BILITOT 0.9 1.0 1.2 0.7  PROT 5.1* 5.6* 5.6* 4.6*  ALBUMIN 2.6* 2.9* 2.9* 2.5*   No results for input(s): LIPASE, AMYLASE in the last 168 hours. No results for input(s): AMMONIA in the last 168 hours. Coagulation Profile: Recent Labs  Lab 11/09/17 0343 11/10/17 0435 11/10/17 1752 11/11/17 0337 11/12/17 0500  INR 1.29 1.59 2.00 2.16 1.19   Cardiac Enzymes: No results for input(s): CKTOTAL, CKMB, CKMBINDEX, TROPONINI in the last 168 hours. BNP (last 3 results) No results for input(s): PROBNP in the last 8760 hours. HbA1C: No results for input(s): HGBA1C in the last 72 hours. CBG: Recent Labs  Lab 11/10/17 2048  GLUCAP 111*   Lipid Profile: No results for input(s): CHOL, HDL, LDLCALC, TRIG, CHOLHDL, LDLDIRECT in the last 72 hours. Thyroid Function Tests: No results for input(s): TSH, T4TOTAL, FREET4, T3FREE, THYROIDAB in the last 72 hours. Anemia Panel: No results for input(s): VITAMINB12, FOLATE, FERRITIN, TIBC, IRON, RETICCTPCT in the last 72 hours. Urine analysis:      Component Value Date/Time   COLORURINE AMBER (A) 04/17/2012 1751   APPEARANCEUR CLOUDY (A) 04/17/2012 1751   LABSPEC 1.027 04/17/2012 1751   PHURINE 6.0 04/17/2012 1751  GLUCOSEU NEGATIVE 04/17/2012 1751   HGBUR NEGATIVE 04/17/2012 1751   BILIRUBINUR SMALL (A) 04/17/2012 1751   KETONESUR 15 (A) 04/17/2012 1751   PROTEINUR 30 (A) 04/17/2012 1751   UROBILINOGEN 1.0 04/17/2012 1751   NITRITE NEGATIVE 04/17/2012 1751   LEUKOCYTESUR LARGE (A) 04/17/2012 1751   Sepsis Labs: @LABRCNTIP (procalcitonin:4,lacticidven:4)  ) Recent Results (from the past 240 hour(s))  MRSA PCR Screening     Status: None   Collection Time: 11/05/17  2:08 PM  Result Value Ref Range Status   MRSA by PCR NEGATIVE NEGATIVE Final    Comment:        The GeneXpert MRSA Assay (FDA approved for NASAL specimens only), is one component of a comprehensive MRSA colonization surveillance program. It is not intended to diagnose MRSA infection nor to guide or monitor treatment for MRSA infections. Performed at [facility], [place] [address] [place], [place] [address]   Culture, Urine     Status: None   Collection Time: 11/10/17  9:59 PM  Result Value Ref Range Status   Specimen Description URINE, CATHETERIZED  Final   Special Requests NONE  Final   Culture   Final    NO GROWTH Performed at [facility], [address] [place], [place] [address]    Report Status 11/12/2017 FINAL  Final  Culture, blood (routine x 2)     Status: None (Preliminary result)   Collection Time: 11/10/17 11:12 PM  Result Value Ref Range Status   Specimen Description BLOOD RIGHT ANTECUBITAL  Final   Special Requests   Final    BOTTLES DRAWN AEROBIC AND ANAEROBIC Blood Culture adequate volume   Culture   Final    NO GROWTH 1 DAY Performed at [facility], [place] [address] [place], [place] [address]    Report Status PENDING  Incomplete  Culture, blood (routine x 2)     Status: None (Preliminary result)   Collection  Time: 11/10/17 11:19 PM  Result Value Ref Range Status   Specimen Description BLOOD RIGHT HAND  Final   Special Requests   Final    BOTTLES DRAWN AEROBIC AND ANAEROBIC Blood Culture adequate volume   Culture   Final    NO GROWTH 1 DAY Performed at [facility], [place] [address] [place],  [address]    Report Status PENDING  Incomplete      Studies: No results found.  Scheduled Meds: . atorvastatin  10 mg Oral q1800  . cholecalciferol  1,000 Units Oral Daily  . ferrous sulfate  325 mg Oral QODAY  . metoprolol succinate  100 mg Oral BID  . omega-3 acid ethyl esters  1,000 mg Oral Daily  . [START ON 11/14/2017] pantoprazole  40 mg Intravenous Q12H  . tamsulosin  0.4 mg Oral QHS  . vitamin C  500 mg Oral Daily    Continuous Infusions: . sodium chloride Stopped (11/12/17 0828)  . cefTRIAXone (ROCEPHIN)  IV Stopped (11/11/17 2220)  . pantoprozole (PROTONIX) infusion 8 mg/hr (11/12/17 1000)     LOS: 7 days     Kayleen Memos, MD [facility] Pager [phone]  If 7PM-7AM, please contact night-coverage [email or web address] Password [email or web address] 11/12/2017, 1:18 PM

## 2017-11-12 NOTE — Progress Notes (Signed)
Progress Note  Patient Name: Timothy CARDIFF Sr. Date of Encounter: 11/12/2017  Primary Cardiologist: Rozann Lesches, MD   Subjective   Denies CP or dyspnea  Inpatient Medications    Scheduled Meds: . atorvastatin  10 mg Oral q1800  . cholecalciferol  1,000 Units Oral Daily  . ferrous sulfate  325 mg Oral QODAY  . metoprolol succinate  100 mg Oral BID  . omega-3 acid ethyl esters  1,000 mg Oral Daily  . [START ON 11/14/2017] pantoprazole  40 mg Intravenous Q12H  . tamsulosin  0.4 mg Oral QHS  . vitamin C  500 mg Oral Daily   Continuous Infusions: . sodium chloride Stopped (11/12/17 0828)  . cefTRIAXone (ROCEPHIN)  IV Stopped (11/11/17 2220)  . pantoprozole (PROTONIX) infusion 8 mg/hr (11/12/17 1000)   PRN Meds: sodium chloride, acetaminophen, meclizine, senna-docusate, traMADol   Vital Signs    Vitals:   11/12/17 0915 11/12/17 1000 11/12/17 1058 11/12/17 1138  BP: 121/62 116/72 112/67   Pulse: 84 91 90   Resp: 18 20    Temp: 98 F (36.7 C)   97.8 F (36.6 C)  TempSrc: Oral   Oral  SpO2: 98% 97%    Weight:      Height:        Intake/Output Summary (Last 24 hours) at 11/12/2017 1208 Last data filed at 11/12/2017 1000 Gross per 24 hour  Intake 1096.13 ml  Output 1495 ml  Net -398.87 ml   Filed Weights   11/10/17 0607 11/10/17 2100 11/11/17 0341  Weight: 88.8 kg 90.5 kg 89.6 kg    Telemetry    Atrial fibrillation with intermittent pacing; rate controlled - Personally Reviewed  Physical Exam   GEN: No acute distress. WD WN  Neck: supple Cardiac: irregular; Mechanical valve sound remains crisp Respiratory: CTA anteriorly GI: Soft, mildly distended MS: no edema Neuro:  grossly intact  Labs    Chemistry Recent Labs  Lab 11/07/17 0517 11/08/17 0427  11/10/17 0435 11/11/17 0337 11/12/17 0500  NA 140 139   < > 138 136 135  K 3.7 4.1   < > 4.4 4.0 3.9  CL 108 107   < > 109 110 107  CO2 25 25   < > 24 20* 21*  GLUCOSE 116* 99   < > 113* 105*  96  BUN 22 15   < > 19 18 14   CREATININE 1.08 1.02   < > 1.04 0.97 0.88  CALCIUM 8.2* 8.2*   < > 7.8* 7.5* 7.6*  PROT 5.6* 5.6*  --   --   --  4.6*  ALBUMIN 2.9* 2.9*  --   --   --  2.5*  AST 137* 107*  --   --   --  56*  ALT 79* 69*  --   --   --  46*  ALKPHOS 67 67  --   --   --  53  BILITOT 1.0 1.2  --   --   --  0.7  GFRNONAA >60 >60   < > >60 >60 >60  GFRAA >60 >60   < > >60 >60 >60  ANIONGAP 7 7   < > 5 6 7    < > = values in this interval not displayed.     Hematology Recent Labs  Lab 11/10/17 0435  11/11/17 0337 11/11/17 1819 11/12/17 0500  WBC 13.4*  --  12.6*  --  10.7*  RBC 2.25*  --  2.75*  --  2.48*  HGB 6.7*   < > 8.1* 7.7* 7.5*  HCT 21.1*   < > 25.1* 23.4* 23.4*  MCV 93.8  --  91.3  --  94.4  MCH 29.8  --  29.5  --  30.2  MCHC 31.8  --  32.3  --  32.1  RDW 13.9  --  15.9*  --  16.3*  PLT 266  --  229  --  222   < > = values in this interval not displayed.    Radiology    Nm Gi Blood Loss  Result Date: 11/10/2017 CLINICAL DATA:  Melena/hematochezia this morning. Recent endoscopy demonstrated antral ulcers and 3 large duodenal bulbs ulcers. Patient is on anticoagulation for mechanical valve and atrial fibrillation. EXAM: NUCLEAR MEDICINE GASTROINTESTINAL BLEEDING SCAN TECHNIQUE: Sequential abdominal images were obtained following intravenous administration of Tc-39m labeled red blood cells. RADIOPHARMACEUTICALS:  26.3 mCi Tc-24m pertechnetate in-vitro labeled red cells. COMPARISON:  None. FINDINGS: There is a small focus abnormal radiotracer activity which appears to which to originate within the proximal small bowel. The overall amount of abnormal radiotracer activity is very small suggesting a small, transient bleed. Physiologic activity is seen within the heart, liver, urinary bladder, abdominal aorta and pelvic vasculature. IMPRESSION: Suspected transient proximal small bowel bleed. Critical Value/emergent results were called by telephone at the time of  interpretation on 11/10/2017 at 1636 to Dr. Ronnette Juniper , who verbally acknowledged these results. Electronically Signed   By: Sandi Mariscal M.D.   On: 11/10/2017 16:35   Ir Angiogram Visceral Selective  Result Date: 11/11/2017 INDICATION: Gastrointestinal bleeding EXAM: MESENTERIC ANGIOGRAM AND GASTRODUODENAL ARTERY EMBOLIZATION. MEDICATIONS: NONE ANESTHESIA/SEDATION: Moderate (conscious) sedation was employed during this procedure. A total of Versed 1 mg and Fentanyl 50 mcg was administered intravenously. Moderate Sedation Time: 76 minutes. The patient's level of consciousness and vital signs were monitored continuously by radiology nursing throughout the procedure under my direct supervision. CONTRAST:  170 CC ISOVUE-300 FLUOROSCOPY TIME:  Fluoroscopy Time: 13 minutes 30 seconds (815 mGy). COMPLICATIONS: None immediate. PROCEDURE: Informed consent was obtained from the patient following explanation of the procedure, risks, benefits and alternatives. The patient understands, agrees and consents for the procedure. All questions were addressed. A time out was performed prior to the initiation of the procedure. Maximal barrier sterile technique utilized including caps, mask, sterile gowns, sterile gloves, large sterile drape, hand hygiene, and Betadine prep. The right groin was prepped and draped in a sterile fashion. 1% lidocaine was utilized for local anesthesia. Under sonographic guidance, a micropuncture needle was inserted into the right common femoral artery and removed over a 018 wire. This was up sized to a Bentson. The common femoral artery was noted to be patent. Sonographic documentation was obtained A 5 French sheath was inserted. A 5 French pigtail catheter was advanced into the aorta. Lateral abdominal aortography was performed for the origins of the celiac and SMA. The catheter was retracted. RA 0 distal abdominal aortography was performed for the origin of the IMA. The pigtail was exchanged for a  cobra 2 catheter. The celiac was selected. Angiography was performed. This was advanced over a glidewire into the common hepatic artery. Angiography was performed. A Renegade microcatheter was advanced over a 016 fathom wire into the gastroduodenal artery and angiography was performed. The catheter was then advanced over the wire into the distal gastroduodenal artery. Embolization was performed utilizing 3 mm and 4 mm diameter interlock coils for total embolization of the gastroduodenal artery. The microcatheter was  retracted into the common hepatic artery and repeat angiography was performed. The Cobra 2 catheter was aspirated and flushed. It was retracted into the abdominal aorta. It was advanced over a Bentson wire into the SMA and angiography was performed. It was then retracted and advanced over a Bentson wire into the IMA and angiography was performed. The catheter was then advanced into the right external iliac artery and right RA 0 femoral angiography was performed. The sheath was flushed and sewn in place.  The patient's INR is 2.1. FINDINGS: Lateral abdominal aortography demonstrates patency of the celiac and SMA. RA 0 distal abdominal aortography demonstrates patency of the origin of the IMA. Celiac angiography delineates anatomy. There is no evidence of active bleeding within the territory of the gastroduodenal artery or left gastric artery Selective angiography of the common hepatic artery demonstrates patency of the gastroduodenal artery. Subsequent images demonstrate coil embolization of the gastroduodenal artery. 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 Injection of the SMA demonstrates patency of the branches but no source of active bleeding. Angiography of the IMA over the pelvis and left upper quadrant demonstrates no evidence of bleeding from the descending or sigmoid colon. IMPRESSION: Angiography of the celiac, SMA, and IMA  demonstrates no active source of bleeding. Empiric embolization of the gastroduodenal artery was performed. The right femoral artery sheath was sewn in place in preparation for sheath removal after the INR is normalized. Electronically Signed   By: Marybelle Killings M.D.   On: 11/11/2017 14:28   Ir Angiogram Visceral Selective  Result Date: 11/11/2017 INDICATION: Gastrointestinal bleeding EXAM: MESENTERIC ANGIOGRAM AND GASTRODUODENAL ARTERY EMBOLIZATION. MEDICATIONS: NONE ANESTHESIA/SEDATION: Moderate (conscious) sedation was employed during this procedure. A total of Versed 1 mg and Fentanyl 50 mcg was administered intravenously. Moderate Sedation Time: 76 minutes. The patient's level of consciousness and vital signs were monitored continuously by radiology nursing throughout the procedure under my direct supervision. CONTRAST:  170 CC ISOVUE-300 FLUOROSCOPY TIME:  Fluoroscopy Time: 13 minutes 30 seconds (815 mGy). COMPLICATIONS: None immediate. PROCEDURE: Informed consent was obtained from the patient following explanation of the procedure, risks, benefits and alternatives. The patient understands, agrees and consents for the procedure. All questions were addressed. A time out was performed prior to the initiation of the procedure. Maximal barrier sterile technique utilized including caps, mask, sterile gowns, sterile gloves, large sterile drape, hand hygiene, and Betadine prep. The right groin was prepped and draped in a sterile fashion. 1% lidocaine was utilized for local anesthesia. Under sonographic guidance, a micropuncture needle was inserted into the right common femoral artery and removed over a 018 wire. This was up sized to a Bentson. The common femoral artery was noted to be patent. Sonographic documentation was obtained A 5 French sheath was inserted. A 5 French pigtail catheter was advanced into the aorta. Lateral abdominal aortography was performed for the origins of the celiac and SMA. The catheter  was retracted. RA 0 distal abdominal aortography was performed for the origin of the IMA. The pigtail was exchanged for a cobra 2 catheter. The celiac was selected. Angiography was performed. This was advanced over a glidewire into the common hepatic artery. Angiography was performed. A Renegade microcatheter was advanced over a 016 fathom wire into the gastroduodenal artery and angiography was performed. The catheter was then advanced over the wire into the distal gastroduodenal artery. Embolization was performed utilizing 3 mm and 4 mm diameter interlock coils for total embolization of the gastroduodenal artery.  The microcatheter was retracted into the common hepatic artery and repeat angiography was performed. The Cobra 2 catheter was aspirated and flushed. It was retracted into the abdominal aorta. It was advanced over a Bentson wire into the SMA and angiography was performed. It was then retracted and advanced over a Bentson wire into the IMA and angiography was performed. The catheter was then advanced into the right external iliac artery and right RA 0 femoral angiography was performed. The sheath was flushed and sewn in place.  The patient's INR is 2.1. FINDINGS: Lateral abdominal aortography demonstrates patency of the celiac and SMA. RA 0 distal abdominal aortography demonstrates patency of the origin of the IMA. Celiac angiography delineates anatomy. There is no evidence of active bleeding within the territory of the gastroduodenal artery or left gastric artery Selective angiography of the common hepatic artery demonstrates patency of the gastroduodenal artery. Subsequent images demonstrate coil embolization of the gastroduodenal artery. 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 Injection of the SMA demonstrates patency of the branches but no source of active bleeding. Angiography of the IMA over the pelvis and left upper quadrant demonstrates  no evidence of bleeding from the descending or sigmoid colon. IMPRESSION: Angiography of the celiac, SMA, and IMA demonstrates no active source of bleeding. Empiric embolization of the gastroduodenal artery was performed. The right femoral artery sheath was sewn in place in preparation for sheath removal after the INR is normalized. Electronically Signed   By: Marybelle Killings M.D.   On: 11/11/2017 14:28   Ir Angiogram Visceral Selective  Result Date: 11/11/2017 INDICATION: Gastrointestinal bleeding EXAM: MESENTERIC ANGIOGRAM AND GASTRODUODENAL ARTERY EMBOLIZATION. MEDICATIONS: NONE ANESTHESIA/SEDATION: Moderate (conscious) sedation was employed during this procedure. A total of Versed 1 mg and Fentanyl 50 mcg was administered intravenously. Moderate Sedation Time: 76 minutes. The patient's level of consciousness and vital signs were monitored continuously by radiology nursing throughout the procedure under my direct supervision. CONTRAST:  170 CC ISOVUE-300 FLUOROSCOPY TIME:  Fluoroscopy Time: 13 minutes 30 seconds (815 mGy). COMPLICATIONS: None immediate. PROCEDURE: Informed consent was obtained from the patient following explanation of the procedure, risks, benefits and alternatives. The patient understands, agrees and consents for the procedure. All questions were addressed. A time out was performed prior to the initiation of the procedure. Maximal barrier sterile technique utilized including caps, mask, sterile gowns, sterile gloves, large sterile drape, hand hygiene, and Betadine prep. The right groin was prepped and draped in a sterile fashion. 1% lidocaine was utilized for local anesthesia. Under sonographic guidance, a micropuncture needle was inserted into the right common femoral artery and removed over a 018 wire. This was up sized to a Bentson. The common femoral artery was noted to be patent. Sonographic documentation was obtained A 5 French sheath was inserted. A 5 French pigtail catheter was advanced  into the aorta. Lateral abdominal aortography was performed for the origins of the celiac and SMA. The catheter was retracted. RA 0 distal abdominal aortography was performed for the origin of the IMA. The pigtail was exchanged for a cobra 2 catheter. The celiac was selected. Angiography was performed. This was advanced over a glidewire into the common hepatic artery. Angiography was performed. A Renegade microcatheter was advanced over a 016 fathom wire into the gastroduodenal artery and angiography was performed. The catheter was then advanced over the wire into the distal gastroduodenal artery. Embolization was performed utilizing 3 mm and 4 mm diameter interlock coils for total embolization of  the gastroduodenal artery. The microcatheter was retracted into the common hepatic artery and repeat angiography was performed. The Cobra 2 catheter was aspirated and flushed. It was retracted into the abdominal aorta. It was advanced over a Bentson wire into the SMA and angiography was performed. It was then retracted and advanced over a Bentson wire into the IMA and angiography was performed. The catheter was then advanced into the right external iliac artery and right RA 0 femoral angiography was performed. The sheath was flushed and sewn in place.  The patient's INR is 2.1. FINDINGS: Lateral abdominal aortography demonstrates patency of the celiac and SMA. RA 0 distal abdominal aortography demonstrates patency of the origin of the IMA. Celiac angiography delineates anatomy. There is no evidence of active bleeding within the territory of the gastroduodenal artery or left gastric artery Selective angiography of the common hepatic artery demonstrates patency of the gastroduodenal artery. Subsequent images demonstrate coil embolization of the gastroduodenal artery. 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 Injection of the SMA demonstrates patency of the branches  but no source of active bleeding. Angiography of the IMA over the pelvis and left upper quadrant demonstrates no evidence of bleeding from the descending or sigmoid colon. IMPRESSION: Angiography of the celiac, SMA, and IMA demonstrates no active source of bleeding. Empiric embolization of the gastroduodenal artery was performed. The right femoral artery sheath was sewn in place in preparation for sheath removal after the INR is normalized. Electronically Signed   By: Marybelle Killings M.D.   On: 11/11/2017 14:28   Ir Angiogram Selective Each Additional Vessel  Result Date: 11/11/2017 INDICATION: Gastrointestinal bleeding EXAM: MESENTERIC ANGIOGRAM AND GASTRODUODENAL ARTERY EMBOLIZATION. MEDICATIONS: NONE ANESTHESIA/SEDATION: Moderate (conscious) sedation was employed during this procedure. A total of Versed 1 mg and Fentanyl 50 mcg was administered intravenously. Moderate Sedation Time: 76 minutes. The patient's level of consciousness and vital signs were monitored continuously by radiology nursing throughout the procedure under my direct supervision. CONTRAST:  170 CC ISOVUE-300 FLUOROSCOPY TIME:  Fluoroscopy Time: 13 minutes 30 seconds (815 mGy). COMPLICATIONS: None immediate. PROCEDURE: Informed consent was obtained from the patient following explanation of the procedure, risks, benefits and alternatives. The patient understands, agrees and consents for the procedure. All questions were addressed. A time out was performed prior to the initiation of the procedure. Maximal barrier sterile technique utilized including caps, mask, sterile gowns, sterile gloves, large sterile drape, hand hygiene, and Betadine prep. The right groin was prepped and draped in a sterile fashion. 1% lidocaine was utilized for local anesthesia. Under sonographic guidance, a micropuncture needle was inserted into the right common femoral artery and removed over a 018 wire. This was up sized to a Bentson. The common femoral artery was noted  to be patent. Sonographic documentation was obtained A 5 French sheath was inserted. A 5 French pigtail catheter was advanced into the aorta. Lateral abdominal aortography was performed for the origins of the celiac and SMA. The catheter was retracted. RA 0 distal abdominal aortography was performed for the origin of the IMA. The pigtail was exchanged for a cobra 2 catheter. The celiac was selected. Angiography was performed. This was advanced over a glidewire into the common hepatic artery. Angiography was performed. A Renegade microcatheter was advanced over a 016 fathom wire into the gastroduodenal artery and angiography was performed. The catheter was then advanced over the wire into the distal gastroduodenal artery. Embolization was performed utilizing 3 mm and 4 mm diameter interlock  coils for total embolization of the gastroduodenal artery. The microcatheter was retracted into the common hepatic artery and repeat angiography was performed. The Cobra 2 catheter was aspirated and flushed. It was retracted into the abdominal aorta. It was advanced over a Bentson wire into the SMA and angiography was performed. It was then retracted and advanced over a Bentson wire into the IMA and angiography was performed. The catheter was then advanced into the right external iliac artery and right RA 0 femoral angiography was performed. The sheath was flushed and sewn in place.  The patient's INR is 2.1. FINDINGS: Lateral abdominal aortography demonstrates patency of the celiac and SMA. RA 0 distal abdominal aortography demonstrates patency of the origin of the IMA. Celiac angiography delineates anatomy. There is no evidence of active bleeding within the territory of the gastroduodenal artery or left gastric artery Selective angiography of the common hepatic artery demonstrates patency of the gastroduodenal artery. Subsequent images demonstrate coil embolization of the gastroduodenal artery. Subsequent angiogram of the common  hepatic artery demonstrates occlusion of the gastroduodenal artery. Injection of the SMA demonstrates patency of the branches but no source of active bleeding. Angiography of the IMA over the pelvis and left upper quadrant demonstrates no evidence of bleeding from the descending or sigmoid colon. IMPRESSION: Angiography of the celiac, SMA, and IMA demonstrates no active source of bleeding. Empiric embolization of the gastroduodenal artery was performed. The right femoral artery sheath was sewn in place in preparation for sheath removal after the INR is normalized. Electronically Signed   By: Marybelle Killings M.D.   On: 11/11/2017 14:28   Ir US Guide Vasc Access Right  Result Date: 11/11/2017 INDICATION: Gastrointestinal bleeding EXAM: MESENTERIC ANGIOGRAM AND GASTRODUODENAL ARTERY EMBOLIZATION. MEDICATIONS: NONE ANESTHESIA/SEDATION: Moderate (conscious) sedation was employed during this procedure. A total of Versed 1 mg and Fentanyl 50 mcg was administered intravenously. Moderate Sedation Time: 76 minutes. The patient's level of consciousness and vital signs were monitored continuously by radiology nursing throughout the procedure under my direct supervision. CONTRAST:  170 CC ISOVUE-300 FLUOROSCOPY TIME:  Fluoroscopy Time: 13 minutes 30 seconds (815 mGy). COMPLICATIONS: None immediate. PROCEDURE: Informed consent was obtained from the patient following explanation of the procedure, risks, benefits and alternatives. The patient understands, agrees and consents for the procedure. All questions were addressed. A time out was performed prior to the initiation of the procedure. Maximal barrier sterile technique utilized including caps, mask, sterile gowns, sterile gloves, large sterile drape, hand hygiene, and Betadine prep. The right groin was prepped and draped in a sterile fashion. 1% lidocaine was utilized for local anesthesia. Under sonographic guidance, a micropuncture needle was inserted into the right common  femoral artery and removed over a 018 wire. This was up sized to a Bentson. The common femoral artery was noted to be patent. Sonographic documentation was obtained A 5 French sheath was inserted. A 5 French pigtail catheter was advanced into the aorta. Lateral abdominal aortography was performed for the origins of the celiac and SMA. The catheter was retracted. RA 0 distal abdominal aortography was performed for the origin of the IMA. The pigtail was exchanged for a cobra 2 catheter. The celiac was selected. Angiography was performed. This was advanced over a glidewire into the common hepatic artery. Angiography was performed. A Renegade microcatheter was advanced over a 016 fathom wire into the gastroduodenal artery and angiography was performed. The catheter was then advanced over the wire into the distal gastroduodenal artery. Embolization was performed utilizing 3 mm  and 4 mm diameter interlock coils for total embolization of the gastroduodenal artery. The microcatheter was retracted into the common hepatic artery and repeat angiography was performed. The Cobra 2 catheter was aspirated and flushed. It was retracted into the abdominal aorta. It was advanced over a Bentson wire into the SMA and angiography was performed. It was then retracted and advanced over a Bentson wire into the IMA and angiography was performed. The catheter was then advanced into the right external iliac artery and right RA 0 femoral angiography was performed. The sheath was flushed and sewn in place.  The patient's INR is 2.1. FINDINGS: Lateral abdominal aortography demonstrates patency of the celiac and SMA. RA 0 distal abdominal aortography demonstrates patency of the origin of the IMA. Celiac angiography delineates anatomy. There is no evidence of active bleeding within the territory of the gastroduodenal artery or left gastric artery Selective angiography of the common hepatic artery demonstrates patency of the gastroduodenal artery.  Subsequent images demonstrate coil embolization of the gastroduodenal artery. 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 Injection of the SMA demonstrates patency of the branches but no source of active bleeding. Angiography of the IMA over the pelvis and left upper quadrant demonstrates no evidence of bleeding from the descending or sigmoid colon. IMPRESSION: Angiography of the celiac, SMA, and IMA demonstrates no active source of bleeding. Empiric embolization of the gastroduodenal artery was performed. The right femoral artery sheath was sewn in place in preparation for sheath removal after the INR is normalized. Electronically Signed   By: Marybelle Killings M.D.   On: 11/11/2017 14:28    Patient Profile     Timothy Fritzis a 77 y.o.malewith a hx of aortic aneurysm status post Bentall procedure in 2002 with mechanical AVR, coronary artery disease nonobstructive in 2002, hypertension, hyperlipidemia, nonischemic cardiomyopathy status post [facility] Jude CRT-D in 2014, and persistent atrial fibrillationwho was admitted after multipleICD shocks, due to rapid afib.  Patient found to have a GI bleed.  He was found to have duodenal ulcers, polyps and diverticulosis with GI evaluation.  Recurrent bleeding and nuclear scan showed bleeding from proximal small bowel versus duodenum.  Had empiric embolization of GDA. Last echocardiogram September 2019 showed ejection fraction 40 to 45%, became to call aortic valve with mean gradient 15 mmHg, mild mitral regurgitation and biatrial enlargement.  Assessment & Plan   1 GI bleed-recurrent melena this morning.  He is status post empiric embolization of GDA.  Continue Protonix.  Patient is receiving transfusion. Follow hemoglobin.  2 status post aortic valve replacement-patient continues to require transfusion and had recurrent melena today.  I discussed case with Dr Therisa Doyne.  Given possible active bleeding we  will hold all anticoagulation.  Follow hemoglobin closely.  Will resume heparin once it is clear hemoglobin is stable and bleeding has stopped.  Patient understands there is some risk with this including CVA and valve thrombosis but at this point we have no choice.    3 permanent atrial fibrillation-heart rate controlled.  Continue present dose of metoprolol.  4 prior ICD shocks-this is felt secondary to atrial fibrillation with rapid ventricular response.  This occurred in the setting of ongoing bleeding/anemia.  No recurrent episodes.  5 chronic combined systolic/diastolic congestive heart failure-patient is euvolemic on examination.  Diuretics on hold as patient is n.p.o.  Follow closely for volume excess.  For questions or updates, please contact [place] Please consult [email or web address] for contact info under  Signed, Kirk Ruths, MD  11/12/2017, 12:08 PM

## 2017-11-12 NOTE — Progress Notes (Signed)
Subjective: Patient had a bowel movement today described as medium, liquid and dark in color, but associated with episodes of hypotension or tachycardia. Hemoglobin was 7.7 yesterday evening and 7.5 today morning. The patient has not been started on heparin since IR guided embolization.  Objective: Vital signs in last 24 hours: Temp:  [97.1 F (36.2 C)-98.3 F (36.8 C)] 98 F (36.7 C) (09/29 0915) Pulse Rate:  [79-93] 84 (09/29 0915) Resp:  [15-36] 18 (09/29 0915) BP: (105-134)/(52-77) 121/62 (09/29 0915) SpO2:  [95 %-99 %] 98 % (09/29 0915) Weight change:  Last BM Date: 11/10/17  MG:QQPY pallor GENERAL:not in distress ABDOMEN:soft, hyperactive bowel sounds, nontender, nondistended EXTREMITIES:no deformity  Lab Results: Results for orders placed or performed during the hospital encounter of 11/05/17 (from the past 48 hour(s))  Hemoglobin and hematocrit, blood     Status: Abnormal   Collection Time: 11/10/17  4:30 PM  Result Value Ref Range   Hemoglobin 7.6 (L) 13.0 - 17.0 g/dL   HCT 23.3 (L) 39.0 - 52.0 %    Comment: Performed at [facility], [place] [address], [place], [place] [address]  Protime-INR     Status: Abnormal   Collection Time: 11/10/17  5:52 PM  Result Value Ref Range   Prothrombin Time 22.5 (H) 11.4 - 15.2 seconds   INR 2.00     Comment: Performed at [facility] [address] [place], [place] [address]  Prepare RBC     Status: None   Collection Time: 11/10/17  6:10 PM  Result Value Ref Range   Order Confirmation      ORDER PROCESSED BY BLOOD BANK Performed at [facility], [place] [address] [place], [place] [address]   Glucose, capillary     Status: Abnormal   Collection Time: 11/10/17  8:48 PM  Result Value Ref Range   Glucose-Capillary 111 (H) 70 - 99 mg/dL  Prepare fresh frozen plasma     Status: None (Preliminary result)   Collection Time: 11/10/17  9:33 PM  Result Value Ref Range   Unit Number [id number]    Blood Component  Type THAWED PLASMA    Unit division 00    Status of Unit ALLOCATED    Transfusion Status OK TO TRANSFUSE   Culture, Urine     Status: None   Collection Time: 11/10/17  9:59 PM  Result Value Ref Range   Specimen Description URINE, CATHETERIZED    Special Requests NONE    Culture      NO GROWTH Performed at [facility], [place] [address], [place], [place] [address]    Report Status 11/12/2017 FINAL   Hemoglobin and hematocrit, blood     Status: Abnormal   Collection Time: 11/10/17 11:16 PM  Result Value Ref Range   Hemoglobin 8.2 (L) 13.0 - 17.0 g/dL   HCT 25.0 (L) 39.0 - 52.0 %    Comment: Performed at [facility], [place] [address], [place], [place] [address]  Protime-INR     Status: Abnormal   Collection Time: 11/11/17  3:37 AM  Result Value Ref Range   Prothrombin Time 23.9 (H) 11.4 - 15.2 seconds   INR 2.16     Comment: Performed at [facility] [address] [place],  [address]  CBC     Status: Abnormal   Collection Time: 11/11/17  3:37 AM  Result Value Ref Range   WBC 12.6 (H) 4.0 - 10.5 K/uL   RBC  2.75 (L) 4.22 - 5.81 MIL/uL   Hemoglobin 8.1 (L) 13.0 - 17.0 g/dL   HCT 25.1 (L) 39.0 - 52.0 %   MCV 91.3 78.0 - 100.0 fL   MCH 29.5 26.0 - 34.0 pg   MCHC 32.3 30.0 - 36.0 g/dL   RDW 15.9 (H) 11.5 - 15.5 %   Platelets 229 150 - 400 K/uL    Comment: Performed at [facility] [address] [place], [place] [address]  Basic metabolic panel     Status: Abnormal   Collection Time: 11/11/17  3:37 AM  Result Value Ref Range   Sodium 136 135 - 145 mmol/L   Potassium 4.0 3.5 - 5.1 mmol/L   Chloride 110 98 - 111 mmol/L   CO2 20 (L) 22 - 32 mmol/L   Glucose, Bld 105 (H) 70 - 99 mg/dL   BUN 18 8 - 23 mg/dL   Creatinine, Ser 0.97 0.61 - 1.24 mg/dL   Calcium 7.5 (L) 8.9 - 10.3 mg/dL   GFR calc non Af Amer >60 >60 mL/min   GFR calc Af Amer >60 >60 mL/min    Comment: (NOTE) The eGFR has been calculated using the CKD EPI equation. This calculation  has not been validated in all clinical situations. eGFR's persistently <60 mL/min signify possible Chronic Kidney Disease.    Anion gap 6 5 - 15    Comment: Performed at [facility] [address] [place], [place] [address]  Hemoglobin and hematocrit, blood     Status: Abnormal   Collection Time: 11/11/17  6:19 PM  Result Value Ref Range   Hemoglobin 7.7 (L) 13.0 - 17.0 g/dL   HCT 23.4 (L) 39.0 - 52.0 %    Comment: Performed at [facility] [address] [place], [place] [address]  CBC     Status: Abnormal   Collection Time: 11/12/17  5:00 AM  Result Value Ref Range   WBC 10.7 (H) 4.0 - 10.5 K/uL   RBC 2.48 (L) 4.22 - 5.81 MIL/uL   Hemoglobin 7.5 (L) 13.0 - 17.0 g/dL   HCT 23.4 (L) 39.0 - 52.0 %   MCV 94.4 78.0 - 100.0 fL   MCH 30.2 26.0 - 34.0 pg   MCHC 32.1 30.0 - 36.0 g/dL   RDW 16.3 (H) 11.5 - 15.5 %   Platelets 222 150 - 400 K/uL    Comment: Performed at [facility], [place] [address], [place], [place] [address]  Protime-INR     Status: None   Collection Time: 11/12/17  5:00 AM  Result Value Ref Range   Prothrombin Time 15.0 11.4 - 15.2 seconds   INR 1.19     Comment: Performed at [facility], [place] [address] [place], [place] [address]  Comprehensive metabolic panel     Status: Abnormal   Collection Time: 11/12/17  5:00 AM  Result Value Ref Range   Sodium 135 135 - 145 mmol/L   Potassium 3.9 3.5 - 5.1 mmol/L   Chloride 107 98 - 111 mmol/L   CO2 21 (L) 22 - 32 mmol/L   Glucose, Bld 96 70 - 99 mg/dL   BUN 14 8 - 23 mg/dL   Creatinine, Ser 0.88 0.61 - 1.24 mg/dL   Calcium 7.6 (L) 8.9 - 10.3 mg/dL   Total Protein 4.6 (L) 6.5 - 8.1 g/dL   Albumin 2.5 (L) 3.5 - 5.0 g/dL   AST 56 (H) 15 - 41 U/L  ALT 46 (H) 0 - 44 U/L   Alkaline Phosphatase 53 38 - 126 U/L   Total Bilirubin 0.7 0.3 - 1.2 mg/dL   GFR calc non Af Amer >60 >60 mL/min   GFR calc Af Amer >60 >60 mL/min    Comment: (NOTE) The eGFR has been calculated using the CKD EPI  equation. This calculation has not been validated in all clinical situations. eGFR's persistently <60 mL/min signify possible Chronic Kidney Disease.    Anion gap 7 5 - 15    Comment: Performed at [facility] [address], [place], [place] [address]  Prepare RBC     Status: None   Collection Time: 11/12/17  8:01 AM  Result Value Ref Range   Order Confirmation      ORDER PROCESSED BY BLOOD BANK Performed at [facility], [place] [address], [place], [place] [address]     Studies/Results: Nm Gi Blood Loss  Result Date: 11/10/2017 CLINICAL DATA:  Melena/hematochezia this morning. Recent endoscopy demonstrated antral ulcers and 3 large duodenal bulbs ulcers. Patient is on anticoagulation for mechanical valve and atrial fibrillation. EXAM: NUCLEAR MEDICINE GASTROINTESTINAL BLEEDING SCAN TECHNIQUE: Sequential abdominal images were obtained following intravenous administration of Tc-44mlabeled red blood cells. RADIOPHARMACEUTICALS:  26.3 mCi Tc-964mertechnetate in-vitro labeled red cells. COMPARISON:  None. FINDINGS: There is a small focus abnormal radiotracer activity which appears to which to originate within the proximal small bowel. The overall amount of abnormal radiotracer activity is very small suggesting a small, transient bleed. Physiologic activity is seen within the heart, liver, urinary bladder, abdominal aorta and pelvic vasculature. IMPRESSION: Suspected transient proximal small bowel bleed. Critical Value/emergent results were called by telephone at the time of interpretation on 11/10/2017 at 1636 to Dr. ARRonnette Juniper who verbally acknowledged these results. Electronically Signed   By: JoSandi Mariscal.D.   On: 11/10/2017 16:35   Ir Angiogram Visceral Selective  Result Date: 11/11/2017 INDICATION: Gastrointestinal bleeding EXAM: MESENTERIC ANGIOGRAM AND GASTRODUODENAL ARTERY EMBOLIZATION. MEDICATIONS: NONE ANESTHESIA/SEDATION: Moderate (conscious) sedation was employed during  this procedure. A total of Versed 1 mg and Fentanyl 50 mcg was administered intravenously. Moderate Sedation Time: 76 minutes. The patient's level of consciousness and vital signs were monitored continuously by radiology nursing throughout the procedure under my direct supervision. CONTRAST:  170 CC ISOVUE-300 FLUOROSCOPY TIME:  Fluoroscopy Time: 13 minutes 30 seconds (815 mGy). COMPLICATIONS: None immediate. PROCEDURE: Informed consent was obtained from the patient following explanation of the procedure, risks, benefits and alternatives. The patient understands, agrees and consents for the procedure. All questions were addressed. A time out was performed prior to the initiation of the procedure. Maximal barrier sterile technique utilized including caps, mask, sterile gowns, sterile gloves, large sterile drape, hand hygiene, and Betadine prep. The right groin was prepped and draped in a sterile fashion. 1% lidocaine was utilized for local anesthesia. Under sonographic guidance, a micropuncture needle was inserted into the right common femoral artery and removed over a 018 wire. This was up sized to a Bentson. The common femoral artery was noted to be patent. Sonographic documentation was obtained A 5 French sheath was inserted. A 5 French pigtail catheter was advanced into the aorta. Lateral abdominal aortography was performed for the origins of the celiac and SMA. The catheter was retracted. RA 0 distal abdominal aortography was performed for the origin of the IMA. The pigtail was exchanged for a cobra 2 catheter. The celiac was selected. Angiography was performed. This was advanced  over a glidewire into the common hepatic artery. Angiography was performed. A Renegade microcatheter was advanced over a 016 fathom wire into the gastroduodenal artery and angiography was performed. The catheter was then advanced over the wire into the distal gastroduodenal artery. Embolization was performed utilizing 3 mm and 4 mm  diameter interlock coils for total embolization of the gastroduodenal artery. The microcatheter was retracted into the common hepatic artery and repeat angiography was performed. The Cobra 2 catheter was aspirated and flushed. It was retracted into the abdominal aorta. It was advanced over a Bentson wire into the SMA and angiography was performed. It was then retracted and advanced over a Bentson wire into the IMA and angiography was performed. The catheter was then advanced into the right external iliac artery and right RA 0 femoral angiography was performed. The sheath was flushed and sewn in place.  The patient's INR is 2.1. FINDINGS: Lateral abdominal aortography demonstrates patency of the celiac and SMA. RA 0 distal abdominal aortography demonstrates patency of the origin of the IMA. Celiac angiography delineates anatomy. There is no evidence of active bleeding within the territory of the gastroduodenal artery or left gastric artery Selective angiography of the common hepatic artery demonstrates patency of the gastroduodenal artery. Subsequent images demonstrate coil embolization of the gastroduodenal artery. 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 Injection of the SMA demonstrates patency of the branches but no source of active bleeding. Angiography of the IMA over the pelvis and left upper quadrant demonstrates no evidence of bleeding from the descending or sigmoid colon. IMPRESSION: Angiography of the celiac, SMA, and IMA demonstrates no active source of bleeding. Empiric embolization of the gastroduodenal artery was performed. The right femoral artery sheath was sewn in place in preparation for sheath removal after the INR is normalized. Electronically Signed   By: Marybelle Killings M.D.   On: 11/11/2017 14:28   Ir Angiogram Visceral Selective  Result Date: 11/11/2017 INDICATION: Gastrointestinal bleeding EXAM: MESENTERIC ANGIOGRAM AND  GASTRODUODENAL ARTERY EMBOLIZATION. MEDICATIONS: NONE ANESTHESIA/SEDATION: Moderate (conscious) sedation was employed during this procedure. A total of Versed 1 mg and Fentanyl 50 mcg was administered intravenously. Moderate Sedation Time: 76 minutes. The patient's level of consciousness and vital signs were monitored continuously by radiology nursing throughout the procedure under my direct supervision. CONTRAST:  170 CC ISOVUE-300 FLUOROSCOPY TIME:  Fluoroscopy Time: 13 minutes 30 seconds (815 mGy). COMPLICATIONS: None immediate. PROCEDURE: Informed consent was obtained from the patient following explanation of the procedure, risks, benefits and alternatives. The patient understands, agrees and consents for the procedure. All questions were addressed. A time out was performed prior to the initiation of the procedure. Maximal barrier sterile technique utilized including caps, mask, sterile gowns, sterile gloves, large sterile drape, hand hygiene, and Betadine prep. The right groin was prepped and draped in a sterile fashion. 1% lidocaine was utilized for local anesthesia. Under sonographic guidance, a micropuncture needle was inserted into the right common femoral artery and removed over a 018 wire. This was up sized to a Bentson. The common femoral artery was noted to be patent. Sonographic documentation was obtained A 5 French sheath was inserted. A 5 French pigtail catheter was advanced into the aorta. Lateral abdominal aortography was performed for the origins of the celiac and SMA. The catheter was retracted. RA 0 distal abdominal aortography was performed for the origin of the IMA. The pigtail was exchanged for a cobra 2 catheter. The celiac was selected. Angiography was performed.  This was advanced over a glidewire into the common hepatic artery. Angiography was performed. A Renegade microcatheter was advanced over a 016 fathom wire into the gastroduodenal artery and angiography was performed. The catheter  was then advanced over the wire into the distal gastroduodenal artery. Embolization was performed utilizing 3 mm and 4 mm diameter interlock coils for total embolization of the gastroduodenal artery. The microcatheter was retracted into the common hepatic artery and repeat angiography was performed. The Cobra 2 catheter was aspirated and flushed. It was retracted into the abdominal aorta. It was advanced over a Bentson wire into the SMA and angiography was performed. It was then retracted and advanced over a Bentson wire into the IMA and angiography was performed. The catheter was then advanced into the right external iliac artery and right RA 0 femoral angiography was performed. The sheath was flushed and sewn in place.  The patient's INR is 2.1. FINDINGS: Lateral abdominal aortography demonstrates patency of the celiac and SMA. RA 0 distal abdominal aortography demonstrates patency of the origin of the IMA. Celiac angiography delineates anatomy. There is no evidence of active bleeding within the territory of the gastroduodenal artery or left gastric artery Selective angiography of the common hepatic artery demonstrates patency of the gastroduodenal artery. Subsequent images demonstrate coil embolization of the gastroduodenal artery. 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 Injection of the SMA demonstrates patency of the branches but no source of active bleeding. Angiography of the IMA over the pelvis and left upper quadrant demonstrates no evidence of bleeding from the descending or sigmoid colon. IMPRESSION: Angiography of the celiac, SMA, and IMA demonstrates no active source of bleeding. Empiric embolization of the gastroduodenal artery was performed. The right femoral artery sheath was sewn in place in preparation for sheath removal after the INR is normalized. Electronically Signed   By: Marybelle Killings M.D.   On: 11/11/2017 14:28   Ir Angiogram  Visceral Selective  Result Date: 11/11/2017 INDICATION: Gastrointestinal bleeding EXAM: MESENTERIC ANGIOGRAM AND GASTRODUODENAL ARTERY EMBOLIZATION. MEDICATIONS: NONE ANESTHESIA/SEDATION: Moderate (conscious) sedation was employed during this procedure. A total of Versed 1 mg and Fentanyl 50 mcg was administered intravenously. Moderate Sedation Time: 76 minutes. The patient's level of consciousness and vital signs were monitored continuously by radiology nursing throughout the procedure under my direct supervision. CONTRAST:  170 CC ISOVUE-300 FLUOROSCOPY TIME:  Fluoroscopy Time: 13 minutes 30 seconds (815 mGy). COMPLICATIONS: None immediate. PROCEDURE: Informed consent was obtained from the patient following explanation of the procedure, risks, benefits and alternatives. The patient understands, agrees and consents for the procedure. All questions were addressed. A time out was performed prior to the initiation of the procedure. Maximal barrier sterile technique utilized including caps, mask, sterile gowns, sterile gloves, large sterile drape, hand hygiene, and Betadine prep. The right groin was prepped and draped in a sterile fashion. 1% lidocaine was utilized for local anesthesia. Under sonographic guidance, a micropuncture needle was inserted into the right common femoral artery and removed over a 018 wire. This was up sized to a Bentson. The common femoral artery was noted to be patent. Sonographic documentation was obtained A 5 French sheath was inserted. A 5 French pigtail catheter was advanced into the aorta. Lateral abdominal aortography was performed for the origins of the celiac and SMA. The catheter was retracted. RA 0 distal abdominal aortography was performed for the origin of the IMA. The pigtail was exchanged for a cobra 2 catheter. The celiac was selected.  Angiography was performed. This was advanced over a glidewire into the common hepatic artery. Angiography was performed. A Renegade  microcatheter was advanced over a 016 fathom wire into the gastroduodenal artery and angiography was performed. The catheter was then advanced over the wire into the distal gastroduodenal artery. Embolization was performed utilizing 3 mm and 4 mm diameter interlock coils for total embolization of the gastroduodenal artery. The microcatheter was retracted into the common hepatic artery and repeat angiography was performed. The Cobra 2 catheter was aspirated and flushed. It was retracted into the abdominal aorta. It was advanced over a Bentson wire into the SMA and angiography was performed. It was then retracted and advanced over a Bentson wire into the IMA and angiography was performed. The catheter was then advanced into the right external iliac artery and right RA 0 femoral angiography was performed. The sheath was flushed and sewn in place.  The patient's INR is 2.1. FINDINGS: Lateral abdominal aortography demonstrates patency of the celiac and SMA. RA 0 distal abdominal aortography demonstrates patency of the origin of the IMA. Celiac angiography delineates anatomy. There is no evidence of active bleeding within the territory of the gastroduodenal artery or left gastric artery Selective angiography of the common hepatic artery demonstrates patency of the gastroduodenal artery. Subsequent images demonstrate coil embolization of the gastroduodenal artery. 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 Injection of the SMA demonstrates patency of the branches but no source of active bleeding. Angiography of the IMA over the pelvis and left upper quadrant demonstrates no evidence of bleeding from the descending or sigmoid colon. IMPRESSION: Angiography of the celiac, SMA, and IMA demonstrates no active source of bleeding. Empiric embolization of the gastroduodenal artery was performed. The right femoral artery sheath was sewn in place in preparation for sheath  removal after the INR is normalized. Electronically Signed   By: Marybelle Killings M.D.   On: 11/11/2017 14:28   Ir Angiogram Selective Each Additional Vessel  Result Date: 11/11/2017 INDICATION: Gastrointestinal bleeding EXAM: MESENTERIC ANGIOGRAM AND GASTRODUODENAL ARTERY EMBOLIZATION. MEDICATIONS: NONE ANESTHESIA/SEDATION: Moderate (conscious) sedation was employed during this procedure. A total of Versed 1 mg and Fentanyl 50 mcg was administered intravenously. Moderate Sedation Time: 76 minutes. The patient's level of consciousness and vital signs were monitored continuously by radiology nursing throughout the procedure under my direct supervision. CONTRAST:  170 CC ISOVUE-300 FLUOROSCOPY TIME:  Fluoroscopy Time: 13 minutes 30 seconds (815 mGy). COMPLICATIONS: None immediate. PROCEDURE: Informed consent was obtained from the patient following explanation of the procedure, risks, benefits and alternatives. The patient understands, agrees and consents for the procedure. All questions were addressed. A time out was performed prior to the initiation of the procedure. Maximal barrier sterile technique utilized including caps, mask, sterile gowns, sterile gloves, large sterile drape, hand hygiene, and Betadine prep. The right groin was prepped and draped in a sterile fashion. 1% lidocaine was utilized for local anesthesia. Under sonographic guidance, a micropuncture needle was inserted into the right common femoral artery and removed over a 018 wire. This was up sized to a Bentson. The common femoral artery was noted to be patent. Sonographic documentation was obtained A 5 French sheath was inserted. A 5 French pigtail catheter was advanced into the aorta. Lateral abdominal aortography was performed for the origins of the celiac and SMA. The catheter was retracted. RA 0 distal abdominal aortography was performed for the origin of the IMA. The pigtail was exchanged for a cobra 2  catheter. The celiac was selected.  Angiography was performed. This was advanced over a glidewire into the common hepatic artery. Angiography was performed. A Renegade microcatheter was advanced over a 016 fathom wire into the gastroduodenal artery and angiography was performed. The catheter was then advanced over the wire into the distal gastroduodenal artery. Embolization was performed utilizing 3 mm and 4 mm diameter interlock coils for total embolization of the gastroduodenal artery. The microcatheter was retracted into the common hepatic artery and repeat angiography was performed. The Cobra 2 catheter was aspirated and flushed. It was retracted into the abdominal aorta. It was advanced over a Bentson wire into the SMA and angiography was performed. It was then retracted and advanced over a Bentson wire into the IMA and angiography was performed. The catheter was then advanced into the right external iliac artery and right RA 0 femoral angiography was performed. The sheath was flushed and sewn in place.  The patient's INR is 2.1. FINDINGS: Lateral abdominal aortography demonstrates patency of the celiac and SMA. RA 0 distal abdominal aortography demonstrates patency of the origin of the IMA. Celiac angiography delineates anatomy. There is no evidence of active bleeding within the territory of the gastroduodenal artery or left gastric artery Selective angiography of the common hepatic artery demonstrates patency of the gastroduodenal artery. Subsequent images demonstrate coil embolization of the gastroduodenal artery. 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 Injection of the SMA demonstrates patency of the branches but no source of active bleeding. Angiography of the IMA over the pelvis and left upper quadrant demonstrates no evidence of bleeding from the descending or sigmoid colon. IMPRESSION: Angiography of the celiac, SMA, and IMA demonstrates no active source of bleeding. Empiric  embolization of the gastroduodenal artery was performed. The right femoral artery sheath was sewn in place in preparation for sheath removal after the INR is normalized. Electronically Signed   By: Marybelle Killings M.D.   On: 11/11/2017 14:28   Ir US Guide Vasc Access Right  Result Date: 11/11/2017 INDICATION: Gastrointestinal bleeding EXAM: MESENTERIC ANGIOGRAM AND GASTRODUODENAL ARTERY EMBOLIZATION. MEDICATIONS: NONE ANESTHESIA/SEDATION: Moderate (conscious) sedation was employed during this procedure. A total of Versed 1 mg and Fentanyl 50 mcg was administered intravenously. Moderate Sedation Time: 76 minutes. The patient's level of consciousness and vital signs were monitored continuously by radiology nursing throughout the procedure under my direct supervision. CONTRAST:  170 CC [id number] FLUOROSCOPY TIME:  Fluoroscopy Time: 13 minutes 30 seconds (815 mGy). COMPLICATIONS: None immediate. PROCEDURE: Informed consent was obtained from the patient following explanation of the procedure, risks, benefits and alternatives. The patient understands, agrees and consents for the procedure. All questions were addressed. A time out was performed prior to the initiation of the procedure. Maximal barrier sterile technique utilized including caps, mask, sterile gowns, sterile gloves, large sterile drape, hand hygiene, and Betadine prep. The right groin was prepped and draped in a sterile fashion. 1% lidocaine was utilized for local anesthesia. Under sonographic guidance, a micropuncture needle was inserted into the right common femoral artery and removed over a 018 wire. This was up sized to a Bentson. The common femoral artery was noted to be patent. Sonographic documentation was obtained A 5 French sheath was inserted. A 5 French pigtail catheter was advanced into the aorta. Lateral abdominal aortography was performed for the origins of the celiac and SMA. The catheter was retracted. RA 0 distal abdominal aortography was  performed for the origin of the IMA. The pigtail  was exchanged for a cobra 2 catheter. The celiac was selected. Angiography was performed. This was advanced over a glidewire into the common hepatic artery. Angiography was performed. A Renegade microcatheter was advanced over a 016 fathom wire into the gastroduodenal artery and angiography was performed. The catheter was then advanced over the wire into the distal gastroduodenal artery. Embolization was performed utilizing 3 mm and 4 mm diameter interlock coils for total embolization of the gastroduodenal artery. The microcatheter was retracted into the common hepatic artery and repeat angiography was performed. The Cobra 2 catheter was aspirated and flushed. It was retracted into the abdominal aorta. It was advanced over a Bentson wire into the SMA and angiography was performed. It was then retracted and advanced over a Bentson wire into the IMA and angiography was performed. The catheter was then advanced into the right external iliac artery and right RA 0 femoral angiography was performed. The sheath was flushed and sewn in place.  The patient's INR is 2.1. FINDINGS: Lateral abdominal aortography demonstrates patency of the celiac and SMA. RA 0 distal abdominal aortography demonstrates patency of the origin of the IMA. Celiac angiography delineates anatomy. There is no evidence of active bleeding within the territory of the gastroduodenal artery or left gastric artery Selective angiography of the common hepatic artery demonstrates patency of the gastroduodenal artery. Subsequent images demonstrate coil embolization of the gastroduodenal artery. 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 Injection of the SMA demonstrates patency of the branches but no source of active bleeding. Angiography of the IMA over the pelvis and left upper quadrant demonstrates no evidence of bleeding from the descending or sigmoid  colon. IMPRESSION: Angiography of the celiac, SMA, and IMA demonstrates no active source of bleeding. Empiric embolization of the gastroduodenal artery was performed. The right femoral artery sheath was sewn in place in preparation for sheath removal after the INR is normalized. Electronically Signed   By: Marybelle Killings M.D.   On: 11/11/2017 14:28    Medications: I have reviewed the patient's current medications.  Assessment: 1.Transient small bowel bleed noted on bleeding scan EGD had shown gastric ulcers as well as 3 large duodenal ulcers with pigmented material. Patient has had embolization of GDA. Hemoglobin dropped from 7.7-7.5 and is to receive 2 units of PRBC today. Episode of liquid dark stool today morning could be old  blood versus continued bleeding BUN/creatinine down from 18 to 14 INR 1.19 today     2. Mechanical aortic valve and atrial fibrillation currently not on anticoagulation 3. Nonischemic cardiomyopathy with AICD and EF of 40%   Plan: Would hold heparin as well as warfarin unless there is no signs of GI bleed for at least 24 hours. Agree with 2 unit PRBC transfusion, recommend H&H 4 hours post transfusion completion. Patient is on pantoprazole 76m/hr IV as well as ferrous sulfate.  It is unclear whether liquid black stool today morning was old blood, versus discoloration of stool with ferrous sulfate use, versus active bleeding. Patient was not tachycardic or hypotensive. Recommend continue monitoring and transfusing as needed.   ARonnette Juniper9/29/2019, 10:09 AM   Pager [phone] no answer or after 5 PM call [phone]

## 2017-11-12 NOTE — Progress Notes (Signed)
Pt was identified by name and d.o.b. Prolene suture was removed and 5Fr exoseal was deployed/5Fr sheath was removed from right groin at 1039 hrs. Manual pressure was held for 11 min and hemostasis was achieved at 1050 hrs. Hemostasis/distal pulses was verified with Josh A RTR, Coy H RTR, and [employer]N.

## 2017-11-12 NOTE — Progress Notes (Signed)
[place] for Heparin (on hold) Indication: Mechanical AVR + Afib  No Known Allergies  Patient Measurements: Height: 5\' 8"  (172.7 cm) Weight: 197 lb 8.5 oz (89.6 kg) IBW/kg (Calculated) : 68.4 Heparin Dosing Weight: 86.4 kg  Vital Signs: Temp: 98 F (36.7 C) (09/29 0915) Temp Source: Oral (09/29 0915) BP: 112/67 (09/29 1058) Pulse Rate: 90 (09/29 1058)  Labs: Recent Labs    11/10/17 0435  11/10/17 1752  11/11/17 0337 11/11/17 1819 11/12/17 0500  HGB 6.7*   < >  --    < > 8.1* 7.7* 7.5*  HCT 21.1*   < >  --    < > 25.1* 23.4* 23.4*  PLT 266  --   --   --  229  --  222  LABPROT 18.8*  --  22.5*  --  23.9*  --  15.0  INR 1.59  --  2.00  --  2.16  --  1.19  HEPARINUNFRC 0.43  --   --   --   --   --   --   CREATININE 1.04  --   --   --  0.97  --  0.88   < > = values in this interval not displayed.    Estimated Creatinine Clearance: 76.5 mL/min (by C-G formula based on SCr of 0.88 mg/dL).   Medical History: Past Medical History:  Diagnosis Date  . AICD (automatic cardioverter/defibrillator) present 07/11/2012  . Aortic aneurysm, thoracic ([place])    Fusiform s/p Bentall procedure 2002  . Aortic valve, bicuspid    Mechanical AVR  . Basal cell carcinoma of face    "burned off" (11/06/2017)  . BPH (benign prostatic hypertrophy)   . CHF (congestive heart failure) ([place])    "once" (11/06/2017)  . Coronary atherosclerosis of native coronary artery    Nonobstructive at cardiac catherization 2002  . Essential hypertension, benign   . Left bundle branch block   . Mixed hyperlipidemia   . Nonischemic cardiomyopathy (HCC)    CRT-D  . Persistent atrial fibrillation ([place])   . Presence of permanent cardiac pacemaker     Assessment: 74 YOF who presented on 9/22 with ICD shocks and concern for lower GIB. The patient was on warfarin PTA for hx mech AVR + Afib. Admit INR elevated at 8.35 in the setting of recent outpatient Bactrim on PTA  dosing of 5 mg daily.   S/p embolization of GDA in IR 9/27 PM. Anticoagulation is now on hold in the setting of GI bleed. Team aware of risk but given bleeding, unable to resume anticoagulation. CBC noted.   9/29: Sheath pulled this AM. Patient had another medium dark bowel movement. GI unsure if this old blood. H/H low stable.    Goal of Therapy:  Heparin goal 0.3-0.5 INR goal 2.5-3.5 Monitor platelets by anticoagulation protocol: Yes   Plan:  Continue to hold IV heparin for another 24 hours per GI recommendations  F/u anticoag plans tomorrow   [name], PharmD., BCPS Clinical Pharmacist Clinical phone for 11/12/17 until 8:30pm: [phone] If after 3:30pm, please refer to [facility] for unit-specific pharmacist

## 2017-11-13 ENCOUNTER — Encounter (HOSPITAL_COMMUNITY): Payer: Self-pay | Admitting: Radiology

## 2017-11-13 LAB — TYPE AND SCREEN
ABO/RH(D): A POS
Antibody Screen: NEGATIVE
Unit division: 0
Unit division: 0
Unit division: 0
Unit division: 0
Unit division: 0

## 2017-11-13 LAB — PREPARE FRESH FROZEN PLASMA: Unit division: 0

## 2017-11-13 LAB — BPAM RBC
Blood Product Expiration Date: 201910132359
Blood Product Expiration Date: 201910132359
Blood Product Expiration Date: 201910222359
Blood Product Expiration Date: 201910222359
Blood Product Expiration Date: 201910252359
ISSUE DATE / TIME: 201909270850
ISSUE DATE / TIME: 201909271139
ISSUE DATE / TIME: 201909271811
ISSUE DATE / TIME: 201909290854
ISSUE DATE / TIME: 201909291348
Unit Type and Rh: 6200
Unit Type and Rh: 6200
Unit Type and Rh: 6200
Unit Type and Rh: 6200
Unit Type and Rh: 6200

## 2017-11-13 LAB — CBC
HCT: 29.9 % — ABNORMAL LOW (ref 39.0–52.0)
Hemoglobin: 9.5 g/dL — ABNORMAL LOW (ref 13.0–17.0)
MCH: 29.1 pg (ref 26.0–34.0)
MCHC: 31.8 g/dL (ref 30.0–36.0)
MCV: 91.7 fL (ref 78.0–100.0)
PLATELETS: 215 10*3/uL (ref 150–400)
RBC: 3.26 MIL/uL — AB (ref 4.22–5.81)
RDW: 17.6 % — AB (ref 11.5–15.5)
WBC: 8.9 10*3/uL (ref 4.0–10.5)

## 2017-11-13 LAB — BPAM FFP
BLOOD PRODUCT EXPIRATION DATE: 201909292359
Unit Type and Rh: 6200

## 2017-11-13 MED ORDER — SUCRALFATE 1 G PO TABS
1.0000 g | ORAL_TABLET | Freq: Three times a day (TID) | ORAL | Status: DC
  Administered 2017-11-13 – 2017-11-18 (×21): 1 g via ORAL
  Filled 2017-11-13 (×23): qty 1

## 2017-11-13 NOTE — Progress Notes (Signed)
[facility] PROGRESS NOTE Subjective Patient feels fine.  He has had stool but it is dark.  No further gross bleeding.  9/25 he had EGD and colonoscopy with polyps removed.  He was found to have multiple gastric and duodenal ulcers.  He continued to bleed leading to embolization of the gastroduodenal artery 9/27.  There was not any clear bleeding seen at that time.  He has a mechanical heart valve and is been on Coumadin for some time.  He had AVR in 2002 with mechanical valve with subsequent chronic A. fib.  He has an ICD placed and apparently had multiple shocks requiring him to go to the [facility] emergency room.  He was having abdomenal pain and some GI bleeding.  With his recurrent bleeding with positive bleeding scan in the proximal bowel is felt that he is likely bleeding from the large duodenal and gastric ulcers that he has.  He is on a pantoprazole infusion.  Objective: Vital signs in last 24 hours: Temp:  [97.5 F (36.4 C)-98.5 F (36.9 C)] 97.6 F (36.4 C) (09/30 0756) Pulse Rate:  [83-117] 90 (09/30 0903) Resp:  [13-26] 20 (09/30 0800) BP: (87-143)/(56-118) 111/65 (09/30 0903) SpO2:  [94 %-99 %] 97 % (09/30 0800) Weight:  [89.9 kg] 89.9 kg (09/30 0329) Last BM Date: 11/12/17  Intake/Output from previous day: 09/29 0701 - 09/30 0700 In: 1362.1 [I.V.:632.2; Blood:630; IV Piggyback:99.9] Out: 1248 [[id number]] Intake/Output this shift: No intake/output data recorded.  PE: General--in no distress denies abdominal pain eating breakfast  Abdomen--nontender  Lab Results: Recent Labs    11/11/17 0337 11/11/17 1819 11/12/17 0500 11/12/17 2133 11/13/17 0356  WBC 12.6*  --  10.7*  --  8.9  HGB 8.1* 7.7* 7.5* 9.9* 9.5*  HCT 25.1* 23.4* 23.4* 31.3* 29.9*  PLT 229  --  222  --  215   BMET Recent Labs    11/11/17 0337 11/12/17 0500  NA 136 135  K 4.0 3.9  CL 110 107  CO2 20* 21*  CREATININE 0.97 0.88   LFT Recent Labs    11/12/17 0500  PROT  4.6*  AST 56*  ALT 46*  ALKPHOS 53  BILITOT 0.7   PT/INR Recent Labs    11/10/17 1752 11/11/17 0337 11/12/17 0500  LABPROT 22.5* 23.9* 15.0  INR 2.00 2.16 1.19   PANCREAS No results for input(s): LIPASE in the last 72 hours.       Studies/Results: No results found.  Medications: I have reviewed the patient's current medications.  Assessment:   1.  GI bleeding.  This is certainly due to theMarked peptic ulcer disease.  Hopefully the empiric embolization will help.  He seems to be stable now off of anticoagulation.  He is on a Protonix drip.  He may benefit from the addition of Carafate.   Plan: 1.  We will go ahead and add Carafate before meals and at bedtime to his medical regimen.  Would keep him now on Protonix drip.  If his hemoglobin remains stable, we could consider starting him back on a heparin drip.  I would like to see his hemoglobin stable on a heparin drip for a few days prior to resuming Coumadin.   Nancy Fetter 11/13/2017, 9:53 AM  This note was created using voice recognition software. Minor errors may Have occurred unintentionally.  Pager: [phone] If no answer or after hours call [phone]

## 2017-11-13 NOTE — Progress Notes (Signed)
Progress Note  Patient Name: Timothy WILHELMSEN Sr. Date of Encounter: 11/13/2017  Primary Cardiologist: Rozann Lesches, MD  Subjective   Feels well today, without significant chest discomfort, palpitations, or shortness of breath.  He was able to eat breakfast without abdominal discomfort.  He himself has not noticed any bright red blood or frank bleeding.  Continues to have dark stool.  Inpatient Medications    Scheduled Meds: . atorvastatin  10 mg Oral q1800  . cholecalciferol  1,000 Units Oral Daily  . ferrous sulfate  325 mg Oral QODAY  . metoprolol succinate  100 mg Oral BID  . omega-3 acid ethyl esters  1,000 mg Oral Daily  . [START ON 11/14/2017] pantoprazole  40 mg Intravenous Q12H  . sucralfate  1 g Oral TID WC & HS  . tamsulosin  0.4 mg Oral QHS  . vitamin C  500 mg Oral Daily   Continuous Infusions: . sodium chloride Stopped (11/12/17 2156)  . cefTRIAXone (ROCEPHIN)  IV Stopped (11/12/17 2227)  . pantoprozole (PROTONIX) infusion Stopped (11/13/17 1142)   PRN Meds: sodium chloride, acetaminophen, meclizine, senna-docusate, traMADol   Vital Signs    Vitals:   11/13/17 0900 11/13/17 0903 11/13/17 1000 11/13/17 1135  BP: 111/65 111/65 120/72   Pulse: 86 90 86   Resp: 19  18   Temp:    97.6 F (36.4 C)  TempSrc:    Oral  SpO2: 94%  97%   Weight:      Height:        Intake/Output Summary (Last 24 hours) at 11/13/2017 1144 Last data filed at 11/13/2017 0800 Gross per 24 hour  Intake 980.41 ml  Output 832 ml  Net 148.41 ml   Filed Weights   11/10/17 2100 11/11/17 0341 11/13/17 0329  Weight: 90.5 kg 89.6 kg 89.9 kg    Telemetry    Intermittent biventricular pacing with wide complexes possibly representing afib with aberrant conduction- Personally Reviewed  ECG    Pending  Physical Exam   GEN: No acute distress, sitting up comfortably in bedside chair.   Neck: No JVD Cardiac: irregular rhythm, rate 80s-90s. Crisp mechanical valve closure sound  at S2. No murmurs.  Respiratory: Clear to auscultation bilaterally. GI: Soft, nontender, non-distended  MS: No edema; No deformity. Neuro:  Nonfocal  Psych: Normal affect   Labs    Chemistry Recent Labs  Lab 11/07/17 0517 11/08/17 0427  11/10/17 0435 11/11/17 0337 11/12/17 0500  NA 140 139   < > 138 136 135  K 3.7 4.1   < > 4.4 4.0 3.9  CL 108 107   < > 109 110 107  CO2 25 25   < > 24 20* 21*  GLUCOSE 116* 99   < > 113* 105* 96  BUN 22 15   < > 19 18 14   CREATININE 1.08 1.02   < > 1.04 0.97 0.88  CALCIUM 8.2* 8.2*   < > 7.8* 7.5* 7.6*  PROT 5.6* 5.6*  --   --   --  4.6*  ALBUMIN 2.9* 2.9*  --   --   --  2.5*  AST 137* 107*  --   --   --  56*  ALT 79* 69*  --   --   --  46*  ALKPHOS 67 67  --   --   --  53  BILITOT 1.0 1.2  --   --   --  0.7  GFRNONAA >60 >60   < > >  60 >60 >60  GFRAA >60 >60   < > >60 >60 >60  ANIONGAP 7 7   < > 5 6 7    < > = values in this interval not displayed.     Hematology Recent Labs  Lab 11/11/17 0337  11/12/17 0500 11/12/17 2133 11/13/17 0356  WBC 12.6*  --  10.7*  --  8.9  RBC 2.75*  --  2.48*  --  3.26*  HGB 8.1*   < > 7.5* 9.9* 9.5*  HCT 25.1*   < > 23.4* 31.3* 29.9*  MCV 91.3  --  94.4  --  91.7  MCH 29.5  --  30.2  --  29.1  MCHC 32.3  --  32.1  --  31.8  RDW 15.9*  --  16.3*  --  17.6*  PLT 229  --  222  --  215   < > = values in this interval not displayed.    Cardiac EnzymesNo results for input(s): TROPONINI in the last 168 hours. No results for input(s): TROPIPOC in the last 168 hours.   BNPNo results for input(s): BNP, PROBNP in the last 168 hours.   DDimer No results for input(s): DDIMER in the last 168 hours.   Radiology    No results found.  Cardiac Studies   Echocardiogram: - Left ventricle: The cavity size was normal. Systolic function was   mildly to moderately reduced. The estimated ejection fraction was   in the range of 40% to 45%. - Aortic valve: Mildly calcified annulus. Moderately thickened,    moderately calcified leaflets. There was moderate stenosis. Valve   area (VTI): 1.59 cm^2. Valve area (Vmax): 1.61 cm^2. Valve area   (Vmean): 1.34 cm^2. - Mitral valve: There was mild regurgitation. - Left atrium: The atrium was moderately dilated. - Right atrium: The atrium was mildly dilated. - Pulmonary arteries: Systolic pressure was mildly increased. PA   peak pressure: 34 mm Hg (S).  Patient Profile     77 y.o. malewith a hx of aortic aneurysm status post Bentall procedure in 2002 with mechanical AVR, coronary artery disease nonobstructive in 2002, hypertension, hyperlipidemia, nonischemic cardiomyopathy status post [employer] CRT-D in 2014, and persistent atrial fibrillationwho was admitted after multipleinappropriate ICD shocks, due to rapid afib.  Patient found to have a GI bleed.  He was found to have duodenal ulcers, polyps and diverticulosis with GI evaluation.  Recurrent bleeding and nuclear scan showed bleeding from proximal small bowel versus duodenum.  Had empiric embolization of GDA. Last echocardiogram September 2019 showed ejection fraction 40 to 45%, mechanical aortic valve with mean gradient 15 mmHg, mild mitral regurgitation and biatrial enlargement.  Assessment & Plan    Principal Problem:   GI bleed Active Problems:   Essential hypertension, benign   Atrial fibrillation with rapid ventricular response (HCC) -status post cardioversion   S/P aortic valve replacement with metallic valve   Chronic combined systolic and diastolic heart failure (HCC)   ICD (implantable cardioverter-defibrillator) in place   Supratherapeutic INR   Acute blood loss anemia  Mr. Timothy Fritz has received blood products and has had an appropriate rise in his hemoglobin.  I had the opportunity to discuss his care with Dr. Laurence Spates from gastroenterology this morning.  He will be important to monitor the patient for bleeding or drop in hemoglobin today.  If he tolerates without signs of blood  loss in the next 24 hours, we could consider cautious reinitiation of heparin infusion tomorrow or the following  day, and subsequently he will require monitoring on heparin in hospital for signs of bleeding prior to dismissal from the hospital on warfarin.  Currently has mechanical valve has crisp valve closure sound, and given that the patient feels well, there are no concerns for acute thrombosis at the moment.  He is currently relatively well rate controlled, and has no signs of worsening heart failure.  It is likely he will require EP follow-up outside of the hospital to determine next best steps for atrial fibrillation as well as CRT in the setting of a nonischemic cardiomyopathy.  Cardiology will follow along.  For questions or updates, please contact [place] Please consult [email or web address] for contact info under        Signed, Elouise Munroe, MD  11/13/2017, 11:44 AM

## 2017-11-13 NOTE — Progress Notes (Signed)
PROGRESS NOTE        PATIENT DETAILS Name: Timothy CARRENO Sr. Age: 77 y.o. Sex: male Date of Birth: 1940-06-09 Admit Date: 11/05/2017 Admitting Physician Desiree Hane, MD [id number], Ashish, MD  Brief Narrative: Patient is a 77 y.o. male with history of mechanical aortic valve, A. fib on warfarin, nonischemic cardiomyopathy with ICD in place-presented with several days of melena.  EGD on 9/25 showed 3 large nonbleeding ulcers with discoloration at the bases, colonoscopy on 9/25 showed 2 polyps that were removed.  Unfortunately continues to have intermittent GI bleeding during this hospital stay requiring a total of 5 units of PRBC.  GI, cardiology following-anticoagulation remains on hold till GI bleeding has completely resolved.  See below for further details  Subjective: Seen earlier this morning-he had not had a bowel movement yet-his last bowel movement was last evening-it was black with some fresh blood in it.  Assessment/Plan: Upper GI bleeding with acute blood loss anemia: GI bleeding seems to have slowed down-not sure if it is completely resolved at this time.  EGD on 9/25 showed 3 large nonbleeding ulcers with pigmented basis, patient also underwent embolization of gastroduodenal artery.  Continue PPI infusion-appreciate GI input-hopefully if he stabilizes and there is no evidence of recurrent GI bleeding-we could resume IV heparin.  Follow CBC-stable this morning after 2 units of PRBC yesterday.  Atrial fibrillation: Rate controlled on anticoagulation on hold due to GI bleed-continue metoprolol  Mechanical aortic valve replacement: See above-very difficult situation due to ongoing GI bleeding-anticoagulation on hold.  No evidence of any thrombotic event at this time-however patient aware of risk as he is off anticoagulation.  Chronic combined systolic/diastolic heart failure (EF 40-45% by TTE on 11/06/2017): Appears euvolemic on exam.  Diuretics remain on  hold.  Follow volume status closely.  Hypertension: BP controlled-antihypertensives on hold due to ongoing GI bleeding.  Resume when able.  DVT Prophylaxis: SCD's  Code Status: Full code   Family Communication: None at bedside  Disposition Plan: Remain inpatient-requires several days of hospitalization before consideration of discharge.  Antimicrobial agents: Anti-infectives (From admission, onward)   Start     Dose/Rate Route Frequency Ordered Stop   11/10/17 2200  cefTRIAXone (ROCEPHIN) 1 g in sodium chloride 0.9 % 100 mL IVPB     1 g 200 mL/hr over 30 Minutes Intravenous Every 24 hours 11/10/17 2146 11/15/17 2159      Procedures: 9/27>> mesenteric angiogram embolization of GDA  9/25>> EGD/colonoscopy  9/23>> echocardiogram: - Left ventricle: The cavity size was normal. Systolic function was   mildly to moderately reduced. The estimated ejection fraction was   in the range of 40% to 45%. - Aortic valve: Mildly calcified annulus. Moderately thickened,   moderately calcified leaflets. There was moderate stenosis. Valve   area (VTI): 1.59 cm^2. Valve area (Vmax): 1.61 cm^2. Valve area   (Vmean): 1.34 cm^2. - Mitral valve: There was mild regurgitation. - Left atrium: The atrium was moderately dilated. - Right atrium: The atrium was mildly dilated. - Pulmonary arteries: Systolic pressure was mildly increased. PA   peak pressure: 34 mm Hg (S).  CONSULTS:  cardiology and GI  Time spent: 25- minutes-Greater than 50% of this time was spent in counseling, explanation of diagnosis, planning of further management, and coordination of care.  MEDICATIONS: Scheduled Meds: . atorvastatin  10 mg Oral q1800  .  cholecalciferol  1,000 Units Oral Daily  . ferrous sulfate  325 mg Oral QODAY  . metoprolol succinate  100 mg Oral BID  . omega-3 acid ethyl esters  1,000 mg Oral Daily  . [START ON 11/14/2017] pantoprazole  40 mg Intravenous Q12H  . sucralfate  1 g Oral TID WC & HS  .  tamsulosin  0.4 mg Oral QHS  . vitamin C  500 mg Oral Daily   Continuous Infusions: . sodium chloride Stopped (11/12/17 2156)  . cefTRIAXone (ROCEPHIN)  IV Stopped (11/12/17 2227)   PRN Meds:.sodium chloride, acetaminophen, meclizine, senna-docusate, traMADol   PHYSICAL EXAM: Vital signs: Vitals:   11/13/17 1000 11/13/17 1100 11/13/17 1135 11/13/17 1200  BP: 120/72 113/62  113/82  Pulse: 86 89  78  Resp: 18 20  18   Temp:   97.6 F (36.4 C)   TempSrc:   Oral   SpO2: 97% 96%  99%  Weight:      Height:       Filed Weights   11/10/17 2100 11/11/17 0341 11/13/17 0329  Weight: 90.5 kg 89.6 kg 89.9 kg   Body mass index is 30.14 kg/m.   General appearance :Awake, alert, not in any distress.  Eyes: Pink conjunctiva HEENT: Atraumatic and Normocephalic Neck: supple Resp:Good air entry bilaterally, no added sounds  CVS: S1 S2 regular, + mechanical click GI: Bowel sounds present, Non tender and not distended with no gaurding, rigidity or rebound. Extremities: B/L Lower Ext shows no edema, both legs are warm to touch Neurology:  speech clear,Non focal, sensation is grossly intact. Psychiatric: Normal judgment and insight. Alert and oriented x 3. Normal mood. Musculoskeletal:No digital cyanosis Skin:No Rash, warm and dry Wounds:N/A  I have personally reviewed following labs and imaging studies  LABORATORY DATA: CBC: Recent Labs  Lab 11/09/17 0343 11/10/17 0435  11/11/17 0337 11/11/17 1819 11/12/17 0500 11/12/17 2133 11/13/17 0356  WBC 10.8* 13.4*  --  12.6*  --  10.7*  --  8.9  HGB 8.4* 6.7*   < > 8.1* 7.7* 7.5* 9.9* 9.5*  HCT 26.5* 21.1*   < > 25.1* 23.4* 23.4* 31.3* 29.9*  MCV 94.0 93.8  --  91.3  --  94.4  --  91.7  PLT 266 266  --  229  --  222  --  215   < > = values in this interval not displayed.    Basic Metabolic Panel: Recent Labs  Lab 11/07/17 0517 11/08/17 0427 11/09/17 0343 11/10/17 0435 11/11/17 0337 11/12/17 0500  NA 140 139 138 138 136 135    K 3.7 4.1 4.0 4.4 4.0 3.9  CL 108 107 109 109 110 107  CO2 25 25 24 24  20* 21*  GLUCOSE 116* 99 119* 113* 105* 96  BUN 22 15 12 19 18 14   CREATININE 1.08 1.02 1.03 1.04 0.97 0.88  CALCIUM 8.2* 8.2* 8.2* 7.8* 7.5* 7.6*  PHOS 3.1  --   --   --   --   --     GFR: Estimated Creatinine Clearance: 76.6 mL/min (by C-G formula based on SCr of 0.88 mg/dL).  Liver Function Tests: Recent Labs  Lab 11/07/17 0517 11/08/17 0427 11/12/17 0500  AST 137* 107* 56*  ALT 79* 69* 46*  ALKPHOS 67 67 53  BILITOT 1.0 1.2 0.7  PROT 5.6* 5.6* 4.6*  ALBUMIN 2.9* 2.9* 2.5*   No results for input(s): LIPASE, AMYLASE in the last 168 hours. No results for input(s): AMMONIA in the last 168  hours.  Coagulation Profile: Recent Labs  Lab 11/09/17 0343 11/10/17 0435 11/10/17 1752 11/11/17 0337 11/12/17 0500  INR 1.29 1.59 2.00 2.16 1.19    Cardiac Enzymes: No results for input(s): CKTOTAL, CKMB, CKMBINDEX, TROPONINI in the last 168 hours.  BNP (last 3 results) No results for input(s): PROBNP in the last 8760 hours.  HbA1C: No results for input(s): HGBA1C in the last 72 hours.  CBG: Recent Labs  Lab 11/10/17 2048  GLUCAP 111*    Lipid Profile: No results for input(s): CHOL, HDL, LDLCALC, TRIG, CHOLHDL, LDLDIRECT in the last 72 hours.  Thyroid Function Tests: No results for input(s): TSH, T4TOTAL, FREET4, T3FREE, THYROIDAB in the last 72 hours.  Anemia Panel: No results for input(s): VITAMINB12, FOLATE, FERRITIN, TIBC, IRON, RETICCTPCT in the last 72 hours.  Urine analysis:    Component Value Date/Time   COLORURINE AMBER (A) 04/17/2012 1751   APPEARANCEUR CLOUDY (A) 04/17/2012 1751   LABSPEC 1.027 04/17/2012 1751   PHURINE 6.0 04/17/2012 1751   GLUCOSEU NEGATIVE 04/17/2012 1751   HGBUR NEGATIVE 04/17/2012 1751   BILIRUBINUR SMALL (A) 04/17/2012 1751   KETONESUR 15 (A) 04/17/2012 1751   PROTEINUR 30 (A) 04/17/2012 1751   UROBILINOGEN 1.0 04/17/2012 1751   NITRITE NEGATIVE  04/17/2012 1751   LEUKOCYTESUR LARGE (A) 04/17/2012 1751    Sepsis Labs: Lactic Acid, Venous    Component Value Date/Time   LATICACIDVEN 1.2 04/17/2012 1709    MICROBIOLOGY: Recent Results (from the past 240 hour(s))  MRSA PCR Screening     Status: None   Collection Time: 11/05/17  2:08 PM  Result Value Ref Range Status   MRSA by PCR NEGATIVE NEGATIVE Final    Comment:        The GeneXpert MRSA Assay (FDA approved for NASAL specimens only), is one component of a comprehensive MRSA colonization surveillance program. It is not intended to diagnose MRSA infection nor to guide or monitor treatment for MRSA infections. Performed at [facility], [place] [address], [place], [place] [address]   Culture, Urine     Status: None   Collection Time: 11/10/17  9:59 PM  Result Value Ref Range Status   Specimen Description URINE, CATHETERIZED  Final   Special Requests NONE  Final   Culture   Final    NO GROWTH Performed at [facility], [address] [place], [place] [address]    Report Status 11/12/2017 FINAL  Final  Culture, blood (routine x 2)     Status: None (Preliminary result)   Collection Time: 11/10/17 11:12 PM  Result Value Ref Range Status   Specimen Description BLOOD RIGHT ANTECUBITAL  Final   Special Requests   Final    BOTTLES DRAWN AEROBIC AND ANAEROBIC Blood Culture adequate volume   Culture   Final    NO GROWTH 2 DAYS Performed at [facility], [place] [address], [place], [place] [address]    Report Status PENDING  Incomplete  Culture, blood (routine x 2)     Status: None (Preliminary result)   Collection Time: 11/10/17 11:19 PM  Result Value Ref Range Status   Specimen Description BLOOD RIGHT HAND  Final   Special Requests   Final    BOTTLES DRAWN AEROBIC AND ANAEROBIC Blood Culture adequate volume   Culture   Final    NO GROWTH 2 DAYS Performed at [facility], [place] [address], [place], [place] [address]    Report Status PENDING   Incomplete  RADIOLOGY STUDIES/RESULTS: Nm Gi Blood Loss  Result Date: 11/10/2017 CLINICAL DATA:  Melena/hematochezia this morning. Recent endoscopy demonstrated antral ulcers and 3 large duodenal bulbs ulcers. Patient is on anticoagulation for mechanical valve and atrial fibrillation. EXAM: NUCLEAR MEDICINE GASTROINTESTINAL BLEEDING SCAN TECHNIQUE: Sequential abdominal images were obtained following intravenous administration of Tc-2m labeled red blood cells. RADIOPHARMACEUTICALS:  26.3 mCi Tc-103m pertechnetate in-vitro labeled red cells. COMPARISON:  None. FINDINGS: There is a small focus abnormal radiotracer activity which appears to which to originate within the proximal small bowel. The overall amount of abnormal radiotracer activity is very small suggesting a small, transient bleed. Physiologic activity is seen within the heart, liver, urinary bladder, abdominal aorta and pelvic vasculature. IMPRESSION: Suspected transient proximal small bowel bleed. Critical Value/emergent results were called by telephone at the time of interpretation on 11/10/2017 at 1636 to Dr. Ronnette Juniper , who verbally acknowledged these results. Electronically Signed   By: Sandi Mariscal M.D.   On: 11/10/2017 16:35   Ir Angiogram Visceral Selective  Result Date: 11/11/2017 INDICATION: Gastrointestinal bleeding EXAM: MESENTERIC ANGIOGRAM AND GASTRODUODENAL ARTERY EMBOLIZATION. MEDICATIONS: NONE ANESTHESIA/SEDATION: Moderate (conscious) sedation was employed during this procedure. A total of Versed 1 mg and Fentanyl 50 mcg was administered intravenously. Moderate Sedation Time: 76 minutes. The patient's level of consciousness and vital signs were monitored continuously by radiology nursing throughout the procedure under my direct supervision. CONTRAST:  170 CC ISOVUE-300 FLUOROSCOPY TIME:  Fluoroscopy Time: 13 minutes 30 seconds (815 mGy). COMPLICATIONS: None immediate. PROCEDURE: Informed consent was obtained from the patient  following explanation of the procedure, risks, benefits and alternatives. The patient understands, agrees and consents for the procedure. All questions were addressed. A time out was performed prior to the initiation of the procedure. Maximal barrier sterile technique utilized including caps, mask, sterile gowns, sterile gloves, large sterile drape, hand hygiene, and Betadine prep. The right groin was prepped and draped in a sterile fashion. 1% lidocaine was utilized for local anesthesia. Under sonographic guidance, a micropuncture needle was inserted into the right common femoral artery and removed over a 018 wire. This was up sized to a Bentson. The common femoral artery was noted to be patent. Sonographic documentation was obtained A 5 French sheath was inserted. A 5 French pigtail catheter was advanced into the aorta. Lateral abdominal aortography was performed for the origins of the celiac and SMA. The catheter was retracted. RA 0 distal abdominal aortography was performed for the origin of the IMA. The pigtail was exchanged for a cobra 2 catheter. The celiac was selected. Angiography was performed. This was advanced over a glidewire into the common hepatic artery. Angiography was performed. A Renegade microcatheter was advanced over a 016 fathom wire into the gastroduodenal artery and angiography was performed. The catheter was then advanced over the wire into the distal gastroduodenal artery. Embolization was performed utilizing 3 mm and 4 mm diameter interlock coils for total embolization of the gastroduodenal artery. The microcatheter was retracted into the common hepatic artery and repeat angiography was performed. The Cobra 2 catheter was aspirated and flushed. It was retracted into the abdominal aorta. It was advanced over a Bentson wire into the SMA and angiography was performed. It was then retracted and advanced over a Bentson wire into the IMA and angiography was performed. The catheter was then  advanced into the right external iliac artery and right RA 0 femoral angiography was performed. The sheath was flushed and sewn in place.  The patient's INR is 2.1. FINDINGS: Lateral  abdominal aortography demonstrates patency of the celiac and SMA. RA 0 distal abdominal aortography demonstrates patency of the origin of the IMA. Celiac angiography delineates anatomy. There is no evidence of active bleeding within the territory of the gastroduodenal artery or left gastric artery Selective angiography of the common hepatic artery demonstrates patency of the gastroduodenal artery. Subsequent images demonstrate coil embolization of the gastroduodenal artery. 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 Injection of the SMA demonstrates patency of the branches but no source of active bleeding. Angiography of the IMA over the pelvis and left upper quadrant demonstrates no evidence of bleeding from the descending or sigmoid colon. IMPRESSION: Angiography of the celiac, SMA, and IMA demonstrates no active source of bleeding. Empiric embolization of the gastroduodenal artery was performed. The right femoral artery sheath was sewn in place in preparation for sheath removal after the INR is normalized. Electronically Signed   By: Marybelle Killings M.D.   On: 11/11/2017 14:28   Ir Angiogram Visceral Selective  Result Date: 11/11/2017 INDICATION: Gastrointestinal bleeding EXAM: MESENTERIC ANGIOGRAM AND GASTRODUODENAL ARTERY EMBOLIZATION. MEDICATIONS: NONE ANESTHESIA/SEDATION: Moderate (conscious) sedation was employed during this procedure. A total of Versed 1 mg and Fentanyl 50 mcg was administered intravenously. Moderate Sedation Time: 76 minutes. The patient's level of consciousness and vital signs were monitored continuously by radiology nursing throughout the procedure under my direct supervision. CONTRAST:  170 CC ISOVUE-300 FLUOROSCOPY TIME:  Fluoroscopy Time: 13  minutes 30 seconds (815 mGy). COMPLICATIONS: None immediate. PROCEDURE: Informed consent was obtained from the patient following explanation of the procedure, risks, benefits and alternatives. The patient understands, agrees and consents for the procedure. All questions were addressed. A time out was performed prior to the initiation of the procedure. Maximal barrier sterile technique utilized including caps, mask, sterile gowns, sterile gloves, large sterile drape, hand hygiene, and Betadine prep. The right groin was prepped and draped in a sterile fashion. 1% lidocaine was utilized for local anesthesia. Under sonographic guidance, a micropuncture needle was inserted into the right common femoral artery and removed over a 018 wire. This was up sized to a Bentson. The common femoral artery was noted to be patent. Sonographic documentation was obtained A 5 French sheath was inserted. A 5 French pigtail catheter was advanced into the aorta. Lateral abdominal aortography was performed for the origins of the celiac and SMA. The catheter was retracted. RA 0 distal abdominal aortography was performed for the origin of the IMA. The pigtail was exchanged for a cobra 2 catheter. The celiac was selected. Angiography was performed. This was advanced over a glidewire into the common hepatic artery. Angiography was performed. A Renegade microcatheter was advanced over a 016 fathom wire into the gastroduodenal artery and angiography was performed. The catheter was then advanced over the wire into the distal gastroduodenal artery. Embolization was performed utilizing 3 mm and 4 mm diameter interlock coils for total embolization of the gastroduodenal artery. The microcatheter was retracted into the common hepatic artery and repeat angiography was performed. The Cobra 2 catheter was aspirated and flushed. It was retracted into the abdominal aorta. It was advanced over a Bentson wire into the SMA and angiography was performed. It was  then retracted and advanced over a Bentson wire into the IMA and angiography was performed. The catheter was then advanced into the right external iliac artery and right RA 0 femoral angiography was performed. The sheath was flushed and sewn in place.  The patient's INR is  2.1. FINDINGS: Lateral abdominal aortography demonstrates patency of the celiac and SMA. RA 0 distal abdominal aortography demonstrates patency of the origin of the IMA. Celiac angiography delineates anatomy. There is no evidence of active bleeding within the territory of the gastroduodenal artery or left gastric artery Selective angiography of the common hepatic artery demonstrates patency of the gastroduodenal artery. Subsequent images demonstrate coil embolization of the gastroduodenal artery. 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 Injection of the SMA demonstrates patency of the branches but no source of active bleeding. Angiography of the IMA over the pelvis and left upper quadrant demonstrates no evidence of bleeding from the descending or sigmoid colon. IMPRESSION: Angiography of the celiac, SMA, and IMA demonstrates no active source of bleeding. Empiric embolization of the gastroduodenal artery was performed. The right femoral artery sheath was sewn in place in preparation for sheath removal after the INR is normalized. Electronically Signed   By: Marybelle Killings M.D.   On: 11/11/2017 14:28   Ir Angiogram Visceral Selective  Result Date: 11/11/2017 INDICATION: Gastrointestinal bleeding EXAM: MESENTERIC ANGIOGRAM AND GASTRODUODENAL ARTERY EMBOLIZATION. MEDICATIONS: NONE ANESTHESIA/SEDATION: Moderate (conscious) sedation was employed during this procedure. A total of Versed 1 mg and Fentanyl 50 mcg was administered intravenously. Moderate Sedation Time: 76 minutes. The patient's level of consciousness and vital signs were monitored continuously by radiology nursing throughout  the procedure under my direct supervision. CONTRAST:  170 CC ISOVUE-300 FLUOROSCOPY TIME:  Fluoroscopy Time: 13 minutes 30 seconds (815 mGy). COMPLICATIONS: None immediate. PROCEDURE: Informed consent was obtained from the patient following explanation of the procedure, risks, benefits and alternatives. The patient understands, agrees and consents for the procedure. All questions were addressed. A time out was performed prior to the initiation of the procedure. Maximal barrier sterile technique utilized including caps, mask, sterile gowns, sterile gloves, large sterile drape, hand hygiene, and Betadine prep. The right groin was prepped and draped in a sterile fashion. 1% lidocaine was utilized for local anesthesia. Under sonographic guidance, a micropuncture needle was inserted into the right common femoral artery and removed over a 018 wire. This was up sized to a Bentson. The common femoral artery was noted to be patent. Sonographic documentation was obtained A 5 French sheath was inserted. A 5 French pigtail catheter was advanced into the aorta. Lateral abdominal aortography was performed for the origins of the celiac and SMA. The catheter was retracted. RA 0 distal abdominal aortography was performed for the origin of the IMA. The pigtail was exchanged for a cobra 2 catheter. The celiac was selected. Angiography was performed. This was advanced over a glidewire into the common hepatic artery. Angiography was performed. A Renegade microcatheter was advanced over a 016 fathom wire into the gastroduodenal artery and angiography was performed. The catheter was then advanced over the wire into the distal gastroduodenal artery. Embolization was performed utilizing 3 mm and 4 mm diameter interlock coils for total embolization of the gastroduodenal artery. The microcatheter was retracted into the common hepatic artery and repeat angiography was performed. The Cobra 2 catheter was aspirated and flushed. It was retracted  into the abdominal aorta. It was advanced over a Bentson wire into the SMA and angiography was performed. It was then retracted and advanced over a Bentson wire into the IMA and angiography was performed. The catheter was then advanced into the right external iliac artery and right RA 0 femoral angiography was performed. The sheath was flushed and sewn in place.  The  patient's INR is 2.1. FINDINGS: Lateral abdominal aortography demonstrates patency of the celiac and SMA. RA 0 distal abdominal aortography demonstrates patency of the origin of the IMA. Celiac angiography delineates anatomy. There is no evidence of active bleeding within the territory of the gastroduodenal artery or left gastric artery Selective angiography of the common hepatic artery demonstrates patency of the gastroduodenal artery. Subsequent images demonstrate coil embolization of the gastroduodenal artery. 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 Injection of the SMA demonstrates patency of the branches but no source of active bleeding. Angiography of the IMA over the pelvis and left upper quadrant demonstrates no evidence of bleeding from the descending or sigmoid colon. IMPRESSION: Angiography of the celiac, SMA, and IMA demonstrates no active source of bleeding. Empiric embolization of the gastroduodenal artery was performed. The right femoral artery sheath was sewn in place in preparation for sheath removal after the INR is normalized. Electronically Signed   By: Marybelle Killings M.D.   On: 11/11/2017 14:28   Ir Angiogram Selective Each Additional Vessel  Result Date: 11/11/2017 INDICATION: Gastrointestinal bleeding EXAM: MESENTERIC ANGIOGRAM AND GASTRODUODENAL ARTERY EMBOLIZATION. MEDICATIONS: NONE ANESTHESIA/SEDATION: Moderate (conscious) sedation was employed during this procedure. A total of Versed 1 mg and Fentanyl 50 mcg was administered intravenously. Moderate Sedation Time:  76 minutes. The patient's level of consciousness and vital signs were monitored continuously by radiology nursing throughout the procedure under my direct supervision. CONTRAST:  170 CC ISOVUE-300 FLUOROSCOPY TIME:  Fluoroscopy Time: 13 minutes 30 seconds (815 mGy). COMPLICATIONS: None immediate. PROCEDURE: Informed consent was obtained from the patient following explanation of the procedure, risks, benefits and alternatives. The patient understands, agrees and consents for the procedure. All questions were addressed. A time out was performed prior to the initiation of the procedure. Maximal barrier sterile technique utilized including caps, mask, sterile gowns, sterile gloves, large sterile drape, hand hygiene, and Betadine prep. The right groin was prepped and draped in a sterile fashion. 1% lidocaine was utilized for local anesthesia. Under sonographic guidance, a micropuncture needle was inserted into the right common femoral artery and removed over a 018 wire. This was up sized to a Bentson. The common femoral artery was noted to be patent. Sonographic documentation was obtained A 5 French sheath was inserted. A 5 French pigtail catheter was advanced into the aorta. Lateral abdominal aortography was performed for the origins of the celiac and SMA. The catheter was retracted. RA 0 distal abdominal aortography was performed for the origin of the IMA. The pigtail was exchanged for a cobra 2 catheter. The celiac was selected. Angiography was performed. This was advanced over a glidewire into the common hepatic artery. Angiography was performed. A Renegade microcatheter was advanced over a 016 fathom wire into the gastroduodenal artery and angiography was performed. The catheter was then advanced over the wire into the distal gastroduodenal artery. Embolization was performed utilizing 3 mm and 4 mm diameter interlock coils for total embolization of the gastroduodenal artery. The microcatheter was retracted into the  common hepatic artery and repeat angiography was performed. The Cobra 2 catheter was aspirated and flushed. It was retracted into the abdominal aorta. It was advanced over a Bentson wire into the SMA and angiography was performed. It was then retracted and advanced over a Bentson wire into the IMA and angiography was performed. The catheter was then advanced into the right external iliac artery and right RA 0 femoral angiography was performed. The sheath was flushed and  sewn in place.  The patient's INR is 2.1. FINDINGS: Lateral abdominal aortography demonstrates patency of the celiac and SMA. RA 0 distal abdominal aortography demonstrates patency of the origin of the IMA. Celiac angiography delineates anatomy. There is no evidence of active bleeding within the territory of the gastroduodenal artery or left gastric artery Selective angiography of the common hepatic artery demonstrates patency of the gastroduodenal artery. Subsequent images demonstrate coil embolization of the gastroduodenal artery. 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 Injection of the SMA demonstrates patency of the branches but no source of active bleeding. Angiography of the IMA over the pelvis and left upper quadrant demonstrates no evidence of bleeding from the descending or sigmoid colon. IMPRESSION: Angiography of the celiac, SMA, and IMA demonstrates no active source of bleeding. Empiric embolization of the gastroduodenal artery was performed. The right femoral artery sheath was sewn in place in preparation for sheath removal after the INR is normalized. Electronically Signed   By: Marybelle Killings M.D.   On: 11/11/2017 14:28   Ir US Guide Vasc Access Right  Result Date: 11/11/2017 INDICATION: Gastrointestinal bleeding EXAM: MESENTERIC ANGIOGRAM AND GASTRODUODENAL ARTERY EMBOLIZATION. MEDICATIONS: NONE ANESTHESIA/SEDATION: Moderate (conscious) sedation was employed during this  procedure. A total of Versed 1 mg and Fentanyl 50 mcg was administered intravenously. Moderate Sedation Time: 76 minutes. The patient's level of consciousness and vital signs were monitored continuously by radiology nursing throughout the procedure under my direct supervision. CONTRAST:  170 CC ISOVUE-300 FLUOROSCOPY TIME:  Fluoroscopy Time: 13 minutes 30 seconds (815 mGy). COMPLICATIONS: None immediate. PROCEDURE: Informed consent was obtained from the patient following explanation of the procedure, risks, benefits and alternatives. The patient understands, agrees and consents for the procedure. All questions were addressed. A time out was performed prior to the initiation of the procedure. Maximal barrier sterile technique utilized including caps, mask, sterile gowns, sterile gloves, large sterile drape, hand hygiene, and Betadine prep. The right groin was prepped and draped in a sterile fashion. 1% lidocaine was utilized for local anesthesia. Under sonographic guidance, a micropuncture needle was inserted into the right common femoral artery and removed over a 018 wire. This was up sized to a Bentson. The common femoral artery was noted to be patent. Sonographic documentation was obtained A 5 French sheath was inserted. A 5 French pigtail catheter was advanced into the aorta. Lateral abdominal aortography was performed for the origins of the celiac and SMA. The catheter was retracted. RA 0 distal abdominal aortography was performed for the origin of the IMA. The pigtail was exchanged for a cobra 2 catheter. The celiac was selected. Angiography was performed. This was advanced over a glidewire into the common hepatic artery. Angiography was performed. A Renegade microcatheter was advanced over a 016 fathom wire into the gastroduodenal artery and angiography was performed. The catheter was then advanced over the wire into the distal gastroduodenal artery. Embolization was performed utilizing 3 mm and 4 mm diameter  interlock coils for total embolization of the gastroduodenal artery. The microcatheter was retracted into the common hepatic artery and repeat angiography was performed. The Cobra 2 catheter was aspirated and flushed. It was retracted into the abdominal aorta. It was advanced over a Bentson wire into the SMA and angiography was performed. It was then retracted and advanced over a Bentson wire into the IMA and angiography was performed. The catheter was then advanced into the right external iliac artery and right RA 0 femoral angiography was performed.  The sheath was flushed and sewn in place.  The patient's INR is 2.1. FINDINGS: Lateral abdominal aortography demonstrates patency of the celiac and SMA. RA 0 distal abdominal aortography demonstrates patency of the origin of the IMA. Celiac angiography delineates anatomy. There is no evidence of active bleeding within the territory of the gastroduodenal artery or left gastric artery Selective angiography of the common hepatic artery demonstrates patency of the gastroduodenal artery. Subsequent images demonstrate coil embolization of the gastroduodenal artery. 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 Injection of the SMA demonstrates patency of the branches but no source of active bleeding. Angiography of the IMA over the pelvis and left upper quadrant demonstrates no evidence of bleeding from the descending or sigmoid colon. IMPRESSION: Angiography of the celiac, SMA, and IMA demonstrates no active source of bleeding. Empiric embolization of the gastroduodenal artery was performed. The right femoral artery sheath was sewn in place in preparation for sheath removal after the INR is normalized. Electronically Signed   By: Marybelle Killings M.D.   On: 11/11/2017 14:28   [place] Guide Roadmapping  Result Date: 11/13/2017 INDICATION: Gastrointestinal bleeding  EXAM: MESENTERIC  ANGIOGRAM AND GASTRODUODENAL ARTERY EMBOLIZATION.  MEDICATIONS: NONE  ANESTHESIA/SEDATION: Moderate (conscious) sedation was employed during this procedure. A total of Versed 1 mg and Fentanyl 50 mcg was administered intravenously.  Moderate Sedation Time: 76 minutes. The patient's level of consciousness and vital signs were monitored continuously by radiology nursing throughout the procedure under my direct supervision.  CONTRAST:  170 CC ISOVUE-300  FLUOROSCOPY TIME:  Fluoroscopy Time: 13 minutes 30 seconds (815 mGy).  COMPLICATIONS: None immediate.  PROCEDURE: Informed consent was obtained from the patient following explanation of the procedure, risks, benefits and alternatives. The patient understands, agrees and consents for the procedure. All questions were addressed. A time out was performed prior to the initiation of the procedure. Maximal barrier sterile technique utilized including caps, mask, sterile gowns, sterile gloves, large sterile drape, hand hygiene, and Betadine prep.  The right groin was prepped and draped in a sterile fashion. 1% lidocaine was utilized for local anesthesia. Under sonographic guidance, a micropuncture needle was inserted into the right common femoral artery and removed over a 018 wire. This was up sized to a Bentson. The common femoral artery was noted to be patent. Sonographic documentation was obtained  A 5 French sheath was inserted. A 5 French pigtail catheter was advanced into the aorta. Lateral abdominal aortography was performed for the origins of the celiac and SMA. The catheter was retracted. RA 0 distal abdominal aortography was performed for the origin of the IMA.  The pigtail was exchanged for a cobra 2 catheter. The celiac was selected. Angiography was performed. This was advanced over a glidewire into the common hepatic artery. Angiography was performed. A Renegade microcatheter was advanced over a 016 fathom wire into the gastroduodenal artery and  angiography was performed. The catheter was then advanced over the wire into the distal gastroduodenal artery. Embolization was performed utilizing 3 mm and 4 mm diameter interlock coils for total embolization of the gastroduodenal artery. The microcatheter was retracted into the common hepatic artery and repeat angiography was performed.  The Cobra 2 catheter was aspirated and flushed. It was retracted into the abdominal aorta. It was advanced over a Bentson wire into the SMA and angiography was performed.  It was then retracted and advanced over a Bentson wire into the  IMA and angiography was performed.  The catheter was then advanced into the right external iliac artery and right RA 0 femoral angiography was performed.  The sheath was flushed and sewn in place.  The patient's INR is 2.1.  FINDINGS: Lateral abdominal aortography demonstrates patency of the celiac and SMA.  RA 0 distal abdominal aortography demonstrates patency of the origin of the IMA.  Celiac angiography delineates anatomy. There is no evidence of active bleeding within the territory of the gastroduodenal artery or left gastric artery  Selective angiography of the common hepatic artery demonstrates patency of the gastroduodenal artery.  Subsequent images demonstrate coil embolization of the gastroduodenal artery. 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  Injection of the SMA demonstrates patency of the branches but no source of active bleeding.  Angiography of the IMA over the pelvis and left upper quadrant demonstrates no evidence of bleeding from the descending or sigmoid colon.  IMPRESSION: Angiography of the celiac, SMA, and IMA demonstrates no active source of bleeding.  Empiric embolization of the gastroduodenal artery was performed.  The right femoral artery sheath was sewn in place in preparation for sheath removal after the INR is normalized.   Electronically  Signed   By: Marybelle Killings M.D.   On: 11/11/2017 14:28    LOS: 8 days   Oren Binet, MD  [facility]  If 7PM-7AM, please contact night-coverage  Please page via [email or web address] on MD name and type text message  11/13/2017, 2:18 PM

## 2017-11-13 NOTE — Progress Notes (Signed)
Referring Physician(s): Dr. Therisa Doyne  Supervising Physician: Corrie Mckusick  Patient Status:  [facility] - In-pt  Chief Complaint: Follow-up GDA embolization by Dr. Barbie Banner 9/27.  Subjective:  77 y/o M with PMH significant for cardiomyopathy s/p biventricular ICD, atrial fibrillation no coumadin, mechanical aortic valve replacement (2002), HTN and HLD who presented to [facility] ED on 9/22 after experiencing multiple shocks from his ICD as well as hematochezia - he was found to have INR 9.7, he was hypotensive and hemoglobin below previous baseline. He was stabilized and transferred to [facility] for further evaluation of GI bleed.   Coumadin and heparin were held. He underwent EGD/colonoscopy on 9/25 which showed multiple non-bleeding gastric and duodenal ulcers, however no active bleeding was identified. Coumadin and heparin were resumed on 9/25 after above procedure however patient again experienced melena, hypotension and drop in hemoglobin and heparin and coumadin were again held. He required 2 units PRBCs. Nuclear medicine GIB scan was performed on 9/27 which showed suspected transient proximal small bowel bleed.  IR was consulted for GDA embolization which was successfully performed by Dr. Barbie Banner on 9/27. No active bleeding was noted at time of procedure and as such the GDA was empirically embolized. INR remained elevated post-procedure and as such there was a delay in the removal of the sheath, however it was ultimately removed without issue on 9/29.  Patient remains off anticoagulation at this time, he is unsure if he has experienced any more bleeding (per GI note it appears he has had some dark stools but no hematochezia). He states that he feels fine and is hopeful he can go home soon.   Allergies: Patient has no known allergies.  Medications: Prior to Admission medications   Medication Sig Start Date End Date Taking? Authorizing Provider  acetaminophen (TYLENOL) 500 MG tablet Take 1,000 mg by  mouth every 6 (six) hours as needed for headache (pain).   Yes [provider]  atorvastatin (LIPITOR) 10 MG tablet Take 10 mg by mouth daily.   Yes [provider]  cholecalciferol (VITAMIN D) 1000 units tablet Take 1,000 Units by mouth daily.   Yes [provider]  furosemide (LASIX) 20 MG tablet Take 20 mg by mouth daily.    Yes [provider]  losartan (COZAAR) 50 MG tablet Take 1 tablet (50 mg total) by mouth daily. Patient taking differently: Take 50 mg by mouth at bedtime.  04/23/13  Yes Satira Sark, MD  meclizine (ANTIVERT) 25 MG tablet Take 25 mg by mouth 3 (three) times daily as needed for dizziness.   Yes [provider]  metoprolol succinate (TOPROL-XL) 100 MG 24 hr tablet TAKE 1 AND 1/2 TABLETS BY MOUTH TWICE DAILY Patient taking differently: Take 150 mg by mouth 2 (two) times daily.  08/28/17  Yes Satira Sark, MD  metroNIDAZOLE (METROCREAM) 0.75 % cream Apply 1 application topically daily as needed (rosacea).   Yes [provider]  Omega-3 Fatty Acids (FISH OIL) 1000 MG CAPS Take 1,000 mg by mouth daily.    Yes [provider]  oxymetazoline (AFRIN) 0.05 % nasal spray Place 1 spray into both nostrils daily as needed for congestion.   Yes [provider]  phenylephrine (NEO-SYNEPHRINE) 1 % nasal spray Place 1 drop into both nostrils daily as needed for congestion.   Yes [provider]  sulfamethoxazole-trimethoprim (BACTRIM DS,SEPTRA DS) 800-160 MG tablet Take 1 tablet by mouth 2 (two) times daily. 3 day course completed on or about 11/02/17 -  additional 14 day course filled 11/03/17 but not yet taken 11/03/17  Yes [provider]  Tamsulosin HCl (FLOMAX) 0.4 MG CAPS Take 0.4 mg by mouth at bedtime.    Yes [provider]  vitamin C (ASCORBIC ACID) 500 MG tablet Take 500 mg by mouth daily.   Yes [provider]  warfarin (COUMADIN) 5 MG tablet Take 5 mg by mouth at  bedtime.    Yes [provider]     Vital Signs: BP 113/82   Pulse 78   Temp 97.6 F (36.4 C) (Oral)   Resp 18   Ht 5\' 8"  (1.727 m)   Wt 198 lb 3.1 oz (89.9 kg)   SpO2 99%   BMI 30.14 kg/m   Physical Exam  Constitutional: No distress.  HENT:  Head: Normocephalic.  Cardiovascular: Normal rate.  Irregular, mechanical valve  Pulmonary/Chest: Effort normal and breath sounds normal.  Abdominal: Soft. He exhibits no distension. There is no tenderness.  Musculoskeletal:  Right femoral insertion site clean, dry, intact without active bleeding.  Neurological: He is alert.  Skin: Skin is warm and dry. He is not diaphoretic.  Psychiatric: He has a normal mood and affect. His behavior is normal.  Nursing note and vitals reviewed.   Imaging: Nm Gi Blood Loss  Result Date: 11/10/2017 CLINICAL DATA:  Melena/hematochezia this morning. Recent endoscopy demonstrated antral ulcers and 3 large duodenal bulbs ulcers. Patient is on anticoagulation for mechanical valve and atrial fibrillation. EXAM: NUCLEAR MEDICINE GASTROINTESTINAL BLEEDING SCAN TECHNIQUE: Sequential abdominal images were obtained following intravenous administration of Tc-74m labeled red blood cells. RADIOPHARMACEUTICALS:  26.3 mCi Tc-43m pertechnetate in-vitro labeled red cells. COMPARISON:  None. FINDINGS: There is a small focus abnormal radiotracer activity which appears to which to originate within the proximal small bowel. The overall amount of abnormal radiotracer activity is very small suggesting a small, transient bleed. Physiologic activity is seen within the heart, liver, urinary bladder, abdominal aorta and pelvic vasculature. IMPRESSION: Suspected transient proximal small bowel bleed. Critical Value/emergent results were called by telephone at the time of interpretation on 11/10/2017 at 1636 to Dr. Ronnette Juniper , who verbally acknowledged these results. Electronically Signed   By: Sandi Mariscal M.D.   On: 11/10/2017  16:35   Ir Angiogram Visceral Selective  Result Date: 11/11/2017 INDICATION: Gastrointestinal bleeding EXAM: MESENTERIC ANGIOGRAM AND GASTRODUODENAL ARTERY EMBOLIZATION. MEDICATIONS: NONE ANESTHESIA/SEDATION: Moderate (conscious) sedation was employed during this procedure. A total of Versed 1 mg and Fentanyl 50 mcg was administered intravenously. Moderate Sedation Time: 76 minutes. The patient's level of consciousness and vital signs were monitored continuously by radiology nursing throughout the procedure under my direct supervision. CONTRAST:  170 CC ISOVUE-300 FLUOROSCOPY TIME:  Fluoroscopy Time: 13 minutes 30 seconds (815 mGy). COMPLICATIONS: None immediate. PROCEDURE: Informed consent was obtained from the patient following explanation of the procedure, risks, benefits and alternatives. The patient understands, agrees and consents for the procedure. All questions were addressed. A time out was performed prior to the initiation of the procedure. Maximal barrier sterile technique utilized including caps, mask, sterile gowns, sterile gloves, large sterile drape, hand hygiene, and Betadine prep. The right groin was prepped and draped in a sterile fashion. 1% lidocaine was utilized for local anesthesia. Under sonographic guidance, a micropuncture needle was inserted into the right common femoral artery and removed over a 018 wire. This was up sized to a Bentson. The common femoral artery was noted to be patent. Sonographic documentation was obtained A 5 French sheath was inserted.  A 5 French pigtail catheter was advanced into the aorta. Lateral abdominal aortography was performed for the origins of the celiac and SMA. The catheter was retracted. RA 0 distal abdominal aortography was performed for the origin of the IMA. The pigtail was exchanged for a cobra 2 catheter. The celiac was selected. Angiography was performed. This was advanced over a glidewire into the common hepatic artery. Angiography was performed.  A Renegade microcatheter was advanced over a 016 fathom wire into the gastroduodenal artery and angiography was performed. The catheter was then advanced over the wire into the distal gastroduodenal artery. Embolization was performed utilizing 3 mm and 4 mm diameter interlock coils for total embolization of the gastroduodenal artery. The microcatheter was retracted into the common hepatic artery and repeat angiography was performed. The Cobra 2 catheter was aspirated and flushed. It was retracted into the abdominal aorta. It was advanced over a Bentson wire into the SMA and angiography was performed. It was then retracted and advanced over a Bentson wire into the IMA and angiography was performed. The catheter was then advanced into the right external iliac artery and right RA 0 femoral angiography was performed. The sheath was flushed and sewn in place.  The patient's INR is 2.1. FINDINGS: Lateral abdominal aortography demonstrates patency of the celiac and SMA. RA 0 distal abdominal aortography demonstrates patency of the origin of the IMA. Celiac angiography delineates anatomy. There is no evidence of active bleeding within the territory of the gastroduodenal artery or left gastric artery Selective angiography of the common hepatic artery demonstrates patency of the gastroduodenal artery. Subsequent images demonstrate coil embolization of the gastroduodenal artery. 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 Injection of the SMA demonstrates patency of the branches but no source of active bleeding. Angiography of the IMA over the pelvis and left upper quadrant demonstrates no evidence of bleeding from the descending or sigmoid colon. IMPRESSION: Angiography of the celiac, SMA, and IMA demonstrates no active source of bleeding. Empiric embolization of the gastroduodenal artery was performed. The right femoral artery sheath was sewn in place in preparation  for sheath removal after the INR is normalized. Electronically Signed   By: Marybelle Killings M.D.   On: 11/11/2017 14:28   Ir Angiogram Visceral Selective  Result Date: 11/11/2017 INDICATION: Gastrointestinal bleeding EXAM: MESENTERIC ANGIOGRAM AND GASTRODUODENAL ARTERY EMBOLIZATION. MEDICATIONS: NONE ANESTHESIA/SEDATION: Moderate (conscious) sedation was employed during this procedure. A total of Versed 1 mg and Fentanyl 50 mcg was administered intravenously. Moderate Sedation Time: 76 minutes. The patient's level of consciousness and vital signs were monitored continuously by radiology nursing throughout the procedure under my direct supervision. CONTRAST:  170 CC ISOVUE-300 FLUOROSCOPY TIME:  Fluoroscopy Time: 13 minutes 30 seconds (815 mGy). COMPLICATIONS: None immediate. PROCEDURE: Informed consent was obtained from the patient following explanation of the procedure, risks, benefits and alternatives. The patient understands, agrees and consents for the procedure. All questions were addressed. A time out was performed prior to the initiation of the procedure. Maximal barrier sterile technique utilized including caps, mask, sterile gowns, sterile gloves, large sterile drape, hand hygiene, and Betadine prep. The right groin was prepped and draped in a sterile fashion. 1% lidocaine was utilized for local anesthesia. Under sonographic guidance, a micropuncture needle was inserted into the right common femoral artery and removed over a 018 wire. This was up sized to a Bentson. The common femoral artery was noted to be patent. Sonographic documentation was obtained A 5 [place]  sheath was inserted. A 5 French pigtail catheter was advanced into the aorta. Lateral abdominal aortography was performed for the origins of the celiac and SMA. The catheter was retracted. RA 0 distal abdominal aortography was performed for the origin of the IMA. The pigtail was exchanged for a cobra 2 catheter. The celiac was selected.  Angiography was performed. This was advanced over a glidewire into the common hepatic artery. Angiography was performed. A Renegade microcatheter was advanced over a 016 fathom wire into the gastroduodenal artery and angiography was performed. The catheter was then advanced over the wire into the distal gastroduodenal artery. Embolization was performed utilizing 3 mm and 4 mm diameter interlock coils for total embolization of the gastroduodenal artery. The microcatheter was retracted into the common hepatic artery and repeat angiography was performed. The Cobra 2 catheter was aspirated and flushed. It was retracted into the abdominal aorta. It was advanced over a Bentson wire into the SMA and angiography was performed. It was then retracted and advanced over a Bentson wire into the IMA and angiography was performed. The catheter was then advanced into the right external iliac artery and right RA 0 femoral angiography was performed. The sheath was flushed and sewn in place.  The patient's INR is 2.1. FINDINGS: Lateral abdominal aortography demonstrates patency of the celiac and SMA. RA 0 distal abdominal aortography demonstrates patency of the origin of the IMA. Celiac angiography delineates anatomy. There is no evidence of active bleeding within the territory of the gastroduodenal artery or left gastric artery Selective angiography of the common hepatic artery demonstrates patency of the gastroduodenal artery. Subsequent images demonstrate coil embolization of the gastroduodenal artery. 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 Injection of the SMA demonstrates patency of the branches but no source of active bleeding. Angiography of the IMA over the pelvis and left upper quadrant demonstrates no evidence of bleeding from the descending or sigmoid colon. IMPRESSION: Angiography of the celiac, SMA, and IMA demonstrates no active source of bleeding. Empiric  embolization of the gastroduodenal artery was performed. The right femoral artery sheath was sewn in place in preparation for sheath removal after the INR is normalized. Electronically Signed   By: Marybelle Killings M.D.   On: 11/11/2017 14:28   Ir Angiogram Visceral Selective  Result Date: 11/11/2017 INDICATION: Gastrointestinal bleeding EXAM: MESENTERIC ANGIOGRAM AND GASTRODUODENAL ARTERY EMBOLIZATION. MEDICATIONS: NONE ANESTHESIA/SEDATION: Moderate (conscious) sedation was employed during this procedure. A total of Versed 1 mg and Fentanyl 50 mcg was administered intravenously. Moderate Sedation Time: 76 minutes. The patient's level of consciousness and vital signs were monitored continuously by radiology nursing throughout the procedure under my direct supervision. CONTRAST:  170 CC ISOVUE-300 FLUOROSCOPY TIME:  Fluoroscopy Time: 13 minutes 30 seconds (815 mGy). COMPLICATIONS: None immediate. PROCEDURE: Informed consent was obtained from the patient following explanation of the procedure, risks, benefits and alternatives. The patient understands, agrees and consents for the procedure. All questions were addressed. A time out was performed prior to the initiation of the procedure. Maximal barrier sterile technique utilized including caps, mask, sterile gowns, sterile gloves, large sterile drape, hand hygiene, and Betadine prep. The right groin was prepped and draped in a sterile fashion. 1% lidocaine was utilized for local anesthesia. Under sonographic guidance, a micropuncture needle was inserted into the right common femoral artery and removed over a 018 wire. This was up sized to a Bentson. The common femoral artery was noted to be patent. Sonographic documentation was obtained  A 5 French sheath was inserted. A 5 French pigtail catheter was advanced into the aorta. Lateral abdominal aortography was performed for the origins of the celiac and SMA. The catheter was retracted. RA 0 distal abdominal aortography was  performed for the origin of the IMA. The pigtail was exchanged for a cobra 2 catheter. The celiac was selected. Angiography was performed. This was advanced over a glidewire into the common hepatic artery. Angiography was performed. A Renegade microcatheter was advanced over a 016 fathom wire into the gastroduodenal artery and angiography was performed. The catheter was then advanced over the wire into the distal gastroduodenal artery. Embolization was performed utilizing 3 mm and 4 mm diameter interlock coils for total embolization of the gastroduodenal artery. The microcatheter was retracted into the common hepatic artery and repeat angiography was performed. The Cobra 2 catheter was aspirated and flushed. It was retracted into the abdominal aorta. It was advanced over a Bentson wire into the SMA and angiography was performed. It was then retracted and advanced over a Bentson wire into the IMA and angiography was performed. The catheter was then advanced into the right external iliac artery and right RA 0 femoral angiography was performed. The sheath was flushed and sewn in place.  The patient's INR is 2.1. FINDINGS: Lateral abdominal aortography demonstrates patency of the celiac and SMA. RA 0 distal abdominal aortography demonstrates patency of the origin of the IMA. Celiac angiography delineates anatomy. There is no evidence of active bleeding within the territory of the gastroduodenal artery or left gastric artery Selective angiography of the common hepatic artery demonstrates patency of the gastroduodenal artery. Subsequent images demonstrate coil embolization of the gastroduodenal artery. 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 Injection of the SMA demonstrates patency of the branches but no source of active bleeding. Angiography of the IMA over the pelvis and left upper quadrant demonstrates no evidence of bleeding from the descending or sigmoid  colon. IMPRESSION: Angiography of the celiac, SMA, and IMA demonstrates no active source of bleeding. Empiric embolization of the gastroduodenal artery was performed. The right femoral artery sheath was sewn in place in preparation for sheath removal after the INR is normalized. Electronically Signed   By: Marybelle Killings M.D.   On: 11/11/2017 14:28   Ir Angiogram Selective Each Additional Vessel  Result Date: 11/11/2017 INDICATION: Gastrointestinal bleeding EXAM: MESENTERIC ANGIOGRAM AND GASTRODUODENAL ARTERY EMBOLIZATION. MEDICATIONS: NONE ANESTHESIA/SEDATION: Moderate (conscious) sedation was employed during this procedure. A total of Versed 1 mg and Fentanyl 50 mcg was administered intravenously. Moderate Sedation Time: 76 minutes. The patient's level of consciousness and vital signs were monitored continuously by radiology nursing throughout the procedure under my direct supervision. CONTRAST:  170 CC ISOVUE-300 FLUOROSCOPY TIME:  Fluoroscopy Time: 13 minutes 30 seconds (815 mGy). COMPLICATIONS: None immediate. PROCEDURE: Informed consent was obtained from the patient following explanation of the procedure, risks, benefits and alternatives. The patient understands, agrees and consents for the procedure. All questions were addressed. A time out was performed prior to the initiation of the procedure. Maximal barrier sterile technique utilized including caps, mask, sterile gowns, sterile gloves, large sterile drape, hand hygiene, and Betadine prep. The right groin was prepped and draped in a sterile fashion. 1% lidocaine was utilized for local anesthesia. Under sonographic guidance, a micropuncture needle was inserted into the right common femoral artery and removed over a 018 wire. This was up sized to a Bentson. The common femoral artery was noted to be  patent. Sonographic documentation was obtained A 5 French sheath was inserted. A 5 French pigtail catheter was advanced into the aorta. Lateral abdominal  aortography was performed for the origins of the celiac and SMA. The catheter was retracted. RA 0 distal abdominal aortography was performed for the origin of the IMA. The pigtail was exchanged for a cobra 2 catheter. The celiac was selected. Angiography was performed. This was advanced over a glidewire into the common hepatic artery. Angiography was performed. A Renegade microcatheter was advanced over a 016 fathom wire into the gastroduodenal artery and angiography was performed. The catheter was then advanced over the wire into the distal gastroduodenal artery. Embolization was performed utilizing 3 mm and 4 mm diameter interlock coils for total embolization of the gastroduodenal artery. The microcatheter was retracted into the common hepatic artery and repeat angiography was performed. The Cobra 2 catheter was aspirated and flushed. It was retracted into the abdominal aorta. It was advanced over a Bentson wire into the SMA and angiography was performed. It was then retracted and advanced over a Bentson wire into the IMA and angiography was performed. The catheter was then advanced into the right external iliac artery and right RA 0 femoral angiography was performed. The sheath was flushed and sewn in place.  The patient's INR is 2.1. FINDINGS: Lateral abdominal aortography demonstrates patency of the celiac and SMA. RA 0 distal abdominal aortography demonstrates patency of the origin of the IMA. Celiac angiography delineates anatomy. There is no evidence of active bleeding within the territory of the gastroduodenal artery or left gastric artery Selective angiography of the common hepatic artery demonstrates patency of the gastroduodenal artery. Subsequent images demonstrate coil embolization of the gastroduodenal artery. 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 Injection of the SMA demonstrates patency of the branches but no source of active bleeding.  Angiography of the IMA over the pelvis and left upper quadrant demonstrates no evidence of bleeding from the descending or sigmoid colon. IMPRESSION: Angiography of the celiac, SMA, and IMA demonstrates no active source of bleeding. Empiric embolization of the gastroduodenal artery was performed. The right femoral artery sheath was sewn in place in preparation for sheath removal after the INR is normalized. Electronically Signed   By: Marybelle Killings M.D.   On: 11/11/2017 14:28   Ir US Guide Vasc Access Right  Result Date: 11/11/2017 INDICATION: Gastrointestinal bleeding EXAM: MESENTERIC ANGIOGRAM AND GASTRODUODENAL ARTERY EMBOLIZATION. MEDICATIONS: NONE ANESTHESIA/SEDATION: Moderate (conscious) sedation was employed during this procedure. A total of Versed 1 mg and Fentanyl 50 mcg was administered intravenously. Moderate Sedation Time: 76 minutes. The patient's level of consciousness and vital signs were monitored continuously by radiology nursing throughout the procedure under my direct supervision. CONTRAST:  170 CC ISOVUE-300 FLUOROSCOPY TIME:  Fluoroscopy Time: 13 minutes 30 seconds (815 mGy). COMPLICATIONS: None immediate. PROCEDURE: Informed consent was obtained from the patient following explanation of the procedure, risks, benefits and alternatives. The patient understands, agrees and consents for the procedure. All questions were addressed. A time out was performed prior to the initiation of the procedure. Maximal barrier sterile technique utilized including caps, mask, sterile gowns, sterile gloves, large sterile drape, hand hygiene, and Betadine prep. The right groin was prepped and draped in a sterile fashion. 1% lidocaine was utilized for local anesthesia. Under sonographic guidance, a micropuncture needle was inserted into the right common femoral artery and removed over a 018 wire. This was up sized to a Bentson. The common femoral  artery was noted to be patent. Sonographic documentation was  obtained A 5 French sheath was inserted. A 5 French pigtail catheter was advanced into the aorta. Lateral abdominal aortography was performed for the origins of the celiac and SMA. The catheter was retracted. RA 0 distal abdominal aortography was performed for the origin of the IMA. The pigtail was exchanged for a cobra 2 catheter. The celiac was selected. Angiography was performed. This was advanced over a glidewire into the common hepatic artery. Angiography was performed. A Renegade microcatheter was advanced over a 016 fathom wire into the gastroduodenal artery and angiography was performed. The catheter was then advanced over the wire into the distal gastroduodenal artery. Embolization was performed utilizing 3 mm and 4 mm diameter interlock coils for total embolization of the gastroduodenal artery. The microcatheter was retracted into the common hepatic artery and repeat angiography was performed. The Cobra 2 catheter was aspirated and flushed. It was retracted into the abdominal aorta. It was advanced over a Bentson wire into the SMA and angiography was performed. It was then retracted and advanced over a Bentson wire into the IMA and angiography was performed. The catheter was then advanced into the right external iliac artery and right RA 0 femoral angiography was performed. The sheath was flushed and sewn in place.  The patient's INR is 2.1. FINDINGS: Lateral abdominal aortography demonstrates patency of the celiac and SMA. RA 0 distal abdominal aortography demonstrates patency of the origin of the IMA. Celiac angiography delineates anatomy. There is no evidence of active bleeding within the territory of the gastroduodenal artery or left gastric artery Selective angiography of the common hepatic artery demonstrates patency of the gastroduodenal artery. Subsequent images demonstrate coil embolization of the gastroduodenal artery. Subsequent angiogram of the common hepatic artery demonstrates occlusion of  the gastroduodenal artery. Injection of the SMA demonstrates patency of the branches but no source of active bleeding. Angiography of the IMA over the pelvis and left upper quadrant demonstrates no evidence of bleeding from the descending or sigmoid colon. IMPRESSION: Angiography of the celiac, SMA, and IMA demonstrates no active source of bleeding. Empiric embolization of the gastroduodenal artery was performed. The right femoral artery sheath was sewn in place in preparation for sheath removal after the INR is normalized. Electronically Signed   By: Marybelle Killings M.D.   On: 11/11/2017 14:28   [employer] Road[employer]  Result Date: 11/13/2017 INDICATION: Gastrointestinal bleeding  EXAM: MESENTERIC ANGIOGRAM AND GASTRODUODENAL ARTERY EMBOLIZATION.  MEDICATIONS: NONE  ANESTHESIA/SEDATION: Moderate (conscious) sedation was employed during this procedure. A total of Versed 1 mg and Fentanyl 50 mcg was administered intravenously.  Moderate Sedation Time: 76 minutes. The patient's level of consciousness and vital signs were monitored continuously by radiology nursing throughout the procedure under my direct supervision.  CONTRAST:  170 CC [id number]  FLUOROSCOPY TIME:  Fluoroscopy Time: 13 minutes 30 seconds (815 mGy).  COMPLICATIONS: None immediate.  PROCEDURE: Informed consent was obtained from the patient following explanation of the procedure, risks, benefits and alternatives. The patient understands, agrees and consents for the procedure. All questions were addressed. A time out was performed prior to the initiation of the procedure. Maximal barrier sterile technique utilized including caps, mask, sterile gowns, sterile gloves, large sterile drape, hand hygiene, and Betadine prep.  The right groin was prepped and draped in a sterile fashion. 1% lidocaine was utilized for local anesthesia. Under sonographic guidance, a micropuncture needle was inserted into the  right  common femoral artery and removed over a 018 wire. This was up sized to a Bentson. The common femoral artery was noted to be patent. Sonographic documentation was obtained  A 5 French sheath was inserted. A 5 French pigtail catheter was advanced into the aorta. Lateral abdominal aortography was performed for the origins of the celiac and SMA. The catheter was retracted. RA 0 distal abdominal aortography was performed for the origin of the IMA.  The pigtail was exchanged for a cobra 2 catheter. The celiac was selected. Angiography was performed. This was advanced over a glidewire into the common hepatic artery. Angiography was performed. A Renegade microcatheter was advanced over a 016 fathom wire into the gastroduodenal artery and angiography was performed. The catheter was then advanced over the wire into the distal gastroduodenal artery. Embolization was performed utilizing 3 mm and 4 mm diameter interlock coils for total embolization of the gastroduodenal artery. The microcatheter was retracted into the common hepatic artery and repeat angiography was performed.  The Cobra 2 catheter was aspirated and flushed. It was retracted into the abdominal aorta. It was advanced over a Bentson wire into the SMA and angiography was performed.  It was then retracted and advanced over a Bentson wire into the IMA and angiography was performed.  The catheter was then advanced into the right external iliac artery and right RA 0 femoral angiography was performed.  The sheath was flushed and sewn in place.  The patient's INR is 2.1.  FINDINGS: Lateral abdominal aortography demonstrates patency of the celiac and SMA.  RA 0 distal abdominal aortography demonstrates patency of the origin of the IMA.  Celiac angiography delineates anatomy. There is no evidence of active bleeding within the territory of the gastroduodenal artery or left gastric artery  Selective angiography of the common hepatic artery demonstrates patency of  the gastroduodenal artery.  Subsequent images demonstrate coil embolization of the gastroduodenal artery. 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  Injection of the SMA demonstrates patency of the branches but no source of active bleeding.  Angiography of the IMA over the pelvis and left upper quadrant demonstrates no evidence of bleeding from the descending or sigmoid colon.  IMPRESSION: Angiography of the celiac, SMA, and IMA demonstrates no active source of bleeding.  Empiric embolization of the gastroduodenal artery was performed.  The right femoral artery sheath was sewn in place in preparation for sheath removal after the INR is normalized.   Electronically Signed   By: Marybelle Killings M.D.   On: 11/11/2017 14:28   Labs:  CBC: Recent Labs    11/10/17 0435  11/11/17 0337 11/11/17 1819 11/12/17 0500 11/12/17 2133 11/13/17 0356  WBC 13.4*  --  12.6*  --  10.7*  --  8.9  HGB 6.7*   < > 8.1* 7.7* 7.5* 9.9* 9.5*  HCT 21.1*   < > 25.1* 23.4* 23.4* 31.3* 29.9*  PLT 266  --  229  --  222  --  215   < > = values in this interval not displayed.    COAGS: Recent Labs    11/10/17 0435 11/10/17 1752 11/11/17 0337 11/12/17 0500  INR 1.59 2.00 2.16 1.19    BMP: Recent Labs    11/09/17 0343 11/10/17 0435 11/11/17 0337 11/12/17 0500  NA 138 138 136 135  K 4.0 4.4 4.0 3.9  CL 109 109 110 107  CO2 24 24 20* 21*  GLUCOSE 119* 113* 105* 96  BUN 12 19 18 14   CALCIUM 8.2* 7.8* 7.5* 7.6*  CREATININE 1.03 1.04 0.97 0.88  GFRNONAA >60 >60 >60 >60  GFRAA >60 >60 >60 >60    LIVER FUNCTION TESTS: Recent Labs    11/05/17 1518 11/07/17 0517 11/08/17 0427 11/12/17 0500  BILITOT 0.9 1.0 1.2 0.7  AST 94* 137* 107* 56*  ALT 51* 79* 69* 46*  ALKPHOS 61 67 67 53  PROT 5.1* 5.6* 5.6* 4.6*  ALBUMIN 2.6* 2.9* 2.9* 2.5*    Assessment and Plan:  GI bleed s/p empiric embolization of GDA by Dr. Barbie Banner on 9/27 with successful  sheath removal on 9/29. Patient feels well overall, he is tolerating a regular diet - he is unsure if he is continuing to have active bleeding however per chart it appears he is having darks stools but no over bleeding. H/H 9.5/29.9 s/p transfusions on 9/22 and 9/28, anticoagulation is still being held and he continues on IV Protonix and PO carafate - per GI likely source of bleeding is multiple ulcers in stomach and small bowel.   Sheath removed successfully yesterday, no active bleeding from site. Further management and discharge per admitting service.  Please call IR with questions or concerns.  Electronically Signed: Joaquim Nam, PA-C 11/13/2017, 12:23 PM   I spent a total of 15 Minutes at the the patient's bedside AND on the patient's hospital floor or unit, greater than 50% of which was counseling/coordinating care for GDA embolization.

## 2017-11-14 LAB — CUP PACEART REMOTE DEVICE CHECK
Battery Remaining Percentage: 26 %
HighPow Impedance: 71 Ohm
HighPow Impedance: 71 Ohm
Implantable Lead Implant Date: 20140529
Implantable Lead Implant Date: 20140529
Implantable Lead Location: 753858
Implantable Lead Location: 753859
Implantable Pulse Generator Implant Date: 20140529
Lead Channel Impedance Value: 350 Ohm
Lead Channel Impedance Value: 400 Ohm
Lead Channel Impedance Value: 690 Ohm
Lead Channel Pacing Threshold Amplitude: 0.875 V
Lead Channel Pacing Threshold Amplitude: 1 V
Lead Channel Pacing Threshold Pulse Width: 0.5 ms
Lead Channel Pacing Threshold Pulse Width: 0.5 ms
Lead Channel Setting Pacing Amplitude: 2 V
Lead Channel Setting Sensing Sensitivity: 0.5 mV
MDC IDC LEAD IMPLANT DT: 20140529
MDC IDC LEAD LOCATION: 753860
MDC IDC MSMT BATTERY REMAINING LONGEVITY: 22 mo
MDC IDC MSMT BATTERY VOLTAGE: 2.9 V
MDC IDC MSMT LEADCHNL RA SENSING INTR AMPL: 1.9 mV
MDC IDC MSMT LEADCHNL RV SENSING INTR AMPL: 11.7 mV
MDC IDC PG SERIAL: 7097390
MDC IDC SESS DTM: 20190905060017
MDC IDC SET LEADCHNL LV PACING AMPLITUDE: 2 V
MDC IDC SET LEADCHNL LV PACING PULSEWIDTH: 0.5 ms
MDC IDC SET LEADCHNL RV PACING PULSEWIDTH: 0.5 ms

## 2017-11-14 LAB — CBC
HCT: 28.7 % — ABNORMAL LOW (ref 39.0–52.0)
HEMOGLOBIN: 9 g/dL — AB (ref 13.0–17.0)
MCH: 29.1 pg (ref 26.0–34.0)
MCHC: 31.4 g/dL (ref 30.0–36.0)
MCV: 92.9 fL (ref 78.0–100.0)
Platelets: 206 10*3/uL (ref 150–400)
RBC: 3.09 MIL/uL — AB (ref 4.22–5.81)
RDW: 17.5 % — ABNORMAL HIGH (ref 11.5–15.5)
WBC: 6.3 10*3/uL (ref 4.0–10.5)

## 2017-11-14 LAB — BASIC METABOLIC PANEL
Anion gap: 9 (ref 5–15)
BUN: 14 mg/dL (ref 8–23)
CHLORIDE: 108 mmol/L (ref 98–111)
CO2: 24 mmol/L (ref 22–32)
Calcium: 7.6 mg/dL — ABNORMAL LOW (ref 8.9–10.3)
Creatinine, Ser: 1.15 mg/dL (ref 0.61–1.24)
GFR calc Af Amer: >60 mL/min (ref >60)
GFR calc non Af Amer: 60 mL/min — ABNORMAL LOW (ref >60)
GLUCOSE: 118 mg/dL — AB (ref 70–99)
POTASSIUM: 3.5 mmol/L (ref 3.5–5.1)
SODIUM: 141 mmol/L (ref 135–145)

## 2017-11-14 LAB — HEPARIN LEVEL (UNFRACTIONATED): Heparin Unfractionated: 0.24 IU/mL — ABNORMAL LOW (ref 0.30–0.70)

## 2017-11-14 MED ORDER — HEPARIN (PORCINE) IN NACL 100-0.45 UNIT/ML-% IJ SOLN
1250.0000 [IU]/h | INTRAMUSCULAR | Status: DC
  Administered 2017-11-14: 1100 [IU]/h via INTRAVENOUS
  Administered 2017-11-15 – 2017-11-18 (×4): 1250 [IU]/h via INTRAVENOUS
  Filled 2017-11-14 (×5): qty 250

## 2017-11-14 NOTE — Progress Notes (Addendum)
ANTICOAGULATION CONSULT NOTE - Initial Consult  Pharmacy Consult for heparin Indication: Mechanical AVR + Afib  No Known Allergies  Patient Measurements: Height: 5\' 8"  (172.7 cm) Weight: 194 lb 3.6 oz (88.1 kg) IBW/kg (Calculated) : 68.4 Heparin Dosing Weight: 86.4 kg  Vital Signs: Temp: 98.4 F (36.9 C) (10/01 1111) Temp Source: Oral (10/01 1111) BP: 102/65 (10/01 1200) Pulse Rate: 83 (10/01 1200)  Labs: Recent Labs    11/12/17 0500 11/12/17 2133 11/13/17 0356 11/14/17 0439  HGB 7.5* 9.9* 9.5* 9.0*  HCT 23.4* 31.3* 29.9* 28.7*  PLT 222  --  215 206  LABPROT 15.0  --   --   --   INR 1.19  --   --   --   CREATININE 0.88  --   --  1.15    Estimated Creatinine Clearance: 58.1 mL/min (by C-G formula based on SCr of 1.15 mg/dL).   Medical History: Past Medical History:  Diagnosis Date  . AICD (automatic cardioverter/defibrillator) present 07/11/2012  . Aortic aneurysm, thoracic ([place])    Fusiform s/p Bentall procedure 2002  . Aortic valve, bicuspid    Mechanical AVR  . Basal cell carcinoma of face    "burned off" (11/06/2017)  . BPH (benign prostatic hypertrophy)   . CHF (congestive heart failure) ([place])    "once" (11/06/2017)  . Coronary atherosclerosis of native coronary artery    Nonobstructive at cardiac catherization 2002  . Essential hypertension, benign   . Left bundle branch block   . Mixed hyperlipidemia   . Nonischemic cardiomyopathy (H[facility])    CRT-D  . Persistent atrial fibrillation ([place])   . Presence of permanent cardiac pacemaker     Medications:  Scheduled:  . atorvastatin  10 mg Oral q1800  . cholecalciferol  1,000 Units Oral Daily  . ferrous sulfate  325 mg Oral QODAY  . metoprolol succinate  100 mg Oral BID  . omega-3 acid ethyl esters  1,000 mg Oral Daily  . pantoprazole  40 mg Intravenous Q12H  . sucralfate  1 g Oral TID WC & HS  . tamsulosin  0.4 mg Oral QHS  . vitamin C  500 mg Oral Daily   Infusions:  . sodium chloride Stopped  (11/12/17 2156)  . cefTRIAXone (ROCEPHIN)  IV Stopped (11/13/17 2219)  . heparin      Assessment: 34 yoF admitted on 9/22 with ICD shocks and now confirmed lower GIB. The patient was on warfarin PTA for history of mechanical AVR + Afib.   The patient's heparin drip has been on hold since 9/27 for a GI bleed. The patient's hemoglobin has remained stable since 9/30, so GI and Cardiology have cleared the patient to be reinitated on a low dose heparin drip. No bleeding has been noted. Will closely follow along for signs/symptoms of bleeding.  Goal of Therapy:  Heparin level 0.3 - 0.5 units/ml Monitor platelets by anticoagulation protocol: Yes   Plan:  Start heparin infusion at 1100 units/hr (no bolus)  Check anti-Xa level in 6 hours and daily while on heparin Monitor daily heparin level, CBC, and signs/symptoms of bleeding. Continue to monitor H&H and platelets  Thank you for allowing pharmacy to be a part of this patient's care.  Leron Croak, PharmD PGY1 Pharmacy Resident Phone: [phone]  Please check AMION for all [place] phone numbers 11/14/2017,1:35 PM

## 2017-11-14 NOTE — Progress Notes (Signed)
ANTICOAGULATION CONSULT NOTE - Follow-Up Consult  Pharmacy Consult for heparin Indication: Mechanical AVR + Afib  No Known Allergies  Patient Measurements: Height: 5\' 8"  (172.7 cm) Weight: 194 lb 3.6 oz (88.1 kg) IBW/kg (Calculated) : 68.4 Heparin Dosing Weight: 86.4 kg  Vital Signs: Temp: 97.8 F (36.6 C) (10/01 1948) Temp Source: Oral (10/01 1948) BP: 102/65 (10/01 1200) Pulse Rate: 83 (10/01 1200)  Labs: Recent Labs    11/12/17 0500 11/12/17 2133 11/13/17 0356 11/14/17 0439 11/14/17 2006  HGB 7.5* 9.9* 9.5* 9.0*  --   HCT 23.4* 31.3* 29.9* 28.7*  --   PLT 222  --  215 206  --   LABPROT 15.0  --   --   --   --   INR 1.19  --   --   --   --   HEPARINUNFRC  --   --   --   --  0.24*  CREATININE 0.88  --   --  1.15  --     Estimated Creatinine Clearance: 58.1 mL/min (by C-G formula based on SCr of 1.15 mg/dL).   Medical History: Past Medical History:  Diagnosis Date  . AICD (automatic cardioverter/defibrillator) present 07/11/2012  . Aortic aneurysm, thoracic ([place])    Fusiform s/p Bentall procedure 2002  . Aortic valve, bicuspid    Mechanical AVR  . Basal cell carcinoma of face    "burned off" (11/06/2017)  . BPH (benign prostatic hypertrophy)   . CHF (congestive heart failure) ([place])    "once" (11/06/2017)  . Coronary atherosclerosis of native coronary artery    Nonobstructive at cardiac catherization 2002  . Essential hypertension, benign   . Left bundle branch block   . Mixed hyperlipidemia   . Nonischemic cardiomyopathy (HCC)    CRT-D  . Persistent atrial fibrillation ([place])   . Presence of permanent cardiac pacemaker     Medications:  Scheduled:  . atorvastatin  10 mg Oral q1800  . cholecalciferol  1,000 Units Oral Daily  . ferrous sulfate  325 mg Oral QODAY  . metoprolol succinate  100 mg Oral BID  . omega-3 acid ethyl esters  1,000 mg Oral Daily  . pantoprazole  40 mg Intravenous Q12H  . sucralfate  1 g Oral TID WC & HS  . tamsulosin  0.4 mg  Oral QHS  . vitamin C  500 mg Oral Daily   Infusions:  . sodium chloride Stopped (11/12/17 2156)  . cefTRIAXone (ROCEPHIN)  IV Stopped (11/13/17 2219)  . heparin 1,100 Units/hr (11/14/17 1422)    Assessment: 45 yoF admitted on 9/22 with ICD shocks and now confirmed lower GIB. The patient was on warfarin PTA for history of mechanical AVR + Afib.   The patient's heparin drip has been on hold since 9/27 for a GI bleed. The patient's hemoglobin has remained stable since 9/30, so GI and Cardiology have cleared the patient to be reinitated on a low dose heparin drip. No bleeding has been noted. Will closely follow along for signs/symptoms of bleeding.  Initial heparin level slightly subtherapeutic at 0.24. Per RN no S/Sx bleeding noted thus far on shift.  Goal of Therapy:  Heparin level 0.3 - 0.5 units/ml Monitor platelets by anticoagulation protocol: Yes   Plan:  -Increase IV heparin to 1250 units/hr -Recheck 8hr heparin level with am labs -Monitor S/Sx bleeding closely  Arrie Senate, PharmD, BCPS Clinical Pharmacist [phone] Please check AMION for all [place] numbers 11/14/2017

## 2017-11-14 NOTE — Progress Notes (Addendum)
PROGRESS NOTE        PATIENT DETAILS Name: Timothy BLANCETT Sr. Age: 77 y.o. Sex: male Date of Birth: January 23, 1941 Admit Date: 11/05/2017 Admitting Physician Desiree Hane, MD ASN:KNLZ, Weldon Picking, MD  Brief Narrative:  Patient is a 77 years old male with past medical history of atrial fibrillation, mechanical aortic valve on warfarin, nonischemic cardia myopathy status post AICD presented to the hospital with complaints of melena.  EGD and colonoscopy were performed on 11/08/2017.  Esophageogastroduodenoscopy was performed which showed 3 large nonbleeding ulcers with discoloration at the bases.  Colonoscopy showed 2 polyps that were removed.  Has received a total of 5 units of PRBC.  GI, cardiology following-anticoagulation restarted with heparin today with close follow-up.  Subjective: Patient feels okay.  Denies any chest pain palpitation shortness of breath no nausea vomiting or abdominal pain.  Denies any hematemesis or melena.  Assessment/Plan:  Upper GI bleeding with acute blood loss anemia: Likely secondary to nonbleeding ulcers on the background of anticoagulation with Coumadin.  Patient underwent embolization of the gastroduodenal artery.  Continue on proton pump inhibitors.  IV heparin has been initiated today due to the presence of mechanical valve..  We will closely monitor CBC for any evidence of downtrending hemoglobin.    Atrial fibrillation: Rate controlled, continue metoprolol.  Heparin drip has been initiated.  Mechanical aortic valve replacement: Patient has been initiated on heparin drip today.  Chronic combined systolic/diastolic heart failure (EF 40-45% by TTE on 11/06/2017): Patient appears compensated at this time.  Diuretics on hold.  Hypertension: Blood pressure has started to improve.  On metoprolol.  We will closely monitor.  DVT Prophylaxis: SCD, Heparin drip has been initiated  Code Status: Full code  Family Communication: Spoke  with multiple family members at bedside and updated them about the clinical condition of the patient.  Disposition Plan:  Home once stable.  Antimicrobial agents: Anti-infectives (From admission, onward)   Start     Dose/Rate Route Frequency Ordered Stop   11/10/17 2200  cefTRIAXone (ROCEPHIN) 1 g in sodium chloride 0.9 % 100 mL IVPB     1 g 200 mL/hr over 30 Minutes Intravenous Every 24 hours 11/10/17 2146 11/15/17 2159      Procedures: 9/27>> mesenteric angiogram embolization of GDA  9/25>> EGD/colonoscopy  9/23>> echocardiogram: - Left ventricle: The cavity size was normal. Systolic function was   mildly to moderately reduced. The estimated ejection fraction was   in the range of 40% to 45%. - Aortic valve: Mildly calcified annulus. Moderately thickened,   moderately calcified leaflets. There was moderate stenosis. Valve   area (VTI): 1.59 cm^2. Valve area (Vmax): 1.61 cm^2. Valve area   (Vmean): 1.34 cm^2. - Mitral valve: There was mild regurgitation. - Left atrium: The atrium was moderately dilated. - Right atrium: The atrium was mildly dilated. - Pulmonary arteries: Systolic pressure was mildly increased. PA   peak pressure: 34 mm Hg (S).  CONSULTS:  cardiology and GI  Time spent: 32- minutes-Greater than 50% of this time was spent in counseling, explanation of diagnosis, planning of further management, and coordination of care.  MEDICATIONS: Scheduled Meds: . atorvastatin  10 mg Oral q1800  . cholecalciferol  1,000 Units Oral Daily  . ferrous sulfate  325 mg Oral QODAY  . metoprolol succinate  100 mg Oral BID  . omega-3 acid ethyl esters  1,000 mg Oral Daily  . pantoprazole  40 mg Intravenous Q12H  . sucralfate  1 g Oral TID WC & HS  . tamsulosin  0.4 mg Oral QHS  . vitamin C  500 mg Oral Daily   Continuous Infusions: . sodium chloride Stopped (11/12/17 2156)  . cefTRIAXone (ROCEPHIN)  IV Stopped (11/13/17 2219)  . heparin     PRN Meds:.sodium chloride,  acetaminophen, meclizine, senna-docusate, traMADol   PHYSICAL EXAM: Vital signs: Vitals:   11/14/17 1000 11/14/17 1100 11/14/17 1111 11/14/17 1200  BP: 127/89   102/65  Pulse: 99 99  83  Resp: (!) 24 19  20   Temp:   98.4 F (36.9 C)   TempSrc:   Oral   SpO2: 97% 96%  96%  Weight:      Height:       Filed Weights   11/11/17 0341 11/13/17 0329 11/14/17 0500  Weight: 89.6 kg 89.9 kg 88.1 kg   Body mass index is 29.53 kg/m.   Examination: General exam: Appears calm and comfortable ,Not in distress,average built HEENT:PERRL,Oral mucosa moist, Ear/Nose normal on gross exam Respiratory system: Bilateral equal air entry, normal vesicular breath sounds, no wheezes or crackles  Cardiovascular system: S1 & S2 heard, mechanical click noted, irregular rhythm,no pedal edema. Gastrointestinal system: Abdomen is nondistended, soft and nontender. No organomegaly or masses felt. Normal bowel sounds heard. Central nervous system: Alert and oriented. No focal neurological deficits. Extremities: No edema, no clubbing ,no cyanosis, distal peripheral pulses palpable. Skin: No rashes, lesions or ulcers,no icterus ,no pallor MSK: Normal muscle bulk,tone ,power Psychiatry: Judgement and insight appear normal. Mood & affect appropriate.    I have personally reviewed following labs and imaging studies  LABORATORY DATA: CBC: Recent Labs  Lab 11/10/17 0435  11/11/17 0337 11/11/17 1819 11/12/17 0500 11/12/17 2133 11/13/17 0356 11/14/17 0439  WBC 13.4*  --  12.6*  --  10.7*  --  8.9 6.3  HGB 6.7*   < > 8.1* 7.7* 7.5* 9.9* 9.5* 9.0*  HCT 21.1*   < > 25.1* 23.4* 23.4* 31.3* 29.9* 28.7*  MCV 93.8  --  91.3  --  94.4  --  91.7 92.9  PLT 266  --  229  --  222  --  215 206   < > = values in this interval not displayed.    Basic Metabolic Panel: Recent Labs  Lab 11/09/17 0343 11/10/17 0435 11/11/17 0337 11/12/17 0500 11/14/17 0439  NA 138 138 136 135 141  K 4.0 4.4 4.0 3.9 3.5  CL 109  109 110 107 108  CO2 24 24 20* 21* 24  GLUCOSE 119* 113* 105* 96 118*  BUN 12 19 18 14 14   CREATININE 1.03 1.04 0.97 0.88 1.15  CALCIUM 8.2* 7.8* 7.5* 7.6* 7.6*    GFR: Estimated Creatinine Clearance: 58.1 mL/min (by C-G formula based on SCr of 1.15 mg/dL).  Liver Function Tests: Recent Labs  Lab 11/08/17 0427 11/12/17 0500  AST 107* 56*  ALT 69* 46*  ALKPHOS 67 53  BILITOT 1.2 0.7  PROT 5.6* 4.6*  ALBUMIN 2.9* 2.5*   No results for input(s): LIPASE, AMYLASE in the last 168 hours. No results for input(s): AMMONIA in the last 168 hours.  Coagulation Profile: Recent Labs  Lab 11/09/17 0343 11/10/17 0435 11/10/17 1752 11/11/17 0337 11/12/17 0500  INR 1.29 1.59 2.00 2.16 1.19    Cardiac Enzymes: No results for input(s): CKTOTAL, CKMB, CKMBINDEX, TROPONINI in the last 168 hours.  BNP (  last 3 results) No results for input(s): PROBNP in the last 8760 hours.  HbA1C: No results for input(s): HGBA1C in the last 72 hours.  CBG: Recent Labs  Lab 11/10/17 2048  GLUCAP 111*    Lipid Profile: No results for input(s): CHOL, HDL, LDLCALC, TRIG, CHOLHDL, LDLDIRECT in the last 72 hours.  Thyroid Function Tests: No results for input(s): TSH, T4TOTAL, FREET4, T3FREE, THYROIDAB in the last 72 hours.  Anemia Panel: No results for input(s): VITAMINB12, FOLATE, FERRITIN, TIBC, IRON, RETICCTPCT in the last 72 hours.  Urine analysis:    Component Value Date/Time   COLORURINE AMBER (A) 04/17/2012 1751   APPEARANCEUR CLOUDY (A) 04/17/2012 1751   LABSPEC 1.027 04/17/2012 1751   PHURINE 6.0 04/17/2012 1751   GLUCOSEU NEGATIVE 04/17/2012 1751   HGBUR NEGATIVE 04/17/2012 1751   BILIRUBINUR SMALL (A) 04/17/2012 1751   KETONESUR 15 (A) 04/17/2012 1751   PROTEINUR 30 (A) 04/17/2012 1751   UROBILINOGEN 1.0 04/17/2012 1751   NITRITE NEGATIVE 04/17/2012 1751   LEUKOCYTESUR LARGE (A) 04/17/2012 1751    Sepsis Labs: Lactic Acid, Venous    Component Value Date/Time    LATICACIDVEN 1.2 04/17/2012 1709    MICROBIOLOGY: Recent Results (from the past 240 hour(s))  MRSA PCR Screening     Status: None   Collection Time: 11/05/17  2:08 PM  Result Value Ref Range Status   MRSA by PCR NEGATIVE NEGATIVE Final    Comment:        The GeneXpert MRSA Assay (FDA approved for NASAL specimens only), is one component of a comprehensive MRSA colonization surveillance program. It is not intended to diagnose MRSA infection nor to guide or monitor treatment for MRSA infections. Performed at [facility], [place] [address], [place], [place] [address]   Culture, Urine     Status: None   Collection Time: 11/10/17  9:59 PM  Result Value Ref Range Status   Specimen Description URINE, CATHETERIZED  Final   Special Requests NONE  Final   Culture   Final    NO GROWTH Performed at [facility], [address], [place], [place] [address]    Report Status 11/12/2017 FINAL  Final  Culture, blood (routine x 2)     Status: None (Preliminary result)   Collection Time: 11/10/17 11:12 PM  Result Value Ref Range Status   Specimen Description BLOOD RIGHT ANTECUBITAL  Final   Special Requests   Final    BOTTLES DRAWN AEROBIC AND ANAEROBIC Blood Culture adequate volume   Culture   Final    NO GROWTH 3 DAYS Performed at [facility], [place] [address], [place], [place] [address]    Report Status PENDING  Incomplete  Culture, blood (routine x 2)     Status: None (Preliminary result)   Collection Time: 11/10/17 11:19 PM  Result Value Ref Range Status   Specimen Description BLOOD RIGHT HAND  Final   Special Requests   Final    BOTTLES DRAWN AEROBIC AND ANAEROBIC Blood Culture adequate volume   Culture   Final    NO GROWTH 3 DAYS Performed at [facility], [place] [address] [place], [place] [address]    Report Status PENDING  Incomplete    RADIOLOGY STUDIES/RESULTS: Nm Gi Blood Loss  Result Date: 11/10/2017 CLINICAL DATA:  Melena/hematochezia this  morning. Recent endoscopy demonstrated antral ulcers and 3 large duodenal bulbs ulcers. Patient is on anticoagulation for mechanical valve and atrial fibrillation. EXAM: NUCLEAR MEDICINE GASTROINTESTINAL BLEEDING  SCAN TECHNIQUE: Sequential abdominal images were obtained following intravenous administration of Tc-68m labeled red blood cells. RADIOPHARMACEUTICALS:  26.3 mCi Tc-18m pertechnetate in-vitro labeled red cells. COMPARISON:  None. FINDINGS: There is a small focus abnormal radiotracer activity which appears to which to originate within the proximal small bowel. The overall amount of abnormal radiotracer activity is very small suggesting a small, transient bleed. Physiologic activity is seen within the heart, liver, urinary bladder, abdominal aorta and pelvic vasculature. IMPRESSION: Suspected transient proximal small bowel bleed. Critical Value/emergent results were called by telephone at the time of interpretation on 11/10/2017 at 1636 to Dr. Ronnette Juniper , who verbally acknowledged these results. Electronically Signed   By: Sandi Mariscal M.D.   On: 11/10/2017 16:35   Ir Angiogram Visceral Selective  Result Date: 11/11/2017 INDICATION: Gastrointestinal bleeding EXAM: MESENTERIC ANGIOGRAM AND GASTRODUODENAL ARTERY EMBOLIZATION. MEDICATIONS: NONE ANESTHESIA/SEDATION: Moderate (conscious) sedation was employed during this procedure. A total of Versed 1 mg and Fentanyl 50 mcg was administered intravenously. Moderate Sedation Time: 76 minutes. The patient's level of consciousness and vital signs were monitored continuously by radiology nursing throughout the procedure under my direct supervision. CONTRAST:  170 CC ISOVUE-300 FLUOROSCOPY TIME:  Fluoroscopy Time: 13 minutes 30 seconds (815 mGy). COMPLICATIONS: None immediate. PROCEDURE: Informed consent was obtained from the patient following explanation of the procedure, risks, benefits and alternatives. The patient understands, agrees and consents for the  procedure. All questions were addressed. A time out was performed prior to the initiation of the procedure. Maximal barrier sterile technique utilized including caps, mask, sterile gowns, sterile gloves, large sterile drape, hand hygiene, and Betadine prep. The right groin was prepped and draped in a sterile fashion. 1% lidocaine was utilized for local anesthesia. Under sonographic guidance, a micropuncture needle was inserted into the right common femoral artery and removed over a 018 wire. This was up sized to a Bentson. The common femoral artery was noted to be patent. Sonographic documentation was obtained A 5 French sheath was inserted. A 5 French pigtail catheter was advanced into the aorta. Lateral abdominal aortography was performed for the origins of the celiac and SMA. The catheter was retracted. RA 0 distal abdominal aortography was performed for the origin of the IMA. The pigtail was exchanged for a cobra 2 catheter. The celiac was selected. Angiography was performed. This was advanced over a glidewire into the common hepatic artery. Angiography was performed. A Renegade microcatheter was advanced over a 016 fathom wire into the gastroduodenal artery and angiography was performed. The catheter was then advanced over the wire into the distal gastroduodenal artery. Embolization was performed utilizing 3 mm and 4 mm diameter interlock coils for total embolization of the gastroduodenal artery. The microcatheter was retracted into the common hepatic artery and repeat angiography was performed. The Cobra 2 catheter was aspirated and flushed. It was retracted into the abdominal aorta. It was advanced over a Bentson wire into the SMA and angiography was performed. It was then retracted and advanced over a Bentson wire into the IMA and angiography was performed. The catheter was then advanced into the right external iliac artery and right RA 0 femoral angiography was performed. The sheath was flushed and sewn in  place.  The patient's INR is 2.1. FINDINGS: Lateral abdominal aortography demonstrates patency of the celiac and SMA. RA 0 distal abdominal aortography demonstrates patency of the origin of the IMA. Celiac angiography delineates anatomy. There is no evidence of active bleeding within the territory of the gastroduodenal artery or left  gastric artery Selective angiography of the common hepatic artery demonstrates patency of the gastroduodenal artery. Subsequent images demonstrate coil embolization of the gastroduodenal artery. 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 Injection of the SMA demonstrates patency of the branches but no source of active bleeding. Angiography of the IMA over the pelvis and left upper quadrant demonstrates no evidence of bleeding from the descending or sigmoid colon. IMPRESSION: Angiography of the celiac, SMA, and IMA demonstrates no active source of bleeding. Empiric embolization of the gastroduodenal artery was performed. The right femoral artery sheath was sewn in place in preparation for sheath removal after the INR is normalized. Electronically Signed   By: Marybelle Killings M.D.   On: 11/11/2017 14:28   Ir Angiogram Visceral Selective  Result Date: 11/11/2017 INDICATION: Gastrointestinal bleeding EXAM: MESENTERIC ANGIOGRAM AND GASTRODUODENAL ARTERY EMBOLIZATION. MEDICATIONS: NONE ANESTHESIA/SEDATION: Moderate (conscious) sedation was employed during this procedure. A total of Versed 1 mg and Fentanyl 50 mcg was administered intravenously. Moderate Sedation Time: 76 minutes. The patient's level of consciousness and vital signs were monitored continuously by radiology nursing throughout the procedure under my direct supervision. CONTRAST:  170 CC ISOVUE-300 FLUOROSCOPY TIME:  Fluoroscopy Time: 13 minutes 30 seconds (815 mGy). COMPLICATIONS: None immediate. PROCEDURE: Informed consent was obtained from the patient following  explanation of the procedure, risks, benefits and alternatives. The patient understands, agrees and consents for the procedure. All questions were addressed. A time out was performed prior to the initiation of the procedure. Maximal barrier sterile technique utilized including caps, mask, sterile gowns, sterile gloves, large sterile drape, hand hygiene, and Betadine prep. The right groin was prepped and draped in a sterile fashion. 1% lidocaine was utilized for local anesthesia. Under sonographic guidance, a micropuncture needle was inserted into the right common femoral artery and removed over a 018 wire. This was up sized to a Bentson. The common femoral artery was noted to be patent. Sonographic documentation was obtained A 5 French sheath was inserted. A 5 French pigtail catheter was advanced into the aorta. Lateral abdominal aortography was performed for the origins of the celiac and SMA. The catheter was retracted. RA 0 distal abdominal aortography was performed for the origin of the IMA. The pigtail was exchanged for a cobra 2 catheter. The celiac was selected. Angiography was performed. This was advanced over a glidewire into the common hepatic artery. Angiography was performed. A Renegade microcatheter was advanced over a 016 fathom wire into the gastroduodenal artery and angiography was performed. The catheter was then advanced over the wire into the distal gastroduodenal artery. Embolization was performed utilizing 3 mm and 4 mm diameter interlock coils for total embolization of the gastroduodenal artery. The microcatheter was retracted into the common hepatic artery and repeat angiography was performed. The Cobra 2 catheter was aspirated and flushed. It was retracted into the abdominal aorta. It was advanced over a Bentson wire into the SMA and angiography was performed. It was then retracted and advanced over a Bentson wire into the IMA and angiography was performed. The catheter was then advanced into  the right external iliac artery and right RA 0 femoral angiography was performed. The sheath was flushed and sewn in place.  The patient's INR is 2.1. FINDINGS: Lateral abdominal aortography demonstrates patency of the celiac and SMA. RA 0 distal abdominal aortography demonstrates patency of the origin of the IMA. Celiac angiography delineates anatomy. There is no evidence of active bleeding within the territory of the gastroduodenal  artery or left gastric artery Selective angiography of the common hepatic artery demonstrates patency of the gastroduodenal artery. Subsequent images demonstrate coil embolization of the gastroduodenal artery. 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 Injection of the SMA demonstrates patency of the branches but no source of active bleeding. Angiography of the IMA over the pelvis and left upper quadrant demonstrates no evidence of bleeding from the descending or sigmoid colon. IMPRESSION: Angiography of the celiac, SMA, and IMA demonstrates no active source of bleeding. Empiric embolization of the gastroduodenal artery was performed. The right femoral artery sheath was sewn in place in preparation for sheath removal after the INR is normalized. Electronically Signed   By: Marybelle Killings M.D.   On: 11/11/2017 14:28   Ir Angiogram Visceral Selective  Result Date: 11/11/2017 INDICATION: Gastrointestinal bleeding EXAM: MESENTERIC ANGIOGRAM AND GASTRODUODENAL ARTERY EMBOLIZATION. MEDICATIONS: NONE ANESTHESIA/SEDATION: Moderate (conscious) sedation was employed during this procedure. A total of Versed 1 mg and Fentanyl 50 mcg was administered intravenously. Moderate Sedation Time: 76 minutes. The patient's level of consciousness and vital signs were monitored continuously by radiology nursing throughout the procedure under my direct supervision. CONTRAST:  170 CC ISOVUE-300 FLUOROSCOPY TIME:  Fluoroscopy Time: 13 minutes 30  seconds (815 mGy). COMPLICATIONS: None immediate. PROCEDURE: Informed consent was obtained from the patient following explanation of the procedure, risks, benefits and alternatives. The patient understands, agrees and consents for the procedure. All questions were addressed. A time out was performed prior to the initiation of the procedure. Maximal barrier sterile technique utilized including caps, mask, sterile gowns, sterile gloves, large sterile drape, hand hygiene, and Betadine prep. The right groin was prepped and draped in a sterile fashion. 1% lidocaine was utilized for local anesthesia. Under sonographic guidance, a micropuncture needle was inserted into the right common femoral artery and removed over a 018 wire. This was up sized to a Bentson. The common femoral artery was noted to be patent. Sonographic documentation was obtained A 5 French sheath was inserted. A 5 French pigtail catheter was advanced into the aorta. Lateral abdominal aortography was performed for the origins of the celiac and SMA. The catheter was retracted. RA 0 distal abdominal aortography was performed for the origin of the IMA. The pigtail was exchanged for a cobra 2 catheter. The celiac was selected. Angiography was performed. This was advanced over a glidewire into the common hepatic artery. Angiography was performed. A Renegade microcatheter was advanced over a 016 fathom wire into the gastroduodenal artery and angiography was performed. The catheter was then advanced over the wire into the distal gastroduodenal artery. Embolization was performed utilizing 3 mm and 4 mm diameter interlock coils for total embolization of the gastroduodenal artery. The microcatheter was retracted into the common hepatic artery and repeat angiography was performed. The Cobra 2 catheter was aspirated and flushed. It was retracted into the abdominal aorta. It was advanced over a Bentson wire into the SMA and angiography was performed. It was then  retracted and advanced over a Bentson wire into the IMA and angiography was performed. The catheter was then advanced into the right external iliac artery and right RA 0 femoral angiography was performed. The sheath was flushed and sewn in place.  The patient's INR is 2.1. FINDINGS: Lateral abdominal aortography demonstrates patency of the celiac and SMA. RA 0 distal abdominal aortography demonstrates patency of the origin of the IMA. Celiac angiography delineates anatomy. There is no evidence of active bleeding within the territory  of the gastroduodenal artery or left gastric artery Selective angiography of the common hepatic artery demonstrates patency of the gastroduodenal artery. Subsequent images demonstrate coil embolization of the gastroduodenal artery. 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 Injection of the SMA demonstrates patency of the branches but no source of active bleeding. Angiography of the IMA over the pelvis and left upper quadrant demonstrates no evidence of bleeding from the descending or sigmoid colon. IMPRESSION: Angiography of the celiac, SMA, and IMA demonstrates no active source of bleeding. Empiric embolization of the gastroduodenal artery was performed. The right femoral artery sheath was sewn in place in preparation for sheath removal after the INR is normalized. Electronically Signed   By: Marybelle Killings M.D.   On: 11/11/2017 14:28   Ir Angiogram Selective Each Additional Vessel  Result Date: 11/11/2017 INDICATION: Gastrointestinal bleeding EXAM: MESENTERIC ANGIOGRAM AND GASTRODUODENAL ARTERY EMBOLIZATION. MEDICATIONS: NONE ANESTHESIA/SEDATION: Moderate (conscious) sedation was employed during this procedure. A total of Versed 1 mg and Fentanyl 50 mcg was administered intravenously. Moderate Sedation Time: 76 minutes. The patient's level of consciousness and vital signs were monitored continuously by radiology nursing  throughout the procedure under my direct supervision. CONTRAST:  170 CC ISOVUE-300 FLUOROSCOPY TIME:  Fluoroscopy Time: 13 minutes 30 seconds (815 mGy). COMPLICATIONS: None immediate. PROCEDURE: Informed consent was obtained from the patient following explanation of the procedure, risks, benefits and alternatives. The patient understands, agrees and consents for the procedure. All questions were addressed. A time out was performed prior to the initiation of the procedure. Maximal barrier sterile technique utilized including caps, mask, sterile gowns, sterile gloves, large sterile drape, hand hygiene, and Betadine prep. The right groin was prepped and draped in a sterile fashion. 1% lidocaine was utilized for local anesthesia. Under sonographic guidance, a micropuncture needle was inserted into the right common femoral artery and removed over a 018 wire. This was up sized to a Bentson. The common femoral artery was noted to be patent. Sonographic documentation was obtained A 5 French sheath was inserted. A 5 French pigtail catheter was advanced into the aorta. Lateral abdominal aortography was performed for the origins of the celiac and SMA. The catheter was retracted. RA 0 distal abdominal aortography was performed for the origin of the IMA. The pigtail was exchanged for a cobra 2 catheter. The celiac was selected. Angiography was performed. This was advanced over a glidewire into the common hepatic artery. Angiography was performed. A Renegade microcatheter was advanced over a 016 fathom wire into the gastroduodenal artery and angiography was performed. The catheter was then advanced over the wire into the distal gastroduodenal artery. Embolization was performed utilizing 3 mm and 4 mm diameter interlock coils for total embolization of the gastroduodenal artery. The microcatheter was retracted into the common hepatic artery and repeat angiography was performed. The Cobra 2 catheter was aspirated and flushed. It was  retracted into the abdominal aorta. It was advanced over a Bentson wire into the SMA and angiography was performed. It was then retracted and advanced over a Bentson wire into the IMA and angiography was performed. The catheter was then advanced into the right external iliac artery and right RA 0 femoral angiography was performed. The sheath was flushed and sewn in place.  The patient's INR is 2.1. FINDINGS: Lateral abdominal aortography demonstrates patency of the celiac and SMA. RA 0 distal abdominal aortography demonstrates patency of the origin of the IMA. Celiac angiography delineates anatomy. There is no evidence of  active bleeding within the territory of the gastroduodenal artery or left gastric artery Selective angiography of the common hepatic artery demonstrates patency of the gastroduodenal artery. Subsequent images demonstrate coil embolization of the gastroduodenal artery. 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 Injection of the SMA demonstrates patency of the branches but no source of active bleeding. Angiography of the IMA over the pelvis and left upper quadrant demonstrates no evidence of bleeding from the descending or sigmoid colon. IMPRESSION: Angiography of the celiac, SMA, and IMA demonstrates no active source of bleeding. Empiric embolization of the gastroduodenal artery was performed. The right femoral artery sheath was sewn in place in preparation for sheath removal after the INR is normalized. Electronically Signed   By: Marybelle Killings M.D.   On: 11/11/2017 14:28   Ir US Guide Vasc Access Right  Result Date: 11/11/2017 INDICATION: Gastrointestinal bleeding EXAM: MESENTERIC ANGIOGRAM AND GASTRODUODENAL ARTERY EMBOLIZATION. MEDICATIONS: NONE ANESTHESIA/SEDATION: Moderate (conscious) sedation was employed during this procedure. A total of Versed 1 mg and Fentanyl 50 mcg was administered intravenously. Moderate Sedation Time: 76  minutes. The patient's level of consciousness and vital signs were monitored continuously by radiology nursing throughout the procedure under my direct supervision. CONTRAST:  170 CC ISOVUE-300 FLUOROSCOPY TIME:  Fluoroscopy Time: 13 minutes 30 seconds (815 mGy). COMPLICATIONS: None immediate. PROCEDURE: Informed consent was obtained from the patient following explanation of the procedure, risks, benefits and alternatives. The patient understands, agrees and consents for the procedure. All questions were addressed. A time out was performed prior to the initiation of the procedure. Maximal barrier sterile technique utilized including caps, mask, sterile gowns, sterile gloves, large sterile drape, hand hygiene, and Betadine prep. The right groin was prepped and draped in a sterile fashion. 1% lidocaine was utilized for local anesthesia. Under sonographic guidance, a micropuncture needle was inserted into the right common femoral artery and removed over a 018 wire. This was up sized to a Bentson. The common femoral artery was noted to be patent. Sonographic documentation was obtained A 5 French sheath was inserted. A 5 French pigtail catheter was advanced into the aorta. Lateral abdominal aortography was performed for the origins of the celiac and SMA. The catheter was retracted. RA 0 distal abdominal aortography was performed for the origin of the IMA. The pigtail was exchanged for a cobra 2 catheter. The celiac was selected. Angiography was performed. This was advanced over a glidewire into the common hepatic artery. Angiography was performed. A Renegade microcatheter was advanced over a 016 fathom wire into the gastroduodenal artery and angiography was performed. The catheter was then advanced over the wire into the distal gastroduodenal artery. Embolization was performed utilizing 3 mm and 4 mm diameter interlock coils for total embolization of the gastroduodenal artery. The microcatheter was retracted into the  common hepatic artery and repeat angiography was performed. The Cobra 2 catheter was aspirated and flushed. It was retracted into the abdominal aorta. It was advanced over a Bentson wire into the SMA and angiography was performed. It was then retracted and advanced over a Bentson wire into the IMA and angiography was performed. The catheter was then advanced into the right external iliac artery and right RA 0 femoral angiography was performed. The sheath was flushed and sewn in place.  The patient's INR is 2.1. FINDINGS: Lateral abdominal aortography demonstrates patency of the celiac and SMA. RA 0 distal abdominal aortography demonstrates patency of the origin of the IMA. Celiac angiography delineates anatomy.  There is no evidence of active bleeding within the territory of the gastroduodenal artery or left gastric artery Selective angiography of the common hepatic artery demonstrates patency of the gastroduodenal artery. Subsequent images demonstrate coil embolization of the gastroduodenal artery. 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 Injection of the SMA demonstrates patency of the branches but no source of active bleeding. Angiography of the IMA over the pelvis and left upper quadrant demonstrates no evidence of bleeding from the descending or sigmoid colon. IMPRESSION: Angiography of the celiac, SMA, and IMA demonstrates no active source of bleeding. Empiric embolization of the gastroduodenal artery was performed. The right femoral artery sheath was sewn in place in preparation for sheath removal after the INR is normalized. Electronically Signed   By: Marybelle Killings M.D.   On: 11/11/2017 14:28   [place] Guide Roadmapping  Result Date: 11/13/2017 INDICATION: Gastrointestinal bleeding  EXAM: MESENTERIC ANGIOGRAM AND GASTRODUODENAL ARTERY EMBOLIZATION.  MEDICATIONS: NONE  ANESTHESIA/SEDATION: Moderate  (conscious) sedation was employed during this procedure. A total of Versed 1 mg and Fentanyl 50 mcg was administered intravenously.  Moderate Sedation Time: 76 minutes. The patient's level of consciousness and vital signs were monitored continuously by radiology nursing throughout the procedure under my direct supervision.  CONTRAST:  170 CC ISOVUE-300  FLUOROSCOPY TIME:  Fluoroscopy Time: 13 minutes 30 seconds (815 mGy).  COMPLICATIONS: None immediate.  PROCEDURE: Informed consent was obtained from the patient following explanation of the procedure, risks, benefits and alternatives. The patient understands, agrees and consents for the procedure. All questions were addressed. A time out was performed prior to the initiation of the procedure. Maximal barrier sterile technique utilized including caps, mask, sterile gowns, sterile gloves, large sterile drape, hand hygiene, and Betadine prep.  The right groin was prepped and draped in a sterile fashion. 1% lidocaine was utilized for local anesthesia. Under sonographic guidance, a micropuncture needle was inserted into the right common femoral artery and removed over a 018 wire. This was up sized to a Bentson. The common femoral artery was noted to be patent. Sonographic documentation was obtained  A 5 French sheath was inserted. A 5 French pigtail catheter was advanced into the aorta. Lateral abdominal aortography was performed for the origins of the celiac and SMA. The catheter was retracted. RA 0 distal abdominal aortography was performed for the origin of the IMA.  The pigtail was exchanged for a cobra 2 catheter. The celiac was selected. Angiography was performed. This was advanced over a glidewire into the common hepatic artery. Angiography was performed. A Renegade microcatheter was advanced over a 016 fathom wire into the gastroduodenal artery and angiography was performed. The catheter was then advanced over the wire into the distal gastroduodenal  artery. Embolization was performed utilizing 3 mm and 4 mm diameter interlock coils for total embolization of the gastroduodenal artery. The microcatheter was retracted into the common hepatic artery and repeat angiography was performed.  The Cobra 2 catheter was aspirated and flushed. It was retracted into the abdominal aorta. It was advanced over a Bentson wire into the SMA and angiography was performed.  It was then retracted and advanced over a Bentson wire into the IMA and angiography was performed.  The catheter was then advanced into the right external iliac artery and right RA 0 femoral angiography was performed.  The sheath was flushed and sewn in place.  The patient's INR is 2.1.  FINDINGS:  Lateral abdominal aortography demonstrates patency of the celiac and SMA.  RA 0 distal abdominal aortography demonstrates patency of the origin of the IMA.  Celiac angiography delineates anatomy. There is no evidence of active bleeding within the territory of the gastroduodenal artery or left gastric artery  Selective angiography of the common hepatic artery demonstrates patency of the gastroduodenal artery.  Subsequent images demonstrate coil embolization of the gastroduodenal artery. 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  Injection of the SMA demonstrates patency of the branches but no source of active bleeding.  Angiography of the IMA over the pelvis and left upper quadrant demonstrates no evidence of bleeding from the descending or sigmoid colon.  IMPRESSION: Angiography of the celiac, SMA, and IMA demonstrates no active source of bleeding.  Empiric embolization of the gastroduodenal artery was performed.  The right femoral artery sheath was sewn in place in preparation for sheath removal after the INR is normalized.   Electronically Signed   By: Marybelle Killings M.D.   On: 11/11/2017 14:28    LOS: 9 days   Flora Lipps, MD  [facility]  If 7PM-7AM, please contact night-coverage  Please page via [email or web address] [email or web address]-click on MD name and type text message  11/14/2017, 2:21 PM

## 2017-11-14 NOTE — Progress Notes (Addendum)
Progress Note  Patient Name: Timothy PROFFIT Sr. Date of Encounter: 11/14/2017  Primary Cardiologist: Rozann Lesches, MD  Subjective   Has not had a bowel movement overnight, so unclear if stools are bloody or dark. Feels well otherwise, eager to leave hospital when he is well.   Inpatient Medications    Scheduled Meds: . atorvastatin  10 mg Oral q1800  . cholecalciferol  1,000 Units Oral Daily  . ferrous sulfate  325 mg Oral QODAY  . metoprolol succinate  100 mg Oral BID  . omega-3 acid ethyl esters  1,000 mg Oral Daily  . pantoprazole  40 mg Intravenous Q12H  . sucralfate  1 g Oral TID WC & HS  . tamsulosin  0.4 mg Oral QHS  . vitamin C  500 mg Oral Daily   Continuous Infusions: . sodium chloride Stopped (11/12/17 2156)  . cefTRIAXone (ROCEPHIN)  IV Stopped (11/13/17 2219)   PRN Meds: sodium chloride, acetaminophen, meclizine, senna-docusate, traMADol   Vital Signs    Vitals:   11/14/17 0600 11/14/17 0700 11/14/17 0740 11/14/17 0800  BP: (!) 80/52 (!) 90/47  (!) 90/47  Pulse: 80 81  86  Resp: 19 17  (!) 21  Temp:   97.6 F (36.4 C)   TempSrc:   Oral   SpO2: 97% 97%  97%  Weight:      Height:        Intake/Output Summary (Last 24 hours) at 11/14/2017 0807 Last data filed at 11/14/2017 0800 Gross per 24 hour  Intake 189.79 ml  Output 700 ml  Net -510.21 ml   Filed Weights   11/11/17 0341 11/13/17 0329 11/14/17 0500  Weight: 89.6 kg 89.9 kg 88.1 kg    Telemetry    Intermittent biventricular pacing with wide complexes possibly representing afib with aberrant conduction vs ventricular ectopy- Personally Reviewed  ECG    Intermittent biventricular pacing with wide complexes possibly representing afib with aberrant conduction vs ventricular ectopy - 11/13/17  Physical Exam   GEN: No acute distress, lying comfortably in bed.   Neck: No JVD Cardiac: irregular rhythm, rate 80s-90s. Crisp mechanical valve closure sound at S2. No murmurs.  Respiratory:  Clear to auscultation bilaterally. GI: Soft, nontender, non-distended  MS: No edema; No deformity. SCDs in place Neuro:  Nonfocal  Psych: Normal affect   Labs    Chemistry Recent Labs  Lab 11/08/17 0427  11/11/17 0337 11/12/17 0500 11/14/17 0439  NA 139   < > 136 135 141  K 4.1   < > 4.0 3.9 3.5  CL 107   < > 110 107 108  CO2 25   < > 20* 21* 24  GLUCOSE 99   < > 105* 96 118*  BUN 15   < > 18 14 14   CREATININE 1.02   < > 0.97 0.88 1.15  CALCIUM 8.2*   < > 7.5* 7.6* 7.6*  PROT 5.6*  --   --  4.6*  --   ALBUMIN 2.9*  --   --  2.5*  --   AST 107*  --   --  56*  --   ALT 69*  --   --  46*  --   ALKPHOS 67  --   --  53  --   BILITOT 1.2  --   --  0.7  --   GFRNONAA >60   < > >60 >60 60*  GFRAA >60   < > >60 >60 >60  ANIONGAP 7   < >  6 7 9    < > = values in this interval not displayed.     Hematology Recent Labs  Lab 11/12/17 0500 11/12/17 2133 11/13/17 0356 11/14/17 0439  WBC 10.7*  --  8.9 6.3  RBC 2.48*  --  3.26* 3.09*  HGB 7.5* 9.9* 9.5* 9.0*  HCT 23.4* 31.3* 29.9* 28.7*  MCV 94.4  --  91.7 92.9  MCH 30.2  --  29.1 29.1  MCHC 32.1  --  31.8 31.4  RDW 16.3*  --  17.6* 17.5*  PLT 222  --  215 206    Cardiac EnzymesNo results for input(s): TROPONINI in the last 168 hours. No results for input(s): TROPIPOC in the last 168 hours.   BNPNo results for input(s): BNP, PROBNP in the last 168 hours.   DDimer No results for input(s): DDIMER in the last 168 hours.   Radiology    No results found.  Cardiac Studies   Echocardiogram: - Left ventricle: The cavity size was normal. Systolic function was   mildly to moderately reduced. The estimated ejection fraction was   in the range of 40% to 45%. - Aortic valve: Mildly calcified annulus. Moderately thickened,   moderately calcified leaflets. There was moderate stenosis. Valve   area (VTI): 1.59 cm^2. Valve area (Vmax): 1.61 cm^2. Valve area   (Vmean): 1.34 cm^2. - Mitral valve: There was mild regurgitation. -  Left atrium: The atrium was moderately dilated. - Right atrium: The atrium was mildly dilated. - Pulmonary arteries: Systolic pressure was mildly increased. PA   peak pressure: 34 mm Hg (S).  Patient Profile     77 y.o. malewith a hx of aortic aneurysm status post Bentall procedure in 2002 with mechanical AVR, coronary artery disease nonobstructive in 2002, hypertension, hyperlipidemia, nonischemic cardiomyopathy status post Saint Jude CRT-D in 2014, and persistent atrial fibrillationwho was admitted after multipleinappropriate ICD shocks, due to rapid afib.  Patient found to have a GI bleed.  He was found to have duodenal ulcers, polyps and diverticulosis with GI evaluation.  Recurrent bleeding and nuclear scan showed bleeding from proximal small bowel versus duodenum.  Had empiric embolization of GDA. Last echocardiogram September 2019 showed ejection fraction 40 to 45%, mechanical aortic valve with mean gradient 15 mmHg, mild mitral regurgitation and biatrial enlargement.  Assessment & Plan    Principal Problem:   GI bleed Active Problems:   Essential hypertension, benign   Atrial fibrillation with rapid ventricular response (HCC) -status post cardioversion   S/P aortic valve replacement with metallic valve   Chronic combined systolic and diastolic heart failure (HCC)   ICD (implantable cardioverter-defibrillator) in place   Supratherapeutic INR   Acute blood loss anemia  Mr. Azure has received blood products and has had an appropriate rise in his hemoglobin, which was stable overnight at 9 g/dL. His valve has a crisp closure sound, so at present it seems to be functioning well.   If GI and CCM believe he is stable to initiate a low dose of heparin, it would be reasonable to consider that today or tomorrow. Ideally, this could be started in the morning to observe for bleeding during the day as opposed to overnight.  Cardiology will follow along.  For questions or updates, please  contact [facility] Please consult [email or web address] for contact info under      Signed, Elouise Munroe, MD  11/14/2017, 8:07 AM    ADDENDUM: We will cautiously start IV heparin for anticoagulation for mechanical aortic valve, and follow  heparin levels, as dictated by our pharmacist colleagues. Overnight team should be alert to any change in hemodynamic status as this may represent bleeding.  Elouise Munroe  11/14/17 12:19 PM

## 2017-11-14 NOTE — Progress Notes (Signed)
[facility] PROGRESS NOTE Subjective No gross bleeding.  Feels fine   Objective: Vital signs in last 24 hours: Temp:  [97.6 F (36.4 C)-98.7 F (37.1 C)] 98.4 F (36.9 C) (10/01 1111) Pulse Rate:  [78-154] 99 (10/01 1000) Resp:  [16-24] 24 (10/01 1000) BP: (80-127)/(47-89) 127/89 (10/01 1000) SpO2:  [95 %-100 %] 97 % (10/01 1000) Weight:  [88.1 kg] 88.1 kg (10/01 0500) Last BM Date: 11/12/17  Intake/Output from previous day: 09/30 0701 - 10/01 0700 In: 212.6 [I.V.:112.5; IV Piggyback:100.1] Out: 550 [Urine:550] Intake/Output this shift: Total I/O In: 120 [P.O.:120] Out: 400 [Urine:400]  PE: General--watching television no distress  heart--mechanical heart sound  Abdomen--soft and nontender  Lab Results: Recent Labs    11/11/17 1819 11/12/17 0500 11/12/17 2133 11/13/17 0356 11/14/17 0439  WBC  --  10.7*  --  8.9 6.3  HGB 7.7* 7.5* 9.9* 9.5* 9.0*  HCT 23.4* 23.4* 31.3* 29.9* 28.7*  PLT  --  222  --  215 206   BMET Recent Labs    11/12/17 0500 11/14/17 0439  NA 135 141  K 3.9 3.5  CL 107 108  CO2 21* 24  CREATININE 0.88 1.15   LFT Recent Labs    11/12/17 0500  PROT 4.6*  AST 56*  ALT 46*  ALKPHOS 53  BILITOT 0.7   PT/INR Recent Labs    11/12/17 0500  LABPROT 15.0  INR 1.19   PANCREAS No results for input(s): LIPASE in the last 72 hours.       Studies/Results: No results found.  Medications: I have reviewed the patient's current medications.  Assessment:   1.  GI bleeding.  This is almost certainly due to the multiple gastric and duodenal ulcers found on EGD 6 days ago.  Patient's hemoglobin is stable and is having no gross bleeding.  He has been off of anticoagulation and we need to get this restarted as soon as feasible.  He is on Protonix and Carafate.   Plan: 1.  I agree with Dr.Acharya that starting back on low-dose heparin IV would be very reasonable.  Certainly we can watch him closely and hold the heparin  infusion if he begins to bleed again.  We will leave it up to the hospitalist and the cardiologist to start back the heparin.Nancy Fetter 11/14/2017, 11:36 AM  This note was created using voice recognition software. Minor errors may Have occurred unintentionally.  Pager: [phone] If no answer or after hours call [phone]

## 2017-11-15 DIAGNOSIS — K284 Chronic or unspecified gastrojejunal ulcer with hemorrhage: Secondary | ICD-10-CM

## 2017-11-15 LAB — H. PYLORI ANTIGEN, STOOL: H. Pylori Stool Ag, Eia: NEGATIVE

## 2017-11-15 LAB — CBC
HEMATOCRIT: 28.4 % — AB (ref 39.0–52.0)
Hemoglobin: 8.9 g/dL — ABNORMAL LOW (ref 13.0–17.0)
MCH: 29.3 pg (ref 26.0–34.0)
MCHC: 31.3 g/dL (ref 30.0–36.0)
MCV: 93.4 fL (ref 78.0–100.0)
Platelets: 217 10*3/uL (ref 150–400)
RBC: 3.04 MIL/uL — ABNORMAL LOW (ref 4.22–5.81)
RDW: 17.2 % — AB (ref 11.5–15.5)
WBC: 5.8 10*3/uL (ref 4.0–10.5)

## 2017-11-15 LAB — BASIC METABOLIC PANEL
Anion gap: 6 (ref 5–15)
BUN: 12 mg/dL (ref 8–23)
CALCIUM: 8.1 mg/dL — AB (ref 8.9–10.3)
CO2: 26 mmol/L (ref 22–32)
CREATININE: 0.93 mg/dL (ref 0.61–1.24)
Chloride: 107 mmol/L (ref 98–111)
GFR calc Af Amer: >60 mL/min (ref >60)
GFR calc non Af Amer: >60 mL/min (ref >60)
GLUCOSE: 100 mg/dL — AB (ref 70–99)
Potassium: 3.8 mmol/L (ref 3.5–5.1)
Sodium: 139 mmol/L (ref 135–145)

## 2017-11-15 LAB — HEPARIN LEVEL (UNFRACTIONATED): Heparin Unfractionated: 0.3 IU/mL (ref 0.30–0.70)

## 2017-11-15 MED ORDER — WARFARIN - PHARMACIST DOSING INPATIENT: Freq: Every day | Status: DC

## 2017-11-15 MED ORDER — WARFARIN SODIUM 6 MG PO TABS
6.0000 mg | ORAL_TABLET | Freq: Once | ORAL | Status: AC
  Administered 2017-11-15: 6 mg via ORAL
  Filled 2017-11-15: qty 1

## 2017-11-15 NOTE — Progress Notes (Signed)
[facility] PROGRESS NOTE Subjective Patient back on heparin infusion no gross bleeding hemoglobin stable  Objective: Vital signs in last 24 hours: Temp:  [97.4 F (36.3 C)-98.4 F (36.9 C)] 97.9 F (36.6 C) (10/02 0745) Pulse Rate:  [80-99] 80 (10/02 0700) Resp:  [15-24] 18 (10/02 0700) BP: (82-127)/(46-89) 101/58 (10/02 0700) SpO2:  [94 %-99 %] 98 % (10/02 0700) Weight:  [89.1 kg] 89.1 kg (10/02 0500) Last BM Date: 11/12/17  Intake/Output from previous day: 10/01 0701 - 10/02 0700 In: 655.9 [P.O.:360; I.V.:195.9; IV Piggyback:100] Out: 650 [Urine:650] Intake/Output this shift: No intake/output data recorded.  PE: General--no distress  Abdomen--nontender  Lab Results: Recent Labs    11/12/17 2133 11/13/17 0356 11/14/17 0439 11/15/17 0345  WBC  --  8.9 6.3 5.8  HGB 9.9* 9.5* 9.0* 8.9*  HCT 31.3* 29.9* 28.7* 28.4*  PLT  --  215 206 217   BMET Recent Labs    11/14/17 0439  NA 141  K 3.5  CL 108  CO2 24  CREATININE 1.15   LFT No results for input(s): PROT, AST, ALT, ALKPHOS, BILITOT, BILIDIR, IBILI in the last 72 hours. PT/INR No results for input(s): LABPROT, INR in the last 72 hours. PANCREAS No results for input(s): LIPASE in the last 72 hours.       Studies/Results: No results found.  Medications: I have reviewed the patient's current medications.  Assessment:   1.  GI bleeding.  Probably due to ulcerations in both the stomach and duodenum   Plan: Would watch at least 24 hours on heparin drip and if no signs of GI bleeding will go ahead and slowly resume Coumadin. We will check an iron panel in the a.m. and if low would give him a dose of IV iron.   Nancy Fetter 11/15/2017, 9:16 AM  This note was created using voice recognition software. Minor errors may Have occurred unintentionally.  Pager: [phone] If no answer or after hours call [phone]

## 2017-11-15 NOTE — Progress Notes (Signed)
ANTICOAGULATION CONSULT NOTE  Pharmacy Consult:  Coumadin Indication: Mechanical AVR + Afib  No Known Allergies  Patient Measurements: Height: 5\' 8"  (172.7 cm) Weight: 196 lb 6.9 oz (89.1 kg) IBW/kg (Calculated) : 68.4  Vital Signs: Temp: 97.8 F (36.6 C) (10/02 1559) Temp Source: Oral (10/02 1559) BP: 112/92 (10/02 1500) Pulse Rate: 90 (10/02 1500)  Labs: Recent Labs    11/13/17 0356 11/14/17 0439 11/14/17 2006 11/15/17 0345  HGB 9.5* 9.0*  --  8.9*  HCT 29.9* 28.7*  --  28.4*  PLT 215 206  --  217  HEPARINUNFRC  --   --  0.24* 0.30  CREATININE  --  1.15  --  0.93    Estimated Creatinine Clearance: 72.2 mL/min (by C-G formula based on SCr of 0.93 mg/dL).    Assessment: 77 YOF with history of mechanical AVR and AFib on Coumadin 5mg  PO daily PTA.  Patient had a lower GIB, then deemed stable enough for resumption of IV heparin on 11/14/17.  Pharmacy now consulted to resume Coumadin.  Patient received FFP/Vitamin K earlier in the hospitalization.  Expect slow rise in INR, especially with conservative dosing to avoid bleeding.    Goal of Therapy:  Heparin level 0.3 - 0.5 units/ml Monitor platelets by anticoagulation protocol: Yes    Plan:  Coumadin 6mg  PO today Daily PT /  INR Monitor closely for s/sx of bleeding   Lecia Esperanza D. Mina Marble, PharmD, BCPS, [place] 11/15/2017, 4:55 PM

## 2017-11-15 NOTE — Progress Notes (Addendum)
PROGRESS NOTE        PATIENT DETAILS Name: Timothy SEALS Sr. Age: 77 y.o. Sex: male Date of Birth: 1940-12-09 Admit Date: 11/05/2017 Admitting Physician Desiree Hane, MD HBZ:JIRC, Weldon Picking, MD  Brief Narrative:  Patient is a 77 years old male with past medical history of atrial fibrillation, mechanical aortic valve on warfarin, nonischemic cardiomyopathy status post AICD presented to the hospital with complaints of melena.  EGD and colonoscopy were performed on 11/08/2017.  EGD showed 3 large nonbleeding ulcers with discoloration at the bases.  Patient also underwent embolization of the gastroduodenal artery on 11/11/2017.  Colonoscopy showed 2 polyps that were removed.  Patient has received a total of 5 units of PRBC.  GI, cardiology following- anticoagulation restarted with heparin drip on 11/14/2017 with close follow-up.  Subjective: Patient denies any interval complaints.  Denies any chest pain, palpitation or shortness of breath.  Has not had a bowel movement since yesterday.  Assessment/Plan:  Upper GI bleeding with acute blood loss anemia: Likely secondary to nonbleeding ulcers on the background of anticoagulation with Coumadin.  Patient underwent embolization of the gastroduodenal artery.  Continue on IV proton pump inhibitors every 12.  Patient is tolerating IV heparin with no downtrending hemoglobin or active GI bleed.  Seen by GI this morning.  Continue heparin drip for at least 24 hours.  Continue to monitor closely for bleeding.  If no further bleeding, plan is to  resume Coumadin.  Iron panel has been sent.  Atrial fibrillation: Rate controlled, continue metoprolol.  Continue heparin drip for now.  Mechanical aortic valve replacement: Continue on heparin drip for now.  We will closely monitor for bleeding.  If no bleeding will consider starting Coumadin.  Chronic combined systolic/diastolic heart failure (EF 40-45% by TTE on 11/06/2017): Patient  appears compensated at this time.  Diuretics on hold.  Hypertension: Blood pressure has started to improve still marginally low.  On metoprolol.  We will closely monitor.  DVT Prophylaxis: SCD, Heparin drip   Code Status: Full code  Family Communication: No one present at bedside  Disposition Plan:  Home once stable.  We will transfer the patient to telemetry today.  We will ambulate the patient.  Antimicrobial agents: Anti-infectives (From admission, onward)   Start     Dose/Rate Route Frequency Ordered Stop   11/10/17 2200  cefTRIAXone (ROCEPHIN) 1 g in sodium chloride 0.9 % 100 mL IVPB     1 g 200 mL/hr over 30 Minutes Intravenous Every 24 hours 11/10/17 2146 11/14/17 2143      Procedures: 9/27>> mesenteric angiogram embolization of GDA  9/25>> EGD/colonoscopy  9/23>> echocardiogram: - Left ventricle: The cavity size was normal. Systolic function was   mildly to moderately reduced. The estimated ejection fraction was   in the range of 40% to 45%. - Aortic valve: Mildly calcified annulus. Moderately thickened,   moderately calcified leaflets. There was moderate stenosis. Valve   area (VTI): 1.59 cm^2. Valve area (Vmax): 1.61 cm^2. Valve area   (Vmean): 1.34 cm^2. - Mitral valve: There was mild regurgitation. - Left atrium: The atrium was moderately dilated. - Right atrium: The atrium was mildly dilated. - Pulmonary arteries: Systolic pressure was mildly increased. PA   peak pressure: 34 mm Hg (S).  CONSULTS:  cardiology and GI  Time spent: 27- minutes-Greater than 50% of this time was spent  in counseling, explanation of diagnosis, planning of further management, and coordination of care.  MEDICATIONS: Scheduled Meds: . atorvastatin  10 mg Oral q1800  . cholecalciferol  1,000 Units Oral Daily  . ferrous sulfate  325 mg Oral QODAY  . metoprolol succinate  100 mg Oral BID  . omega-3 acid ethyl esters  1,000 mg Oral Daily  . pantoprazole  40 mg Intravenous Q12H   . sucralfate  1 g Oral TID WC & HS  . tamsulosin  0.4 mg Oral QHS  . vitamin C  500 mg Oral Daily   Continuous Infusions: . sodium chloride Stopped (11/12/17 2156)  . heparin 1,250 Units/hr (11/15/17 0700)   PRN Meds:.sodium chloride, acetaminophen, meclizine, senna-docusate, traMADol   PHYSICAL EXAM: Vital signs: Vitals:   11/15/17 0500 11/15/17 0600 11/15/17 0700 11/15/17 0745  BP: 107/61 (!) 82/46 (!) 101/58   Pulse: 81 81 80   Resp: 16 18 18    Temp:    97.9 F (36.6 C)  TempSrc:    Oral  SpO2: 94% 96% 98%   Weight: 89.1 kg     Height:       Filed Weights   11/13/17 0329 11/14/17 0500 11/15/17 0500  Weight: 89.9 kg 88.1 kg 89.1 kg   Body mass index is 29.87 kg/m.   Examination: General exam: Appears calm and comfortable ,Not in distress,average built HEENT:PERRL, mild pallor noted.  Oral mucosa moist, Ear/Nose normal on gross exam Respiratory system: Bilateral equal air entry, normal vesicular breath sounds, no wheezes or crackles  Cardiovascular system: S1 & S2 heard, mechanical click noted, irregular rhythm,no pedal edema. Gastrointestinal system: Abdomen is nondistended, soft and nontender. No organomegaly or masses felt. Normal bowel sounds heard. Central nervous system: Alert and oriented. No focal neurological deficits. Extremities: No edema, no clubbing ,no cyanosis, distal peripheral pulses palpable. Skin: No rashes, lesions or ulcers,no icterus ,no pallor MSK: Normal muscle bulk,tone ,power Psychiatry: Judgement and insight appear normal. Mood & affect appropriate.    I have personally reviewed following labs and imaging studies  LABORATORY DATA: CBC: Recent Labs  Lab 11/11/17 0337  11/12/17 0500 11/12/17 2133 11/13/17 0356 11/14/17 0439 11/15/17 0345  WBC 12.6*  --  10.7*  --  8.9 6.3 5.8  HGB 8.1*   < > 7.5* 9.9* 9.5* 9.0* 8.9*  HCT 25.1*   < > 23.4* 31.3* 29.9* 28.7* 28.4*  MCV 91.3  --  94.4  --  91.7 92.9 93.4  PLT 229  --  222  --  215  206 217   < > = values in this interval not displayed.    Basic Metabolic Panel: Recent Labs  Lab 11/09/17 0343 11/10/17 0435 11/11/17 0337 11/12/17 0500 11/14/17 0439  NA 138 138 136 135 141  K 4.0 4.4 4.0 3.9 3.5  CL 109 109 110 107 108  CO2 24 24 20* 21* 24  GLUCOSE 119* 113* 105* 96 118*  BUN 12 19 18 14 14   CREATININE 1.03 1.04 0.97 0.88 1.15  CALCIUM 8.2* 7.8* 7.5* 7.6* 7.6*    GFR: Estimated Creatinine Clearance: 58.4 mL/min (by C-G formula based on SCr of 1.15 mg/dL).  Liver Function Tests: Recent Labs  Lab 11/12/17 0500  AST 56*  ALT 46*  ALKPHOS 53  BILITOT 0.7  PROT 4.6*  ALBUMIN 2.5*   No results for input(s): LIPASE, AMYLASE in the last 168 hours. No results for input(s): AMMONIA in the last 168 hours.  Coagulation Profile: Recent Labs  Lab 11/09/17  1610 11/10/17 0435 11/10/17 1752 11/11/17 0337 11/12/17 0500  INR 1.29 1.59 2.00 2.16 1.19    Cardiac Enzymes: No results for input(s): CKTOTAL, CKMB, CKMBINDEX, TROPONINI in the last 168 hours.  BNP (last 3 results) No results for input(s): PROBNP in the last 8760 hours.  HbA1C: No results for input(s): HGBA1C in the last 72 hours.  CBG: Recent Labs  Lab 11/10/17 2048  GLUCAP 111*    Lipid Profile: No results for input(s): CHOL, HDL, LDLCALC, TRIG, CHOLHDL, LDLDIRECT in the last 72 hours.  Thyroid Function Tests: No results for input(s): TSH, T4TOTAL, FREET4, T3FREE, THYROIDAB in the last 72 hours.  Anemia Panel: No results for input(s): VITAMINB12, FOLATE, FERRITIN, TIBC, IRON, RETICCTPCT in the last 72 hours.  Urine analysis:    Component Value Date/Time   COLORURINE AMBER (A) 04/17/2012 1751   APPEARANCEUR CLOUDY (A) 04/17/2012 1751   LABSPEC 1.027 04/17/2012 1751   PHURINE 6.0 04/17/2012 1751   GLUCOSEU NEGATIVE 04/17/2012 1751   HGBUR NEGATIVE 04/17/2012 1751   BILIRUBINUR SMALL (A) 04/17/2012 1751   KETONESUR 15 (A) 04/17/2012 1751   PROTEINUR 30 (A) 04/17/2012 1751     UROBILINOGEN 1.0 04/17/2012 1751   NITRITE NEGATIVE 04/17/2012 1751   LEUKOCYTESUR LARGE (A) 04/17/2012 1751    Sepsis Labs: Lactic Acid, Venous    Component Value Date/Time   LATICACIDVEN 1.2 04/17/2012 1709    MICROBIOLOGY: Recent Results (from the past 240 hour(s))  MRSA PCR Screening     Status: None   Collection Time: 11/05/17  2:08 PM  Result Value Ref Range Status   MRSA by PCR NEGATIVE NEGATIVE Final    Comment:        The GeneXpert MRSA Assay (FDA approved for NASAL specimens only), is one component of a comprehensive MRSA colonization surveillance program. It is not intended to diagnose MRSA infection nor to guide or monitor treatment for MRSA infections. Performed at [facility], [place] [address] [place], [place] [address]   Culture, Urine     Status: None   Collection Time: 11/10/17  9:59 PM  Result Value Ref Range Status   Specimen Description URINE, CATHETERIZED  Final   Special Requests NONE  Final   Culture   Final    NO GROWTH Performed at [facility], [address], [place], [place] [address]    Report Status 11/12/2017 FINAL  Final  Culture, blood (routine x 2)     Status: None (Preliminary result)   Collection Time: 11/10/17 11:12 PM  Result Value Ref Range Status   Specimen Description BLOOD RIGHT ANTECUBITAL  Final   Special Requests   Final    BOTTLES DRAWN AEROBIC AND ANAEROBIC Blood Culture adequate volume   Culture   Final    NO GROWTH 4 DAYS Performed at [facility], [place] [address] [place], [place] [address]    Report Status PENDING  Incomplete  Culture, blood (routine x 2)     Status: None (Preliminary result)   Collection Time: 11/10/17 11:19 PM  Result Value Ref Range Status   Specimen Description BLOOD RIGHT HAND  Final   Special Requests   Final    BOTTLES DRAWN AEROBIC AND ANAEROBIC Blood Culture adequate volume   Culture   Final    NO GROWTH 4 DAYS Performed at [facility], [place] [address] [place], [place] [address]    Report Status PENDING  Incomplete    RADIOLOGY STUDIES/RESULTS: Nm Gi Blood  Loss  Result Date: 11/10/2017 CLINICAL DATA:  Melena/hematochezia this morning. Recent endoscopy demonstrated antral ulcers and 3 large duodenal bulbs ulcers. Patient is on anticoagulation for mechanical valve and atrial fibrillation. EXAM: NUCLEAR MEDICINE GASTROINTESTINAL BLEEDING SCAN TECHNIQUE: Sequential abdominal images were obtained following intravenous administration of Tc-79m labeled red blood cells. RADIOPHARMACEUTICALS:  26.3 mCi Tc-44m pertechnetate in-vitro labeled red cells. COMPARISON:  None. FINDINGS: There is a small focus abnormal radiotracer activity which appears to which to originate within the proximal small bowel. The overall amount of abnormal radiotracer activity is very small suggesting a small, transient bleed. Physiologic activity is seen within the heart, liver, urinary bladder, abdominal aorta and pelvic vasculature. IMPRESSION: Suspected transient proximal small bowel bleed. Critical Value/emergent results were called by telephone at the time of interpretation on 11/10/2017 at 1636 to Dr. Ronnette Juniper , who verbally acknowledged these results. Electronically Signed   By: Sandi Mariscal M.D.   On: 11/10/2017 16:35   Ir Angiogram Visceral Selective  Result Date: 11/11/2017 INDICATION: Gastrointestinal bleeding EXAM: MESENTERIC ANGIOGRAM AND GASTRODUODENAL ARTERY EMBOLIZATION. MEDICATIONS: NONE ANESTHESIA/SEDATION: Moderate (conscious) sedation was employed during this procedure. A total of Versed 1 mg and Fentanyl 50 mcg was administered intravenously. Moderate Sedation Time: 76 minutes. The patient's level of consciousness and vital signs were monitored continuously by radiology nursing throughout the procedure under my direct supervision. CONTRAST:  170 CC ISOVUE-300 FLUOROSCOPY TIME:  Fluoroscopy Time: 13 minutes 30 seconds (815 mGy). COMPLICATIONS: None immediate.  PROCEDURE: Informed consent was obtained from the patient following explanation of the procedure, risks, benefits and alternatives. The patient understands, agrees and consents for the procedure. All questions were addressed. A time out was performed prior to the initiation of the procedure. Maximal barrier sterile technique utilized including caps, mask, sterile gowns, sterile gloves, large sterile drape, hand hygiene, and Betadine prep. The right groin was prepped and draped in a sterile fashion. 1% lidocaine was utilized for local anesthesia. Under sonographic guidance, a micropuncture needle was inserted into the right common femoral artery and removed over a 018 wire. This was up sized to a Bentson. The common femoral artery was noted to be patent. Sonographic documentation was obtained A 5 French sheath was inserted. A 5 French pigtail catheter was advanced into the aorta. Lateral abdominal aortography was performed for the origins of the celiac and SMA. The catheter was retracted. RA 0 distal abdominal aortography was performed for the origin of the IMA. The pigtail was exchanged for a cobra 2 catheter. The celiac was selected. Angiography was performed. This was advanced over a glidewire into the common hepatic artery. Angiography was performed. A Renegade microcatheter was advanced over a 016 fathom wire into the gastroduodenal artery and angiography was performed. The catheter was then advanced over the wire into the distal gastroduodenal artery. Embolization was performed utilizing 3 mm and 4 mm diameter interlock coils for total embolization of the gastroduodenal artery. The microcatheter was retracted into the common hepatic artery and repeat angiography was performed. The Cobra 2 catheter was aspirated and flushed. It was retracted into the abdominal aorta. It was advanced over a Bentson wire into the SMA and angiography was performed. It was then retracted and advanced over a Bentson wire into the IMA  and angiography was performed. The catheter was then advanced into the right external iliac artery and right RA 0 femoral angiography was performed. The sheath was flushed and sewn in place.  The patient's INR is 2.1. FINDINGS: Lateral abdominal aortography demonstrates patency of  the celiac and SMA. RA 0 distal abdominal aortography demonstrates patency of the origin of the IMA. Celiac angiography delineates anatomy. There is no evidence of active bleeding within the territory of the gastroduodenal artery or left gastric artery Selective angiography of the common hepatic artery demonstrates patency of the gastroduodenal artery. Subsequent images demonstrate coil embolization of the gastroduodenal artery. 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 Injection of the SMA demonstrates patency of the branches but no source of active bleeding. Angiography of the IMA over the pelvis and left upper quadrant demonstrates no evidence of bleeding from the descending or sigmoid colon. IMPRESSION: Angiography of the celiac, SMA, and IMA demonstrates no active source of bleeding. Empiric embolization of the gastroduodenal artery was performed. The right femoral artery sheath was sewn in place in preparation for sheath removal after the INR is normalized. Electronically Signed   By: Marybelle Killings M.D.   On: 11/11/2017 14:28   Ir Angiogram Visceral Selective  Result Date: 11/11/2017 INDICATION: Gastrointestinal bleeding EXAM: MESENTERIC ANGIOGRAM AND GASTRODUODENAL ARTERY EMBOLIZATION. MEDICATIONS: NONE ANESTHESIA/SEDATION: Moderate (conscious) sedation was employed during this procedure. A total of Versed 1 mg and Fentanyl 50 mcg was administered intravenously. Moderate Sedation Time: 76 minutes. The patient's level of consciousness and vital signs were monitored continuously by radiology nursing throughout the procedure under my direct supervision. CONTRAST:  170 CC  ISOVUE-300 FLUOROSCOPY TIME:  Fluoroscopy Time: 13 minutes 30 seconds (815 mGy). COMPLICATIONS: None immediate. PROCEDURE: Informed consent was obtained from the patient following explanation of the procedure, risks, benefits and alternatives. The patient understands, agrees and consents for the procedure. All questions were addressed. A time out was performed prior to the initiation of the procedure. Maximal barrier sterile technique utilized including caps, mask, sterile gowns, sterile gloves, large sterile drape, hand hygiene, and Betadine prep. The right groin was prepped and draped in a sterile fashion. 1% lidocaine was utilized for local anesthesia. Under sonographic guidance, a micropuncture needle was inserted into the right common femoral artery and removed over a 018 wire. This was up sized to a Bentson. The common femoral artery was noted to be patent. Sonographic documentation was obtained A 5 French sheath was inserted. A 5 French pigtail catheter was advanced into the aorta. Lateral abdominal aortography was performed for the origins of the celiac and SMA. The catheter was retracted. RA 0 distal abdominal aortography was performed for the origin of the IMA. The pigtail was exchanged for a cobra 2 catheter. The celiac was selected. Angiography was performed. This was advanced over a glidewire into the common hepatic artery. Angiography was performed. A Renegade microcatheter was advanced over a 016 fathom wire into the gastroduodenal artery and angiography was performed. The catheter was then advanced over the wire into the distal gastroduodenal artery. Embolization was performed utilizing 3 mm and 4 mm diameter interlock coils for total embolization of the gastroduodenal artery. The microcatheter was retracted into the common hepatic artery and repeat angiography was performed. The Cobra 2 catheter was aspirated and flushed. It was retracted into the abdominal aorta. It was advanced over a Bentson wire  into the SMA and angiography was performed. It was then retracted and advanced over a Bentson wire into the IMA and angiography was performed. The catheter was then advanced into the right external iliac artery and right RA 0 femoral angiography was performed. The sheath was flushed and sewn in place.  The patient's INR is 2.1. FINDINGS: Lateral abdominal aortography  demonstrates patency of the celiac and SMA. RA 0 distal abdominal aortography demonstrates patency of the origin of the IMA. Celiac angiography delineates anatomy. There is no evidence of active bleeding within the territory of the gastroduodenal artery or left gastric artery Selective angiography of the common hepatic artery demonstrates patency of the gastroduodenal artery. Subsequent images demonstrate coil embolization of the gastroduodenal artery. 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 Injection of the SMA demonstrates patency of the branches but no source of active bleeding. Angiography of the IMA over the pelvis and left upper quadrant demonstrates no evidence of bleeding from the descending or sigmoid colon. IMPRESSION: Angiography of the celiac, SMA, and IMA demonstrates no active source of bleeding. Empiric embolization of the gastroduodenal artery was performed. The right femoral artery sheath was sewn in place in preparation for sheath removal after the INR is normalized. Electronically Signed   By: Marybelle Killings M.D.   On: 11/11/2017 14:28   Ir Angiogram Visceral Selective  Result Date: 11/11/2017 INDICATION: Gastrointestinal bleeding EXAM: MESENTERIC ANGIOGRAM AND GASTRODUODENAL ARTERY EMBOLIZATION. MEDICATIONS: NONE ANESTHESIA/SEDATION: Moderate (conscious) sedation was employed during this procedure. A total of Versed 1 mg and Fentanyl 50 mcg was administered intravenously. Moderate Sedation Time: 76 minutes. The patient's level of consciousness and vital signs were  monitored continuously by radiology nursing throughout the procedure under my direct supervision. CONTRAST:  170 CC ISOVUE-300 FLUOROSCOPY TIME:  Fluoroscopy Time: 13 minutes 30 seconds (815 mGy). COMPLICATIONS: None immediate. PROCEDURE: Informed consent was obtained from the patient following explanation of the procedure, risks, benefits and alternatives. The patient understands, agrees and consents for the procedure. All questions were addressed. A time out was performed prior to the initiation of the procedure. Maximal barrier sterile technique utilized including caps, mask, sterile gowns, sterile gloves, large sterile drape, hand hygiene, and Betadine prep. The right groin was prepped and draped in a sterile fashion. 1% lidocaine was utilized for local anesthesia. Under sonographic guidance, a micropuncture needle was inserted into the right common femoral artery and removed over a 018 wire. This was up sized to a Bentson. The common femoral artery was noted to be patent. Sonographic documentation was obtained A 5 French sheath was inserted. A 5 French pigtail catheter was advanced into the aorta. Lateral abdominal aortography was performed for the origins of the celiac and SMA. The catheter was retracted. RA 0 distal abdominal aortography was performed for the origin of the IMA. The pigtail was exchanged for a cobra 2 catheter. The celiac was selected. Angiography was performed. This was advanced over a glidewire into the common hepatic artery. Angiography was performed. A Renegade microcatheter was advanced over a 016 fathom wire into the gastroduodenal artery and angiography was performed. The catheter was then advanced over the wire into the distal gastroduodenal artery. Embolization was performed utilizing 3 mm and 4 mm diameter interlock coils for total embolization of the gastroduodenal artery. The microcatheter was retracted into the common hepatic artery and repeat angiography was performed. The Cobra  2 catheter was aspirated and flushed. It was retracted into the abdominal aorta. It was advanced over a Bentson wire into the SMA and angiography was performed. It was then retracted and advanced over a Bentson wire into the IMA and angiography was performed. The catheter was then advanced into the right external iliac artery and right RA 0 femoral angiography was performed. The sheath was flushed and sewn in place.  The patient's INR is 2.1. FINDINGS:  Lateral abdominal aortography demonstrates patency of the celiac and SMA. RA 0 distal abdominal aortography demonstrates patency of the origin of the IMA. Celiac angiography delineates anatomy. There is no evidence of active bleeding within the territory of the gastroduodenal artery or left gastric artery Selective angiography of the common hepatic artery demonstrates patency of the gastroduodenal artery. Subsequent images demonstrate coil embolization of the gastroduodenal artery. 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 Injection of the SMA demonstrates patency of the branches but no source of active bleeding. Angiography of the IMA over the pelvis and left upper quadrant demonstrates no evidence of bleeding from the descending or sigmoid colon. IMPRESSION: Angiography of the celiac, SMA, and IMA demonstrates no active source of bleeding. Empiric embolization of the gastroduodenal artery was performed. The right femoral artery sheath was sewn in place in preparation for sheath removal after the INR is normalized. Electronically Signed   By: Marybelle Killings M.D.   On: 11/11/2017 14:28   Ir Angiogram Selective Each Additional Vessel  Result Date: 11/11/2017 INDICATION: Gastrointestinal bleeding EXAM: MESENTERIC ANGIOGRAM AND GASTRODUODENAL ARTERY EMBOLIZATION. MEDICATIONS: NONE ANESTHESIA/SEDATION: Moderate (conscious) sedation was employed during this procedure. A total of Versed 1 mg and Fentanyl 50 mcg  was administered intravenously. Moderate Sedation Time: 76 minutes. The patient's level of consciousness and vital signs were monitored continuously by radiology nursing throughout the procedure under my direct supervision. CONTRAST:  170 CC ISOVUE-300 FLUOROSCOPY TIME:  Fluoroscopy Time: 13 minutes 30 seconds (815 mGy). COMPLICATIONS: None immediate. PROCEDURE: Informed consent was obtained from the patient following explanation of the procedure, risks, benefits and alternatives. The patient understands, agrees and consents for the procedure. All questions were addressed. A time out was performed prior to the initiation of the procedure. Maximal barrier sterile technique utilized including caps, mask, sterile gowns, sterile gloves, large sterile drape, hand hygiene, and Betadine prep. The right groin was prepped and draped in a sterile fashion. 1% lidocaine was utilized for local anesthesia. Under sonographic guidance, a micropuncture needle was inserted into the right common femoral artery and removed over a 018 wire. This was up sized to a Bentson. The common femoral artery was noted to be patent. Sonographic documentation was obtained A 5 French sheath was inserted. A 5 French pigtail catheter was advanced into the aorta. Lateral abdominal aortography was performed for the origins of the celiac and SMA. The catheter was retracted. RA 0 distal abdominal aortography was performed for the origin of the IMA. The pigtail was exchanged for a cobra 2 catheter. The celiac was selected. Angiography was performed. This was advanced over a glidewire into the common hepatic artery. Angiography was performed. A Renegade microcatheter was advanced over a 016 fathom wire into the gastroduodenal artery and angiography was performed. The catheter was then advanced over the wire into the distal gastroduodenal artery. Embolization was performed utilizing 3 mm and 4 mm diameter interlock coils for total embolization of the  gastroduodenal artery. The microcatheter was retracted into the common hepatic artery and repeat angiography was performed. The Cobra 2 catheter was aspirated and flushed. It was retracted into the abdominal aorta. It was advanced over a Bentson wire into the SMA and angiography was performed. It was then retracted and advanced over a Bentson wire into the IMA and angiography was performed. The catheter was then advanced into the right external iliac artery and right RA 0 femoral angiography was performed. The sheath was flushed and sewn in place.  The  patient's INR is 2.1. FINDINGS: Lateral abdominal aortography demonstrates patency of the celiac and SMA. RA 0 distal abdominal aortography demonstrates patency of the origin of the IMA. Celiac angiography delineates anatomy. There is no evidence of active bleeding within the territory of the gastroduodenal artery or left gastric artery Selective angiography of the common hepatic artery demonstrates patency of the gastroduodenal artery. Subsequent images demonstrate coil embolization of the gastroduodenal artery. 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 Injection of the SMA demonstrates patency of the branches but no source of active bleeding. Angiography of the IMA over the pelvis and left upper quadrant demonstrates no evidence of bleeding from the descending or sigmoid colon. IMPRESSION: Angiography of the celiac, SMA, and IMA demonstrates no active source of bleeding. Empiric embolization of the gastroduodenal artery was performed. The right femoral artery sheath was sewn in place in preparation for sheath removal after the INR is normalized. Electronically Signed   By: Marybelle Killings M.D.   On: 11/11/2017 14:28   Ir US Guide Vasc Access Right  Result Date: 11/11/2017 INDICATION: Gastrointestinal bleeding EXAM: MESENTERIC ANGIOGRAM AND GASTRODUODENAL ARTERY EMBOLIZATION. MEDICATIONS: NONE  ANESTHESIA/SEDATION: Moderate (conscious) sedation was employed during this procedure. A total of Versed 1 mg and Fentanyl 50 mcg was administered intravenously. Moderate Sedation Time: 76 minutes. The patient's level of consciousness and vital signs were monitored continuously by radiology nursing throughout the procedure under my direct supervision. CONTRAST:  170 CC ISOVUE-300 FLUOROSCOPY TIME:  Fluoroscopy Time: 13 minutes 30 seconds (815 mGy). COMPLICATIONS: None immediate. PROCEDURE: Informed consent was obtained from the patient following explanation of the procedure, risks, benefits and alternatives. The patient understands, agrees and consents for the procedure. All questions were addressed. A time out was performed prior to the initiation of the procedure. Maximal barrier sterile technique utilized including caps, mask, sterile gowns, sterile gloves, large sterile drape, hand hygiene, and Betadine prep. The right groin was prepped and draped in a sterile fashion. 1% lidocaine was utilized for local anesthesia. Under sonographic guidance, a micropuncture needle was inserted into the right common femoral artery and removed over a 018 wire. This was up sized to a Bentson. The common femoral artery was noted to be patent. Sonographic documentation was obtained A 5 French sheath was inserted. A 5 French pigtail catheter was advanced into the aorta. Lateral abdominal aortography was performed for the origins of the celiac and SMA. The catheter was retracted. RA 0 distal abdominal aortography was performed for the origin of the IMA. The pigtail was exchanged for a cobra 2 catheter. The celiac was selected. Angiography was performed. This was advanced over a glidewire into the common hepatic artery. Angiography was performed. A Renegade microcatheter was advanced over a 016 fathom wire into the gastroduodenal artery and angiography was performed. The catheter was then advanced over the wire into the distal  gastroduodenal artery. Embolization was performed utilizing 3 mm and 4 mm diameter interlock coils for total embolization of the gastroduodenal artery. The microcatheter was retracted into the common hepatic artery and repeat angiography was performed. The Cobra 2 catheter was aspirated and flushed. It was retracted into the abdominal aorta. It was advanced over a Bentson wire into the SMA and angiography was performed. It was then retracted and advanced over a Bentson wire into the IMA and angiography was performed. The catheter was then advanced into the right external iliac artery and right RA 0 femoral angiography was performed. The sheath was flushed and  sewn in place.  The patient's INR is 2.1. FINDINGS: Lateral abdominal aortography demonstrates patency of the celiac and SMA. RA 0 distal abdominal aortography demonstrates patency of the origin of the IMA. Celiac angiography delineates anatomy. There is no evidence of active bleeding within the territory of the gastroduodenal artery or left gastric artery Selective angiography of the common hepatic artery demonstrates patency of the gastroduodenal artery. Subsequent images demonstrate coil embolization of the gastroduodenal artery. 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 Injection of the SMA demonstrates patency of the branches but no source of active bleeding. Angiography of the IMA over the pelvis and left upper quadrant demonstrates no evidence of bleeding from the descending or sigmoid colon. IMPRESSION: Angiography of the celiac, SMA, and IMA demonstrates no active source of bleeding. Empiric embolization of the gastroduodenal artery was performed. The right femoral artery sheath was sewn in place in preparation for sheath removal after the INR is normalized. Electronically Signed   By: Marybelle Killings M.D.   On: 11/11/2017 14:28   [place] Guide  Roadmapping  Result Date: 11/13/2017 INDICATION: Gastrointestinal bleeding  EXAM: MESENTERIC ANGIOGRAM AND GASTRODUODENAL ARTERY EMBOLIZATION.  MEDICATIONS: NONE  ANESTHESIA/SEDATION: Moderate (conscious) sedation was employed during this procedure. A total of Versed 1 mg and Fentanyl 50 mcg was administered intravenously.  Moderate Sedation Time: 76 minutes. The patient's level of consciousness and vital signs were monitored continuously by radiology nursing throughout the procedure under my direct supervision.  CONTRAST:  170 CC ISOVUE-300  FLUOROSCOPY TIME:  Fluoroscopy Time: 13 minutes 30 seconds (815 mGy).  COMPLICATIONS: None immediate.  PROCEDURE: Informed consent was obtained from the patient following explanation of the procedure, risks, benefits and alternatives. The patient understands, agrees and consents for the procedure. All questions were addressed. A time out was performed prior to the initiation of the procedure. Maximal barrier sterile technique utilized including caps, mask, sterile gowns, sterile gloves, large sterile drape, hand hygiene, and Betadine prep.  The right groin was prepped and draped in a sterile fashion. 1% lidocaine was utilized for local anesthesia. Under sonographic guidance, a micropuncture needle was inserted into the right common femoral artery and removed over a 018 wire. This was up sized to a Bentson. The common femoral artery was noted to be patent. Sonographic documentation was obtained  A 5 French sheath was inserted. A 5 French pigtail catheter was advanced into the aorta. Lateral abdominal aortography was performed for the origins of the celiac and SMA. The catheter was retracted. RA 0 distal abdominal aortography was performed for the origin of the IMA.  The pigtail was exchanged for a cobra 2 catheter. The celiac was selected. Angiography was performed. This was advanced over a glidewire into the common hepatic artery. Angiography was performed. A  Renegade microcatheter was advanced over a 016 fathom wire into the gastroduodenal artery and angiography was performed. The catheter was then advanced over the wire into the distal gastroduodenal artery. Embolization was performed utilizing 3 mm and 4 mm diameter interlock coils for total embolization of the gastroduodenal artery. The microcatheter was retracted into the common hepatic artery and repeat angiography was performed.  The Cobra 2 catheter was aspirated and flushed. It was retracted into the abdominal aorta. It was advanced over a Bentson wire into the SMA and angiography was performed.  It was then retracted and advanced over a Bentson wire into the IMA and angiography was performed.  The catheter was then advanced into the right external iliac artery and right RA 0 femoral angiography was performed.  The sheath was flushed and sewn in place.  The patient's INR is 2.1.  FINDINGS: Lateral abdominal aortography demonstrates patency of the celiac and SMA.  RA 0 distal abdominal aortography demonstrates patency of the origin of the IMA.  Celiac angiography delineates anatomy. There is no evidence of active bleeding within the territory of the gastroduodenal artery or left gastric artery  Selective angiography of the common hepatic artery demonstrates patency of the gastroduodenal artery.  Subsequent images demonstrate coil embolization of the gastroduodenal artery. 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  Injection of the SMA demonstrates patency of the branches but no source of active bleeding.  Angiography of the IMA over the pelvis and left upper quadrant demonstrates no evidence of bleeding from the descending or sigmoid colon.  IMPRESSION: Angiography of the celiac, SMA, and IMA demonstrates no active source of bleeding.  Empiric embolization of the gastroduodenal artery was performed.  The right femoral artery sheath was  sewn in place in preparation for sheath removal after the INR is normalized.   Electronically Signed   By: Marybelle Killings M.D.   On: 11/11/2017 14:28    LOS: 10 days    Flora Lipps, MD  [facility]  If 7PM-7AM, please contact night-coverage  Please page via [email or web address] [email or web address]-click on MD name and type text message  11/15/2017, 10:04 AM

## 2017-11-15 NOTE — Progress Notes (Signed)
Called report to charge RN on [address] - will transfer pt right after shift change per w/c .

## 2017-11-15 NOTE — Progress Notes (Signed)
Progress Note  Patient Name: Timothy VALBUENA Sr. Date of Encounter: 11/15/2017  Primary Cardiologist: Rozann Lesches, MD  Subjective   No bleeding. Stable on heparin for 24 hours without hemodynamic decline. Walked around unit without difficulty with nurse assistance.   Inpatient Medications    Scheduled Meds: . atorvastatin  10 mg Oral q1800  . cholecalciferol  1,000 Units Oral Daily  . ferrous sulfate  325 mg Oral QODAY  . metoprolol succinate  100 mg Oral BID  . omega-3 acid ethyl esters  1,000 mg Oral Daily  . pantoprazole  40 mg Intravenous Q12H  . sucralfate  1 g Oral TID WC & HS  . tamsulosin  0.4 mg Oral QHS  . vitamin C  500 mg Oral Daily  . Warfarin - Pharmacist Dosing Inpatient   Does not apply q1800   Continuous Infusions: . sodium chloride Stopped (11/12/17 2156)  . heparin 1,250 Units/hr (11/15/17 1900)   PRN Meds: sodium chloride, acetaminophen, meclizine, senna-docusate, traMADol   Vital Signs    Vitals:   11/15/17 1700 11/15/17 1800 11/15/17 1900 11/15/17 2005  BP: 107/62 106/70 106/66 114/66  Pulse: 80 80 89 (!) 107  Resp: 13 17 (!) 22 (!) 31  Temp:    98.2 F (36.8 C)  TempSrc:    Oral  SpO2: 98% 98% 100% 100%  Weight:    89.5 kg  Height:    5\' 8"  (1.727 m)    Intake/Output Summary (Last 24 hours) at 11/15/2017 2032 Last data filed at 11/15/2017 1900 Gross per 24 hour  Intake 1435.13 ml  Output 825 ml  Net 610.13 ml   Filed Weights   11/14/17 0500 11/15/17 0500 11/15/17 2005  Weight: 88.1 kg 89.1 kg 89.5 kg    Telemetry    Intermittent biventricular pacing with wide complexes possibly representing afib with aberrant conduction vs ventricular ectopy- Personally Reviewed  ECG    Intermittent biventricular pacing with wide complexes possibly representing afib with aberrant conduction vs ventricular ectopy - 11/13/17  Physical Exam   GEN: No acute distress, lying comfortably in bed.   Neck: No JVD Cardiac: irregular rhythm, rate  80s-90s. Crisp mechanical valve closure sound at S2. No murmurs.  Respiratory: Clear to auscultation bilaterally. GI: Soft, nontender, non-distended  MS: No edema; No deformity. SCDs in place Neuro:  Nonfocal  Psych: Normal affect   Labs    Chemistry Recent Labs  Lab 11/12/17 0500 11/14/17 0439 11/15/17 0345  NA 135 141 139  K 3.9 3.5 3.8  CL 107 108 107  CO2 21* 24 26  GLUCOSE 96 118* 100*  BUN 14 14 12   CREATININE 0.88 1.15 0.93  CALCIUM 7.6* 7.6* 8.1*  PROT 4.6*  --   --   ALBUMIN 2.5*  --   --   AST 56*  --   --   ALT 46*  --   --   ALKPHOS 53  --   --   BILITOT 0.7  --   --   GFRNONAA >60 60* >60  GFRAA >60 >60 >60  ANIONGAP 7 9 6      Hematology Recent Labs  Lab 11/13/17 0356 11/14/17 0439 11/15/17 0345  WBC 8.9 6.3 5.8  RBC 3.26* 3.09* 3.04*  HGB 9.5* 9.0* 8.9*  HCT 29.9* 28.7* 28.4*  MCV 91.7 92.9 93.4  MCH 29.1 29.1 29.3  MCHC 31.8 31.4 31.3  RDW 17.6* 17.5* 17.2*  PLT 215 206 217    Cardiac EnzymesNo results for input(s):  TROPONINI in the last 168 hours. No results for input(s): TROPIPOC in the last 168 hours.   BNPNo results for input(s): BNP, PROBNP in the last 168 hours.   DDimer No results for input(s): DDIMER in the last 168 hours.   Radiology    No results found.  Cardiac Studies   Echocardiogram: - Left ventricle: The cavity size was normal. Systolic function was   mildly to moderately reduced. The estimated ejection fraction was   in the range of 40% to 45%. - Aortic valve: Mildly calcified annulus. Moderately thickened,   moderately calcified leaflets. There was moderate stenosis. Valve   area (VTI): 1.59 cm^2. Valve area (Vmax): 1.61 cm^2. Valve area   (Vmean): 1.34 cm^2. - Mitral valve: There was mild regurgitation. - Left atrium: The atrium was moderately dilated. - Right atrium: The atrium was mildly dilated. - Pulmonary arteries: Systolic pressure was mildly increased. PA   peak pressure: 34 mm Hg (S).  Patient  Profile     77 y.o. malewith a hx of aortic aneurysm status post Bentall procedure in 2002 with mechanical AVR, coronary artery disease nonobstructive in 2002, hypertension, hyperlipidemia, nonischemic cardiomyopathy status post [employer] Jude CRT-D in 2014, and persistent atrial fibrillationwho was admitted after multipleinappropriate ICD shocks, due to rapid afib.  Patient found to have a GI bleed.  He was found to have duodenal ulcers, polyps and diverticulosis with GI evaluation.  Recurrent bleeding and nuclear scan showed bleeding from proximal small bowel versus duodenum.  Had empiric embolization of GDA. Last echocardiogram September 2019 showed ejection fraction 40 to 45%, mechanical aortic valve with mean gradient 15 mmHg, mild mitral regurgitation and biatrial enlargement.  Assessment & Plan    Principal Problem:   GI bleed Active Problems:   Essential hypertension, benign   Atrial fibrillation with rapid ventricular response (HCC) -status post cardioversion   S/P aortic valve replacement with metallic valve   Chronic combined systolic and diastolic heart failure (HCC)   ICD (implantable cardioverter-defibrillator) in place   Supratherapeutic INR   Acute blood loss anemia  Mr. Madani has received blood products and has had an appropriate rise in his hemoglobin,which has remained stable. His valve has a crisp closure sound, so at present it seems to be functioning well. I believe we can cautiously initiate warfarin for mechanical aortic valve anticoagulation. Goal INR of 3.0 given age of mechanical valve in the aortic position and additional risk factor for thrombosis of atrial fibrillation.  - Please check daily CBC and INR.  Cardiology will follow along.  For questions or updates, please contact [place] Please consult [email or web address] for contact info under      Signed, Elouise Munroe, MD  11/15/2017, 1:32 PM

## 2017-11-15 NOTE — Progress Notes (Signed)
ANTICOAGULATION CONSULT NOTE - Follow-Up Consult  Pharmacy Consult for heparin Indication: Mechanical AVR + Afib  No Known Allergies  Patient Measurements: Height: 5\' 8"  (172.7 cm) Weight: 194 lb 3.6 oz (88.1 kg) IBW/kg (Calculated) : 68.4 Heparin Dosing Weight: 86.4 kg  Vital Signs: Temp: 97.8 F (36.6 C) (10/02 0339) Temp Source: Oral (10/02 0339) BP: 100/57 (10/02 0400) Pulse Rate: 82 (10/02 0400)  Labs: Recent Labs    11/12/17 0500  11/13/17 0356 11/14/17 0439 11/14/17 2006 11/15/17 0345  HGB 7.5*   < > 9.5* 9.0*  --  8.9*  HCT 23.4*   < > 29.9* 28.7*  --  28.4*  PLT 222  --  215 206  --  217  LABPROT 15.0  --   --   --   --   --   INR 1.19  --   --   --   --   --   HEPARINUNFRC  --   --   --   --  0.24* 0.30  CREATININE 0.88  --   --  1.15  --   --    < > = values in this interval not displayed.    Estimated Creatinine Clearance: 58.1 mL/min (by C-G formula based on SCr of 1.15 mg/dL).   Medical History: Past Medical History:  Diagnosis Date  . AICD (automatic cardioverter/defibrillator) present 07/11/2012  . Aortic aneurysm, thoracic ([place])    Fusiform s/p Bentall procedure 2002  . Aortic valve, bicuspid    Mechanical AVR  . Basal cell carcinoma of face    "burned off" (11/06/2017)  . BPH (benign prostatic hypertrophy)   . CHF (congestive heart failure) ([place])    "once" (11/06/2017)  . Coronary atherosclerosis of native coronary artery    Nonobstructive at cardiac catherization 2002  . Essential hypertension, benign   . Left bundle branch block   . Mixed hyperlipidemia   . Nonischemic cardiomyopathy (HCC)    CRT-D  . Persistent atrial fibrillation ([place])   . Presence of permanent cardiac pacemaker     Medications:  Scheduled:  . atorvastatin  10 mg Oral q1800  . cholecalciferol  1,000 Units Oral Daily  . ferrous sulfate  325 mg Oral QODAY  . metoprolol succinate  100 mg Oral BID  . omega-3 acid ethyl esters  1,000 mg Oral Daily  . pantoprazole   40 mg Intravenous Q12H  . sucralfate  1 g Oral TID WC & HS  . tamsulosin  0.4 mg Oral QHS  . vitamin C  500 mg Oral Daily   Infusions:  . sodium chloride Stopped (11/12/17 2156)  . heparin 1,250 Units/hr (11/15/17 0400)    Assessment: 88 yoF admitted on 9/22 with ICD shocks and now confirmed lower GIB. The patient was on warfarin PTA for history of mechanical AVR + Afib.   The patient's heparin drip has been on hold since 9/27 for a GI bleed. The patient's hemoglobin has remained stable since 9/30, so GI and Cardiology have cleared the patient to be reinitated on a low dose heparin drip. No bleeding has been noted. Will closely follow along for signs/symptoms of bleeding.  Heparin level therapeutic at 0.3 units/ml No bleeding reported  Goal of Therapy:  Heparin level 0.3 - 0.5 units/ml Monitor platelets by anticoagulation protocol: Yes   Plan:  -Continue IV heparin at 1250 units/hr -Daily HL and CBC -Monitor S/Sx bleeding closely  Thanks for allowing pharmacy to be a part of this patient's care.  Zacarias Pontes  Lauralee Evener, PharmD Clinical Pharmacist  11/15/2017

## 2017-11-15 NOTE — Progress Notes (Signed)
ANTICOAGULATION CONSULT NOTE - Initial Consult  Pharmacy Consult for heparin Indication: Mechanical AVR + Afib  No Known Allergies  Patient Measurements: Height: 5\' 8"  (172.7 cm) Weight: 196 lb 6.9 oz (89.1 kg) IBW/kg (Calculated) : 68.4 Heparin Dosing Weight: 86.4 kg  Vital Signs: Temp: 97.7 F (36.5 C) (10/02 1147) Temp Source: Oral (10/02 1147) BP: 113/72 (10/02 1051) Pulse Rate: 98 (10/02 1051)  Labs: Recent Labs    11/13/17 0356 11/14/17 0439 11/14/17 2006 11/15/17 0345  HGB 9.5* 9.0*  --  8.9*  HCT 29.9* 28.7*  --  28.4*  PLT 215 206  --  217  HEPARINUNFRC  --   --  0.24* 0.30  CREATININE  --  1.15  --  0.93    Estimated Creatinine Clearance: 72.2 mL/min (by C-G formula based on SCr of 0.93 mg/dL).   Medical History: Past Medical History:  Diagnosis Date  . AICD (automatic cardioverter/defibrillator) present 07/11/2012  . Aortic aneurysm, thoracic ([place])    Fusiform s/p Bentall procedure 2002  . Aortic valve, bicuspid    Mechanical AVR  . Basal cell carcinoma of face    "burned off" (11/06/2017)  . BPH (benign prostatic hypertrophy)   . CHF (congestive heart failure) ([place])    "once" (11/06/2017)  . Coronary atherosclerosis of native coronary artery    Nonobstructive at cardiac catherization 2002  . Essential hypertension, benign   . Left bundle branch block   . Mixed hyperlipidemia   . Nonischemic cardiomyopathy (HCC)    CRT-D  . Persistent atrial fibrillation ([place])   . Presence of permanent cardiac pacemaker     Medications:  Scheduled:  . atorvastatin  10 mg Oral q1800  . cholecalciferol  1,000 Units Oral Daily  . ferrous sulfate  325 mg Oral QODAY  . metoprolol succinate  100 mg Oral BID  . omega-3 acid ethyl esters  1,000 mg Oral Daily  . pantoprazole  40 mg Intravenous Q12H  . sucralfate  1 g Oral TID WC & HS  . tamsulosin  0.4 mg Oral QHS  . vitamin C  500 mg Oral Daily   Infusions:  . sodium chloride Stopped (11/12/17 2156)  .  heparin 1,250 Units/hr (11/15/17 1057)    Assessment: 11 yoF admitted on 9/22 with ICD shocks and now confirmed lower GIB. The patient was on warfarin PTA for history of mechanical AVR + Afib.   The patient's heparin drip has been on hold since 9/27 for a GI bleed. The patient's hemoglobin has remained stable since 9/30, so GI and Cardiology have cleared the patient to be reinitated on a low dose heparin drip. No bleeding has been noted. Will closely follow along for signs/symptoms of bleeding.  Heparin level therapeutic at 0.3 units/ml. CBC remains stable and nurse confirmed patient has no signs or symptoms of bleeding at this time.   Goal of Therapy:  Heparin level 0.3 - 0.5 units/ml Monitor platelets by anticoagulation protocol: Yes   Plan:  Continue heparin infusion at 1250 units/hr Monitor daily heparin level, CBC, and signs/symptoms of bleeding. Follow up plans for transitioning patient back onto warfarin Continue to monitor H&H and platelets  Thank you for allowing pharmacy to be a part of this patient's care.  Leron Croak, PharmD PGY1 Pharmacy Resident Phone: [phone]  Please check AMION for all [place] phone numbers 11/15/2017,12:10 PM

## 2017-11-15 NOTE — Progress Notes (Signed)
Pt transferred to [facility] bed 10  per Nurse tech and RN per W/C with all personal belongings

## 2017-11-16 LAB — IRON AND TIBC
Iron: 38 ug/dL — ABNORMAL LOW (ref 45–182)
SATURATION RATIOS: 14 % — AB (ref 17.9–39.5)
TIBC: 273 ug/dL (ref 250–450)
UIBC: 235 ug/dL

## 2017-11-16 LAB — CBC
HCT: 30.6 % — ABNORMAL LOW (ref 39.0–52.0)
Hemoglobin: 9.4 g/dL — ABNORMAL LOW (ref 13.0–17.0)
MCH: 29.2 pg (ref 26.0–34.0)
MCHC: 30.7 g/dL (ref 30.0–36.0)
MCV: 95 fL (ref 78.0–100.0)
PLATELETS: 211 10*3/uL (ref 150–400)
RBC: 3.22 MIL/uL — ABNORMAL LOW (ref 4.22–5.81)
RDW: 16.8 % — ABNORMAL HIGH (ref 11.5–15.5)
WBC: 5.3 10*3/uL (ref 4.0–10.5)

## 2017-11-16 LAB — CULTURE, BLOOD (ROUTINE X 2)
CULTURE: NO GROWTH
CULTURE: NO GROWTH
Special Requests: ADEQUATE
Special Requests: ADEQUATE

## 2017-11-16 LAB — PROTIME-INR
INR: 1.12
Prothrombin Time: 14.3 seconds (ref 11.4–15.2)

## 2017-11-16 LAB — HEPARIN LEVEL (UNFRACTIONATED): HEPARIN UNFRACTIONATED: 0.39 [IU]/mL (ref 0.30–0.70)

## 2017-11-16 MED ORDER — WARFARIN SODIUM 5 MG PO TABS
6.0000 mg | ORAL_TABLET | Freq: Once | ORAL | Status: AC
  Administered 2017-11-16: 6 mg via ORAL
  Filled 2017-11-16: qty 1

## 2017-11-16 MED ORDER — PANTOPRAZOLE SODIUM 40 MG PO TBEC
40.0000 mg | DELAYED_RELEASE_TABLET | Freq: Two times a day (BID) | ORAL | Status: DC
  Administered 2017-11-16 – 2017-11-18 (×5): 40 mg via ORAL
  Filled 2017-11-16 (×4): qty 1

## 2017-11-16 NOTE — Progress Notes (Addendum)
PROGRESS NOTE  Timothy GARFINKEL Sr. SFK:[id number] DOB: 1941-01-10 DOA: 11/05/2017 PCP: [name], MD  HPI/Recap of past 24 hours: HPI from Dr [name] Patient is a 77 year old male with past medical history of atrial fibrillation, mechanical aortic valve on warfarin, nonischemic cardiomyopathy status post AICD presented to the hospital with complaints of melena.  EGD and colonoscopy were performed on 11/08/2017.  EGD showed 3 large nonbleeding ulcers with discoloration at the bases.  Patient also underwent embolization of the gastroduodenal artery on 11/11/2017.  Colonoscopy showed 2 polyps that were removed.  Patient has received a total of 5 units of PRBC.  GI, cardiology following- anticoagulation restarted with heparin drip on 11/14/2017 with close follow-up.   Today, pt denied any new complaints, SOB, denies any BRBPR or melena, no epigastric pain/abd pain, fever/chills.  Assessment/Plan: Principal Problem:   GI bleed Active Problems:   Essential hypertension, benign   Atrial fibrillation with rapid ventricular response (HCC) -status post cardioversion   S/P aortic valve replacement with metallic valve   Chronic combined systolic and diastolic heart failure (HCC)   ICD (implantable cardioverter-defibrillator) in place   Supratherapeutic INR   Acute blood loss anemia  Upper GI bleed with acute blood loss anemia Stable Likely due to duodenal ulcers in conjunction with warfarin Status post transfusion of 5U of PRBC this admission GI on board: Status post EGD which showed 3 large nonbleeding ulcers, colonoscopy status post removal of 2 polyps, and embolization of the gastroduodenal artery Cardiology on board for management of anticoagulation as patient has a mechanical aortic valve Currently on IV heparin, resumed coumadin Continue PPI Monitor closely  Paroxysmal A. Fib Rate controlled Continue metoprolol, heparin drip, resumed coumadin  Chronic combined systolic/diastolic  HF Appears euvolemic Echo done on 11/06/2017 showed EF of 40 to 45% Diuretics on hold, to GI bleed  Hypertension Stable Continue metoprolol  Mechanical aortic valve replacement Continue on heparin drip, resumed coumadin Goal INR 3,monitor closely     Code Status: Full  Family Communication: None at bedside  Disposition Plan: Likely home once stable   Consultants:  GI  Cardiology  Procedures:  Mesenteric angiogram embolization of gastroduodenal artery on 9/27  EGD/colonoscopy on 9/25  Antimicrobials:  None  DVT prophylaxis: IV heparin drip   Objective: Vitals:   11/15/17 1900 11/15/17 2005 11/15/17 2230 11/16/17 0500  BP: 106/66 114/66 130/69 (!) 100/9  Pulse: 89 (!) 107 82 82  Resp: (!) 22 (!) 31  18  Temp:  98.2 F (36.8 C)  98.4 F (36.9 C)  TempSrc:  Oral  Oral  SpO2: 100% 100%  100%  Weight:  89.5 kg  81.7 kg  Height:  5\' 8"  (1.727 m)      Intake/Output Summary (Last 24 hours) at 11/16/2017 1322 Last data filed at 11/16/2017 0941 Gross per 24 hour  Intake 1290.03 ml  Output 625 ml  Net 665.03 ml   Filed Weights   11/15/17 0500 11/15/17 2005 11/16/17 0500  Weight: 89.1 kg 89.5 kg 81.7 kg    Exam:   General: NAD  Cardiovascular: S1, S2 present, mechanical click heard  Respiratory: CTA B  Abdomen: Soft, nontender, nondistended, bowel sounds present  Musculoskeletal: No pedal edema bilaterally  Skin: No rashes/ulcers noted  Psychiatry: Normal mood   Data Reviewed: CBC: Recent Labs  Lab 11/12/17 0500 11/12/17 2133 11/13/17 0356 11/14/17 0439 11/15/17 0345 11/16/17 0438  WBC 10.7*  --  8.9 6.3 5.8 5.3  HGB 7.5* 9.9* 9.5* 9.0*  8.9* 9.4*  HCT 23.4* 31.3* 29.9* 28.7* 28.4* 30.6*  MCV 94.4  --  91.7 92.9 93.4 95.0  PLT 222  --  215 206 217 159   Basic Metabolic Panel: Recent Labs  Lab 11/10/17 0435 11/11/17 0337 11/12/17 0500 11/14/17 0439 11/15/17 0345  NA 138 136 135 141 139  K 4.4 4.0 3.9 3.5 3.8  CL 109 110  107 108 107  CO2 24 20* 21* 24 26  GLUCOSE 113* 105* 96 118* 100*  BUN 19 18 14 14 12   CREATININE 1.04 0.97 0.88 1.15 0.93  CALCIUM 7.8* 7.5* 7.6* 7.6* 8.1*   GFR: Estimated Creatinine Clearance: 64.4 mL/min (by C-G formula based on SCr of 0.93 mg/dL). Liver Function Tests: Recent Labs  Lab 11/12/17 0500  AST 56*  ALT 46*  ALKPHOS 53  BILITOT 0.7  PROT 4.6*  ALBUMIN 2.5*   No results for input(s): LIPASE, AMYLASE in the last 168 hours. No results for input(s): AMMONIA in the last 168 hours. Coagulation Profile: Recent Labs  Lab 11/10/17 0435 11/10/17 1752 11/11/17 0337 11/12/17 0500 11/16/17 0438  INR 1.59 2.00 2.16 1.19 1.12   Cardiac Enzymes: No results for input(s): CKTOTAL, CKMB, CKMBINDEX, TROPONINI in the last 168 hours. BNP (last 3 results) No results for input(s): PROBNP in the last 8760 hours. HbA1C: No results for input(s): HGBA1C in the last 72 hours. CBG: Recent Labs  Lab 11/10/17 2048  GLUCAP 111*   Lipid Profile: No results for input(s): CHOL, HDL, LDLCALC, TRIG, CHOLHDL, LDLDIRECT in the last 72 hours. Thyroid Function Tests: No results for input(s): TSH, T4TOTAL, FREET4, T3FREE, THYROIDAB in the last 72 hours. Anemia Panel: Recent Labs    11/16/17 0438  TIBC 273  IRON 38*   Urine analysis:    Component Value Date/Time   COLORURINE AMBER (A) 04/17/2012 1751   APPEARANCEUR CLOUDY (A) 04/17/2012 1751   LABSPEC 1.027 04/17/2012 1751   PHURINE 6.0 04/17/2012 1751   GLUCOSEU NEGATIVE 04/17/2012 1751   HGBUR NEGATIVE 04/17/2012 1751   BILIRUBINUR SMALL (A) 04/17/2012 1751   KETONESUR 15 (A) 04/17/2012 1751   PROTEINUR 30 (A) 04/17/2012 1751   UROBILINOGEN 1.0 04/17/2012 1751   NITRITE NEGATIVE 04/17/2012 1751   LEUKOCYTESUR LARGE (A) 04/17/2012 1751   Sepsis Labs: @LABRCNTIP (procalcitonin:4,lacticidven:4)  ) Recent Results (from the past 240 hour(s))  Culture, Urine     Status: None   Collection Time: 11/10/17  9:59 PM  Result  Value Ref Range Status   Specimen Description URINE, CATHETERIZED  Final   Special Requests NONE  Final   Culture   Final    NO GROWTH Performed at [facility], [place] [address], [place], [place] [address]    Report Status 11/12/2017 FINAL  Final  Culture, blood (routine x 2)     Status: None   Collection Time: 11/10/17 11:12 PM  Result Value Ref Range Status   Specimen Description BLOOD RIGHT ANTECUBITAL  Final   Special Requests   Final    BOTTLES DRAWN AEROBIC AND ANAEROBIC Blood Culture adequate volume   Culture   Final    NO GROWTH 5 DAYS Performed at [facility], [place] [address] [place], [place] [address]    Report Status 11/16/2017 FINAL  Final  Culture, blood (routine x 2)     Status: None   Collection Time: 11/10/17 11:19 PM  Result Value Ref Range Status   Specimen Description BLOOD RIGHT HAND  Final   Special Requests  Final    BOTTLES DRAWN AEROBIC AND ANAEROBIC Blood Culture adequate volume   Culture   Final    NO GROWTH 5 DAYS Performed at [facility], [place] [address], [place], [place] [address]    Report Status 11/16/2017 FINAL  Final      Studies: No results found.  Scheduled Meds: . atorvastatin  10 mg Oral q1800  . cholecalciferol  1,000 Units Oral Daily  . ferrous sulfate  325 mg Oral QODAY  . metoprolol succinate  100 mg Oral BID  . omega-3 acid ethyl esters  1,000 mg Oral Daily  . pantoprazole  40 mg Oral BID AC  . sucralfate  1 g Oral TID WC & HS  . tamsulosin  0.4 mg Oral QHS  . vitamin C  500 mg Oral Daily  . Warfarin - Pharmacist Dosing Inpatient   Does not apply q1800    Continuous Infusions: . sodium chloride Stopped (11/12/17 2156)  . heparin 1,250 Units/hr (11/16/17 8270)     LOS: 11 days     Alma Friendly, MD [facility]   If 7PM-7AM, please contact night-coverage [email or web address] 11/16/2017, 1:22 PM

## 2017-11-16 NOTE — Care Management Important Message (Signed)
Important Message  Patient Details  Name: Timothy Fritz Sr. MRN: [id number] Date of Birth: 1940-05-05   Medicare Important Message Given:  Yes    Barb Merino Ozelle Brubacher 11/16/2017, 12:56 PM

## 2017-11-16 NOTE — Progress Notes (Addendum)
ANTICOAGULATION CONSULT NOTE  Pharmacy Consult:  Heparin and Coumadin Indication: Mechanical AVR and atrial fibrillation  No Known Allergies  Patient Measurements: Height: 5\' 8"  (172.7 cm) Weight: 180 lb 3.2 oz (81.7 kg) IBW/kg (Calculated) : 68.4  Heparin dosing weight: 82 kg  Vital Signs: Temp: 97.9 F (36.6 C) (10/03 1410) Temp Source: Oral (10/03 1410) BP: 105/73 (10/03 1410) Pulse Rate: 80 (10/03 1410)  Labs: Recent Labs    11/14/17 0439 11/14/17 2006 11/15/17 0345 11/16/17 0438  HGB 9.0*  --  8.9* 9.4*  HCT 28.7*  --  28.4* 30.6*  PLT 206  --  217 211  LABPROT  --   --   --  14.3  INR  --   --   --  1.12  HEPARINUNFRC  --  0.24* 0.30 0.39  CREATININE 1.15  --  0.93  --     Estimated Creatinine Clearance: 64.4 mL/min (by C-G formula based on SCr of 0.93 mg/dL).   Assessment: 22 YOF with history of mechanical AVR and AFib on Coumadin 5mg  PO daily PTA.  Patient had a lower GIB, s/p embolization on 11/10/17, then deemed stable enough for resumption of IV heparin on 11/14/17.  Coumadin resumed on 10/2.   Patient received FFP/Vitamin K earlier in the hospitalization.  Expect slow rise in INR, especially with conservative dosing to try to avoid re-bleeding.  No bleeding noted.    Heparin level is therapeutic (0.39) on 1250 units/hr. Targeting low-therapeutic levels.   INR 1.12 after Coumadin 6 mg x 1 last night.   Goal of Therapy:  Heparin level 0.3-0.5 units/ml INR 2.5-3.5 (prefer ~3.0 per cardiology) Monitor platelets by anticoagulation protocol: Yes  Plan:   Continue heparin drip at 1250 units/hr  Coumadin 6 mg again today  Daily heparin level, PT/INR and CBC.  Monitor closely for s/sx of bleeding  Claudie Fisherman Pager: [phone] or phone: [phone] 11/16/2017 3:41 PM

## 2017-11-16 NOTE — Progress Notes (Signed)
[facility] PROGRESS NOTE Subjective Patient has moved to the floor no signs of further bleeding.  Has been on heparin infusion and now is being transitioned to Coumadin  Objective: Vital signs in last 24 hours: Temp:  [97.8 F (36.6 C)-98.4 F (36.9 C)] 98.4 F (36.9 C) (10/03 0500) Pulse Rate:  [80-107] 82 (10/03 0500) Resp:  [13-31] 18 (10/03 0500) BP: (100-130)/(9-92) 100/9 (10/03 0500) SpO2:  [98 %-100 %] 100 % (10/03 0500) Weight:  [81.7 kg-89.5 kg] 81.7 kg (10/03 0500) Last BM Date: 11/15/17  Intake/Output from previous day: 10/02 0701 - 10/03 0700 In: 1192.4 [P.O.:930; I.V.:262.4] Out: 825 [Urine:825] Intake/Output this shift: Total I/O In: 360 [P.O.:360] Out: 175 [Urine:175]    Lab Results: Recent Labs    11/14/17 0439 11/15/17 0345 11/16/17 0438  WBC 6.3 5.8 5.3  HGB 9.0* 8.9* 9.4*  HCT 28.7* 28.4* 30.6*  PLT 206 217 211   BMET Recent Labs    11/14/17 0439 11/15/17 0345  NA 141 139  K 3.5 3.8  CL 108 107  CO2 24 26  CREATININE 1.15 0.93   LFT No results for input(s): PROT, AST, ALT, ALKPHOS, BILITOT, BILIDIR, IBILI in the last 72 hours. PT/INR Recent Labs    11/16/17 0438  LABPROT 14.3  INR 1.12   PANCREAS No results for input(s): LIPASE in the last 72 hours.       Studies/Results: No results found.  Medications: I have reviewed the patient's current medications.  Assessment:   1.  GI bleeding.  Likely due to documented ulcerations of the duodenum and stomach.  Has done well on PPI.   Plan: He is currently on maximum antiulcer therapy with both Carafate and twice daily Protonix.  He probably does not need to be on this indefinitely.  I think it would be reasonable to continue him on both of these medications for 2 more weeks to get plenty of time for healing of his ulcers.  After that I will place him on a single dose of PPI indefinitely.  Protonix, Nexium, omeprazole would all be acceptable. Would have him follow-up  with Dr. Therisa Doyne in about 2 months.  He may need an upper GI or something just to be certain that the gastric ulcers have healed up since there is some potential risk of gastric carcinoma. We will sign off please call us as needed.  Nancy Fetter 11/16/2017, 1:43 PM  This note was created using voice recognition software. Minor errors may Have occurred unintentionally.  Pager: [phone] If no answer or after hours call [phone]

## 2017-11-16 NOTE — Care Management Important Message (Deleted)
Important Message  Patient Details  Name: Timothy BRAMER Sr. MRN: [id number] Date of Birth: 1940-05-05   Medicare Important Message Given:  Yes    Timothy Fritz Jasai Sorg 11/16/2017, 1:27 PM

## 2017-11-16 NOTE — Plan of Care (Signed)
  Problem: Health Behavior/Discharge Planning: Goal: Ability to manage health-related needs will improve Outcome: Progressing   Problem: Clinical Measurements: Goal: Ability to maintain clinical measurements within normal limits will improve Outcome: Progressing Goal: Will remain free from infection Outcome: Progressing Goal: Diagnostic test results will improve Outcome: Progressing Goal: Cardiovascular complication will be avoided Outcome: Progressing   Problem: Activity: Goal: Risk for activity intolerance will decrease Outcome: Progressing   Problem: Nutrition: Goal: Adequate nutrition will be maintained Outcome: Progressing   Problem: Elimination: Goal: Will not experience complications related to bowel motility Outcome: Progressing   Problem: Pain Managment: Goal: General experience of comfort will improve Outcome: Progressing   Problem: Safety: Goal: Ability to remain free from injury will improve Outcome: Progressing   Problem: Education: Goal: Ability to identify signs and symptoms of gastrointestinal bleeding will improve Outcome: Progressing   Problem: Bowel/Gastric: Goal: Will show no signs and symptoms of gastrointestinal bleeding Outcome: Progressing   Problem: Fluid Volume: Goal: Will show no signs and symptoms of excessive bleeding Outcome: Progressing   Problem: Clinical Measurements: Goal: Complications related to the disease process, condition or treatment will be avoided or minimized Outcome: Progressing

## 2017-11-16 NOTE — Discharge Instructions (Signed)

## 2017-11-16 NOTE — Progress Notes (Signed)
Progress Note  Patient Name: Timothy MICHIE Sr. Date of Encounter: 11/16/2017  Primary Cardiologist: Rozann Lesches, MD  Subjective   No bleeding. Stable on heparin for 48 hours without hemodynamic decline. Restarted warfarin last night. Inpatient Medications    Scheduled Meds: . atorvastatin  10 mg Oral q1800  . cholecalciferol  1,000 Units Oral Daily  . ferrous sulfate  325 mg Oral QODAY  . metoprolol succinate  100 mg Oral BID  . omega-3 acid ethyl esters  1,000 mg Oral Daily  . pantoprazole  40 mg Oral BID AC  . sucralfate  1 g Oral TID WC & HS  . tamsulosin  0.4 mg Oral QHS  . vitamin C  500 mg Oral Daily  . Warfarin - Pharmacist Dosing Inpatient   Does not apply q1800   Continuous Infusions: . sodium chloride Stopped (11/12/17 2156)  . heparin 1,250 Units/hr (11/16/17 6468)   PRN Meds: sodium chloride, acetaminophen, meclizine, senna-docusate, traMADol   Vital Signs    Vitals:   11/15/17 2230 11/16/17 0500 11/16/17 1410 11/16/17 1935  BP: 130/69 (!) 100/9 105/73 106/62  Pulse: 82 82 80 91  Resp:  18 15 16   Temp:  98.4 F (36.9 C) 97.9 F (36.6 C)   TempSrc:  Oral Oral Oral  SpO2:  100% 97% 100%  Weight:  81.7 kg    Height:        Intake/Output Summary (Last 24 hours) at 11/16/2017 2328 Last data filed at 11/16/2017 2200 Gross per 24 hour  Intake 1373.75 ml  Output 825 ml  Net 548.75 ml   Filed Weights   11/15/17 0500 11/15/17 2005 11/16/17 0500  Weight: 89.1 kg 89.5 kg 81.7 kg    Telemetry    Intermittent biventricular pacing with wide complexes possibly representing afib with aberrant conduction vs ventricular ectopy- Personally Reviewed  ECG    Intermittent biventricular pacing with wide complexes possibly representing afib with aberrant conduction vs ventricular ectopy - 11/13/17  Physical Exam   GEN: No acute distress, lying comfortably in bed.   Neck: No JVD Cardiac: irregular rhythm, rate 80s-90s. Crisp mechanical valve closure  sound at S2. No murmurs.  Respiratory: Clear to auscultation bilaterally. GI: Soft, nontender, non-distended  MS: No edema; No deformity. SCDs in place Neuro:  Nonfocal  Psych: Normal affect   Labs    Chemistry Recent Labs  Lab 11/12/17 0500 11/14/17 0439 11/15/17 0345  NA 135 141 139  K 3.9 3.5 3.8  CL 107 108 107  CO2 21* 24 26  GLUCOSE 96 118* 100*  BUN 14 14 12   CREATININE 0.88 1.15 0.93  CALCIUM 7.6* 7.6* 8.1*  PROT 4.6*  --   --   ALBUMIN 2.5*  --   --   AST 56*  --   --   ALT 46*  --   --   ALKPHOS 53  --   --   BILITOT 0.7  --   --   GFRNONAA >60 60* >60  GFRAA >60 >60 >60  ANIONGAP 7 9 6      Hematology Recent Labs  Lab 11/14/17 0439 11/15/17 0345 11/16/17 0438  WBC 6.3 5.8 5.3  RBC 3.09* 3.04* 3.22*  HGB 9.0* 8.9* 9.4*  HCT 28.7* 28.4* 30.6*  MCV 92.9 93.4 95.0  MCH 29.1 29.3 29.2  MCHC 31.4 31.3 30.7  RDW 17.5* 17.2* 16.8*  PLT 206 217 211    Cardiac EnzymesNo results for input(s): TROPONINI in the last 168 hours. No  results for input(s): TROPIPOC in the last 168 hours.   BNPNo results for input(s): BNP, PROBNP in the last 168 hours.   DDimer No results for input(s): DDIMER in the last 168 hours.   Radiology    No results found.  Cardiac Studies   Echocardiogram: - Left ventricle: The cavity size was normal. Systolic function was   mildly to moderately reduced. The estimated ejection fraction was   in the range of 40% to 45%. - Aortic valve: Mildly calcified annulus. Moderately thickened,   moderately calcified leaflets. There was moderate stenosis. Valve   area (VTI): 1.59 cm^2. Valve area (Vmax): 1.61 cm^2. Valve area   (Vmean): 1.34 cm^2. - Mitral valve: There was mild regurgitation. - Left atrium: The atrium was moderately dilated. - Right atrium: The atrium was mildly dilated. - Pulmonary arteries: Systolic pressure was mildly increased. PA   peak pressure: 34 mm Hg (S).  Patient Profile     77 y.o. malewith a hx of aortic  aneurysm status post Bentall procedure in 2002 with mechanical AVR, coronary artery disease nonobstructive in 2002, hypertension, hyperlipidemia, nonischemic cardiomyopathy status post Saint Jude CRT-D in 2014, and persistent atrial fibrillationwho was admitted after multipleinappropriate ICD shocks, due to rapid afib.  Patient found to have a GI bleed.  He was found to have duodenal ulcers, polyps and diverticulosis with GI evaluation.  Recurrent bleeding and nuclear scan showed bleeding from proximal small bowel versus duodenum.  Had empiric embolization of GDA. Last echocardiogram September 2019 showed ejection fraction 40 to 45%, mechanical aortic valve with mean gradient 15 mmHg, mild mitral regurgitation and biatrial enlargement.  Assessment & Plan    Principal Problem:   GI bleed Active Problems:   Essential hypertension, benign   Atrial fibrillation with rapid ventricular response (HCC) -status post cardioversion   S/P aortic valve replacement with metallic valve   Chronic combined systolic and diastolic heart failure (HCC)   ICD (implantable cardioverter-defibrillator) in place   Supratherapeutic INR   Acute blood loss anemia  Mr. Skowron has received blood products and has had an appropriate rise in his hemoglobin,which has remained stable. His valve has a crisp closure sound, so at present it seems to be functioning well. Warfarin has been reinitiated. Goal INR of 3.0 given age of 77 mechanical valve in the aortic position and additional risk factor for thrombosis of atrial fibrillation.  He can be bridged with enoxaparin if he would like to leave the hospital, though in the setting of bleeding heparin infusion is preferred.  - Please check daily CBC and INR.  Cardiology will follow along.  For questions or updates, please contact [place] Please consult [email or web address] for contact info under      Signed, Elouise Munroe, MD  11/16/2017, 1:32 PM

## 2017-11-17 LAB — BASIC METABOLIC PANEL
Anion gap: 4 — ABNORMAL LOW (ref 5–15)
BUN: 10 mg/dL (ref 8–23)
CALCIUM: 8.3 mg/dL — AB (ref 8.9–10.3)
CO2: 26 mmol/L (ref 22–32)
CREATININE: 0.86 mg/dL (ref 0.61–1.24)
Chloride: 109 mmol/L (ref 98–111)
GFR calc Af Amer: >60 mL/min (ref >60)
GFR calc non Af Amer: >60 mL/min (ref >60)
GLUCOSE: 96 mg/dL (ref 70–99)
Potassium: 3.9 mmol/L (ref 3.5–5.1)
Sodium: 139 mmol/L (ref 135–145)

## 2017-11-17 LAB — PROTIME-INR
INR: 1.12
Prothrombin Time: 14.4 seconds (ref 11.4–15.2)

## 2017-11-17 LAB — CBC
HCT: 30.9 % — ABNORMAL LOW (ref 39.0–52.0)
Hemoglobin: 9.6 g/dL — ABNORMAL LOW (ref 13.0–17.0)
MCH: 29.6 pg (ref 26.0–34.0)
MCHC: 31.1 g/dL (ref 30.0–36.0)
MCV: 95.4 fL (ref 78.0–100.0)
PLATELETS: 209 10*3/uL (ref 150–400)
RBC: 3.24 MIL/uL — ABNORMAL LOW (ref 4.22–5.81)
RDW: 16.5 % — ABNORMAL HIGH (ref 11.5–15.5)
WBC: 4.6 10*3/uL (ref 4.0–10.5)

## 2017-11-17 LAB — HEPARIN LEVEL (UNFRACTIONATED): Heparin Unfractionated: 0.47 IU/mL (ref 0.30–0.70)

## 2017-11-17 MED ORDER — WARFARIN SODIUM 7.5 MG PO TABS
7.5000 mg | ORAL_TABLET | Freq: Once | ORAL | Status: AC
  Administered 2017-11-17: 7.5 mg via ORAL
  Filled 2017-11-17: qty 1

## 2017-11-17 NOTE — Progress Notes (Signed)
ANTICOAGULATION CONSULT NOTE - Follow Up Consult  Pharmacy Consult for Heparin, Coumadimn Indication: Mechanical valve + afib  No Known Allergies  Patient Measurements: Height: 5\' 8"  (172.7 cm) Weight: 195 lb 4.8 oz (88.6 kg) IBW/kg (Calculated) : 68.4 Heparin Dosing Weight:  82 kg  Vital Signs:    Labs: Recent Labs    11/15/17 0345 11/16/17 0438 11/17/17 0511  HGB 8.9* 9.4* 9.6*  HCT 28.4* 30.6* 30.9*  PLT 217 211 209  LABPROT  --  14.3 14.4  INR  --  1.12 1.12  HEPARINUNFRC 0.30 0.39 0.47  CREATININE 0.93  --  0.86    Estimated Creatinine Clearance: 77.8 mL/min (by C-G formula based on SCr of 0.86 mg/dL).   Assessment:  Anticoag: Coumadin PTA, now Heparin and warfarin for mech AVR and afib. Significant GIB requiring embolization on 9/29. Heparin level 0.47. INR 1.12 this AM. Hgb 9.6 stable. Plts 209 stable.  - S/p FFP and 5mg  vit k (5mg  IV 9/24, 5mg  po 9/27, 5mg  IV 9/28) - PTA Warfarin dose 5 mg/day. INR goal is 2-3 per MD notes, latest attending note indicates desired INR 3 per cards note Admit INR 8.35 due to Bactrim use.  Goal of Therapy:  INR 3  Heparin level 0.3-0.5 with recurrent bleeding Monitor platelets by anticoagulation protocol: Yes   Plan:  Heparin 1250 units/hr Coumadin 7.5mg  po x 1 tonight Daily HL, CBC, and INR  Annalaya Wile S. Alford Highland, PharmD, BCPS Clinical Staff Pharmacist Pager [phone]  Eilene Ghazi Stillinger 11/17/2017,8:33 AM

## 2017-11-17 NOTE — Progress Notes (Signed)
PROGRESS NOTE  Timothy Fritz. VCB:[id number] DOB: [birth year] DOA: 11/05/2017 PCP: [name], MD  HPI/Recap of past 24 hours: HPI from Dr [name] Patient is a 77 year old male with past medical history of atrial fibrillation, mechanical aortic valve on warfarin, nonischemic cardiomyopathy status post AICD presented to the hospital with complaints of melena.  EGD and colonoscopy were performed on 11/08/2017.  EGD showed 3 large nonbleeding ulcers with discoloration at the bases.  Patient also underwent embolization of the gastroduodenal artery on 11/11/2017.  Colonoscopy showed 2 polyps that were removed.  Patient has received a total of 5 units of PRBC.  GI, cardiology following- anticoagulation restarted with heparin drip on 11/14/2017 with close follow-up.   Today, pt denied any new complaints. No signs of bleeding noted  Assessment/Plan: Principal Problem:   GI bleed Active Problems:   Essential hypertension, benign   Atrial fibrillation with rapid ventricular response (HCC) -status post cardioversion   S/P aortic valve replacement with metallic valve   Chronic combined systolic and diastolic heart failure (HCC)   ICD (implantable cardioverter-defibrillator) in place   Supratherapeutic INR   Acute blood loss anemia  Upper GI bleed with acute blood loss anemia Stable Likely due to duodenal ulcers in conjunction with warfarin Status post transfusion of 5U of PRBC this admission GI on board: Status post EGD which showed 3 large nonbleeding ulcers, colonoscopy status post removal of 2 polyps, and embolization of the gastroduodenal artery Cardiology on board for management of anticoagulation as patient has a mechanical aortic valve Currently on IV heparin, resumed coumadin, plan to d/c home on lovenox Continue PPI Monitor closely  Paroxysmal A. Fib Rate controlled Continue metoprolol, heparin drip, resumed coumadin  Chronic combined systolic/diastolic HF Appears  euvolemic Echo done on 11/06/2017 showed EF of 40 to 45% Diuretics on hold, to GI bleed  Hypertension Stable Continue metoprolol  Mechanical aortic valve replacement Continue on heparin drip, resumed coumadin Goal INR 3,monitor closely     Code Status: Full  Family Communication: None at bedside  Disposition Plan: Likely home on 11/18/17   Consultants:  GI  Cardiology  Procedures:  Mesenteric angiogram embolization of gastroduodenal artery on 9/27  EGD/colonoscopy on 9/25  Antimicrobials:  None  DVT prophylaxis: IV heparin drip, coumadin   Objective: Vitals:   11/16/17 0500 11/16/17 1410 11/16/17 1935 11/17/17 0507  BP: (!) 100/9 105/73 106/62   Pulse: 82 80 91   Resp: 18 15 16    Temp: 98.4 F (36.9 C) 97.9 F (36.6 C)    TempSrc: Oral Oral Oral   SpO2: 100% 97% 100%   Weight: 81.7 kg   88.6 kg  Height:        Intake/Output Summary (Last 24 hours) at 11/17/2017 1412 Last data filed at 11/17/2017 1158 Gross per 24 hour  Intake 805 ml  Output 1675 ml  Net -870 ml   Filed Weights   11/15/17 2005 11/16/17 0500 11/17/17 0507  Weight: 89.5 kg 81.7 kg 88.6 kg    Exam:   General: NAD  Cardiovascular: S1, S2 present, mechanical click heard  Respiratory: CTA B  Abdomen: Soft, nontender, nondistended, bowel sounds present  Musculoskeletal: No pedal edema bilaterally  Skin: No rashes/ulcers noted  Psychiatry: Normal mood   Data Reviewed: CBC: Recent Labs  Lab 11/13/17 0356 11/14/17 0439 11/15/17 0345 11/16/17 0438 11/17/17 0511  WBC 8.9 6.3 5.8 5.3 4.6  HGB 9.5* 9.0* 8.9* 9.4* 9.6*  HCT 29.9* 28.7* 28.4* 30.6* 30.9*  MCV  91.7 92.9 93.4 95.0 95.4  PLT 215 206 217 211 270   Basic Metabolic Panel: Recent Labs  Lab 11/11/17 0337 11/12/17 0500 11/14/17 0439 11/15/17 0345 11/17/17 0511  NA 136 135 141 139 139  K 4.0 3.9 3.5 3.8 3.9  CL 110 107 108 107 109  CO2 20* 21* 24 26 26   GLUCOSE 105* 96 118* 100* 96  BUN 18 14 14 12  10   CREATININE 0.97 0.88 1.15 0.93 0.86  CALCIUM 7.5* 7.6* 7.6* 8.1* 8.3*   GFR: Estimated Creatinine Clearance: 77.8 mL/min (by C-G formula based on SCr of 0.86 mg/dL). Liver Function Tests: Recent Labs  Lab 11/12/17 0500  AST 56*  ALT 46*  ALKPHOS 53  BILITOT 0.7  PROT 4.6*  ALBUMIN 2.5*   No results for input(s): LIPASE, AMYLASE in the last 168 hours. No results for input(s): AMMONIA in the last 168 hours. Coagulation Profile: Recent Labs  Lab 11/10/17 1752 11/11/17 0337 11/12/17 0500 11/16/17 0438 11/17/17 0511  INR 2.00 2.16 1.19 1.12 1.12   Cardiac Enzymes: No results for input(s): CKTOTAL, CKMB, CKMBINDEX, TROPONINI in the last 168 hours. BNP (last 3 results) No results for input(s): PROBNP in the last 8760 hours. HbA1C: No results for input(s): HGBA1C in the last 72 hours. CBG: Recent Labs  Lab 11/10/17 2048  GLUCAP 111*   Lipid Profile: No results for input(s): CHOL, HDL, LDLCALC, TRIG, CHOLHDL, LDLDIRECT in the last 72 hours. Thyroid Function Tests: No results for input(s): TSH, T4TOTAL, FREET4, T3FREE, THYROIDAB in the last 72 hours. Anemia Panel: Recent Labs    11/16/17 0438  TIBC 273  IRON 38*   Urine analysis:    Component Value Date/Time   COLORURINE AMBER (A) 04/17/2012 1751   APPEARANCEUR CLOUDY (A) 04/17/2012 1751   LABSPEC 1.027 04/17/2012 1751   PHURINE 6.0 04/17/2012 1751   GLUCOSEU NEGATIVE 04/17/2012 1751   HGBUR NEGATIVE 04/17/2012 1751   BILIRUBINUR SMALL (A) 04/17/2012 1751   KETONESUR 15 (A) 04/17/2012 1751   PROTEINUR 30 (A) 04/17/2012 1751   UROBILINOGEN 1.0 04/17/2012 1751   NITRITE NEGATIVE 04/17/2012 1751   LEUKOCYTESUR LARGE (A) 04/17/2012 1751   Sepsis Labs: @LABRCNTIP (procalcitonin:4,lacticidven:4)  ) Recent Results (from the past 240 hour(s))  Culture, Urine     Status: None   Collection Time: 11/10/17  9:59 PM  Result Value Ref Range Status   Specimen Description URINE, CATHETERIZED  Final   Special  Requests NONE  Final   Culture   Final    NO GROWTH Performed at [facility], [place] [address], [place], [place] [address]    Report Status 11/12/2017 FINAL  Final  Culture, blood (routine x 2)     Status: None   Collection Time: 11/10/17 11:12 PM  Result Value Ref Range Status   Specimen Description BLOOD RIGHT ANTECUBITAL  Final   Special Requests   Final    BOTTLES DRAWN AEROBIC AND ANAEROBIC Blood Culture adequate volume   Culture   Final    NO GROWTH 5 DAYS Performed at [facility], [place] [address], [place], [place] [address]    Report Status 11/16/2017 FINAL  Final  Culture, blood (routine x 2)     Status: None   Collection Time: 11/10/17 11:19 PM  Result Value Ref Range Status   Specimen Description BLOOD RIGHT HAND  Final   Special Requests   Final    BOTTLES DRAWN AEROBIC AND ANAEROBIC Blood Culture adequate volume   Culture  Final    NO GROWTH 5 DAYS Performed at [facility], [place] [address] [place], [place] [address]    Report Status 11/16/2017 FINAL  Final      Studies: No results found.  Scheduled Meds: . atorvastatin  10 mg Oral q1800  . cholecalciferol  1,000 Units Oral Daily  . ferrous sulfate  325 mg Oral QODAY  . metoprolol succinate  100 mg Oral BID  . omega-3 acid ethyl esters  1,000 mg Oral Daily  . pantoprazole  40 mg Oral BID AC  . sucralfate  1 g Oral TID WC & HS  . tamsulosin  0.4 mg Oral QHS  . vitamin C  500 mg Oral Daily  . warfarin  7.5 mg Oral ONCE-1800  . Warfarin - Pharmacist Dosing Inpatient   Does not apply q1800    Continuous Infusions: . sodium chloride Stopped (11/12/17 2156)  . heparin 1,250 Units/hr (11/17/17 0502)     LOS: 12 days     Alma Friendly, MD [facility]   If 7PM-7AM, please contact night-coverage [email or web address] 11/17/2017, 2:12 PM

## 2017-11-17 NOTE — Progress Notes (Signed)
Progress Note  Patient Name: Timothy LEVESQUE Sr. Date of Encounter: 11/17/2017  Primary Cardiologist: Rozann Lesches, MD  Subjective   No bleeding. Stable on heparin for 72 hours without hemodynamic decline. Restarted warfarin 11/15/17 pm. He feels well but would like to stay in the hospital an additional day for bridging before considering enoxaparin at home. He is comfortable using enoxaparin injections if needed. Inpatient Medications    Scheduled Meds: . atorvastatin  10 mg Oral q1800  . cholecalciferol  1,000 Units Oral Daily  . ferrous sulfate  325 mg Oral QODAY  . metoprolol succinate  100 mg Oral BID  . omega-3 acid ethyl esters  1,000 mg Oral Daily  . pantoprazole  40 mg Oral BID AC  . sucralfate  1 g Oral TID WC & HS  . tamsulosin  0.4 mg Oral QHS  . vitamin C  500 mg Oral Daily  . warfarin  7.5 mg Oral ONCE-1800  . Warfarin - Pharmacist Dosing Inpatient   Does not apply q1800   Continuous Infusions: . sodium chloride Stopped (11/12/17 2156)  . heparin 1,250 Units/hr (11/17/17 0502)   PRN Meds: sodium chloride, acetaminophen, meclizine, senna-docusate, traMADol   Vital Signs    Vitals:   11/16/17 0500 11/16/17 1410 11/16/17 1935 11/17/17 0507  BP: (!) 100/9 105/73 106/62   Pulse: 82 80 91   Resp: 18 15 16    Temp: 98.4 F (36.9 C) 97.9 F (36.6 C)    TempSrc: Oral Oral Oral   SpO2: 100% 97% 100%   Weight: 81.7 kg   88.6 kg  Height:        Intake/Output Summary (Last 24 hours) at 11/17/2017 1116 Last data filed at 11/17/2017 0701 Gross per 24 hour  Intake 1045 ml  Output 1125 ml  Net -80 ml   Filed Weights   11/15/17 2005 11/16/17 0500 11/17/17 0507  Weight: 89.5 kg 81.7 kg 88.6 kg    Telemetry    Intermittent biventricular pacing with wide complexes possibly representing afib with aberrant conduction vs ventricular ectopy- Personally Reviewed  ECG    Intermittent biventricular pacing with wide complexes possibly representing afib with  aberrant conduction vs ventricular ectopy - 11/13/17  Physical Exam   GEN: No acute distress, lying comfortably in bed.   Neck: No JVD Cardiac: irregular rhythm, rate 80s-90s. Crisp mechanical valve closure sound at S2. No murmurs.  Respiratory: Clear to auscultation bilaterally. GI: Soft, nontender, non-distended  MS: No edema; No deformity. SCDs in place Neuro:  Nonfocal  Psych: Normal affect   Labs    Chemistry Recent Labs  Lab 11/12/17 0500 11/14/17 0439 11/15/17 0345 11/17/17 0511  NA 135 141 139 139  K 3.9 3.5 3.8 3.9  CL 107 108 107 109  CO2 21* 24 26 26   GLUCOSE 96 118* 100* 96  BUN 14 14 12 10   CREATININE 0.88 1.15 0.93 0.86  CALCIUM 7.6* 7.6* 8.1* 8.3*  PROT 4.6*  --   --   --   ALBUMIN 2.5*  --   --   --   AST 56*  --   --   --   ALT 46*  --   --   --   ALKPHOS 53  --   --   --   BILITOT 0.7  --   --   --   GFRNONAA >60 60* >60 >60  GFRAA >60 >60 >60 >60  ANIONGAP 7 9 6  4*     Hematology Recent  Labs  Lab 11/15/17 0345 11/16/17 0438 11/17/17 0511  WBC 5.8 5.3 4.6  RBC 3.04* 3.22* 3.24*  HGB 8.9* 9.4* 9.6*  HCT 28.4* 30.6* 30.9*  MCV 93.4 95.0 95.4  MCH 29.3 29.2 29.6  MCHC 31.3 30.7 31.1  RDW 17.2* 16.8* 16.5*  PLT 217 211 209    Cardiac EnzymesNo results for input(s): TROPONINI in the last 168 hours. No results for input(s): TROPIPOC in the last 168 hours.   BNPNo results for input(s): BNP, PROBNP in the last 168 hours.   DDimer No results for input(s): DDIMER in the last 168 hours.   Radiology    No results found.  Cardiac Studies   Echocardiogram: - Left ventricle: The cavity size was normal. Systolic function was   mildly to moderately reduced. The estimated ejection fraction was   in the range of 40% to 45%. - Aortic valve: Mildly calcified annulus. Moderately thickened,   moderately calcified leaflets. There was moderate stenosis. Valve   area (VTI): 1.59 cm^2. Valve area (Vmax): 1.61 cm^2. Valve area   (Vmean): 1.34 cm^2. -  Mitral valve: There was mild regurgitation. - Left atrium: The atrium was moderately dilated. - Right atrium: The atrium was mildly dilated. - Pulmonary arteries: Systolic pressure was mildly increased. PA   peak pressure: 34 mm Hg (S).  Patient Profile     77 y.o. malewith a hx of aortic aneurysm status post Bentall procedure in 2002 with mechanical AVR, coronary artery disease nonobstructive in 2002, hypertension, hyperlipidemia, nonischemic cardiomyopathy status post Saint Jude CRT-D in 2014, and persistent atrial fibrillationwho was admitted after multipleinappropriate ICD shocks, due to rapid afib.  Patient found to have a GI bleed.  He was found to have duodenal ulcers, polyps and diverticulosis with GI evaluation.  Recurrent bleeding and nuclear scan showed bleeding from proximal small bowel versus duodenum.  Had empiric embolization of GDA. Last echocardiogram September 2019 showed ejection fraction 40 to 45%, mechanical aortic valve with mean gradient 15 mmHg, mild mitral regurgitation and biatrial enlargement.  Assessment & Plan    Principal Problem:   GI bleed Active Problems:   Essential hypertension, benign   Atrial fibrillation with rapid ventricular response (HCC) -status post cardioversion   S/P aortic valve replacement with metallic valve   Chronic combined systolic and diastolic heart failure (HCC)   ICD (implantable cardioverter-defibrillator) in place   Supratherapeutic INR   Acute blood loss anemia  [name] has received blood products and has had an appropriate rise in his hemoglobin,which has remained stable. His valve has a crisp closure sound, so at present it seems to be functioning well. Warfarin has been reinitiated. Goal INR of 3.0 given age of mechanical valve in the aortic position and additional risk factor for thrombosis of atrial fibrillation.  He can be bridged with enoxaparin if he would like to leave the hospital, though in the setting of bleeding  heparin infusion is preferred for careful monitoring.  RECOMMENDATIONS:  - For his mechanical aortic valve, he should be initiated on aspirin 81 mg daily in addition to warfarin anticoagulation with INR goal of 3.0, as per ACC/AHA Valve Guidelines. This can be started on hospital dismissal or close follow up.   - Please check daily CBC and INR.  Cardiology will follow along.  For questions or updates, please contact [place] Please consult [email or web address] for contact info under      Signed, Elouise Munroe, MD  11/17/2017 11:25 AM

## 2017-11-18 LAB — CBC
HCT: 30.3 % — ABNORMAL LOW (ref 39.0–52.0)
Hemoglobin: 9.3 g/dL — ABNORMAL LOW (ref 13.0–17.0)
MCH: 29.3 pg (ref 26.0–34.0)
MCHC: 30.7 g/dL (ref 30.0–36.0)
MCV: 95.6 fL (ref 78.0–100.0)
Platelets: 207 10*3/uL (ref 150–400)
RBC: 3.17 MIL/uL — ABNORMAL LOW (ref 4.22–5.81)
RDW: 16.5 % — ABNORMAL HIGH (ref 11.5–15.5)
WBC: 4.4 10*3/uL (ref 4.0–10.5)

## 2017-11-18 LAB — HEPARIN LEVEL (UNFRACTIONATED): HEPARIN UNFRACTIONATED: 0.37 [IU]/mL (ref 0.30–0.70)

## 2017-11-18 LAB — PROTIME-INR
INR: 1.21
Prothrombin Time: 15.2 seconds (ref 11.4–15.2)

## 2017-11-18 MED ORDER — PANTOPRAZOLE SODIUM 40 MG PO TBEC: 40.0000 mg | DELAYED_RELEASE_TABLET | Freq: Two times a day (BID) | ORAL | 0 refills | Status: DC

## 2017-11-18 MED ORDER — ENOXAPARIN SODIUM 80 MG/0.8ML ~~LOC~~ SOLN: 80.0000 mg | Freq: Two times a day (BID) | SUBCUTANEOUS | 0 refills | Status: DC

## 2017-11-18 MED ORDER — ENOXAPARIN SODIUM 80 MG/0.8ML ~~LOC~~ SOLN
80.0000 mg | Freq: Two times a day (BID) | SUBCUTANEOUS | Status: DC
  Administered 2017-11-18: 80 mg via SUBCUTANEOUS
  Filled 2017-11-18: qty 0.8

## 2017-11-18 MED ORDER — SUCRALFATE 1 G PO TABS: 1.0000 g | ORAL_TABLET | Freq: Three times a day (TID) | ORAL | 0 refills | Status: DC

## 2017-11-18 MED ORDER — FERROUS SULFATE 325 (65 FE) MG PO TABS: 325.0000 mg | ORAL_TABLET | ORAL | 0 refills | Status: DC

## 2017-11-18 MED ORDER — WARFARIN SODIUM 5 MG PO TABS: 5.0000 mg | ORAL_TABLET | Freq: Once | ORAL | Status: DC

## 2017-11-18 NOTE — Progress Notes (Signed)
Progress Note  Patient Name: Timothy ROOKE Sr. Date of Encounter: 11/18/2017  Primary Cardiologist: Rozann Lesches, MD   Subjective   Sitting on edge of bed, no bleeding, no orthopnea, no PND, no chest pain, no shortness of breath.  Eager to go home.  Lovenox box seen at bedside  Inpatient Medications    Scheduled Meds: . atorvastatin  10 mg Oral q1800  . cholecalciferol  1,000 Units Oral Daily  . enoxaparin (LOVENOX) injection  80 mg Subcutaneous Q12H  . ferrous sulfate  325 mg Oral QODAY  . metoprolol succinate  100 mg Oral BID  . omega-3 acid ethyl esters  1,000 mg Oral Daily  . pantoprazole  40 mg Oral BID AC  . sucralfate  1 g Oral TID WC & HS  . tamsulosin  0.4 mg Oral QHS  . vitamin C  500 mg Oral Daily  . warfarin  5 mg Oral ONCE-1800  . Warfarin - Pharmacist Dosing Inpatient   Does not apply q1800   Continuous Infusions: . sodium chloride Stopped (11/12/17 2156)   PRN Meds: sodium chloride, acetaminophen, meclizine, senna-docusate, traMADol   Vital Signs    Vitals:   11/17/17 2231 11/17/17 2233 11/18/17 0624 11/18/17 0824  BP: 99/62 101/72 102/64 105/64  Pulse: 74 79 80 80  Resp:   17   Temp:   97.9 F (36.6 C) 98.3 F (36.8 C)  TempSrc:   Oral Oral  SpO2: 97% 96% 95% 99%  Weight:   88.1 kg   Height:        Intake/Output Summary (Last 24 hours) at 11/18/2017 1240 Last data filed at 11/18/2017 1226 Gross per 24 hour  Intake 420 ml  Output 975 ml  Net -555 ml   Filed Weights   11/16/17 0500 11/17/17 0507 11/18/17 0624  Weight: 81.7 kg 88.6 kg 88.1 kg    Telemetry    Sinus rhythm- Personally Reviewed  ECG    Sinus rhythm- Personally Reviewed  Physical Exam   GEN: No acute distress.   Neck: No JVD Cardiac: RRR, sharp S2 click no murmurs, rubs, or gallops.  Respiratory: Clear to auscultation bilaterally. GI: Soft, nontender, non-distended  MS: No edema; No deformity. Neuro:  Nonfocal  Psych: Normal affect   Labs     Chemistry Recent Labs  Lab 11/12/17 0500 11/14/17 0439 11/15/17 0345 11/17/17 0511  NA 135 141 139 139  K 3.9 3.5 3.8 3.9  CL 107 108 107 109  CO2 21* 24 26 26   GLUCOSE 96 118* 100* 96  BUN 14 14 12 10   CREATININE 0.88 1.15 0.93 0.86  CALCIUM 7.6* 7.6* 8.1* 8.3*  PROT 4.6*  --   --   --   ALBUMIN 2.5*  --   --   --   AST 56*  --   --   --   ALT 46*  --   --   --   ALKPHOS 53  --   --   --   BILITOT 0.7  --   --   --   GFRNONAA >60 60* >60 >60  GFRAA >60 >60 >60 >60  ANIONGAP 7 9 6  4*     Hematology Recent Labs  Lab 11/16/17 0438 11/17/17 0511 11/18/17 0448  WBC 5.3 4.6 4.4  RBC 3.22* 3.24* 3.17*  HGB 9.4* 9.6* 9.3*  HCT 30.6* 30.9* 30.3*  MCV 95.0 95.4 95.6  MCH 29.2 29.6 29.3  MCHC 30.7 31.1 30.7  RDW 16.8* 16.5* 16.5*  PLT 211 209 207    Cardiac EnzymesNo results for input(s): TROPONINI in the last 168 hours. No results for input(s): TROPIPOC in the last 168 hours.   BNPNo results for input(s): BNP, PROBNP in the last 168 hours.   DDimer No results for input(s): DDIMER in the last 168 hours.   Radiology    No results found.  Cardiac Studies   Echocardiogram 11/06/2017:  - Left ventricle: The cavity size was normal. Systolic function was   mildly to moderately reduced. The estimated ejection fraction was   in the range of 40% to 45%. - Aortic valve: Mildly calcified annulus. Moderately thickened,   moderately calcified leaflets. There was moderate stenosis. Valve   area (VTI): 1.59 cm^2. Valve area (Vmax): 1.61 cm^2. Valve area   (Vmean): 1.34 cm^2. - Mitral valve: There was mild regurgitation. - Left atrium: The atrium was moderately dilated. - Right atrium: The atrium was mildly dilated. - Pulmonary arteries: Systolic pressure was mildly increased. PA   peak pressure: 34 mm Hg (S).  Patient Profile     77 y.o. male with mechanical aortic valve post Bentall procedure in 2002 with defibrillator 2014 GI bleed duodenal ulcers polyps and  diverticulosis.  Empiric embolization of GDA.  Assessment & Plan    Mechanical aortic valve - Goal INR 3. - Lovenox bridge has been initiated.  He knows to look out for any signs of bleeding. -He has his Coumadin checked at his family doctor at home and he has an appointment on Monday.  ICD -Stable.  GI bleed -Stable.  Hemoglobin stable.  To be discharged today.  [facility] will sign off.   Medication Recommendations: Lovenox, Coumadin Other recommendations (labs, testing, etc): Continue with close monitoring.  Current INR 1.2 Follow up as an outpatient: Coumadin clinic, checked by family doctor.  For questions or updates, please contact [place] Please consult [email or web address] for contact info under        Signed, Candee Furbish, MD  11/18/2017, 12:40 PM

## 2017-11-18 NOTE — Progress Notes (Signed)
ANTICOAGULATION CONSULT NOTE - Follow Up Consult  Pharmacy Consult for Heparin, Coumadimn Indication: Mechanical valve + afib  No Known Allergies  Patient Measurements: Height: 5\' 8"  (172.7 cm) Weight: 194 lb 4.8 oz (88.1 kg) IBW/kg (Calculated) : 68.4 Heparin Dosing Weight:  82 kg  Vital Signs: Temp: 98.3 F (36.8 C) (10/05 0824) Temp Source: Oral (10/05 0824) BP: 105/64 (10/05 0824) Pulse Rate: 80 (10/05 0824)  Labs: Recent Labs    11/16/17 0438 11/17/17 0511 11/18/17 0448  HGB 9.4* 9.6* 9.3*  HCT 30.6* 30.9* 30.3*  PLT 211 209 207  LABPROT 14.3 14.4 15.2  INR 1.12 1.12 1.21  HEPARINUNFRC 0.39 0.47 0.37  CREATININE  --  0.86  --     Estimated Creatinine Clearance: 77.6 mL/min (by C-G formula based on SCr of 0.86 mg/dL).   Assessment:  Anticoag: Coumadin PTA, now Heparin and warfarin for mech AVR and afib. Significant GIB requiring embolization on 9/29. Heparin level 0.37. INR increased to 1.21 this AM. Hgb 9.3 stable. Plts 207 stable. Nursing confirms no signs/symptoms of bleeding.   - S/p FFP and 5mg  vit k (5mg  IV 9/24, 5mg  po 9/27, 5mg  IV 9/28) - PTA Warfarin dose 5 mg/day. INR goal is 2-3 per MD notes, latest attending note indicates desired INR 3 per cards note Admit INR 8.35 due to Bactrim use.  Patient is discharging today on enoxaparin bridge. Dicussed with nurse timing of enoxaparin once heparin drip stopped.   Goal of Therapy:  INR 3  Monitor platelets by anticoagulation protocol: Yes   Plan:  Discontinue heparin 1250 units/hr Start enoxaparin 80mg  (~1mg /kg) BID; administer first dose 1 hour after heparin drip stopped  Coumadin 5mg  po x 1 tonight Daily HL, CBC, and INR  Isaias Sakai, Pharm D PGY1 Pharmacy Resident  Phone [phone] Please use AMION for clinical pharmacists numbers  11/18/2017      8:27 AM

## 2017-11-18 NOTE — Discharge Summary (Signed)
Discharge Summary  Timothy EADDY Sr. ENI:[id number] DOB: 06/25/1940  PCP: [name], MD  Admit date: 11/05/2017 Discharge date: 11/18/2017  Time spent: 35 mins  Recommendations for Outpatient Follow-up:  1. PCP 2. GI 3. Cardiology  Discharge Diagnoses:  Active Hospital Problems   Diagnosis Date Noted  . GI bleed 11/05/2017  . Supratherapeutic INR 11/05/2017  . Acute blood loss anemia 11/05/2017  . ICD (implantable cardioverter-defibrillator) in place 02/19/2014  . Chronic combined systolic and diastolic heart failure ([facility]) 07/02/2012  . S/P aortic valve replacement with metallic valve 61/44/3154  . Essential hypertension, benign 10/08/2008  . Atrial fibrillation with rapid ventricular response ([facility]) -status post cardioversion 04/02/2008    Resolved Hospital Problems  No resolved problems to display.    Discharge Condition: Stable  Diet recommendation: Heart healthy  Vitals:   11/18/17 0624 11/18/17 0824  BP: 102/64 105/64  Pulse: 80 80  Resp: 17   Temp: 97.9 F (36.6 C) 98.3 F (36.8 C)  SpO2: 95% 99%    History of present illness:  Patient is a 77 year old male with past medical history of atrial fibrillation, mechanical aortic valve on warfarin, nonischemic cardiomyopathy status post AICD presented to the hospital with complaints of melena. EGD and colonoscopy were performed on 11/08/2017. EGDshowed 3 large nonbleeding ulcers with discoloration at the bases.Patientalso underwent embolization of the gastroduodenal artery on 11/11/2017.Colonoscopy showed 2 polyps that were removed. Patient has received a total of 5 units of PRBC. GI, cardiology following- anticoagulation restarted.   Today, pt denied any new complaints. No signs of bleeding noted, hgb remained stable. Pt will be discharged on warfarin, bridging with lovenox till INR goal of 3. Pt strongly advised to set an appointment with PCP for close monitoring of INR and CBC.  Hospital Course:    Principal Problem:   GI bleed Active Problems:   Essential hypertension, benign   Atrial fibrillation with rapid ventricular response ([facility]) -status post cardioversion   S/P aortic valve replacement with metallic valve   Chronic combined systolic and diastolic heart failure ([facility])   ICD (implantable cardioverter-defibrillator) in place   Supratherapeutic INR   Acute blood loss anemia  Upper GI bleed with acute blood loss anemia Stable Likely due to duodenal ulcers in conjunction with warfarin Status post transfusion of 5U of PRBC this admission GI on board: Status post EGD which showed 3 large nonbleeding ulcers, colonoscopy status post removal of 2 polyps, and embolization of the gastroduodenal artery Cardiology on board for management of anticoagulation as patient has a mechanical aortic valve Discharge on home coumadin, to bridge with lovenox (Pt was able to successfully give himself lovenox here in the hospital, has previously done it before) Continue PPI, sucralfate Follow up with PCP for close monitoring of CBC and INR as well as GI for monitoring ulcer healing  Paroxysmal A. Fib Rate controlled Continue metoprolol, coumadin  Chronic combined systolic/diastolic HF Appears euvolemic Echo done on 11/06/2017 showed EF of 40 to 45% Continue home dose of lasix  Hypertension Stable, on the soft side Held home losartan, continue metoprolol  Mechanical aortic valve replacement Continue coumadin, bridge with enoxaparin  Goal INR 3 Advised close follow up with PCP for CBC and INR     Procedures:  Mesenteric angiogram embolization of gastroduodenal artery on 9/27  EGD/colonoscopy on 9/25  Consultations:  GI  Cardiology   Discharge Exam: BP 105/64 (BP Location: Right Arm)   Pulse 80   Temp 98.3 F (36.8 C) (Oral)  Resp 17   Ht 5\' 8"  (1.727 m)   Wt 88.1 kg   SpO2 99%   BMI 29.54 kg/m   General: NAD Cardiovascular: S1, S2 present Respiratory: CTAB    Discharge Instructions You were cared for by a hospitalist during your hospital stay. If you have any questions about your discharge medications or the care you received while you were in the hospital after you are discharged, you can call the unit and asked to speak with the hospitalist on call if the hospitalist that took care of you is not available. Once you are discharged, your primary care physician will handle any further medical issues. Please note that NO REFILLS for any discharge medications will be authorized once you are discharged, as it is imperative that you return to your primary care physician (or establish a relationship with a primary care physician if you do not have one) for your aftercare needs so that they can reassess your need for medications and monitor your lab values.   Allergies as of 11/18/2017   No Known Allergies     Medication List    STOP taking these medications   losartan 50 MG tablet Commonly known as:  COZAAR   sulfamethoxazole-trimethoprim 800-160 MG tablet Commonly known as:  BACTRIM DS,SEPTRA DS     TAKE these medications   acetaminophen 500 MG tablet Commonly known as:  TYLENOL Take 1,000 mg by mouth every 6 (six) hours as needed for headache (pain).   atorvastatin 10 MG tablet Commonly known as:  LIPITOR Take 10 mg by mouth daily.   cholecalciferol 1000 units tablet Commonly known as:  VITAMIN D Take 1,000 Units by mouth daily.   enoxaparin 80 MG/0.8ML injection Commonly known as:  LOVENOX Inject 0.8 mLs (80 mg total) into the skin every 12 (twelve) hours for 14 days.   ferrous sulfate 325 (65 FE) MG tablet Take 1 tablet (325 mg total) by mouth every other day. Start taking on:  11/19/2017   Fish Oil 1000 MG Caps Take 1,000 mg by mouth daily.   FLOMAX 0.4 MG Caps capsule Generic drug:  tamsulosin Take 0.4 mg by mouth at bedtime.   furosemide 20 MG tablet Commonly known as:  LASIX Take 20 mg by mouth daily.   meclizine 25  MG tablet Commonly known as:  ANTIVERT Take 25 mg by mouth 3 (three) times daily as needed for dizziness.   metoprolol succinate 100 MG 24 hr tablet Commonly known as:  TOPROL-XL TAKE 1 AND 1/2 TABLETS BY MOUTH TWICE DAILY   metroNIDAZOLE 0.75 % cream Commonly known as:  METROCREAM Apply 1 application topically daily as needed (rosacea).   oxymetazoline 0.05 % nasal spray Commonly known as:  AFRIN Place 1 spray into both nostrils daily as needed for congestion.   pantoprazole 40 MG tablet Commonly known as:  PROTONIX Take 1 tablet (40 mg total) by mouth 2 (two) times daily before a meal.   phenylephrine 1 % nasal spray Commonly known as:  NEO-SYNEPHRINE Place 1 drop into both nostrils daily as needed for congestion.   sucralfate 1 g tablet Commonly known as:  CARAFATE Take 1 tablet (1 g total) by mouth 4 (four) times daily -  with meals and at bedtime.   vitamin C 500 MG tablet Commonly known as:  ASCORBIC ACID Take 500 mg by mouth daily.   warfarin 5 MG tablet Commonly known as:  COUMADIN Take 5 mg by mouth at bedtime.      No  Known Allergies Follow-up Information    [name], MD. Schedule an appointment as soon as possible for a visit in 1 week(s).   Specialty:  Internal Medicine Contact information: [place] [address] [phone]        [name], MD .   Specialty:  Cardiology Contact information: [place] [address] [phone]        Satira Sark, MD .   Specialty:  Cardiology Contact information: [place] [address] [phone]        Ronnette Juniper, MD. Schedule an appointment as soon as possible for a visit in 6 week(s).   Specialty:  Gastroenterology Contact information: [place] [address] [phone]            The results of significant diagnostics from this hospitalization (including imaging, microbiology, ancillary and  laboratory) are listed below for reference.    Significant Diagnostic Studies: Nm Gi Blood Loss  Result Date: 11/10/2017 CLINICAL DATA:  Melena/hematochezia this morning. Recent endoscopy demonstrated antral ulcers and 3 large duodenal bulbs ulcers. Patient is on anticoagulation for mechanical valve and atrial fibrillation. EXAM: NUCLEAR MEDICINE GASTROINTESTINAL BLEEDING SCAN TECHNIQUE: Sequential abdominal images were obtained following intravenous administration of Tc-43m labeled red blood cells. RADIOPHARMACEUTICALS:  26.3 mCi Tc-73m pertechnetate in-vitro labeled red cells. COMPARISON:  None. FINDINGS: There is a small focus abnormal radiotracer activity which appears to which to originate within the proximal small bowel. The overall amount of abnormal radiotracer activity is very small suggesting a small, transient bleed. Physiologic activity is seen within the heart, liver, urinary bladder, abdominal aorta and pelvic vasculature. IMPRESSION: Suspected transient proximal small bowel bleed. Critical Value/emergent results were called by telephone at the time of interpretation on 11/10/2017 at 1636 to Dr. Ronnette Juniper , who verbally acknowledged these results. Electronically Signed   By: Sandi Mariscal M.D.   On: 11/10/2017 16:35   Ir Angiogram Visceral Selective  Result Date: 11/11/2017 INDICATION: Gastrointestinal bleeding EXAM: MESENTERIC ANGIOGRAM AND GASTRODUODENAL ARTERY EMBOLIZATION. MEDICATIONS: NONE ANESTHESIA/SEDATION: Moderate (conscious) sedation was employed during this procedure. A total of Versed 1 mg and Fentanyl 50 mcg was administered intravenously. Moderate Sedation Time: 76 minutes. The patient's level of consciousness and vital signs were monitored continuously by radiology nursing throughout the procedure under my direct supervision. CONTRAST:  170 CC ISOVUE-300 FLUOROSCOPY TIME:  Fluoroscopy Time: 13 minutes 30 seconds (815 mGy). COMPLICATIONS: None immediate. PROCEDURE: Informed  consent was obtained from the patient following explanation of the procedure, risks, benefits and alternatives. The patient understands, agrees and consents for the procedure. All questions were addressed. A time out was performed prior to the initiation of the procedure. Maximal barrier sterile technique utilized including caps, mask, sterile gowns, sterile gloves, large sterile drape, hand hygiene, and Betadine prep. The right groin was prepped and draped in a sterile fashion. 1% lidocaine was utilized for local anesthesia. Under sonographic guidance, a micropuncture needle was inserted into the right common femoral artery and removed over a 018 wire. This was up sized to a Bentson. The common femoral artery was noted to be patent. Sonographic documentation was obtained A 5 French sheath was inserted. A 5 French pigtail catheter was advanced into the aorta. Lateral abdominal aortography was performed for the origins of the celiac and SMA. The catheter was retracted. RA 0 distal abdominal aortography was performed for the origin of the IMA. The pigtail was exchanged for  a cobra 2 catheter. The celiac was selected. Angiography was performed. This was advanced over a glidewire into the common hepatic artery. Angiography was performed. A Renegade microcatheter was advanced over a 016 fathom wire into the gastroduodenal artery and angiography was performed. The catheter was then advanced over the wire into the distal gastroduodenal artery. Embolization was performed utilizing 3 mm and 4 mm diameter interlock coils for total embolization of the gastroduodenal artery. The microcatheter was retracted into the common hepatic artery and repeat angiography was performed. The Cobra 2 catheter was aspirated and flushed. It was retracted into the abdominal aorta. It was advanced over a Bentson wire into the SMA and angiography was performed. It was then retracted and advanced over a Bentson wire into the IMA and angiography was  performed. The catheter was then advanced into the right external iliac artery and right RA 0 femoral angiography was performed. The sheath was flushed and sewn in place.  The patient's INR is 2.1. FINDINGS: Lateral abdominal aortography demonstrates patency of the celiac and SMA. RA 0 distal abdominal aortography demonstrates patency of the origin of the IMA. Celiac angiography delineates anatomy. There is no evidence of active bleeding within the territory of the gastroduodenal artery or left gastric artery Selective angiography of the common hepatic artery demonstrates patency of the gastroduodenal artery. Subsequent images demonstrate coil embolization of the gastroduodenal artery. 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 Injection of the SMA demonstrates patency of the branches but no source of active bleeding. Angiography of the IMA over the pelvis and left upper quadrant demonstrates no evidence of bleeding from the descending or sigmoid colon. IMPRESSION: Angiography of the celiac, SMA, and IMA demonstrates no active source of bleeding. Empiric embolization of the gastroduodenal artery was performed. The right femoral artery sheath was sewn in place in preparation for sheath removal after the INR is normalized. Electronically Signed   By: Marybelle Killings M.D.   On: 11/11/2017 14:28   Ir Angiogram Visceral Selective  Result Date: 11/11/2017 INDICATION: Gastrointestinal bleeding EXAM: MESENTERIC ANGIOGRAM AND GASTRODUODENAL ARTERY EMBOLIZATION. MEDICATIONS: NONE ANESTHESIA/SEDATION: Moderate (conscious) sedation was employed during this procedure. A total of Versed 1 mg and Fentanyl 50 mcg was administered intravenously. Moderate Sedation Time: 76 minutes. The patient's level of consciousness and vital signs were monitored continuously by radiology nursing throughout the procedure under my direct supervision. CONTRAST:  170 CC ISOVUE-300  FLUOROSCOPY TIME:  Fluoroscopy Time: 13 minutes 30 seconds (815 mGy). COMPLICATIONS: None immediate. PROCEDURE: Informed consent was obtained from the patient following explanation of the procedure, risks, benefits and alternatives. The patient understands, agrees and consents for the procedure. All questions were addressed. A time out was performed prior to the initiation of the procedure. Maximal barrier sterile technique utilized including caps, mask, sterile gowns, sterile gloves, large sterile drape, hand hygiene, and Betadine prep. The right groin was prepped and draped in a sterile fashion. 1% lidocaine was utilized for local anesthesia. Under sonographic guidance, a micropuncture needle was inserted into the right common femoral artery and removed over a 018 wire. This was up sized to a Bentson. The common femoral artery was noted to be patent. Sonographic documentation was obtained A 5 French sheath was inserted. A 5 French pigtail catheter was advanced into the aorta. Lateral abdominal aortography was performed for the origins of the celiac and SMA. The catheter was retracted. RA 0 distal abdominal aortography was performed for the origin of the IMA. The pigtail  was exchanged for a cobra 2 catheter. The celiac was selected. Angiography was performed. This was advanced over a glidewire into the common hepatic artery. Angiography was performed. A Renegade microcatheter was advanced over a 016 fathom wire into the gastroduodenal artery and angiography was performed. The catheter was then advanced over the wire into the distal gastroduodenal artery. Embolization was performed utilizing 3 mm and 4 mm diameter interlock coils for total embolization of the gastroduodenal artery. The microcatheter was retracted into the common hepatic artery and repeat angiography was performed. The Cobra 2 catheter was aspirated and flushed. It was retracted into the abdominal aorta. It was advanced over a Bentson wire into the  SMA and angiography was performed. It was then retracted and advanced over a Bentson wire into the IMA and angiography was performed. The catheter was then advanced into the right external iliac artery and right RA 0 femoral angiography was performed. The sheath was flushed and sewn in place.  The patient's INR is 2.1. FINDINGS: Lateral abdominal aortography demonstrates patency of the celiac and SMA. RA 0 distal abdominal aortography demonstrates patency of the origin of the IMA. Celiac angiography delineates anatomy. There is no evidence of active bleeding within the territory of the gastroduodenal artery or left gastric artery Selective angiography of the common hepatic artery demonstrates patency of the gastroduodenal artery. Subsequent images demonstrate coil embolization of the gastroduodenal artery. 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 Injection of the SMA demonstrates patency of the branches but no source of active bleeding. Angiography of the IMA over the pelvis and left upper quadrant demonstrates no evidence of bleeding from the descending or sigmoid colon. IMPRESSION: Angiography of the celiac, SMA, and IMA demonstrates no active source of bleeding. Empiric embolization of the gastroduodenal artery was performed. The right femoral artery sheath was sewn in place in preparation for sheath removal after the INR is normalized. Electronically Signed   By: Marybelle Killings M.D.   On: 11/11/2017 14:28   Ir Angiogram Visceral Selective  Result Date: 11/11/2017 INDICATION: Gastrointestinal bleeding EXAM: MESENTERIC ANGIOGRAM AND GASTRODUODENAL ARTERY EMBOLIZATION. MEDICATIONS: NONE ANESTHESIA/SEDATION: Moderate (conscious) sedation was employed during this procedure. A total of Versed 1 mg and Fentanyl 50 mcg was administered intravenously. Moderate Sedation Time: 76 minutes. The patient's level of consciousness and vital signs were monitored  continuously by radiology nursing throughout the procedure under my direct supervision. CONTRAST:  170 CC ISOVUE-300 FLUOROSCOPY TIME:  Fluoroscopy Time: 13 minutes 30 seconds (815 mGy). COMPLICATIONS: None immediate. PROCEDURE: Informed consent was obtained from the patient following explanation of the procedure, risks, benefits and alternatives. The patient understands, agrees and consents for the procedure. All questions were addressed. A time out was performed prior to the initiation of the procedure. Maximal barrier sterile technique utilized including caps, mask, sterile gowns, sterile gloves, large sterile drape, hand hygiene, and Betadine prep. The right groin was prepped and draped in a sterile fashion. 1% lidocaine was utilized for local anesthesia. Under sonographic guidance, a micropuncture needle was inserted into the right common femoral artery and removed over a 018 wire. This was up sized to a Bentson. The common femoral artery was noted to be patent. Sonographic documentation was obtained A 5 French sheath was inserted. A 5 French pigtail catheter was advanced into the aorta. Lateral abdominal aortography was performed for the origins of the celiac and SMA. The catheter was retracted. RA 0 distal abdominal aortography was performed for the origin of the  IMA. The pigtail was exchanged for a cobra 2 catheter. The celiac was selected. Angiography was performed. This was advanced over a glidewire into the common hepatic artery. Angiography was performed. A Renegade microcatheter was advanced over a 016 fathom wire into the gastroduodenal artery and angiography was performed. The catheter was then advanced over the wire into the distal gastroduodenal artery. Embolization was performed utilizing 3 mm and 4 mm diameter interlock coils for total embolization of the gastroduodenal artery. The microcatheter was retracted into the common hepatic artery and repeat angiography was performed. The Cobra 2 catheter  was aspirated and flushed. It was retracted into the abdominal aorta. It was advanced over a Bentson wire into the SMA and angiography was performed. It was then retracted and advanced over a Bentson wire into the IMA and angiography was performed. The catheter was then advanced into the right external iliac artery and right RA 0 femoral angiography was performed. The sheath was flushed and sewn in place.  The patient's INR is 2.1. FINDINGS: Lateral abdominal aortography demonstrates patency of the celiac and SMA. RA 0 distal abdominal aortography demonstrates patency of the origin of the IMA. Celiac angiography delineates anatomy. There is no evidence of active bleeding within the territory of the gastroduodenal artery or left gastric artery Selective angiography of the common hepatic artery demonstrates patency of the gastroduodenal artery. Subsequent images demonstrate coil embolization of the gastroduodenal artery. 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 Injection of the SMA demonstrates patency of the branches but no source of active bleeding. Angiography of the IMA over the pelvis and left upper quadrant demonstrates no evidence of bleeding from the descending or sigmoid colon. IMPRESSION: Angiography of the celiac, SMA, and IMA demonstrates no active source of bleeding. Empiric embolization of the gastroduodenal artery was performed. The right femoral artery sheath was sewn in place in preparation for sheath removal after the INR is normalized. Electronically Signed   By: Marybelle Killings M.D.   On: 11/11/2017 14:28   Ir Angiogram Selective Each Additional Vessel  Result Date: 11/11/2017 INDICATION: Gastrointestinal bleeding EXAM: MESENTERIC ANGIOGRAM AND GASTRODUODENAL ARTERY EMBOLIZATION. MEDICATIONS: NONE ANESTHESIA/SEDATION: Moderate (conscious) sedation was employed during this procedure. A total of Versed 1 mg and Fentanyl 50 mcg was  administered intravenously. Moderate Sedation Time: 76 minutes. The patient's level of consciousness and vital signs were monitored continuously by radiology nursing throughout the procedure under my direct supervision. CONTRAST:  170 CC ISOVUE-300 FLUOROSCOPY TIME:  Fluoroscopy Time: 13 minutes 30 seconds (815 mGy). COMPLICATIONS: None immediate. PROCEDURE: Informed consent was obtained from the patient following explanation of the procedure, risks, benefits and alternatives. The patient understands, agrees and consents for the procedure. All questions were addressed. A time out was performed prior to the initiation of the procedure. Maximal barrier sterile technique utilized including caps, mask, sterile gowns, sterile gloves, large sterile drape, hand hygiene, and Betadine prep. The right groin was prepped and draped in a sterile fashion. 1% lidocaine was utilized for local anesthesia. Under sonographic guidance, a micropuncture needle was inserted into the right common femoral artery and removed over a 018 wire. This was up sized to a Bentson. The common femoral artery was noted to be patent. Sonographic documentation was obtained A 5 French sheath was inserted. A 5 French pigtail catheter was advanced into the aorta. Lateral abdominal aortography was performed for the origins of the celiac and SMA. The catheter was retracted. RA 0 distal abdominal aortography was performed  for the origin of the IMA. The pigtail was exchanged for a cobra 2 catheter. The celiac was selected. Angiography was performed. This was advanced over a glidewire into the common hepatic artery. Angiography was performed. A Renegade microcatheter was advanced over a 016 fathom wire into the gastroduodenal artery and angiography was performed. The catheter was then advanced over the wire into the distal gastroduodenal artery. Embolization was performed utilizing 3 mm and 4 mm diameter interlock coils for total embolization of the  gastroduodenal artery. The microcatheter was retracted into the common hepatic artery and repeat angiography was performed. The Cobra 2 catheter was aspirated and flushed. It was retracted into the abdominal aorta. It was advanced over a Bentson wire into the SMA and angiography was performed. It was then retracted and advanced over a Bentson wire into the IMA and angiography was performed. The catheter was then advanced into the right external iliac artery and right RA 0 femoral angiography was performed. The sheath was flushed and sewn in place.  The patient's INR is 2.1. FINDINGS: Lateral abdominal aortography demonstrates patency of the celiac and SMA. RA 0 distal abdominal aortography demonstrates patency of the origin of the IMA. Celiac angiography delineates anatomy. There is no evidence of active bleeding within the territory of the gastroduodenal artery or left gastric artery Selective angiography of the common hepatic artery demonstrates patency of the gastroduodenal artery. Subsequent images demonstrate coil embolization of the gastroduodenal artery. 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 Injection of the SMA demonstrates patency of the branches but no source of active bleeding. Angiography of the IMA over the pelvis and left upper quadrant demonstrates no evidence of bleeding from the descending or sigmoid colon. IMPRESSION: Angiography of the celiac, SMA, and IMA demonstrates no active source of bleeding. Empiric embolization of the gastroduodenal artery was performed. The right femoral artery sheath was sewn in place in preparation for sheath removal after the INR is normalized. Electronically Signed   By: Marybelle Killings M.D.   On: 11/11/2017 14:28   Ir US Guide Vasc Access Right  Result Date: 11/11/2017 INDICATION: Gastrointestinal bleeding EXAM: MESENTERIC ANGIOGRAM AND GASTRODUODENAL ARTERY EMBOLIZATION. MEDICATIONS: NONE  ANESTHESIA/SEDATION: Moderate (conscious) sedation was employed during this procedure. A total of Versed 1 mg and Fentanyl 50 mcg was administered intravenously. Moderate Sedation Time: 76 minutes. The patient's level of consciousness and vital signs were monitored continuously by radiology nursing throughout the procedure under my direct supervision. CONTRAST:  170 CC ISOVUE-300 FLUOROSCOPY TIME:  Fluoroscopy Time: 13 minutes 30 seconds (815 mGy). COMPLICATIONS: None immediate. PROCEDURE: Informed consent was obtained from the patient following explanation of the procedure, risks, benefits and alternatives. The patient understands, agrees and consents for the procedure. All questions were addressed. A time out was performed prior to the initiation of the procedure. Maximal barrier sterile technique utilized including caps, mask, sterile gowns, sterile gloves, large sterile drape, hand hygiene, and Betadine prep. The right groin was prepped and draped in a sterile fashion. 1% lidocaine was utilized for local anesthesia. Under sonographic guidance, a micropuncture needle was inserted into the right common femoral artery and removed over a 018 wire. This was up sized to a Bentson. The common femoral artery was noted to be patent. Sonographic documentation was obtained A 5 French sheath was inserted. A 5 French pigtail catheter was advanced into the aorta. Lateral abdominal aortography was performed for the origins of the celiac and SMA. The catheter was retracted. RA 0  distal abdominal aortography was performed for the origin of the IMA. The pigtail was exchanged for a cobra 2 catheter. The celiac was selected. Angiography was performed. This was advanced over a glidewire into the common hepatic artery. Angiography was performed. A Renegade microcatheter was advanced over a 016 fathom wire into the gastroduodenal artery and angiography was performed. The catheter was then advanced over the wire into the distal  gastroduodenal artery. Embolization was performed utilizing 3 mm and 4 mm diameter interlock coils for total embolization of the gastroduodenal artery. The microcatheter was retracted into the common hepatic artery and repeat angiography was performed. The Cobra 2 catheter was aspirated and flushed. It was retracted into the abdominal aorta. It was advanced over a Bentson wire into the SMA and angiography was performed. It was then retracted and advanced over a Bentson wire into the IMA and angiography was performed. The catheter was then advanced into the right external iliac artery and right RA 0 femoral angiography was performed. The sheath was flushed and sewn in place.  The patient's INR is 2.1. FINDINGS: Lateral abdominal aortography demonstrates patency of the celiac and SMA. RA 0 distal abdominal aortography demonstrates patency of the origin of the IMA. Celiac angiography delineates anatomy. There is no evidence of active bleeding within the territory of the gastroduodenal artery or left gastric artery Selective angiography of the common hepatic artery demonstrates patency of the gastroduodenal artery. Subsequent images demonstrate coil embolization of the gastroduodenal artery. 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 Injection of the SMA demonstrates patency of the branches but no source of active bleeding. Angiography of the IMA over the pelvis and left upper quadrant demonstrates no evidence of bleeding from the descending or sigmoid colon. IMPRESSION: Angiography of the celiac, SMA, and IMA demonstrates no active source of bleeding. Empiric embolization of the gastroduodenal artery was performed. The right femoral artery sheath was sewn in place in preparation for sheath removal after the INR is normalized. Electronically Signed   By: Marybelle Killings M.D.   On: 11/11/2017 14:28   [place] Guide  Roadmapping  Result Date: 11/13/2017 INDICATION: Gastrointestinal bleeding  EXAM: MESENTERIC ANGIOGRAM AND GASTRODUODENAL ARTERY EMBOLIZATION.  MEDICATIONS: NONE  ANESTHESIA/SEDATION: Moderate (conscious) sedation was employed during this procedure. A total of Versed 1 mg and Fentanyl 50 mcg was administered intravenously.  Moderate Sedation Time: 76 minutes. The patient's level of consciousness and vital signs were monitored continuously by radiology nursing throughout the procedure under my direct supervision.  CONTRAST:  170 CC ISOVUE-300  FLUOROSCOPY TIME:  Fluoroscopy Time: 13 minutes 30 seconds (815 mGy).  COMPLICATIONS: None immediate.  PROCEDURE: Informed consent was obtained from the patient following explanation of the procedure, risks, benefits and alternatives. The patient understands, agrees and consents for the procedure. All questions were addressed. A time out was performed prior to the initiation of the procedure. Maximal barrier sterile technique utilized including caps, mask, sterile gowns, sterile gloves, large sterile drape, hand hygiene, and Betadine prep.  The right groin was prepped and draped in a sterile fashion. 1% lidocaine was utilized for local anesthesia. Under sonographic guidance, a micropuncture needle was inserted into the right common femoral artery and removed over a 018 wire. This was up sized to a Bentson. The common femoral artery was noted to be patent. Sonographic documentation was obtained  A 5 French sheath was inserted. A 5 French pigtail catheter was advanced into  the aorta. Lateral abdominal aortography was performed for the origins of the celiac and SMA. The catheter was retracted. RA 0 distal abdominal aortography was performed for the origin of the IMA.  The pigtail was exchanged for a cobra 2 catheter. The celiac was selected. Angiography was performed. This was advanced over a glidewire into the common hepatic artery. Angiography was performed. A  Renegade microcatheter was advanced over a 016 fathom wire into the gastroduodenal artery and angiography was performed. The catheter was then advanced over the wire into the distal gastroduodenal artery. Embolization was performed utilizing 3 mm and 4 mm diameter interlock coils for total embolization of the gastroduodenal artery. The microcatheter was retracted into the common hepatic artery and repeat angiography was performed.  The Cobra 2 catheter was aspirated and flushed. It was retracted into the abdominal aorta. It was advanced over a Bentson wire into the SMA and angiography was performed.  It was then retracted and advanced over a Bentson wire into the IMA and angiography was performed.  The catheter was then advanced into the right external iliac artery and right RA 0 femoral angiography was performed.  The sheath was flushed and sewn in place.  The patient's INR is 2.1.  FINDINGS: Lateral abdominal aortography demonstrates patency of the celiac and SMA.  RA 0 distal abdominal aortography demonstrates patency of the origin of the IMA.  Celiac angiography delineates anatomy. There is no evidence of active bleeding within the territory of the gastroduodenal artery or left gastric artery  Selective angiography of the common hepatic artery demonstrates patency of the gastroduodenal artery.  Subsequent images demonstrate coil embolization of the gastroduodenal artery. 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  Injection of the SMA demonstrates patency of the branches but no source of active bleeding.  Angiography of the IMA over the pelvis and left upper quadrant demonstrates no evidence of bleeding from the descending or sigmoid colon.  IMPRESSION: Angiography of the celiac, SMA, and IMA demonstrates no active source of bleeding.  Empiric embolization of the gastroduodenal artery was performed.  The right femoral artery sheath was  sewn in place in preparation for sheath removal after the INR is normalized.   Electronically Signed   By: Marybelle Killings M.D.   On: 11/11/2017 14:28   Microbiology: Recent Results (from the past 240 hour(s))  Culture, Urine     Status: None   Collection Time: 11/10/17  9:59 PM  Result Value Ref Range Status   Specimen Description URINE, CATHETERIZED  Final   Special Requests NONE  Final   Culture   Final    NO GROWTH Performed at [facility], [address] [place], [place] [address]    Report Status 11/12/2017 FINAL  Final  Culture, blood (routine x 2)     Status: None   Collection Time: 11/10/17 11:12 PM  Result Value Ref Range Status   Specimen Description BLOOD RIGHT ANTECUBITAL  Final   Special Requests   Final    BOTTLES DRAWN AEROBIC AND ANAEROBIC Blood Culture adequate volume   Culture   Final    NO GROWTH 5 DAYS Performed at [facility], [place] [address] [place],  [address]    Report Status 11/16/2017 FINAL  Final  Culture, blood (routine x 2)     Status: None   Collection Time: 11/10/17 11:19 PM  Result Value Ref Range Status   Specimen Description BLOOD RIGHT HAND  Final  Special Requests   Final    BOTTLES DRAWN AEROBIC AND ANAEROBIC Blood Culture adequate volume   Culture   Final    NO GROWTH 5 DAYS Performed at [facility], [place] [address] [place], [place] [address]    Report Status 11/16/2017 FINAL  Final     Labs: Basic Metabolic Panel: Recent Labs  Lab 11/12/17 0500 11/14/17 0439 11/15/17 0345 11/17/17 0511  NA 135 141 139 139  K 3.9 3.5 3.8 3.9  CL 107 108 107 109  CO2 21* 24 26 26   GLUCOSE 96 118* 100* 96  BUN 14 14 12 10   CREATININE 0.88 1.15 0.93 0.86  CALCIUM 7.6* 7.6* 8.1* 8.3*   Liver Function Tests: Recent Labs  Lab 11/12/17 0500  AST 56*  ALT 46*  ALKPHOS 53  BILITOT 0.7  PROT 4.6*  ALBUMIN 2.5*   No results for input(s): LIPASE, AMYLASE in the last 168 hours. No results for input(s): AMMONIA  in the last 168 hours. CBC: Recent Labs  Lab 11/14/17 0439 11/15/17 0345 11/16/17 0438 11/17/17 0511 11/18/17 0448  WBC 6.3 5.8 5.3 4.6 4.4  HGB 9.0* 8.9* 9.4* 9.6* 9.3*  HCT 28.7* 28.4* 30.6* 30.9* 30.3*  MCV 92.9 93.4 95.0 95.4 95.6  PLT 206 217 211 209 207   Cardiac Enzymes: No results for input(s): CKTOTAL, CKMB, CKMBINDEX, TROPONINI in the last 168 hours. BNP: BNP (last 3 results) No results for input(s): BNP in the last 8760 hours.  ProBNP (last 3 results) No results for input(s): PROBNP in the last 8760 hours.  CBG: No results for input(s): GLUCAP in the last 168 hours.     Signed:  Alma Friendly, MD [facility] 11/18/2017, 12:10 PM

## 2017-11-18 NOTE — Progress Notes (Signed)
Med-Surg orders placed by MD. Evelina Bucy patient off of bedside monitor. Notified Telemetry.

## 2017-11-19 ENCOUNTER — Other Ambulatory Visit: Payer: Self-pay

## 2017-11-19 ENCOUNTER — Encounter (HOSPITAL_COMMUNITY): Payer: Self-pay | Admitting: *Deleted

## 2017-11-19 ENCOUNTER — Emergency Department (HOSPITAL_COMMUNITY)
Admission: EM | Admit: 2017-11-19 | Discharge: 2017-11-19 | Disposition: A | Discharge status: Home / Self Care | Payer: Medicare HMO | Attending: Emergency Medicine | Admitting: Emergency Medicine

## 2017-11-19 DIAGNOSIS — I10 Essential (primary) hypertension: Secondary | ICD-10-CM | POA: Insufficient documentation

## 2017-11-19 DIAGNOSIS — I509 Heart failure, unspecified: Secondary | ICD-10-CM | POA: Insufficient documentation

## 2017-11-19 DIAGNOSIS — M79601 Pain in right arm: Secondary | ICD-10-CM | POA: Insufficient documentation

## 2017-11-19 DIAGNOSIS — Z79899 Other long term (current) drug therapy: Secondary | ICD-10-CM | POA: Diagnosis not present

## 2017-11-19 DIAGNOSIS — Z87891 Personal history of nicotine dependence: Secondary | ICD-10-CM | POA: Diagnosis not present

## 2017-11-19 DIAGNOSIS — K625 Hemorrhage of anus and rectum: Secondary | ICD-10-CM

## 2017-11-19 DIAGNOSIS — I4891 Unspecified atrial fibrillation: Secondary | ICD-10-CM | POA: Insufficient documentation

## 2017-11-19 LAB — CBC
HEMATOCRIT: 34.3 % — AB (ref 39.0–52.0)
HEMOGLOBIN: 10.3 g/dL — AB (ref 13.0–17.0)
MCH: 28.9 pg (ref 26.0–34.0)
MCHC: 30 g/dL (ref 30.0–36.0)
MCV: 96.3 fL (ref 78.0–100.0)
Platelets: 206 10*3/uL (ref 150–400)
RBC: 3.56 MIL/uL — ABNORMAL LOW (ref 4.22–5.81)
RDW: 16 % — ABNORMAL HIGH (ref 11.5–15.5)
WBC: 3.6 10*3/uL — ABNORMAL LOW (ref 4.0–10.5)

## 2017-11-19 LAB — COMPREHENSIVE METABOLIC PANEL
ALBUMIN: 3.3 g/dL — AB (ref 3.5–5.0)
ALK PHOS: 77 U/L (ref 38–126)
ALT: 31 U/L (ref 0–44)
ANION GAP: 7 (ref 5–15)
AST: 34 U/L (ref 15–41)
BILIRUBIN TOTAL: 0.8 mg/dL (ref 0.3–1.2)
BUN: 10 mg/dL (ref 8–23)
CALCIUM: 8.8 mg/dL — AB (ref 8.9–10.3)
CO2: 26 mmol/L (ref 22–32)
Chloride: 106 mmol/L (ref 98–111)
Creatinine, Ser: 1.05 mg/dL (ref 0.61–1.24)
GLUCOSE: 108 mg/dL — AB (ref 70–99)
POTASSIUM: 4.3 mmol/L (ref 3.5–5.1)
Sodium: 139 mmol/L (ref 135–145)
TOTAL PROTEIN: 6.2 g/dL — AB (ref 6.5–8.1)

## 2017-11-19 LAB — PROTIME-INR
INR: 1.42
PROTHROMBIN TIME: 17.2 s — AB (ref 11.4–15.2)

## 2017-11-19 LAB — I-STAT TROPONIN, ED
TROPONIN I, POC: 0 ng/mL (ref 0.00–0.08)
Troponin i, poc: 0 ng/mL (ref 0.00–0.08)

## 2017-11-19 LAB — TYPE AND SCREEN
ABO/RH(D): A POS
Antibody Screen: NEGATIVE

## 2017-11-19 LAB — POC OCCULT BLOOD, ED: Fecal Occult Bld: POSITIVE — AB

## 2017-11-19 NOTE — Discharge Instructions (Addendum)
Please follow-up with gastroenterology this week.  Return to the ED for worsening bleeding, chest pain, shortness of breath, persistent vomiting, abdominal pain, diaphoresis, near syncope/syncope, or any other major concerns.

## 2017-11-19 NOTE — ED Triage Notes (Signed)
Pt just released yesterday for internal bleeding and last night he noticed blood in his stool.  The bleeding had stopped before he left.  His endoscopy showed he had 7 ulcers. Pt is on coumadin (his INR was elevated last time due to taking a specific antibiotic).  Pt states when he walked down here he got right arm shoulder and arm pain but no chest pain

## 2017-11-19 NOTE — ED Provider Notes (Signed)
[facility] EMERGENCY DEPARTMENT Provider Note   CSN: [id number] Arrival date & time: 11/19/17  1131     History   Chief Complaint Chief Complaint  Patient presents with  . GI Bleeding  . Shoulder Pain    HPI SHAYAAN PARKE Sr. is a 77 y.o. male.  HPI   SHYNE LEHRKE Sr. is a 77 y.o. male, with a history of mechanical aortic valve, HTN, A. fib, CHF, presenting to the ED with bright red blood per rectum recurring yesterday.  He has had three bowel movements over last 24 hours, each with blood mixed with stool.   On the drive here to the ED, patient had an episode of right arm pain around the forearm and elbow, described as a "numbing pain," worsened until 9/10, lasted about 4 minutes, nonradiating. Resolved prior to arrival and has not recurred, even with exertion. Has not felt this pain before.   Denies fever/chills, N/V/C/D, abdominal pain, chest pain, shortness of breath, melena, urinary symptoms, dizziness, diaphoresis, or any other complaints.    Past Medical History:  Diagnosis Date  . AICD (automatic cardioverter/defibrillator) present 07/11/2012  . Aortic aneurysm, thoracic ([place])    Fusiform s/p Bentall procedure 2002  . Aortic valve, bicuspid    Mechanical AVR  . Basal cell carcinoma of face    "burned off" (11/06/2017)  . BPH (benign prostatic hypertrophy)   . CHF (congestive heart failure) ([place])    "once" (11/06/2017)  . Coronary atherosclerosis of native coronary artery    Nonobstructive at cardiac catherization 2002  . Essential hypertension, benign   . Left bundle branch block   . Mixed hyperlipidemia   . Nonischemic cardiomyopathy (HCC)    CRT-D  . Persistent atrial fibrillation   . Presence of permanent cardiac pacemaker     Patient Active Problem List   Diagnosis Date Noted  . GI bleed 11/05/2017  . Supratherapeutic INR 11/05/2017  . Acute blood loss anemia 11/05/2017  . Guaiac positive stools 08/16/2016  . ICD  (implantable cardioverter-defibrillator) in place 02/19/2014  . Chronic combined systolic and diastolic heart failure ([place]) 07/02/2012  . Left bundle branch block 07/02/2012  . Long term (current) use of anticoagulants 04/10/2012  . S/P aortic valve replacement with metallic valve 62/95/2841  . CORONARY ATHEROSCLEROSIS NATIVE CORONARY ARTERY 01/02/2009  . Nonischemic cardiomyopathy ([place]) 01/02/2009  . Essential hypertension, benign 10/08/2008  . Mixed hyperlipidemia 04/02/2008  . Atrial fibrillation with rapid ventricular response (HCC) -status post cardioversion 04/02/2008    Past Surgical History:  Procedure Laterality Date  . BENTALL PROCEDURE  2002   Dr Cyndia Bent   . BI-VENTRICULAR IMPLANTABLE CARDIOVERTER DEFIBRILLATOR N/A 07/12/2012   Procedure: BI-VENTRICULAR IMPLANTABLE CARDIOVERTER DEFIBRILLATOR  (CRT-D);  Surgeon: [name], MD;  Location: [facility] CATH LAB;  Service: Cardiovascular;  Laterality: N/A;  . BI-VENTRICULAR IMPLANTABLE CARDIOVERTER DEFIBRILLATOR  (CRT-D)  07/11/2012   [facility]- Dr. Rayann Heman  . CARDIAC CATHETERIZATION  2002  . CARDIAC VALVE REPLACEMENT     AVR  . CATARACT EXTRACTION Left 01/21/13  . COLONOSCOPY N/A 12/01/2016   Procedure: COLONOSCOPY;  Surgeon: Rogene Houston, MD;  Location: [facility]O [facility];  Service: Endoscopy;  Laterality: N/A;  2:00  . COLONOSCOPY WITH PROPOFOL N/A 11/08/2017   Procedure: COLONOSCOPY WITH PROPOFOL;  Surgeon: Ronnette Juniper, MD;  Location: [facility];  Service: Gastroenterology;  Laterality: N/A;  . ESOPHAGOGASTRODUODENOSCOPY (EGD) WITH PROPOFOL N/A 11/08/2017   Procedure: ESOPHAGOGASTRODUODENOSCOPY (EGD) WITH PROPOFOL Needs INR<2;  Surgeon: Therisa Doyne,  Megan Salon, MD;  Location: [facility];  Service: Gastroenterology;  Laterality: N/A;  . HERNIA REPAIR    . IR ANGIOGRAM SELECTIVE EACH ADDITIONAL VESSEL  11/10/2017  . IR ANGIOGRAM VISCERAL SELECTIVE  11/10/2017  . IR ANGIOGRAM VISCERAL SELECTIVE  11/10/2017  . IR ANGIOGRAM  VISCERAL SELECTIVE  11/10/2017  . IR EMBO ART  VEN HEMORR LYMPH EXTRAV  INC GUIDE ROADMAPPING  11/10/2017  . IR US GUIDE VASC ACCESS RIGHT  11/10/2017  . POLYPECTOMY  11/08/2017   Procedure: POLYPECTOMY;  Surgeon: Ronnette Juniper, MD;  Location[facility] ENDOSCOPY;  Service: Gastroenterology;;  . TONSILLECTOMY AND ADENOIDECTOMY  1949  . UMBILICAL HERNIA REPAIR  09/2017        Home Medications    Prior to Admission medications   Medication Sig Start Date End Date Taking? Authorizing Provider  acetaminophen (TYLENOL) 500 MG tablet Take 1,000 mg by mouth every 6 (six) hours as needed for headache (pain).   Yes [provider]  atorvastatin (LIPITOR) 10 MG tablet Take 10 mg by mouth daily at 6 PM.    Yes [provider]  cholecalciferol (VITAMIN D) 1000 units tablet Take 1,000 Units by mouth at bedtime.    Yes [provider]  enoxaparin (LOVENOX) 80 MG/0.8ML injection Inject 0.8 mLs (80 mg total) into the skin every 12 (twelve) hours for 14 days. 11/18/17 12/02/17 Yes Alma Friendly, MD  ferrous sulfate 325 (65 FE) MG tablet Take 1 tablet (325 mg total) by mouth every other day. 11/19/17 12/19/17 Yes Alma Friendly, MD  furosemide (LASIX) 20 MG tablet Take 20 mg by mouth daily.    Yes [provider]  meclizine (ANTIVERT) 25 MG tablet Take 25 mg by mouth 3 (three) times daily as needed for dizziness.   Yes [provider]  metoprolol succinate (TOPROL-XL) 100 MG 24 hr tablet TAKE 1 AND 1/2 TABLETS BY MOUTH TWICE DAILY Patient taking differently: Take 150 mg by mouth 2 (two) times daily.  08/28/17  Yes Satira Sark, MD  metroNIDAZOLE (METROCREAM) 0.75 % cream Apply 1 application topically daily as needed (rosacea).   Yes [provider]  Omega-3 Fatty Acids (FISH OIL) 1000 MG CAPS Take 1,000 mg by mouth daily.    Yes [provider]  oxymetazoline (AFRIN) 0.05 % nasal spray Place 1 spray into both nostrils daily as needed for  congestion.   Yes [provider]  pantoprazole (PROTONIX) 40 MG tablet Take 1 tablet (40 mg total) by mouth 2 (two) times daily before a meal. 11/18/17 12/18/17 Yes Alma Friendly, MD  phenylephrine (NEO-SYNEPHRINE) 1 % nasal spray Place 1 drop into both nostrils daily as needed for congestion.   Yes [provider]  sucralfate (CARAFATE) 1 g tablet Take 1 tablet (1 g total) by mouth 4 (four) times daily -  with meals and at bedtime. 11/18/17 12/18/17 Yes Alma Friendly, MD  Tamsulosin HCl (FLOMAX) 0.4 MG CAPS Take 0.4 mg by mouth at bedtime.    Yes [provider]  vitamin C (ASCORBIC ACID) 500 MG tablet Take 500 mg by mouth daily.   Yes [provider]  warfarin (COUMADIN) 5 MG tablet Take 5 mg by mouth at bedtime.    Yes [provider]    Family History Family History  Problem Relation Age of Onset  . Coronary artery disease Father     Social History Social History   Tobacco Use  . Smoking status: Former Smoker    Packs/day:  1.00    Years: 41.00    Pack years: 41.00    Types: Cigarettes    Last attempt to quit: 02/15/2000    Years since quitting: 17.7  . Smokeless tobacco: Former Systems developer    Types: Snuff, Chew  . Tobacco comment: 11/06/2017 "used chew and snuff in my teens"  Substance Use Topics  . Alcohol use: Yes    Comment: 11/06/2017  "2-3 glasses of wine/month"  . Drug use: Never     Allergies   Patient has no known allergies.   Review of Systems Review of Systems  Constitutional: Negative for chills, diaphoresis and fever.  Respiratory: Negative for shortness of breath.   Cardiovascular: Negative for chest pain.  Gastrointestinal: Positive for blood in stool. Negative for abdominal pain, diarrhea and vomiting.  Genitourinary: Negative for dysuria, flank pain, frequency and hematuria.  Musculoskeletal: Negative for back pain.  Neurological: Negative for dizziness, syncope, weakness and light-headedness.  All other  systems reviewed and are negative.    Physical Exam Updated Vital Signs BP 124/71   Pulse 83   Temp 98.2 F (36.8 C) (Oral)   Resp 18   SpO2 100%   Physical Exam  Constitutional: He appears well-developed and well-nourished. No distress.  HENT:  Head: Normocephalic and atraumatic.  Eyes: Conjunctivae are normal.  Neck: Neck supple.  Cardiovascular: Normal rate, regular rhythm, normal heart sounds and intact distal pulses.  Pulmonary/Chest: Effort normal and breath sounds normal. No respiratory distress.  Abdominal: Soft. There is no tenderness. There is no guarding.  Genitourinary: Rectal exam shows guaiac positive stool.  Genitourinary Comments: No external hemorrhoids, fissures, or lesions noted. No gross blood, melena, or stool burden. No rectal tenderness. No foreign bodies noted.  RN, Jinny Blossom, served as chaperone during the rectal exam.  Musculoskeletal: He exhibits no edema.  Lymphadenopathy:    He has no cervical adenopathy.  Neurological: He is alert.  Skin: Skin is warm and dry. He is not diaphoretic.  Psychiatric: He has a normal mood and affect. His behavior is normal.  Nursing note and vitals reviewed.    ED Treatments / Results  Labs (all labs ordered are listed, but only abnormal results are displayed) Labs Reviewed  COMPREHENSIVE METABOLIC PANEL - Abnormal; Notable for the following components:      Result Value   Glucose, Bld 108 (*)    Calcium 8.8 (*)    Total Protein 6.2 (*)    Albumin 3.3 (*)    All other components within normal limits  CBC - Abnormal; Notable for the following components:   WBC 3.6 (*)    RBC 3.56 (*)    Hemoglobin 10.3 (*)    HCT 34.3 (*)    RDW 16.0 (*)    All other components within normal limits  PROTIME-INR - Abnormal; Notable for the following components:   Prothrombin Time 17.2 (*)    All other components within normal limits  POC OCCULT BLOOD, ED - Abnormal; Notable for the following components:   Fecal Occult Bld  POSITIVE (*)    All other components within normal limits  I-STAT TROPONIN, ED  I-STAT TROPONIN, ED  TYPE AND SCREEN   Hemoglobin  Date Value Ref Range Status  11/19/2017 10.3 (L) 13.0 - 17.0 g/dL Final  11/18/2017 9.3 (L) 13.0 - 17.0 g/dL Final  11/17/2017 9.6 (L) 13.0 - 17.0 g/dL Final  11/16/2017 9.4 (L) 13.0 - 17.0 g/dL Final    EKG EKG Interpretation  Date/Time:  Sunday November 19 2017 11:51:27 EDT Ventricular Rate:  121 PR Interval:    QRS Duration: 146 QT Interval:  142 QTC Calculation: 201 R Axis:   -97 Text Interpretation:  Suspect arm lead reversal, interpretation assumes no reversal Demand pacemaker; interpretation is based on intrinsic rhythm Wide QRS tachycardia with Fusion complexes Non-specific intra-ventricular conduction block Possible Right ventricular hypertrophy Inferior infarct , age undetermined Anterolateral infarct , age undetermined Abnormal ECG Confirmed by Dene Gentry [phone]) on 11/19/2017 1:10:00 PM   EKG Interpretation  Date/Time:  Sunday November 19 2017 15:00:39 EDT Ventricular Rate:  84 PR Interval:    QRS Duration: 123 QT Interval:  449 QTC Calculation: 518 R Axis:   -54 Text Interpretation:  A-V dual-paced complexes w/ some inhibition No further analysis attempted due to paced rhythm Confirmed by Dene Gentry [phone]) on 11/19/2017 3:11:52 PM       Radiology No results found.  Procedures Procedures (including critical care time)  Medications Ordered in ED Medications - No data to display   Initial Impression / Assessment and Plan / ED Course  I have reviewed the triage vital signs and the nursing notes.  Pertinent labs & imaging results that were available during my care of the patient were reviewed by me and considered in my medical decision making (see chart for details).  Clinical Course as of Nov 19 1621  Sun Nov 19, 2017  1437 Spoke with Dr. Watt Climes, on call for [facility]. Agrees with plan for discharge and office follow up  this week.  States patient can expect some continued bright red bleeding, but this should improve over the next few days.  He may continue with his prescribed medications, including the Lovenox and the Coumadin.  Should the bleeding worsen or become more frequent, or if he should begin to have other symptoms, such as chest pain, shortness of breath, persistent vomiting, abdominal pain, diaphoresis, near syncope/syncope, or other signs of worsening bleeding, he should return to the ED.   [SJ]    Clinical Course User Index [SJ] Ellise Kovack C, PA-C    Patient presents with bright red rectal bleeding.  No frank blood on rectal exam, though Hemoccult positive.  Hemoglobin improved from yesterday.  INR still subtherapeutic.  Close GI follow-up.  Return precautions discussed.  Patient voices understanding of these instructions, accepts the plan, and is comfortable with discharge.  Findings and plan of care discussed with Dene Gentry, MD.   Vitals:   11/19/17 1445 11/19/17 1500 11/19/17 1515 11/19/17 1545  BP: 120/68 114/72 119/72 137/81  Pulse: 80 80 80 80  Resp: 19 18 16 17   Temp:      TempSrc:      SpO2: 97% 97% 98% 98%      Final Clinical Impressions(s) / ED Diagnoses   Final diagnoses:  Rectal bleeding    ED Discharge Orders    None       Layla Maw 11/19/17 1625    Valarie Merino, MD 11/19/17 2210

## 2017-11-19 NOTE — ED Notes (Signed)
Patient verbalizes understanding of discharge instructions. Opportunity for questioning and answers were provided. Armband removed by staff, pt discharged from ED. Pt denied any further requests.

## 2017-11-20 ENCOUNTER — Ambulatory Visit (INDEPENDENT_AMBULATORY_CARE_PROVIDER_SITE_OTHER): Payer: Medicare HMO

## 2017-11-20 DIAGNOSIS — Z95810 Presence of automatic (implantable) cardiac defibrillator: Secondary | ICD-10-CM | POA: Diagnosis not present

## 2017-11-20 DIAGNOSIS — I5022 Chronic systolic (congestive) heart failure: Secondary | ICD-10-CM

## 2017-11-21 NOTE — Progress Notes (Signed)
EPIC Encounter for ICM Monitoring  Patient Name: Timothy Fritz. is a 77 y.o. male Date: 11/21/2017 Primary Care Physican: [name], MD Primary Cardiologist:[facility] Electrophysiologist: Allred Dry Weight:195lbs Bi-V Pacing: 53% (was 92%on 10/19/2017) AT/AF Burden 88%             Transmission reviewed by Levander Campion, Device RN for decreased BiV Pacing and Non-sustained VT/VF.  Also appeared patient is in Afib.  Will send to Dr Rayann Heman for review.    Heart Failure questions reviewed, pt asymptomatic now and recovering from recent hospitalization.  He still feels weak and trying to regain his strength.  Hospitalization from 9/22 to 10/5 due to GI bleeding and ER visit 10/6.    Thoracic impedance normal but was abnormal suggesting fluid accumulation from 11/05/2017 - 11/15/2017 which correlates with hospitalization.  Numerous ICD shocks per ER notes occurred before patient transferred from [facility]  Prescribed:  Furosemide 20 mg 1 tablet as needed  Labs: 11/19/2017 Creatinine 1.05, BUN 10, Potassium 4.3, Sodium 139, EGFR >60 11/17/2017 Creatinine 0.86, BUN 10, Potassium 3.9, Sodium 139, EGFR >60  11/15/2017 Creatinine 0.93, BUN 12, Potassium 3.8, Sodium 139, EGFR >60  11/14/2017 Creatinine 1.15, BUN 14, Potassium 3.5, Sodium 141, EGFR >60  11/12/2017 Creatinine 0.88, BUN 14, Potassium 3.9, Sodium 135, EGFR >60  11/11/2017 Creatinine 0.97, BUN 18, Potassium 4.0, Sodium 136, EGFR >60  A complete set of results can be found in Results Review.  Recommendations: No changes.   Encouraged to call for fluid symptoms.  Follow-up plan: ICM clinic phone appointment on 12/05/2017 to recheck fluid levels.   Office appointment scheduled 12/25/2017 with Dr. Domenic Polite.    Copy of ICM check sent to Dr. Rayann Heman.   3 month ICM trend: 11/20/2017    AT/AF    1 Year ICM trend:       [name], RN 11/21/2017 9:27 AM

## 2017-11-27 DIAGNOSIS — I4891 Unspecified atrial fibrillation: Secondary | ICD-10-CM | POA: Diagnosis not present

## 2017-11-27 DIAGNOSIS — I509 Heart failure, unspecified: Secondary | ICD-10-CM | POA: Diagnosis not present

## 2017-11-27 DIAGNOSIS — Z299 Encounter for prophylactic measures, unspecified: Secondary | ICD-10-CM | POA: Diagnosis not present

## 2017-11-27 DIAGNOSIS — I10 Essential (primary) hypertension: Secondary | ICD-10-CM | POA: Diagnosis not present

## 2017-11-27 DIAGNOSIS — R202 Paresthesia of skin: Secondary | ICD-10-CM | POA: Diagnosis not present

## 2017-11-27 DIAGNOSIS — Z6831 Body mass index (BMI) 31.0-31.9, adult: Secondary | ICD-10-CM | POA: Diagnosis not present

## 2017-11-27 DIAGNOSIS — K254 Chronic or unspecified gastric ulcer with hemorrhage: Secondary | ICD-10-CM | POA: Diagnosis not present

## 2017-11-28 NOTE — Progress Notes (Signed)
Message received from Dr Rivka Spring, MD  Short, Laurie Panda, RN        hes probably permanent afib. We reprogrammed zones for therapy as he had been shocked for afib with RVR.   hes had issues with low V pacing before. I have offered AV nodal ablation in the past and he has refused.

## 2017-11-29 DIAGNOSIS — Z299 Encounter for prophylactic measures, unspecified: Secondary | ICD-10-CM | POA: Diagnosis not present

## 2017-11-29 DIAGNOSIS — I4891 Unspecified atrial fibrillation: Secondary | ICD-10-CM | POA: Diagnosis not present

## 2017-11-29 DIAGNOSIS — I428 Other cardiomyopathies: Secondary | ICD-10-CM | POA: Diagnosis not present

## 2017-11-29 DIAGNOSIS — I509 Heart failure, unspecified: Secondary | ICD-10-CM | POA: Diagnosis not present

## 2017-11-29 DIAGNOSIS — I10 Essential (primary) hypertension: Secondary | ICD-10-CM | POA: Diagnosis not present

## 2017-11-29 DIAGNOSIS — Z6831 Body mass index (BMI) 31.0-31.9, adult: Secondary | ICD-10-CM | POA: Diagnosis not present

## 2017-12-05 ENCOUNTER — Ambulatory Visit (INDEPENDENT_AMBULATORY_CARE_PROVIDER_SITE_OTHER): Payer: Medicare HMO

## 2017-12-05 DIAGNOSIS — I5022 Chronic systolic (congestive) heart failure: Secondary | ICD-10-CM

## 2017-12-05 DIAGNOSIS — Z95810 Presence of automatic (implantable) cardiac defibrillator: Secondary | ICD-10-CM

## 2017-12-05 NOTE — Progress Notes (Signed)
EPIC Encounter for ICM Monitoring  Patient Name: Timothy Fritz. is a 77 y.o. male Date: 12/05/2017 Primary Care Physican: [name], MD Primary Cardiologist:[facility] Electrophysiologist: Allred Dry Weight:195lbs Bi-V Pacing: 68%  AT/AF Burden 94%      Heart Failure questions reviewed, pt asymptomatic. (Per Dr Rayann Heman patient has history of low pacing and reprogrammed zones for therapy as he had been shocked for afib with RVR. Pt has declined AV nodal ablation in the past)    Thoracic impedance normal.   Prescribed: Furosemide 20 mg 1 tablet as needed  Labs: 11/19/2017 Creatinine 1.05, BUN 10, Potassium 4.3, Sodium 139, EGFR >60 11/17/2017 Creatinine 0.86, BUN 10, Potassium 3.9, Sodium 139, EGFR >60  11/15/2017 Creatinine 0.93, BUN 12, Potassium 3.8, Sodium 139, EGFR >60  11/14/2017 Creatinine 1.15, BUN 14, Potassium 3.5, Sodium 141, EGFR >60  11/12/2017 Creatinine 0.88, BUN 14, Potassium 3.9, Sodium 135, EGFR >60  11/11/2017 Creatinine 0.97, BUN 18, Potassium 4.0, Sodium 136, EGFR >60  A complete set of results can be found in Results Review.  Recommendations: No changes.  Encouraged to call for fluid symptoms.  Follow-up plan: ICM clinic phone appointment on 12/21/2017.   Office appointment scheduled 12/25/2017 with Dr. Domenic Polite.    Copy of ICM check sent to Dr. Rayann Heman.   3 month ICM trend: 12/05/2017    1 Year ICM trend:       [name], RN 12/05/2017 10:51 AM

## 2017-12-15 DIAGNOSIS — Z299 Encounter for prophylactic measures, unspecified: Secondary | ICD-10-CM | POA: Diagnosis not present

## 2017-12-15 DIAGNOSIS — I509 Heart failure, unspecified: Secondary | ICD-10-CM | POA: Diagnosis not present

## 2017-12-15 DIAGNOSIS — Z6831 Body mass index (BMI) 31.0-31.9, adult: Secondary | ICD-10-CM | POA: Diagnosis not present

## 2017-12-15 DIAGNOSIS — R350 Frequency of micturition: Secondary | ICD-10-CM | POA: Diagnosis not present

## 2017-12-15 DIAGNOSIS — I4891 Unspecified atrial fibrillation: Secondary | ICD-10-CM | POA: Diagnosis not present

## 2017-12-15 DIAGNOSIS — I10 Essential (primary) hypertension: Secondary | ICD-10-CM | POA: Diagnosis not present

## 2017-12-21 ENCOUNTER — Ambulatory Visit (INDEPENDENT_AMBULATORY_CARE_PROVIDER_SITE_OTHER): Payer: Medicare HMO

## 2017-12-21 DIAGNOSIS — Z95810 Presence of automatic (implantable) cardiac defibrillator: Secondary | ICD-10-CM | POA: Diagnosis not present

## 2017-12-21 DIAGNOSIS — I5022 Chronic systolic (congestive) heart failure: Secondary | ICD-10-CM

## 2017-12-22 ENCOUNTER — Encounter: Payer: Self-pay | Admitting: Cardiology

## 2017-12-22 DIAGNOSIS — I509 Heart failure, unspecified: Secondary | ICD-10-CM | POA: Diagnosis not present

## 2017-12-22 DIAGNOSIS — I10 Essential (primary) hypertension: Secondary | ICD-10-CM | POA: Diagnosis not present

## 2017-12-22 DIAGNOSIS — Z299 Encounter for prophylactic measures, unspecified: Secondary | ICD-10-CM | POA: Diagnosis not present

## 2017-12-22 DIAGNOSIS — Z6831 Body mass index (BMI) 31.0-31.9, adult: Secondary | ICD-10-CM | POA: Diagnosis not present

## 2017-12-22 DIAGNOSIS — I4891 Unspecified atrial fibrillation: Secondary | ICD-10-CM | POA: Diagnosis not present

## 2017-12-22 NOTE — Progress Notes (Signed)
EPIC Encounter for ICM Monitoring  Patient Name: Ralph Brouwer. is a 77 y.o. male Date: 12/22/2017 Primary Care Physican: [name], MD Primary Cardiologist:Mc[place]owell Electrophysiologist: Allred Bi-V Pacing: 68%  Last Weight: 195lbs Today's Weight: 195 lbs   AT/AF Burden96%      Heart Failure questions reviewed, pt asymptomatic.   Thoracic impedance normal.   Prescribed:  Furosemide 20 mg 1 tablet as needed  Labs: 11/19/2017 Creatinine1.05, BUN10, Potassium4.3, Sodium139, EGFR>60 11/17/2017 Creatinine0.86, BUN10, Potassium3.9, Sodium139, EGFR>60  11/15/2017 Creatinine0.93, BUN12, Potassium3.8, Sodium139, EGFR>60  11/14/2017 Creatinine1.15, BUN14, Potassium3.5, Sodium141, EGFR>60  11/12/2017 Creatinine0.88, BUN14, Potassium3.9, Sodium135, EGFR>60  11/11/2017 Creatinine0.97, BUN18, Potassium4.0, Sodium136, EGFR>60 A complete set of results can be found in Results Review.  Recommendations: No changes.    Encouraged to call for fluid symptoms.  Follow-up plan: ICM clinic phone appointment on 01/22/2018.   Office appointment scheduled 12/25/2017 with Dr. Domenic Polite.    Copy of ICM check sent to Dr. Rayann Heman.   3 month ICM trend: 12/22/2017    1 Year ICM trend:       [name], RN 12/22/2017 11:49 AM

## 2017-12-22 NOTE — Progress Notes (Signed)
Cardiology Office Note  Date: 12/25/2017   ID: Timothy Fowler Sr., DOB [birth year], MRN [id number]  PCP: [name], MD  Primary Cardiologist: Rozann Lesches, MD   Chief Complaint  Patient presents with  . Cardiomyopathy    History of Present Illness: Timothy Horen. is a 77 y.o. male last seen in May.  He is here for a follow-up visit. Interval records reviewed, he was hospitalized in October with GI bleed.  He was found to have 3 large nonbleeding ulcers by EGD and 2 colonic polyps that were resected.  He did require embolization of the gastroduodenal artery ultimately.  Losartan was discontinued due to low blood pressures at that time.  Otherwise his cardiac regimen is stable, including being back on Coumadin with mechanical AVR.  Follow-up echocardiogram from September is outlined below.  LVEF was 40 to 45% at that time.  He remains on Coumadin with follow-up per Dr. Manuella Ghazi.  He states his most recent INR was 2.0.  No changes in stools.  He sees Dr. Rayann Heman in the device clinic, [place] CRT-D in place.  Recent thoracic impedance was normal.  He uses Lasix only as needed.  Past Medical History:  Diagnosis Date  . AICD (automatic cardioverter/defibrillator) present 07/11/2012  . Aortic aneurysm, thoracic ([place])    Fusiform s/p Bentall procedure 2002  . Aortic valve, bicuspid    Mechanical AVR  . Basal cell carcinoma of face   . BPH (benign prostatic hypertrophy)   . Coronary atherosclerosis of native coronary artery    Nonobstructive at cardiac catherization 2002  . Essential hypertension, benign   . Left bundle branch block   . Mixed hyperlipidemia   . Nonischemic cardiomyopathy (HCC)    CRT-D  . Persistent atrial fibrillation   . Presence of permanent cardiac pacemaker     Past Surgical History:  Procedure Laterality Date  . BENTALL PROCEDURE  2002   Dr Cyndia Bent   . BI-VENTRICULAR IMPLANTABLE CARDIOVERTER DEFIBRILLATOR N/A 07/12/2012   Procedure:  BI-VENTRICULAR IMPLANTABLE CARDIOVERTER DEFIBRILLATOR  (CRT-D);  Surgeon: [name], MD;  Location: [facility] CATH LAB;  Service: Cardiovascular;  Laterality: N/A;  . BI-VENTRICULAR IMPLANTABLE CARDIOVERTER DEFIBRILLATOR  (CRT-D)  07/11/2012   [facility]- Dr. Rayann Heman  . CARDIAC CATHETERIZATION  2002  . CARDIAC VALVE REPLACEMENT     AVR  . CATARACT EXTRACTION Left 01/21/13  . COLONOSCOPY N/A 12/01/2016   Procedure: COLONOSCOPY;  Surgeon: Rogene Houston, MD;  [facility];  Service: Endoscopy;  Laterality: N/A;  2:00  . COLONOSCOPY WITH PROPOFOL N/A 11/08/2017   Procedure: COLONOSCOPY WITH PROPOFOL;  Surgeon: Ronnette Juniper, MD;  Location: [place];  Service: Gastroenterology;  Laterality: N/A;  . ESOPHAGOGASTRODUODENOSCOPY (EGD) WITH PROPOFOL N/A 11/08/2017   Procedure: ESOPHAGOGASTRODUODENOSCOPY (EGD) WITH PROPOFOL Needs INR<2;  Surgeon: Ronnette Juniper, MD;  Location: [facility];  Service: Gastroenterology;  Laterality: N/A;  . HERNIA REPAIR    . IR ANGIOGRAM SELECTIVE EACH ADDITIONAL VESSEL  11/10/2017  . IR ANGIOGRAM VISCERAL SELECTIVE  11/10/2017  . IR ANGIOGRAM VISCERAL SELECTIVE  11/10/2017  . IR ANGIOGRAM VISCERAL SELECTIVE  11/10/2017  . IR EMBO ART  VEN HEMORR LYMPH EXTRAV  INC GUIDE ROADMAPPING  11/10/2017  . IR US GUIDE VASC ACCESS RIGHT  11/10/2017  . POLYPECTOMY  11/08/2017   Procedure: POLYPECTOMY;  Surgeon: Ronnette Juniper, MD;  Location: [facility] ENDOSCOPY;  Service: Gastroenterology;;  . TONSILLECTOMY AND ADENOIDECTOMY  1949  . UMBILICAL HERNIA REPAIR  09/2017  Current Outpatient Medications  Medication Sig Dispense Refill  . acetaminophen (TYLENOL) 500 MG tablet Take 1,000 mg by mouth every 6 (six) hours as needed for headache (pain).    Marland Kitchen atorvastatin (LIPITOR) 10 MG tablet Take 10 mg by mouth daily at 6 PM.     . cholecalciferol (VITAMIN D) 1000 units tablet Take 1,000 Units by mouth at bedtime.     . enoxaparin (LOVENOX) 80 MG/0.8ML injection Inject 0.8  mLs (80 mg total) into the skin every 12 (twelve) hours for 14 days. 22.4 mL 0  . ferrous sulfate 325 (65 FE) MG tablet Take 1 tablet (325 mg total) by mouth every other day. 15 tablet 0  . furosemide (LASIX) 20 MG tablet Take 20 mg by mouth daily.     . meclizine (ANTIVERT) 25 MG tablet Take 25 mg by mouth 3 (three) times daily as needed for dizziness.    . metoprolol succinate (TOPROL-XL) 100 MG 24 hr tablet TAKE 1 AND 1/2 TABLETS BY MOUTH TWICE DAILY (Patient taking differently: Take 150 mg by mouth 2 (two) times daily. ) 270 tablet 1  . metroNIDAZOLE (METROCREAM) 0.75 % cream Apply 1 application topically daily as needed (rosacea).    . Omega-3 Fatty Acids (FISH OIL) 1000 MG CAPS Take 1,000 mg by mouth daily.     Marland Kitchen oxymetazoline (AFRIN) 0.05 % nasal spray Place 1 spray into both nostrils daily as needed for congestion.    . pantoprazole (PROTONIX) 40 MG tablet Take 1 tablet (40 mg total) by mouth 2 (two) times daily before a meal. 60 tablet 0  . phenylephrine (NEO-SYNEPHRINE) 1 % nasal spray Place 1 drop into both nostrils daily as needed for congestion.    . sucralfate (CARAFATE) 1 g tablet Take 1 tablet (1 g total) by mouth 4 (four) times daily -  with meals and at bedtime. 120 tablet 0  . Tamsulosin HCl (FLOMAX) 0.4 MG CAPS Take 0.4 mg by mouth at bedtime.     . vitamin C (ASCORBIC ACID) 500 MG tablet Take 500 mg by mouth daily.    Marland Kitchen warfarin (COUMADIN) 5 MG tablet Take 5 mg by mouth at bedtime.     Marland Kitchen losartan (COZAAR) 25 MG tablet Take 0.5 tablets (12.5 mg total) by mouth at bedtime. 45 tablet 1   No current facility-administered medications for this visit.    Allergies:  Patient has no known allergies.   Social History: The patient  reports that he quit smoking about 17 years ago. His smoking use included cigarettes. He has a 41.00 pack-year smoking history. He has quit using smokeless tobacco.  His smokeless tobacco use included snuff and chew. He reports that he drinks alcohol. He  reports that he does not use drugs.   ROS:  Please see the history of present illness. Otherwise, complete review of systems is positive for none.  All other systems are reviewed and negative.   Physical Exam: VS:  BP 116/72   Pulse 84   Ht 5\' 8"  (1.727 m)   Wt 204 lb 6.4 oz (92.7 kg)   SpO2 98%   BMI 31.08 kg/m , BMI Body mass index is 31.08 kg/m.  Wt Readings from Last 3 Encounters:  12/25/17 204 lb 6.4 oz (92.7 kg)  11/18/17 194 lb 4.8 oz (88.1 kg)  09/15/17 207 lb 3.2 oz (94 kg)    General: Patient appears comfortable at rest. HEENT: Conjunctiva and lids normal, oropharynx clear. Neck: Supple, no elevated JVP or carotid bruits,  no thyromegaly. Lungs: Clear to auscultation, nonlabored breathing at rest. Cardiac: Irregular, no S3 or significant systolic murmur. Abdomen: Soft, nontender, bowel sounds present. Extremities: No pitting edema, distal pulses 2+. Skin: Warm and dry. Musculoskeletal: No kyphosis. Neuropsychiatric: Alert and oriented x3, affect grossly appropriate.  ECG: I personally reviewed the tracing from 11/19/2017 which showed intermittent dual-chamber pacing with PVCs.   Recent Labwork: 11/05/2017: Magnesium 2.0 11/19/2017: ALT 31; AST 34; BUN 10; Creatinine, Ser 1.05; Hemoglobin 10.3; Platelets 206; Potassium 4.3; Sodium 139   Other Studies Reviewed Today:  Echocardiogram 11/06/2017: Study Conclusions  - Left ventricle: The cavity size was normal. Systolic function was   mildly to moderately reduced. The estimated ejection fraction was   in the range of 40% to 45%. - Aortic valve: Mildly calcified annulus. Moderately thickened,   moderately calcified leaflets. There was moderate stenosis. Valve   area (VTI): 1.59 cm^2. Valve area (Vmax): 1.61 cm^2. Valve area   (Vmean): 1.34 cm^2. - Mitral valve: There was mild regurgitation. - Left atrium: The atrium was moderately dilated. - Right atrium: The atrium was mildly dilated. - Pulmonary arteries: Systolic  pressure was mildly increased. PA   peak pressure: 34 mm Hg (S).  Assessment and Plan:  1.  Nonischemic cardiomyopathy with LVEF 40 to 45% by last check in September.  He continues to use Lasix only as needed.  We will continue beta-blocker and try to resume losartan although start very low at 12.5 mg daily.  Follow-up BMET in 2 weeks.  2.  Paroxysmal atrial for ablation/flutter.  He continues on Coumadin.  3.  Aortic valve disease status post Bentall procedure with mechanical AVR.  He is on chronic Coumadin, currently followed by Dr. Manuella Ghazi.  4.  Recent GI bleed in October as outlined above.  He remains on Protonix and Carafate with GI follow-up planned next week.  Last hemoglobin was 10.3.  Current medicines were reviewed with the patient today.   Orders Placed This Encounter  Procedures  . Basic Metabolic Panel (BMET)    Disposition: Follow-up in 3 months.  Signed, Satira Sark, MD, [facility] 12/25/2017 1:25 PM    [place] at [place] [address], [place], [place] [address] Phone: [phone]; Fax: [phone]

## 2017-12-25 ENCOUNTER — Encounter: Payer: Self-pay | Admitting: Cardiology

## 2017-12-25 ENCOUNTER — Ambulatory Visit (INDEPENDENT_AMBULATORY_CARE_PROVIDER_SITE_OTHER): Payer: Medicare HMO | Admitting: Cardiology

## 2017-12-25 VITALS — BP 116/72 | HR 84 | Ht 68.0 in | Wt 204.4 lb

## 2017-12-25 DIAGNOSIS — I480 Paroxysmal atrial fibrillation: Secondary | ICD-10-CM | POA: Diagnosis not present

## 2017-12-25 DIAGNOSIS — Z8719 Personal history of other diseases of the digestive system: Secondary | ICD-10-CM | POA: Diagnosis not present

## 2017-12-25 DIAGNOSIS — Z954 Presence of other heart-valve replacement: Secondary | ICD-10-CM

## 2017-12-25 DIAGNOSIS — I428 Other cardiomyopathies: Secondary | ICD-10-CM | POA: Diagnosis not present

## 2017-12-25 MED ORDER — LOSARTAN POTASSIUM 25 MG PO TABS: 12.5000 mg | ORAL_TABLET | Freq: Every day | ORAL | 1 refills | Status: DC

## 2017-12-25 NOTE — Patient Instructions (Addendum)
Your physician recommends that you schedule a follow-up appointment in: [place] has recommended you make the following change in your medication:   START LOSARTAN 12.5 MG (1/2 TABLET) IN THE EVENING   Your physician recommends that you return for lab work in: [place]  Thank you for choosing [facility]!!

## 2017-12-28 DIAGNOSIS — K269 Duodenal ulcer, unspecified as acute or chronic, without hemorrhage or perforation: Secondary | ICD-10-CM | POA: Diagnosis not present

## 2017-12-28 DIAGNOSIS — K253 Acute gastric ulcer without hemorrhage or perforation: Secondary | ICD-10-CM | POA: Diagnosis not present

## 2018-01-16 DIAGNOSIS — I428 Other cardiomyopathies: Secondary | ICD-10-CM | POA: Diagnosis not present

## 2018-01-16 DIAGNOSIS — I10 Essential (primary) hypertension: Secondary | ICD-10-CM | POA: Diagnosis not present

## 2018-01-16 DIAGNOSIS — Z6832 Body mass index (BMI) 32.0-32.9, adult: Secondary | ICD-10-CM | POA: Diagnosis not present

## 2018-01-16 DIAGNOSIS — Z299 Encounter for prophylactic measures, unspecified: Secondary | ICD-10-CM | POA: Diagnosis not present

## 2018-01-16 DIAGNOSIS — I509 Heart failure, unspecified: Secondary | ICD-10-CM | POA: Diagnosis not present

## 2018-01-16 DIAGNOSIS — I4891 Unspecified atrial fibrillation: Secondary | ICD-10-CM | POA: Diagnosis not present

## 2018-01-18 ENCOUNTER — Telehealth: Payer: Self-pay | Admitting: *Deleted

## 2018-01-18 NOTE — Telephone Encounter (Signed)
-----   Message from Satira Sark, MD sent at 01/17/2018 10:55 AM EST ----- Results reviewed.  Renal function and potassium are normal after recent medication adjustments.  Continue same. A copy of this test should be forwarded to [name], MD.

## 2018-01-23 NOTE — Telephone Encounter (Signed)
Patient informed. Copy sent to PCP °

## 2018-01-26 ENCOUNTER — Encounter: Payer: Self-pay | Admitting: Cardiology

## 2018-01-29 DIAGNOSIS — Z09 Encounter for follow-up examination after completed treatment for conditions other than malignant neoplasm: Secondary | ICD-10-CM | POA: Diagnosis not present

## 2018-02-05 DIAGNOSIS — N401 Enlarged prostate with lower urinary tract symptoms: Secondary | ICD-10-CM | POA: Diagnosis not present

## 2018-02-05 DIAGNOSIS — D075 Carcinoma in situ of prostate: Secondary | ICD-10-CM | POA: Diagnosis not present

## 2018-02-05 DIAGNOSIS — R972 Elevated prostate specific antigen [PSA]: Secondary | ICD-10-CM | POA: Diagnosis not present

## 2018-02-05 DIAGNOSIS — N528 Other male erectile dysfunction: Secondary | ICD-10-CM | POA: Diagnosis not present

## 2018-02-06 ENCOUNTER — Ambulatory Visit (INDEPENDENT_AMBULATORY_CARE_PROVIDER_SITE_OTHER): Payer: Medicare HMO

## 2018-02-06 DIAGNOSIS — I5022 Chronic systolic (congestive) heart failure: Secondary | ICD-10-CM | POA: Diagnosis not present

## 2018-02-06 DIAGNOSIS — Z95810 Presence of automatic (implantable) cardiac defibrillator: Secondary | ICD-10-CM | POA: Diagnosis not present

## 2018-02-06 NOTE — Progress Notes (Signed)
EPIC Encounter for ICM Monitoring  Patient Name: Timothy Fritz. is a 77 y.o. male Date: 02/06/2018 Primary Care Physican: [name], MD Primary Cardiologist:McDowell Electrophysiologist: Allred Bi-V Pacing: 72% Last Weight: 195lbs    AT/AF Burden98%                                                          Transmission reviewed.    Thoracic impedance normal.   Prescribed: Furosemide 20 mg 1 tablet as needed  Labs: 11/19/2017 Creatinine1.05, BUN10, Potassium4.3, Sodium139, EGFR>60 11/17/2017 Creatinine0.86, BUN10, Potassium3.9, Sodium139, EGFR>60  11/15/2017 Creatinine0.93, BUN12, Potassium3.8, Sodium139, EGFR>60  11/14/2017 Creatinine1.15, BUN14, Potassium3.5, Sodium141, EGFR>60  11/12/2017 Creatinine0.88, BUN14, Potassium3.9, Sodium135, EGFR>60  11/11/2017 Creatinine0.97, BUN18, Potassium4.0, Sodium136, EGFR>60 A complete set of results can be found in Results Review.  Recommendations: None  Follow-up plan: ICM clinic phone appointment on 03/12/2018  Copy of ICM check sent to Dr. Rayann Heman.   3 month ICM trend: 02/05/2018    1 Year ICM trend:       [name], RN 02/06/2018 12:37 PM

## 2018-02-16 DIAGNOSIS — I10 Essential (primary) hypertension: Secondary | ICD-10-CM | POA: Diagnosis not present

## 2018-02-16 DIAGNOSIS — Z6832 Body mass index (BMI) 32.0-32.9, adult: Secondary | ICD-10-CM | POA: Diagnosis not present

## 2018-02-16 DIAGNOSIS — I5022 Chronic systolic (congestive) heart failure: Secondary | ICD-10-CM | POA: Diagnosis not present

## 2018-02-16 DIAGNOSIS — Z299 Encounter for prophylactic measures, unspecified: Secondary | ICD-10-CM | POA: Diagnosis not present

## 2018-02-16 DIAGNOSIS — K439 Ventral hernia without obstruction or gangrene: Secondary | ICD-10-CM | POA: Diagnosis not present

## 2018-02-16 DIAGNOSIS — I4891 Unspecified atrial fibrillation: Secondary | ICD-10-CM | POA: Diagnosis not present

## 2018-02-16 DIAGNOSIS — Z789 Other specified health status: Secondary | ICD-10-CM | POA: Diagnosis not present

## 2018-02-25 ENCOUNTER — Other Ambulatory Visit: Payer: Self-pay | Admitting: Cardiology

## 2018-02-28 ENCOUNTER — Telehealth: Payer: Self-pay | Admitting: Cardiology

## 2018-02-28 DIAGNOSIS — K439 Ventral hernia without obstruction or gangrene: Secondary | ICD-10-CM | POA: Diagnosis not present

## 2018-02-28 DIAGNOSIS — K429 Umbilical hernia without obstruction or gangrene: Secondary | ICD-10-CM | POA: Diagnosis not present

## 2018-02-28 MED ORDER — LOSARTAN POTASSIUM 25 MG PO TABS: 12.5000 mg | ORAL_TABLET | Freq: Every day | ORAL | 3 refills | Status: DC

## 2018-02-28 NOTE — Telephone Encounter (Signed)
°*  STAT* If patient is at the pharmacy, call can be transferred to refill team.   1. Which medications need to be refilled? (please list name of each medication and dose if known) losartan (COZAAR) 25 MG tablet     2. Which pharmacy/location (including street and city if local pharmacy) is medication to be sent to? [facility]  3. Do they need a 30 day or 90 day supply?     Patient thought he was suppose to be taking 1 pill a day.  He did not realize he was to take .5 pill everyday.  He is out of medication

## 2018-02-28 NOTE — Telephone Encounter (Signed)
Patient notified via detailed voice message - confirming that he is to only take 1/2 tab of the Losartan.  Refill sent to [facility].

## 2018-03-01 ENCOUNTER — Telehealth: Payer: Self-pay | Admitting: Cardiology

## 2018-03-01 NOTE — Telephone Encounter (Signed)
Patient says he is having a ct scan at [facility] to evaluate a hernia. Advised patient that he can have a CT scan but he should let the radiology department there know that he has a device as well. Verbalized understanding.

## 2018-03-01 NOTE — Telephone Encounter (Signed)
Patient is scheduled to have CT scan tomorrow and wanting to know if he can with his device

## 2018-03-01 NOTE — Telephone Encounter (Signed)
Bad connection during conversation Awaiting call back from patient.

## 2018-03-02 DIAGNOSIS — I700 Atherosclerosis of aorta: Secondary | ICD-10-CM | POA: Diagnosis not present

## 2018-03-02 DIAGNOSIS — K5730 Diverticulosis of large intestine without perforation or abscess without bleeding: Secondary | ICD-10-CM | POA: Diagnosis not present

## 2018-03-02 DIAGNOSIS — K8020 Calculus of gallbladder without cholecystitis without obstruction: Secondary | ICD-10-CM | POA: Diagnosis not present

## 2018-03-02 DIAGNOSIS — K449 Diaphragmatic hernia without obstruction or gangrene: Secondary | ICD-10-CM | POA: Diagnosis not present

## 2018-03-02 DIAGNOSIS — K439 Ventral hernia without obstruction or gangrene: Secondary | ICD-10-CM | POA: Diagnosis not present

## 2018-03-12 ENCOUNTER — Ambulatory Visit (INDEPENDENT_AMBULATORY_CARE_PROVIDER_SITE_OTHER): Payer: Medicare HMO

## 2018-03-12 DIAGNOSIS — I5022 Chronic systolic (congestive) heart failure: Secondary | ICD-10-CM

## 2018-03-12 DIAGNOSIS — Z95810 Presence of automatic (implantable) cardiac defibrillator: Secondary | ICD-10-CM | POA: Diagnosis not present

## 2018-03-12 NOTE — Progress Notes (Signed)
EPIC Encounter for ICM Monitoring  Patient Name: Timothy Fritz. is a 78 y.o. male Date: 03/12/2018 Primary Care Physican: [name], MD Primary Cardiologist:McDowell Electrophysiologist: Allred Bi-V Pacing: 73% Last Weight:195lbs Today's weight: 199 lbs  AT/AF Burden98%  Heart failure questions reviewed.  He is feeling fine but thinks he never returned to his baseline of physical activity after October 2019 hospitalization.    Thoracic impedance normal.   Prescribed:Furosemide 20 mg 1 tablet as needed  Labs: 11/19/2017 Creatinine1.05, BUN10, Potassium4.3, Sodium139, EGFR>60 11/17/2017 Creatinine0.86, BUN10, Potassium3.9, Sodium139, EGFR>60  11/15/2017 Creatinine0.93, BUN12, Potassium3.8, Sodium139, EGFR>60  11/14/2017 Creatinine1.15, BUN14, Potassium3.5, Sodium141, EGFR>60  11/12/2017 Creatinine0.88, BUN14, Potassium3.9, Sodium135, EGFR>60  11/11/2017 Creatinine0.97, BUN18, Potassium4.0, Sodium136, EGFR>60 A complete set of results can be found in Results Review.  Recommendations: No changes.  Follow-up plan: ICM clinic phone appointment on 04/16/2018. Office appointment scheduled with Dr Domenic Polite on 03/30/2018  Copy of ICM check sent to Dr.Allred.   3 month ICM trend: 03/12/2018    1 Year ICM trend:       [name], RN 03/12/2018 3:45 PM

## 2018-03-19 DIAGNOSIS — I4891 Unspecified atrial fibrillation: Secondary | ICD-10-CM | POA: Diagnosis not present

## 2018-03-19 DIAGNOSIS — I428 Other cardiomyopathies: Secondary | ICD-10-CM | POA: Diagnosis not present

## 2018-03-19 DIAGNOSIS — Z6832 Body mass index (BMI) 32.0-32.9, adult: Secondary | ICD-10-CM | POA: Diagnosis not present

## 2018-03-19 DIAGNOSIS — Z299 Encounter for prophylactic measures, unspecified: Secondary | ICD-10-CM | POA: Diagnosis not present

## 2018-03-19 DIAGNOSIS — I5022 Chronic systolic (congestive) heart failure: Secondary | ICD-10-CM | POA: Diagnosis not present

## 2018-03-19 DIAGNOSIS — I10 Essential (primary) hypertension: Secondary | ICD-10-CM | POA: Diagnosis not present

## 2018-03-19 DIAGNOSIS — R413 Other amnesia: Secondary | ICD-10-CM | POA: Diagnosis not present

## 2018-03-20 DIAGNOSIS — E7800 Pure hypercholesterolemia, unspecified: Secondary | ICD-10-CM | POA: Diagnosis not present

## 2018-03-20 DIAGNOSIS — I509 Heart failure, unspecified: Secondary | ICD-10-CM | POA: Diagnosis not present

## 2018-03-27 DIAGNOSIS — R413 Other amnesia: Secondary | ICD-10-CM | POA: Diagnosis not present

## 2018-03-29 NOTE — Progress Notes (Signed)
Cardiology Office Note  Date: 03/30/2018   ID: Timothy Fowler Sr., DOB 27-Feb-1940, MRN [id number]  PCP: [name], MD  Primary Cardiologist: Rozann Lesches, MD   Chief Complaint  Patient presents with  . Cardiomyopathy    History of Present Illness: Timothy Byron. is a 78 y.o. male last seen in November 2019.  He is here for a routine visit.  He does not report any worsening shortness of breath with usual activity.  Also no significant change in weight.  He is using Lasix on an as-needed basis at this time.  He is on Coumadin with follow-up per Dr. Manuella Ghazi.  He does not report any recurring bleeding problems, did have an episode of GI bleed last year as detailed in the previous note.  He does not feel any palpitations with atrial fibrillation, is on high-dose Toprol-XL for heart rate control.  This has inhibited biventricular pacing over time, recently 73%.  He sees Dr. Rayann Heman in the device clinic, [place] CRT-D in place.  Device check in January demonstrated AT/AF burden 98% with normal thoracic impedance.  He was concerned about status of battery, I checked the report from September 2019 which indicated an anticipated battery life of 22 months from that time.  Past Medical History:  Diagnosis Date  . AICD (automatic cardioverter/defibrillator) present 07/11/2012  . Aortic aneurysm, thoracic ([place])    Fusiform s/p Bentall procedure 2002  . Aortic valve, bicuspid    Mechanical AVR  . Basal cell carcinoma of face   . BPH (benign prostatic hypertrophy)   . Coronary atherosclerosis of native coronary artery    Nonobstructive at cardiac catherization 2002  . Essential hypertension, benign   . Left bundle branch block   . Mixed hyperlipidemia   . Nonischemic cardiomyopathy (HCC)    CRT-D  . Persistent atrial fibrillation   . Presence of permanent cardiac pacemaker     Past Surgical History:  Procedure Laterality Date  . BENTALL PROCEDURE  2002   Dr Cyndia Bent   .  BI-VENTRICULAR IMPLANTABLE CARDIOVERTER DEFIBRILLATOR N/A 07/12/2012   Procedure: BI-VENTRICULAR IMPLANTABLE CARDIOVERTER DEFIBRILLATOR  (CRT-D);  Surgeon: [name], MD;  Location: [facility] CATH LAB;  Service: Cardiovascular;  Laterality: N/A;  . BI-VENTRICULAR IMPLANTABLE CARDIOVERTER DEFIBRILLATOR  (CRT-D)  07/11/2012   [facility]- Dr. Rayann Heman  . CARDIAC CATHETERIZATION  2002  . CARDIAC VALVE REPLACEMENT     AVR  . CATARACT EXTRACTION Left 01/21/13  . COLONOSCOPY N/A 12/01/2016   Procedure: COLONOSCOPY;  Surgeon: Rogene Houston, MD;  [facility];  Service: Endoscopy;  Laterality: N/A;  2:00  . COLONOSCOPY WITH PROPOFOL N/A 11/08/2017   Procedure: COLONOSCOPY WITH PROPOFOL;  Surgeon: Ronnette Juniper, MD;  Location: [place];  Service: Gastroenterology;  Laterality: N/A;  . ESOPHAGOGASTRODUODENOSCOPY (EGD) WITH PROPOFOL N/A 11/08/2017   Procedure: ESOPHAGOGASTRODUODENOSCOPY (EGD) WITH PROPOFOL Needs INR<2;  Surgeon: Ronnette Juniper, MD;  Location: [place];  Service: Gastroenterology;  Laterality: N/A;  . HERNIA REPAIR    . IR ANGIOGRAM SELECTIVE EACH ADDITIONAL VESSEL  11/10/2017  . IR ANGIOGRAM VISCERAL SELECTIVE  11/10/2017  . IR ANGIOGRAM VISCERAL SELECTIVE  11/10/2017  . IR ANGIOGRAM VISCERAL SELECTIVE  11/10/2017  . IR EMBO ART  VEN HEMORR LYMPH EXTRAV  INC GUIDE ROADMAPPING  11/10/2017  . IR US GUIDE VASC ACCESS RIGHT  11/10/2017  . POLYPECTOMY  11/08/2017   Procedure: POLYPECTOMY;  Surgeon: Ronnette Juniper, MD;  Location: [place];  Service: Gastroenterology;;  .  TONSILLECTOMY AND ADENOIDECTOMY  1949  . UMBILICAL HERNIA REPAIR  09/2017    Current Outpatient Medications  Medication Sig Dispense Refill  . acetaminophen (TYLENOL) 500 MG tablet Take 1,000 mg by mouth every 6 (six) hours as needed for headache (pain).    Marland Kitchen atorvastatin (LIPITOR) 10 MG tablet Take 10 mg by mouth daily at 6 PM.     . cholecalciferol (VITAMIN D) 1000 units tablet Take 1,000 Units by  mouth at bedtime.     . enoxaparin (LOVENOX) 80 MG/0.8ML injection Inject 0.8 mLs (80 mg total) into the skin every 12 (twelve) hours for 14 days. 22.4 mL 0  . ferrous sulfate 325 (65 FE) MG tablet Take 1 tablet (325 mg total) by mouth every other day. 15 tablet 0  . furosemide (LASIX) 20 MG tablet Take 20 mg by mouth daily.     Marland Kitchen losartan (COZAAR) 25 MG tablet Take 0.5 tablets (12.5 mg total) by mouth at bedtime. 45 tablet 3  . meclizine (ANTIVERT) 25 MG tablet Take 25 mg by mouth 3 (three) times daily as needed for dizziness.    . metoprolol succinate (TOPROL-XL) 100 MG 24 hr tablet TAKE 1 AND 1/2 TABLETS BY MOUTH TWICE DAILY 270 tablet 1  . metroNIDAZOLE (METROCREAM) 0.75 % cream Apply 1 application topically daily as needed (rosacea).    . Omega-3 Fatty Acids (FISH OIL) 1000 MG CAPS Take 1,000 mg by mouth daily.     Marland Kitchen oxymetazoline (AFRIN) 0.05 % nasal spray Place 1 spray into both nostrils daily as needed for congestion.    . pantoprazole (PROTONIX) 40 MG tablet Take 1 tablet (40 mg total) by mouth 2 (two) times daily before a meal. (Patient taking differently: Take 40 mg by mouth daily. ) 60 tablet 0  . phenylephrine (NEO-SYNEPHRINE) 1 % nasal spray Place 1 drop into both nostrils daily as needed for congestion.    . Tamsulosin HCl (FLOMAX) 0.4 MG CAPS Take 0.4 mg by mouth at bedtime.     . vitamin C (ASCORBIC ACID) 500 MG tablet Take 500 mg by mouth daily.    Marland Kitchen warfarin (COUMADIN) 5 MG tablet Take 5 mg by mouth at bedtime.      No current facility-administered medications for this visit.    Allergies:  Patient has no known allergies.   Social History: The patient  reports that he quit smoking about 18 years ago. His smoking use included cigarettes. He has a 41.00 pack-year smoking history. He has quit using smokeless tobacco.  His smokeless tobacco use included snuff and chew. He reports current alcohol use. He reports that he does not use drugs.   ROS:  Please see the history of present  illness. Otherwise, complete review of systems is positive for trouble with memory.  All other systems are reviewed and negative.   Physical Exam: VS:  BP 122/78   Pulse 80   Ht 5\' 8"  (1.727 m)   Wt 209 lb (94.8 kg)   SpO2 97%   BMI 31.78 kg/m , BMI Body mass index is 31.78 kg/m.  Wt Readings from Last 3 Encounters:  03/30/18 209 lb (94.8 kg)  12/25/17 204 lb 6.4 oz (92.7 kg)  11/18/17 194 lb 4.8 oz (88.1 kg)    General: Patient appears comfortable at rest. HEENT: Conjunctiva and lids normal, oropharynx clear. Neck: Supple, no elevated JVP or carotid bruits, no thyromegaly. Lungs: Clear to auscultation, nonlabored breathing at rest. Cardiac: Regular rate and rhythm, no S3, crisp prosthetic click.  Abdomen: Protuberant, nontender, bowel sounds present. Extremities: No pitting edema, distal pulses 2+. Skin: Warm and dry. Musculoskeletal: No kyphosis. Neuropsychiatric: Alert and oriented x3, affect grossly appropriate.  ECG: I personally reviewed the tracing from 11/19/2017 which showed intermittent dual-chamber pacing with PVCs.  Recent Labwork: 11/05/2017: Magnesium 2.0 11/19/2017: ALT 31; AST 34; BUN 10; Creatinine, Ser 1.05; Hemoglobin 10.3; Platelets 206; Potassium 4.3; Sodium 139  December 2019: BUN 16, creatinine 0.97, potassium 4.6  Other Studies Reviewed Today:  Echocardiogram 11/06/2017: Study Conclusions  - Left ventricle: The cavity size was normal. Systolic function was mildly to moderately reduced. The estimated ejection fraction was in the range of 40% to 45%. - Aortic valve: Mildly calcified annulus. Moderately thickened, moderately calcified leaflets. There was moderate stenosis. Valve area (VTI): 1.59 cm^2. Valve area (Vmax): 1.61 cm^2. Valve area (Vmean): 1.34 cm^2. - Mitral valve: There was mild regurgitation. - Left atrium: The atrium was moderately dilated. - Right atrium: The atrium was mildly dilated. - Pulmonary arteries: Systolic  pressure was mildly increased. PA peak pressure: 34 mm Hg (S).  Assessment and Plan:  1.  Nonischemic cardiomyopathy with LVEF 40 to 45%.  He continues on Toprol-XL and losartan, follow-up lab work from December reviewed above.  We will plan a follow-up echocardiogram around the time of his next clinical visit.  2.  [facility] CRT-D in place, followed by Dr. Rayann Heman.  He has had intermittently suboptimal biventricular pacing in the setting of atrial fibrillation despite high-dose beta-blocker.  Wonder whether he might be a candidate for AV node ablation at some point.  3.  Aortic valve disease status post Bentall procedure with mechanical AVR.  He continues on Coumadin followed by Dr. Manuella Ghazi.  No recent bleeding problems, GI bleed noted last year.  4.  Permanent atrial fibrillation/flutter.  He is on Coumadin.  Toprol XL currently at 150 mg twice daily.  Current medicines were reviewed with the patient today.  Disposition: Follow-up in 6 months.  Signed, Timothy Sark, MD, [facility] 03/30/2018 1:29 PM    [place] at [place], [place], [place] [address] Phone: [phone]; Fax: [phone]

## 2018-03-30 ENCOUNTER — Ambulatory Visit: Payer: Medicare HMO | Admitting: Cardiology

## 2018-03-30 ENCOUNTER — Encounter: Payer: Self-pay | Admitting: Cardiology

## 2018-03-30 VITALS — BP 122/78 | HR 80 | Ht 68.0 in | Wt 209.0 lb

## 2018-03-30 DIAGNOSIS — Z95810 Presence of automatic (implantable) cardiac defibrillator: Secondary | ICD-10-CM

## 2018-03-30 DIAGNOSIS — I4821 Permanent atrial fibrillation: Secondary | ICD-10-CM

## 2018-03-30 DIAGNOSIS — I428 Other cardiomyopathies: Secondary | ICD-10-CM | POA: Diagnosis not present

## 2018-03-30 DIAGNOSIS — Z954 Presence of other heart-valve replacement: Secondary | ICD-10-CM

## 2018-03-30 NOTE — Patient Instructions (Signed)
Medication Instructions:  Continue all current medications.  Labwork:   Testing/Procedures:   Follow-Up: Your physician wants you to follow up in: 6 months.  You will receive a reminder letter in the mail one-two months in advance.  If you don't receive a letter, please call our office to schedule the follow up appointment   Any Other Special Instructions Will Be Listed Below (If Applicable).  If you need a refill on your cardiac medications before your next appointment, please call your pharmacy.  

## 2018-04-06 DIAGNOSIS — Z6832 Body mass index (BMI) 32.0-32.9, adult: Secondary | ICD-10-CM | POA: Diagnosis not present

## 2018-04-06 DIAGNOSIS — I4891 Unspecified atrial fibrillation: Secondary | ICD-10-CM | POA: Diagnosis not present

## 2018-04-06 DIAGNOSIS — I509 Heart failure, unspecified: Secondary | ICD-10-CM | POA: Diagnosis not present

## 2018-04-06 DIAGNOSIS — Z299 Encounter for prophylactic measures, unspecified: Secondary | ICD-10-CM | POA: Diagnosis not present

## 2018-04-06 DIAGNOSIS — I10 Essential (primary) hypertension: Secondary | ICD-10-CM | POA: Diagnosis not present

## 2018-04-06 DIAGNOSIS — R413 Other amnesia: Secondary | ICD-10-CM | POA: Diagnosis not present

## 2018-04-16 ENCOUNTER — Ambulatory Visit (INDEPENDENT_AMBULATORY_CARE_PROVIDER_SITE_OTHER): Payer: Medicare HMO

## 2018-04-16 DIAGNOSIS — I5042 Chronic combined systolic (congestive) and diastolic (congestive) heart failure: Secondary | ICD-10-CM | POA: Diagnosis not present

## 2018-04-16 DIAGNOSIS — Z95810 Presence of automatic (implantable) cardiac defibrillator: Secondary | ICD-10-CM | POA: Diagnosis not present

## 2018-04-17 DIAGNOSIS — I4891 Unspecified atrial fibrillation: Secondary | ICD-10-CM | POA: Diagnosis not present

## 2018-04-17 DIAGNOSIS — I10 Essential (primary) hypertension: Secondary | ICD-10-CM | POA: Diagnosis not present

## 2018-04-17 DIAGNOSIS — Z299 Encounter for prophylactic measures, unspecified: Secondary | ICD-10-CM | POA: Diagnosis not present

## 2018-04-17 DIAGNOSIS — I509 Heart failure, unspecified: Secondary | ICD-10-CM | POA: Diagnosis not present

## 2018-04-17 DIAGNOSIS — Z6832 Body mass index (BMI) 32.0-32.9, adult: Secondary | ICD-10-CM | POA: Diagnosis not present

## 2018-04-17 NOTE — Progress Notes (Signed)
EPIC Encounter for ICM Monitoring  Patient Name: Timothy Fritz. is a 78 y.o. male Date: 04/17/2018 Primary Care Physican: [name], MD Primary Cardiologist:[facility]Dowell Electrophysiologist: Allred Bi-V Pacing: 75% Last Weight:199lbs Today's weight: unknown  AT/AF Burden99%  Attempted call to patient and unable to reach.  Left message to return call. Transmission reviewed.     Thoracic impedance normal.   Prescribed:Furosemide 20 mg 1 tablet daily.  Labs: 01/16/2018 Creatinine 0.97, BUN 16, Potassium 4.6, Sodium 138, GFR >60 11/19/2017 Creatinine1.05, BUN10, Potassium4.3, Sodium139, EGFR>60 11/17/2017 Creatinine0.86, BUN10, Potassium3.9, Sodium139, EGFR>60  11/15/2017 Creatinine0.93, BUN12, Potassium3.8, Sodium139, EGFR>60  11/14/2017 Creatinine1.15, BUN14, Potassium3.5, Sodium141, EGFR>60  11/12/2017 Creatinine0.88, BUN14, Potassium3.9, Sodium135, EGFR>60  11/11/2017 Creatinine0.97, BUN18, Potassium4.0, Sodium136, EGFR>60 A complete set of results can be found in Results Review.  Recommendations: Unable to reach..  Follow-up plan: ICM clinic phone appointment on 05/21/2018.    Copy of ICM check sent to Dr. Rayann Heman.   3 month ICM trend: 04/16/2018    1 Year ICM trend:       [name], RN 04/17/2018 12:38 PM

## 2018-04-17 NOTE — Progress Notes (Signed)
Patient returned call.  He reported doing well.  He takes Lasix PRN and took a couple of dosages about a week ago due to ring tends to get tight on his finger.  Transmission reviewed.  Current weight 202 lbs. No complaints today.  Advised to call for any fluid symptoms.  Next ICM remote transmission 05/21/2018.

## 2018-04-23 DIAGNOSIS — K439 Ventral hernia without obstruction or gangrene: Secondary | ICD-10-CM | POA: Diagnosis not present

## 2018-04-23 DIAGNOSIS — K429 Umbilical hernia without obstruction or gangrene: Secondary | ICD-10-CM | POA: Diagnosis not present

## 2018-04-27 DIAGNOSIS — Z299 Encounter for prophylactic measures, unspecified: Secondary | ICD-10-CM | POA: Diagnosis not present

## 2018-04-27 DIAGNOSIS — Z6833 Body mass index (BMI) 33.0-33.9, adult: Secondary | ICD-10-CM | POA: Diagnosis not present

## 2018-04-27 DIAGNOSIS — I509 Heart failure, unspecified: Secondary | ICD-10-CM | POA: Diagnosis not present

## 2018-04-27 DIAGNOSIS — I10 Essential (primary) hypertension: Secondary | ICD-10-CM | POA: Diagnosis not present

## 2018-04-27 DIAGNOSIS — Z87891 Personal history of nicotine dependence: Secondary | ICD-10-CM | POA: Diagnosis not present

## 2018-04-27 DIAGNOSIS — I4891 Unspecified atrial fibrillation: Secondary | ICD-10-CM | POA: Diagnosis not present

## 2018-05-14 DIAGNOSIS — E7800 Pure hypercholesterolemia, unspecified: Secondary | ICD-10-CM | POA: Diagnosis not present

## 2018-05-14 DIAGNOSIS — I509 Heart failure, unspecified: Secondary | ICD-10-CM | POA: Diagnosis not present

## 2018-05-18 DIAGNOSIS — I4891 Unspecified atrial fibrillation: Secondary | ICD-10-CM | POA: Diagnosis not present

## 2018-05-18 DIAGNOSIS — I428 Other cardiomyopathies: Secondary | ICD-10-CM | POA: Diagnosis not present

## 2018-05-18 DIAGNOSIS — I5022 Chronic systolic (congestive) heart failure: Secondary | ICD-10-CM | POA: Diagnosis not present

## 2018-05-18 DIAGNOSIS — Z6832 Body mass index (BMI) 32.0-32.9, adult: Secondary | ICD-10-CM | POA: Diagnosis not present

## 2018-05-18 DIAGNOSIS — Z299 Encounter for prophylactic measures, unspecified: Secondary | ICD-10-CM | POA: Diagnosis not present

## 2018-05-18 DIAGNOSIS — I10 Essential (primary) hypertension: Secondary | ICD-10-CM | POA: Diagnosis not present

## 2018-05-21 ENCOUNTER — Other Ambulatory Visit: Payer: Self-pay

## 2018-05-21 ENCOUNTER — Ambulatory Visit (INDEPENDENT_AMBULATORY_CARE_PROVIDER_SITE_OTHER): Payer: Medicare HMO

## 2018-05-21 DIAGNOSIS — Z95810 Presence of automatic (implantable) cardiac defibrillator: Secondary | ICD-10-CM

## 2018-05-21 DIAGNOSIS — I5042 Chronic combined systolic (congestive) and diastolic (congestive) heart failure: Secondary | ICD-10-CM

## 2018-05-23 ENCOUNTER — Telehealth: Payer: Self-pay

## 2018-05-23 NOTE — Progress Notes (Signed)
EPIC Encounter for ICM Monitoring  Patient Name: Timothy Fritz. is a 78 y.o. male Date: 05/23/2018 Primary Care Physican: [name], MD Primary Cardiologist:McDowell Electrophysiologist: Allred Bi-V Pacing: 76% 04/17/2018 Weight:202lbs Today's weight: unknown  AT/AF Burden99%  Attempted call to patient and unable to reach.  Left message to return call. Transmission reviewed.    Thoracic impedance normal.   Prescribed:Furosemide 20 mg 1 tablet daily.  Labs: 01/16/2018 Creatinine 0.97, BUN 16, Potassium 4.6, Sodium 138, GFR >60 11/19/2017 Creatinine1.05, BUN10, Potassium4.3, Sodium139, EGFR>60 11/17/2017 Creatinine0.86, BUN10, Potassium3.9, Sodium139, EGFR>60  11/15/2017 Creatinine0.93, BUN12, Potassium3.8, Sodium139, EGFR>60  11/14/2017 Creatinine1.15, BUN14, Potassium3.5, Sodium141, EGFR>60  11/12/2017 Creatinine0.88, BUN14, Potassium3.9, Sodium135, EGFR>60  11/11/2017 Creatinine0.97, BUN18, Potassium4.0, Sodium136, EGFR>60 A complete set of results can be found in Results Review.  Recommendations: Unable to reach.  Follow-up plan: ICM clinic phone appointment on5/12/2018.    Copy of ICM check sent to Dr. Rayann Heman.   3 month ICM trend: 05/21/2018    1 Year ICM trend:       [name], RN 05/23/2018 9:45 AM

## 2018-05-23 NOTE — Telephone Encounter (Signed)
Remote ICM transmission received.  Attempted call to patient regarding ICM remote transmission and left detailed message, per DPR, with next ICM remote transmission date of 06/25/2018.  Advised to return call for any fluid symptoms or questions.

## 2018-06-04 DIAGNOSIS — Z6832 Body mass index (BMI) 32.0-32.9, adult: Secondary | ICD-10-CM | POA: Diagnosis not present

## 2018-06-04 DIAGNOSIS — I10 Essential (primary) hypertension: Secondary | ICD-10-CM | POA: Diagnosis not present

## 2018-06-04 DIAGNOSIS — I509 Heart failure, unspecified: Secondary | ICD-10-CM | POA: Diagnosis not present

## 2018-06-04 DIAGNOSIS — Z299 Encounter for prophylactic measures, unspecified: Secondary | ICD-10-CM | POA: Diagnosis not present

## 2018-06-04 DIAGNOSIS — I4891 Unspecified atrial fibrillation: Secondary | ICD-10-CM | POA: Diagnosis not present

## 2018-06-11 DIAGNOSIS — E7800 Pure hypercholesterolemia, unspecified: Secondary | ICD-10-CM | POA: Diagnosis not present

## 2018-06-11 DIAGNOSIS — I509 Heart failure, unspecified: Secondary | ICD-10-CM | POA: Diagnosis not present

## 2018-06-25 ENCOUNTER — Other Ambulatory Visit: Payer: Self-pay

## 2018-06-25 ENCOUNTER — Ambulatory Visit (INDEPENDENT_AMBULATORY_CARE_PROVIDER_SITE_OTHER): Payer: Medicare HMO

## 2018-06-25 DIAGNOSIS — I5042 Chronic combined systolic (congestive) and diastolic (congestive) heart failure: Secondary | ICD-10-CM

## 2018-06-25 DIAGNOSIS — Z95810 Presence of automatic (implantable) cardiac defibrillator: Secondary | ICD-10-CM

## 2018-06-26 ENCOUNTER — Ambulatory Visit (INDEPENDENT_AMBULATORY_CARE_PROVIDER_SITE_OTHER): Payer: Medicare HMO | Admitting: *Deleted

## 2018-06-26 ENCOUNTER — Other Ambulatory Visit: Payer: Self-pay

## 2018-06-26 DIAGNOSIS — I428 Other cardiomyopathies: Secondary | ICD-10-CM

## 2018-06-26 DIAGNOSIS — I5042 Chronic combined systolic (congestive) and diastolic (congestive) heart failure: Secondary | ICD-10-CM | POA: Diagnosis not present

## 2018-06-27 DIAGNOSIS — D62 Acute posthemorrhagic anemia: Secondary | ICD-10-CM | POA: Diagnosis not present

## 2018-06-27 DIAGNOSIS — I509 Heart failure, unspecified: Secondary | ICD-10-CM | POA: Diagnosis not present

## 2018-06-27 DIAGNOSIS — K439 Ventral hernia without obstruction or gangrene: Secondary | ICD-10-CM | POA: Diagnosis not present

## 2018-06-27 DIAGNOSIS — K8020 Calculus of gallbladder without cholecystitis without obstruction: Secondary | ICD-10-CM | POA: Diagnosis not present

## 2018-06-27 DIAGNOSIS — Z7901 Long term (current) use of anticoagulants: Secondary | ICD-10-CM | POA: Diagnosis not present

## 2018-06-27 DIAGNOSIS — K219 Gastro-esophageal reflux disease without esophagitis: Secondary | ICD-10-CM | POA: Diagnosis not present

## 2018-06-27 DIAGNOSIS — I5021 Acute systolic (congestive) heart failure: Secondary | ICD-10-CM | POA: Diagnosis not present

## 2018-06-27 DIAGNOSIS — Z1159 Encounter for screening for other viral diseases: Secondary | ICD-10-CM | POA: Diagnosis not present

## 2018-06-27 DIAGNOSIS — D500 Iron deficiency anemia secondary to blood loss (chronic): Secondary | ICD-10-CM | POA: Diagnosis not present

## 2018-06-27 DIAGNOSIS — L7632 Postprocedural hematoma of skin and subcutaneous tissue following other procedure: Secondary | ICD-10-CM | POA: Diagnosis not present

## 2018-06-27 DIAGNOSIS — I4821 Permanent atrial fibrillation: Secondary | ICD-10-CM | POA: Diagnosis not present

## 2018-06-27 DIAGNOSIS — J9811 Atelectasis: Secondary | ICD-10-CM | POA: Diagnosis not present

## 2018-06-27 DIAGNOSIS — K432 Incisional hernia without obstruction or gangrene: Secondary | ICD-10-CM | POA: Diagnosis not present

## 2018-06-27 DIAGNOSIS — I428 Other cardiomyopathies: Secondary | ICD-10-CM | POA: Diagnosis not present

## 2018-06-27 DIAGNOSIS — N179 Acute kidney failure, unspecified: Secondary | ICD-10-CM | POA: Diagnosis not present

## 2018-06-27 DIAGNOSIS — Z952 Presence of prosthetic heart valve: Secondary | ICD-10-CM | POA: Diagnosis not present

## 2018-06-27 DIAGNOSIS — Z4682 Encounter for fitting and adjustment of non-vascular catheter: Secondary | ICD-10-CM | POA: Diagnosis not present

## 2018-06-27 DIAGNOSIS — K567 Ileus, unspecified: Secondary | ICD-10-CM | POA: Diagnosis not present

## 2018-06-27 LAB — CUP PACEART REMOTE DEVICE CHECK
Date Time Interrogation Session: 20200513132509
Implantable Lead Implant Date: 20140529
Implantable Lead Implant Date: 20140529
Implantable Lead Implant Date: 20140529
Implantable Lead Location: 753858
Implantable Lead Location: 753859
Implantable Lead Location: 753860
Implantable Pulse Generator Implant Date: 20140529
Pulse Gen Serial Number: 7097390

## 2018-06-29 DIAGNOSIS — K432 Incisional hernia without obstruction or gangrene: Secondary | ICD-10-CM | POA: Diagnosis not present

## 2018-06-29 NOTE — Progress Notes (Signed)
EPIC Encounter for ICM Monitoring  Patient Name: Timothy Fritz. is a 78 y.o. male Date: 06/29/2018 Primary Care Physican: [name], MD Primary Cardiologist:McDowell Electrophysiologist: Allred Bi-V Pacing: 78% 04/17/2018 Weight:202lbs  AT/AF Burden>99%  Transmission reviewed.   Thoracic impedance normal.   Prescribed:Furosemide 20 mg 1 tablet daily.  Patient takes differently:  He takes Furosemide 20 mg 1 tablet PRN.  Labs: 01/16/2018 Creatinine 0.97, BUN 16, Potassium 4.6, Sodium 138, GFR >60 11/19/2017 Creatinine1.05, BUN10, Potassium4.3, Sodium139, EGFR>60 11/17/2017 Creatinine0.86, BUN10, Potassium3.9, Sodium139, EGFR>60  11/15/2017 Creatinine0.93, BUN12, Potassium3.8, Sodium139, EGFR>60  11/14/2017 Creatinine1.15, BUN14, Potassium3.5, Sodium141, EGFR>60  11/12/2017 Creatinine0.88, BUN14, Potassium3.9, Sodium135, EGFR>60  11/11/2017 Creatinine0.97, BUN18, Potassium4.0, Sodium136, EGFR>60 A complete set of results can be found in Results Review.  Recommendations: None.  Follow-up plan: ICM clinic phone appointment on6/15/2020.  Copy of ICM check sent to Dr.Allred.   3 month ICM trend: 06/25/2018    1 Year ICM trend:       [name], RN 06/29/2018 9:17 AM

## 2018-07-11 DIAGNOSIS — I5022 Chronic systolic (congestive) heart failure: Secondary | ICD-10-CM | POA: Diagnosis not present

## 2018-07-11 DIAGNOSIS — I10 Essential (primary) hypertension: Secondary | ICD-10-CM | POA: Diagnosis not present

## 2018-07-11 DIAGNOSIS — Z299 Encounter for prophylactic measures, unspecified: Secondary | ICD-10-CM | POA: Diagnosis not present

## 2018-07-11 DIAGNOSIS — Z6832 Body mass index (BMI) 32.0-32.9, adult: Secondary | ICD-10-CM | POA: Diagnosis not present

## 2018-07-11 DIAGNOSIS — R319 Hematuria, unspecified: Secondary | ICD-10-CM | POA: Diagnosis not present

## 2018-07-13 DIAGNOSIS — Z6832 Body mass index (BMI) 32.0-32.9, adult: Secondary | ICD-10-CM | POA: Diagnosis not present

## 2018-07-13 DIAGNOSIS — R17 Unspecified jaundice: Secondary | ICD-10-CM | POA: Diagnosis not present

## 2018-07-13 DIAGNOSIS — K9189 Other postprocedural complications and disorders of digestive system: Secondary | ICD-10-CM | POA: Diagnosis not present

## 2018-07-13 DIAGNOSIS — Z299 Encounter for prophylactic measures, unspecified: Secondary | ICD-10-CM | POA: Diagnosis not present

## 2018-07-13 DIAGNOSIS — I10 Essential (primary) hypertension: Secondary | ICD-10-CM | POA: Diagnosis not present

## 2018-07-13 DIAGNOSIS — I509 Heart failure, unspecified: Secondary | ICD-10-CM | POA: Diagnosis not present

## 2018-07-13 DIAGNOSIS — I428 Other cardiomyopathies: Secondary | ICD-10-CM | POA: Diagnosis not present

## 2018-07-13 NOTE — Progress Notes (Signed)
Remote ICD transmission.   

## 2018-07-16 DIAGNOSIS — K9189 Other postprocedural complications and disorders of digestive system: Secondary | ICD-10-CM | POA: Diagnosis not present

## 2018-07-27 DIAGNOSIS — I5022 Chronic systolic (congestive) heart failure: Secondary | ICD-10-CM | POA: Diagnosis not present

## 2018-07-27 DIAGNOSIS — I10 Essential (primary) hypertension: Secondary | ICD-10-CM | POA: Diagnosis not present

## 2018-07-27 DIAGNOSIS — I4891 Unspecified atrial fibrillation: Secondary | ICD-10-CM | POA: Diagnosis not present

## 2018-07-27 DIAGNOSIS — Z6831 Body mass index (BMI) 31.0-31.9, adult: Secondary | ICD-10-CM | POA: Diagnosis not present

## 2018-07-27 DIAGNOSIS — Z299 Encounter for prophylactic measures, unspecified: Secondary | ICD-10-CM | POA: Diagnosis not present

## 2018-07-27 DIAGNOSIS — I509 Heart failure, unspecified: Secondary | ICD-10-CM | POA: Diagnosis not present

## 2018-07-30 ENCOUNTER — Ambulatory Visit (INDEPENDENT_AMBULATORY_CARE_PROVIDER_SITE_OTHER): Payer: Medicare HMO

## 2018-07-30 DIAGNOSIS — I5042 Chronic combined systolic (congestive) and diastolic (congestive) heart failure: Secondary | ICD-10-CM | POA: Diagnosis not present

## 2018-07-30 DIAGNOSIS — Z95810 Presence of automatic (implantable) cardiac defibrillator: Secondary | ICD-10-CM | POA: Diagnosis not present

## 2018-08-01 NOTE — Progress Notes (Signed)
EPIC Encounter for ICM Monitoring  Patient Name: Timothy Fritz. is a 78 y.o. male Date: 08/01/2018 Primary Care Physican: [name], MD Primary Cardiologist:McDowell Electrophysiologist: Allred Bi-V Pacing: 75% 6/17/2020Weight:195lbs  AT/AF Burden>99%  Spoke with patient.    [place] thoracic impedance normal.   Prescribed:Furosemide 20 mg 1 tablet daily.  Patient takes differently:  He takes Furosemide 20 mg 1 tablet PRN.  Labs: 01/16/2018 Creatinine 0.97, BUN 16, Potassium 4.6, Sodium 138, GFR >60 11/19/2017 Creatinine1.05, BUN10, Potassium4.3, Sodium139, EGFR>60 A complete set of results can be found in Results Review.  Recommendations: No changes and encouraged to call if experiencing fluid symptoms.  Follow-up plan: ICM clinic phone appointment on 09/03/2018.  Copy of ICM check sent to Dr.Allred.  3 month ICM trend: 07/30/2018    1 Year ICM trend:       [name], RN 08/01/2018 8:08 AM

## 2018-08-27 DIAGNOSIS — I4891 Unspecified atrial fibrillation: Secondary | ICD-10-CM | POA: Diagnosis not present

## 2018-08-27 DIAGNOSIS — I5022 Chronic systolic (congestive) heart failure: Secondary | ICD-10-CM | POA: Diagnosis not present

## 2018-08-27 DIAGNOSIS — I10 Essential (primary) hypertension: Secondary | ICD-10-CM | POA: Diagnosis not present

## 2018-08-27 DIAGNOSIS — Z299 Encounter for prophylactic measures, unspecified: Secondary | ICD-10-CM | POA: Diagnosis not present

## 2018-08-27 DIAGNOSIS — Z6831 Body mass index (BMI) 31.0-31.9, adult: Secondary | ICD-10-CM | POA: Diagnosis not present

## 2018-08-31 ENCOUNTER — Other Ambulatory Visit: Payer: Self-pay | Admitting: Cardiology

## 2018-09-03 ENCOUNTER — Ambulatory Visit (INDEPENDENT_AMBULATORY_CARE_PROVIDER_SITE_OTHER): Payer: Medicare HMO

## 2018-09-03 DIAGNOSIS — I5042 Chronic combined systolic (congestive) and diastolic (congestive) heart failure: Secondary | ICD-10-CM | POA: Diagnosis not present

## 2018-09-03 DIAGNOSIS — Z95810 Presence of automatic (implantable) cardiac defibrillator: Secondary | ICD-10-CM | POA: Diagnosis not present

## 2018-09-07 NOTE — Progress Notes (Signed)
EPIC Encounter for ICM Monitoring  Patient Name: Timothy Fritz. is a 78 y.o. male Date: 09/07/2018 Primary Care Physican: [name], MD Primary Cardiologist:McDowell Electrophysiologist: Allred Bi-V Pacing: 75% 6/17/2020Weight:195lbs  AT/AF Burden>99%  Transmission Reviewed.    [place] thoracic impedance normal but was suggestive of possible fluid accumulation 7/10 - 7/16.  Prescribed:Furosemide 20 mg 1 tablet daily.Patient takes differently: He takes Furosemide 20 mg 1 tablet PRN.  Labs: 01/16/2018 Creatinine 0.97, BUN 16, Potassium 4.6, Sodium 138, GFR >60 11/19/2017 Creatinine1.05, BUN10, Potassium4.3, Sodium139, EGFR>60 A complete set of results can be found in Results Review.  Recommendations: None  Follow-up plan: ICM clinic phone appointment on 10/09/2018.OV with Dr Domenic Polite 10/10/2018 and Dr Rayann Heman 10/19/2018.  Copy of ICM check sent to Dr.Allred.   3 month ICM trend: 09/03/2018    1 Year ICM trend:       [name], RN 09/07/2018 11:08 AM

## 2018-09-14 DIAGNOSIS — E7800 Pure hypercholesterolemia, unspecified: Secondary | ICD-10-CM | POA: Diagnosis not present

## 2018-09-14 DIAGNOSIS — I509 Heart failure, unspecified: Secondary | ICD-10-CM | POA: Diagnosis not present

## 2018-09-21 ENCOUNTER — Encounter: Payer: Medicare HMO | Admitting: Internal Medicine

## 2018-09-25 ENCOUNTER — Ambulatory Visit: Payer: Medicare HMO | Admitting: *Deleted

## 2018-09-27 DIAGNOSIS — I10 Essential (primary) hypertension: Secondary | ICD-10-CM | POA: Diagnosis not present

## 2018-09-27 DIAGNOSIS — Z299 Encounter for prophylactic measures, unspecified: Secondary | ICD-10-CM | POA: Diagnosis not present

## 2018-09-27 DIAGNOSIS — Z6831 Body mass index (BMI) 31.0-31.9, adult: Secondary | ICD-10-CM | POA: Diagnosis not present

## 2018-09-27 DIAGNOSIS — K219 Gastro-esophageal reflux disease without esophagitis: Secondary | ICD-10-CM | POA: Diagnosis not present

## 2018-09-27 DIAGNOSIS — I5022 Chronic systolic (congestive) heart failure: Secondary | ICD-10-CM | POA: Diagnosis not present

## 2018-09-27 DIAGNOSIS — I428 Other cardiomyopathies: Secondary | ICD-10-CM | POA: Diagnosis not present

## 2018-09-27 DIAGNOSIS — I4891 Unspecified atrial fibrillation: Secondary | ICD-10-CM | POA: Diagnosis not present

## 2018-10-03 ENCOUNTER — Telehealth: Payer: Self-pay | Admitting: Cardiology

## 2018-10-03 LAB — CUP PACEART REMOTE DEVICE CHECK
Battery Remaining Longevity: 11 mo
Battery Remaining Percentage: 14 %
Battery Voltage: 2.72 V
Brady Statistic AP VP Percent: 66 %
Brady Statistic AP VS Percent: 3.9 %
Brady Statistic AS VP Percent: 21 %
Brady Statistic AS VS Percent: 8.3 %
Brady Statistic RA Percent Paced: 14 %
Date Time Interrogation Session: 20200811081910
HighPow Impedance: 65 Ohm
HighPow Impedance: 65 Ohm
Implantable Lead Implant Date: 20140529
Implantable Lead Implant Date: 20140529
Implantable Lead Implant Date: 20140529
Implantable Lead Location: 753858
Implantable Lead Location: 753859
Implantable Lead Location: 753860
Implantable Pulse Generator Implant Date: 20140529
Lead Channel Impedance Value: 360 Ohm
Lead Channel Impedance Value: 390 Ohm
Lead Channel Impedance Value: 650 Ohm
Lead Channel Pacing Threshold Amplitude: 0.75 V
Lead Channel Pacing Threshold Amplitude: 1 V
Lead Channel Pacing Threshold Amplitude: 1.125 V
Lead Channel Pacing Threshold Pulse Width: 0.5 ms
Lead Channel Pacing Threshold Pulse Width: 0.5 ms
Lead Channel Pacing Threshold Pulse Width: 0.5 ms
Lead Channel Sensing Intrinsic Amplitude: 11.7 mV
Lead Channel Sensing Intrinsic Amplitude: 4.1 mV
Lead Channel Setting Pacing Amplitude: 2 V
Lead Channel Setting Pacing Amplitude: 2 V
Lead Channel Setting Pacing Amplitude: 2.125
Lead Channel Setting Pacing Pulse Width: 0.5 ms
Lead Channel Setting Pacing Pulse Width: 0.5 ms
Lead Channel Setting Sensing Sensitivity: 0.5 mV
Pulse Gen Serial Number: 7097390

## 2018-10-03 NOTE — Telephone Encounter (Signed)
Virtual Visit Pre-Appointment Phone Call  "(Name), I am calling you today to discuss your upcoming appointment. We are currently trying to limit exposure to the virus that causes COVID-19 by seeing patients at home rather than in the office."  1. "What is the BEST phone number to call the day of the visit?" - include this in appointment notes  2. Do you have or have access to (through a family member/friend) a smartphone with video capability that we can use for your visit?" a. If yes - list this number in appt notes as cell (if different from BEST phone #) and list the appointment type as a VIDEO visit in appointment notes b. If no - list the appointment type as a PHONE visit in appointment notes  3. Confirm consent - "In the setting of the current Covid19 crisis, you are scheduled for a (phone or video) visit with your provider on (date) at (time).  Just as we do with many in-office visits, in order for you to participate in this visit, we must obtain consent.  If you'd like, I can send this to your mychart (if signed up) or email for you to review.  Otherwise, I can obtain your verbal consent now.  All virtual visits are billed to your insurance company just like a normal visit would be.  By agreeing to a virtual visit, we'd like you to understand that the technology does not allow for your provider to perform an examination, and thus may limit your provider's ability to fully assess your condition. If your provider identifies any concerns that need to be evaluated in person, we will make arrangements to do so.  Finally, though the technology is pretty good, we cannot assure that it will always work on either your or our end, and in the setting of a video visit, we may have to convert it to a phone-only visit.  In either situation, we cannot ensure that we have a secure connection.  Are you willing to proceed?" STAFF: Did the patient verbally acknowledge consent to telehealth visit? Document  YES/NO here: yes  4. Advise patient to be prepared - "Two hours prior to your appointment, go ahead and check your blood pressure, pulse, oxygen saturation, and your weight (if you have the equipment to check those) and write them all down. When your visit starts, your provider will ask you for this information. If you have an Apple Watch or Kardia device, please plan to have heart rate information ready on the day of your appointment. Please have a pen and paper handy nearby the day of the visit as well."  5. Give patient instructions for MyChart download to smartphone OR [email or web address] as below if video visit (depending on what platform provider is using)  6. Inform patient they will receive a phone call 15 minutes prior to their appointment time (may be from unknown caller ID) so they should be prepared to answer    TELEPHONE CALL NOTE  Timothy Fowler Sr. has been deemed a candidate for a follow-up tele-health visit to limit community exposure during the Covid-19 pandemic. I spoke with the patient via phone to ensure availability of phone/video source, confirm preferred email & phone number, and discuss instructions and expectations.  I reminded Timothy KULLMAN Sr. to be prepared with any vital sign and/or heart rhythm information that could potentially be obtained via home monitoring, at the time of his visit. I reminded Timothy Fowler Sr. to expect a phone  call prior to his visit.  Timothy Fritz 10/03/2018 3:51 PM   INSTRUCTIONS FOR DOWNLOADING THE MYCHART APP TO SMARTPHONE  - The patient must first make sure to have activated MyChart and know their login information - If Apple, go to [employer] and type in MyChart in the search bar and download the app. If Android, ask patient to go to [employer] and type in [place] in the search bar and download the app. The app is free but as with any other app downloads, their phone may require them to verify saved payment information  or Apple/Android password.  - The patient will need to then log into the app with their MyChart username and password, and select [facility] as their healthcare provider to link the account. When it is time for your visit, go to the MyChart app, find appointments, and click Begin Video Visit. Be sure to Select Allow for your device to access the Microphone and Camera for your visit. You will then be connected, and your provider will be with you shortly.  **If they have any issues connecting, or need assistance please contact MyChart service desk [phone])**  **If using a computer, in order to ensure the best quality for their visit they will need to use either of the following Internet Browsers: [employer], or Google Chrome**  IF USING DOXIMITY or DOXY.ME - The patient will receive a link just prior to their visit by text.     FULL LENGTH CONSENT FOR TELE-HEALTH VISIT   I hereby voluntarily request, consent and authorize [facility] and its employed or contracted physicians, physician assistants, nurse practitioners or other licensed health care professionals (the Practitioner), to provide me with telemedicine health care services (the Services") as deemed necessary by the treating Practitioner. I acknowledge and consent to receive the Services by the Practitioner via telemedicine. I understand that the telemedicine visit will involve communicating with the Practitioner through live audiovisual communication technology and the disclosure of certain medical information by electronic transmission. I acknowledge that I have been given the opportunity to request an in-person assessment or other available alternative prior to the telemedicine visit and am voluntarily participating in the telemedicine visit.  I understand that I have the right to withhold or withdraw my consent to the use of telemedicine in the course of my care at any time, without affecting my right to future  care or treatment, and that the Practitioner or I may terminate the telemedicine visit at any time. I understand that I have the right to inspect all information obtained and/or recorded in the course of the telemedicine visit and may receive copies of available information for a reasonable fee.  I understand that some of the potential risks of receiving the Services via telemedicine include:   Delay or interruption in medical evaluation due to technological equipment failure or disruption;  Information transmitted may not be sufficient (e.g. poor resolution of images) to allow for appropriate medical decision making by the Practitioner; and/or   In rare instances, security protocols could fail, causing a breach of personal health information.  Furthermore, I acknowledge that it is my responsibility to provide information about my medical history, conditions and care that is complete and accurate to the best of my ability. I acknowledge that Practitioner's advice, recommendations, and/or decision may be based on factors not within their control, such as incomplete or inaccurate data provided by me or distortions of diagnostic images or specimens that may result from  electronic transmissions. I understand that the practice of medicine is not an exact science and that Practitioner makes no warranties or guarantees regarding treatment outcomes. I acknowledge that I will receive a copy of this consent concurrently upon execution via email to the email address I last provided but may also request a printed copy by calling the office of [facility].    I understand that my insurance will be billed for this visit.   I have read or had this consent read to me.  I understand the contents of this consent, which adequately explains the benefits and risks of the Services being provided via telemedicine.   I have been provided ample opportunity to ask questions regarding this consent and the Services and have  had my questions answered to my satisfaction.  I give my informed consent for the services to be provided through the use of telemedicine in my medical care  By participating in this telemedicine visit I agree to the above.

## 2018-10-09 ENCOUNTER — Ambulatory Visit (INDEPENDENT_AMBULATORY_CARE_PROVIDER_SITE_OTHER): Payer: Medicare HMO

## 2018-10-09 DIAGNOSIS — I5042 Chronic combined systolic (congestive) and diastolic (congestive) heart failure: Secondary | ICD-10-CM

## 2018-10-09 DIAGNOSIS — Z95810 Presence of automatic (implantable) cardiac defibrillator: Secondary | ICD-10-CM | POA: Diagnosis not present

## 2018-10-10 ENCOUNTER — Encounter: Payer: Medicare HMO | Admitting: *Deleted

## 2018-10-10 ENCOUNTER — Telehealth (INDEPENDENT_AMBULATORY_CARE_PROVIDER_SITE_OTHER): Payer: Medicare HMO | Admitting: Cardiology

## 2018-10-10 ENCOUNTER — Encounter: Payer: Self-pay | Admitting: Cardiology

## 2018-10-10 DIAGNOSIS — I428 Other cardiomyopathies: Secondary | ICD-10-CM | POA: Diagnosis not present

## 2018-10-10 DIAGNOSIS — Z954 Presence of other heart-valve replacement: Secondary | ICD-10-CM | POA: Diagnosis not present

## 2018-10-10 DIAGNOSIS — Z95810 Presence of automatic (implantable) cardiac defibrillator: Secondary | ICD-10-CM | POA: Diagnosis not present

## 2018-10-10 DIAGNOSIS — I4821 Permanent atrial fibrillation: Secondary | ICD-10-CM

## 2018-10-10 NOTE — Patient Instructions (Addendum)
Medication Instructions:   Your physician recommends that you continue on your current medications as directed. Please refer to the Current Medication list given to you today.  Labwork:  NONE  Testing/Procedures: Your physician has requested that you have an echocardiogram in 6 months. Echocardiography is a painless test that uses sound waves to create images of your heart. It provides your doctor with information about the size and shape of your heart and how well your heart's chambers and valves are working. This procedure takes approximately one hour. There are no restrictions for this procedure.  Follow-Up:  Your physician recommends that you schedule a follow-up appointment in: 6 months. You will receive a reminder letter in the mail in about 4 months reminding you to call and schedule your appointment. If you don't receive this letter, please contact our office.  Any Other Special Instructions Will Be Listed Below (If Applicable).  If you need a refill on your cardiac medications before your next appointment, please call your pharmacy. 

## 2018-10-10 NOTE — Progress Notes (Signed)
Virtual Visit via Telephone Note   This visit type was conducted due to national recommendations for restrictions regarding the COVID-19 Pandemic (e.g. social distancing) in an effort to limit this patient's exposure and mitigate transmission in our community.  Due to his co-morbid illnesses, this patient is at least at moderate risk for complications without adequate follow up.  This format is felt to be most appropriate for this patient at this time.  The patient did not have access to video technology/had technical difficulties with video requiring transitioning to audio format only (telephone).  All issues noted in this document were discussed and addressed.  No physical exam could be performed with this format.  Please refer to the patient's chart for his  consent to telehealth for [facility].   Date:  10/10/2018   ID:  Timothy Fowler Sr., DOB 1940/11/06, MRN [id number]  Patient Location: Home Provider Location: Office  PCP:  [name], MD  Cardiologist:  Rozann Lesches, MD Electrophysiologist:  [name], MD   Evaluation Performed:  Follow-Up Visit  Chief Complaint:   Cardiac follow-up  History of Present Illness:    Timothy Fritz. is a 78 y.o. male in February.  We spoke by phone today.  He does not report any specific change in stamina, no increasing breathlessness or chest pain.  He has lost almost 10 pounds, has been working on his diet somewhat.  He does not describe any orthopnea or PND.  He continues to follow with Dr. Rayann Heman, St. Jude CRT-D in place.  Most recent device check indicated normal thoracic impedance, no device shocks.  Biventricular pacing was at only 76%.  He is on high-dose Toprol-XL.  I reviewed his medications with are outlined below.  He uses Lasix perhaps once a week, otherwise states that he has been compliant with his baseline regimen.  We did discuss getting a follow-up echocardiogram prior to his next visit.  The patient does not  have symptoms concerning for COVID-19 infection (fever, chills, cough, or new shortness of breath).    Past Medical History:  Diagnosis Date  . AICD (automatic cardioverter/defibrillator) present 07/11/2012  . Aortic aneurysm, thoracic ([place])    Fusiform s/p Bentall procedure 2002  . Aortic valve, bicuspid    Mechanical AVR  . Basal cell carcinoma of face   . BPH (benign prostatic hypertrophy)   . Coronary atherosclerosis of native coronary artery    Nonobstructive at cardiac catherization 2002  . Essential hypertension, benign   . Left bundle branch block   . Mixed hyperlipidemia   . Nonischemic cardiomyopathy (HCC)    CRT-D  . Persistent atrial fibrillation   . Presence of permanent cardiac pacemaker    Past Surgical History:  Procedure Laterality Date  . BENTALL PROCEDURE  2002   Dr Cyndia Bent   . BI-VENTRICULAR IMPLANTABLE CARDIOVERTER DEFIBRILLATOR N/A 07/12/2012   Procedure: BI-VENTRICULAR IMPLANTABLE CARDIOVERTER DEFIBRILLATOR  (CRT-D);  Surgeon: [name], MD;  Location: [facility] CATH LAB;  Service: Cardiovascular;  Laterality: N/A;  . BI-VENTRICULAR IMPLANTABLE CARDIOVERTER DEFIBRILLATOR  (CRT-D)  07/11/2012   [facility]- Dr. Rayann Heman  . CARDIAC CATHETERIZATION  2002  . CARDIAC VALVE REPLACEMENT     AVR  . CATARACT EXTRACTION Left 01/21/13  . COLONOSCOPY N/A 12/01/2016   Procedure: COLONOSCOPY;  Surgeon: Rogene Houston, MD;  [facility];  Service: Endoscopy;  Laterality: N/A;  2:00  . COLONOSCOPY WITH PROPOFOL N/A 11/08/2017   Procedure: COLONOSCOPY WITH PROPOFOL;  Surgeon:  Ronnette Juniper, MD;  Location[facility] ENDOSCOPY;  Service: Gastroenterology;  Laterality: N/A;  . ESOPHAGOGASTRODUODENOSCOPY (EGD) WITH PROPOFOL N/A 11/08/2017   Procedure: ESOPHAGOGASTRODUODENOSCOPY (EGD) WITH PROPOFOL Needs INR<2;  Surgeon: Ronnette Juniper, MD;  Location: [place];  Service: Gastroenterology;  Laterality: N/A;  . HERNIA REPAIR    . IR ANGIOGRAM SELECTIVE EACH  ADDITIONAL VESSEL  11/10/2017  . IR ANGIOGRAM VISCERAL SELECTIVE  11/10/2017  . IR ANGIOGRAM VISCERAL SELECTIVE  11/10/2017  . IR ANGIOGRAM VISCERAL SELECTIVE  11/10/2017  . IR EMBO ART  VEN HEMORR LYMPH EXTRAV  INC GUIDE ROADMAPPING  11/10/2017  . IR US GUIDE VASC ACCESS RIGHT  11/10/2017  . POLYPECTOMY  11/08/2017   Procedure: POLYPECTOMY;  Surgeon: Ronnette Juniper, MD;  Location: [facility] ENDOSCOPY;  Service: Gastroenterology;;  . TONSILLECTOMY AND ADENOIDECTOMY  1949  . UMBILICAL HERNIA REPAIR  09/2017     Current Meds  Medication Sig  . acetaminophen (TYLENOL) 500 MG tablet Take 1,000 mg by mouth every 6 (six) hours as needed for headache (pain).  Marland Kitchen atorvastatin (LIPITOR) 10 MG tablet Take 10 mg by mouth daily at 6 PM.   . cholecalciferol (VITAMIN D) 1000 units tablet Take 1,000 Units by mouth at bedtime.   . furosemide (LASIX) 20 MG tablet Take 20 mg by mouth daily as needed for fluid or edema.   Marland Kitchen losartan (COZAAR) 25 MG tablet Take 0.5 tablets (12.5 mg total) by mouth at bedtime.  . meclizine (ANTIVERT) 25 MG tablet Take 25 mg by mouth 3 (three) times daily as needed for dizziness.  . metoprolol succinate (TOPROL-XL) 100 MG 24 hr tablet TAKE 1 AND 1/2 TABLETS BY MOUTH TWICE DAILY  . metroNIDAZOLE (METROCREAM) 0.75 % cream Apply 1 application topically daily as needed (rosacea).  . Omega-3 Fatty Acids (FISH OIL) 1000 MG CAPS Take 1,000 mg by mouth daily.   Marland Kitchen oxymetazoline (AFRIN) 0.05 % nasal spray Place 1 spray into both nostrils daily as needed for congestion.  . pantoprazole (PROTONIX) 40 MG tablet Take 1 tablet (40 mg total) by mouth 2 (two) times daily before a meal. (Patient taking differently: Take 40 mg by mouth daily. )  . phenylephrine (NEO-SYNEPHRINE) 1 % nasal spray Place 1 drop into both nostrils daily as needed for congestion.  . Tamsulosin HCl (FLOMAX) 0.4 MG CAPS Take 0.4 mg by mouth at bedtime.   . vitamin C (ASCORBIC ACID) 500 MG tablet Take 500 mg by mouth daily.  Marland Kitchen warfarin  (COUMADIN) 5 MG tablet Take 5 mg by mouth at bedtime.   . [DISCONTINUED] ferrous sulfate 325 (65 FE) MG tablet Take 1 tablet (325 mg total) by mouth every other day.     Allergies:   Patient has no known allergies.   Social History   Tobacco Use  . Smoking status: Former Smoker    Packs/day: 1.00    Years: 41.00    Pack years: 41.00    Types: Cigarettes    Quit date: 02/15/2000    Years since quitting: 18.6  . Smokeless tobacco: Former Systems developer    Types: Snuff, Chew  . Tobacco comment: 11/06/2017 "used chew and snuff in my teens"  Substance Use Topics  . Alcohol use: Yes    Comment: 11/06/2017  "2-3 glasses of wine/month"  . Drug use: Never     Family Hx: The patient's family history includes Coronary artery disease in his father.  ROS:   Please see the history of present illness.    Memory loss. All other systems  reviewed and are negative.   Prior CV studies:   The following studies were reviewed today:  Echocardiogram 11/06/2017: Study Conclusions  - Left ventricle: The cavity size was normal. Systolic function was   mildly to moderately reduced. The estimated ejection fraction was   in the range of 40% to 45%. - Aortic valve: Mildly calcified annulus. Moderately thickened,   moderately calcified leaflets. There was moderate stenosis. Valve   area (VTI): 1.59 cm^2. Valve area (Vmax): 1.61 cm^2. Valve area   (Vmean): 1.34 cm^2. - Mitral valve: There was mild regurgitation. - Left atrium: The atrium was moderately dilated. - Right atrium: The atrium was mildly dilated. - Pulmonary arteries: Systolic pressure was mildly increased. PA   peak pressure: 34 mm Hg (S).  Labs/Other Tests and Data Reviewed:    EKG:  An ECG dated 11/19/2017 was personally reviewed today and demonstrated:  Intermittent dual-chamber pacing with PVCs.  Recent Labs: 11/05/2017: Magnesium 2.0 11/19/2017: ALT 31; BUN 10; Creatinine, Ser 1.05; Hemoglobin 10.3; Platelets 206; Potassium 4.3; Sodium 139     Wt Readings from Last 3 Encounters:  10/10/18 200 lb (90.7 kg)  03/30/18 209 lb (94.8 kg)  12/25/17 204 lb 6.4 oz (92.7 kg)     Objective:    Vital Signs:  Pulse 84   Ht 5\' 8"  (1.727 m)   Wt 200 lb (90.7 kg)   BMI 30.41 kg/m    He did not have a way to check blood pressure today. Patient spoke in full sentences, not short of breath. No audible wheezing or coughing.  ASSESSMENT & PLAN:    1.  Nonischemic cardiomyopathy with LVEF 40 to 45%.  He continues on Toprol-XL and losartan.  He uses Lasix only intermittently, weight is down without evidence of fluid overload.  We will obtain an echocardiogram prior to his next visit.  2.  St. Jude CRT-D in place.  He follows with Dr. Rayann Heman.  Biventricular pacing rate is suboptimal in the setting of atrial fibrillation.  He is on high-dose beta-blocker at this time.  I wonder whether he might be a candidate for AV node ablation at time of next generator change.  3.  Aortic valve disease status post Bentall procedure with mechanical AVR.  He remains on Coumadin with follow-up by Dr. Manuella Ghazi.  4.  Permanent atrial fibrillation/flutter.  He is on Coumadin for stroke prophylaxis and Toprol-XL.  COVID-19 Education: The signs and symptoms of COVID-19 were discussed with the patient and how to seek care for testing (follow up with PCP or arrange E-visit).  The importance of social distancing was discussed today.  Time:   Today, I have spent 8 minutes with the patient with telehealth technology discussing the above problems.     Medication Adjustments/Labs and Tests Ordered: Current medicines are reviewed at length with the patient today.  Concerns regarding medicines are outlined above.   Tests Ordered: Orders Placed This Encounter  Procedures  . ECHOCARDIOGRAM COMPLETE    Medication Changes: No orders of the defined types were placed in this encounter.   Follow Up:  In Person 6 months in the [place] office.  Signed, Rozann Lesches,  MD  10/10/2018 2:27 PM    [place]

## 2018-10-12 NOTE — Progress Notes (Signed)
EPIC Encounter for ICM Monitoring  Patient Name: Timothy Fritz. is a 78 y.o. male Date: 10/12/2018 Primary Care Physican: [name], MD Primary Cardiologist:McDowell Electrophysiologist: Allred Bi-V Pacing: 76% 8/28/2020Weight:194-195 lbs  AT/AF Burden>99%  Battery Longevity: ~10.1 months  Spoke with patient.  He said he is doing well.    Corvue thoracic impedance normal but was suggestive of possible fluid accumulation from 8/19 - 8/24.  Prescribed:Furosemide 20 mg 1 tablet daily.Patient takes differently: He takes Furosemide 20 mg 1 tablet PRN.  Labs: 01/16/2018 Creatinine 0.97, BUN 16, Potassium 4.6, Sodium 138, GFR >60 11/19/2017 Creatinine1.05, BUN10, Potassium4.3, Sodium139, EGFR>60 A complete set of results can be found in Results Review.  Recommendations:No changes and encouraged to call if experiencing any fluid symptoms.  Follow-up plan: ICM clinic phone appointment on10/07/2018.OV with Dr Rayann Heman 10/19/2018.  Copy of ICM check sent to Dr.Allred.   3 month ICM trend: 10/09/2018    1 Year ICM trend:       [name], RN 10/12/2018 12:40 PM

## 2018-10-17 DIAGNOSIS — I509 Heart failure, unspecified: Secondary | ICD-10-CM | POA: Diagnosis not present

## 2018-10-17 DIAGNOSIS — E7800 Pure hypercholesterolemia, unspecified: Secondary | ICD-10-CM | POA: Diagnosis not present

## 2018-10-18 ENCOUNTER — Telehealth: Payer: Self-pay | Admitting: *Deleted

## 2018-10-18 NOTE — Telephone Encounter (Signed)
Pt meds/allergies/pharmacy reviewed. Pt will have vitals for phone call tomorrow

## 2018-10-19 ENCOUNTER — Telehealth (INDEPENDENT_AMBULATORY_CARE_PROVIDER_SITE_OTHER): Payer: Medicare HMO | Admitting: Internal Medicine

## 2018-10-19 ENCOUNTER — Encounter: Payer: Self-pay | Admitting: Internal Medicine

## 2018-10-19 VITALS — BP 143/74 | HR 84 | Ht 68.0 in | Wt 198.0 lb

## 2018-10-19 DIAGNOSIS — I4821 Permanent atrial fibrillation: Secondary | ICD-10-CM | POA: Diagnosis not present

## 2018-10-19 DIAGNOSIS — I519 Heart disease, unspecified: Secondary | ICD-10-CM | POA: Diagnosis not present

## 2018-10-19 DIAGNOSIS — I10 Essential (primary) hypertension: Secondary | ICD-10-CM

## 2018-10-19 DIAGNOSIS — I119 Hypertensive heart disease without heart failure: Secondary | ICD-10-CM | POA: Diagnosis not present

## 2018-10-19 DIAGNOSIS — I428 Other cardiomyopathies: Secondary | ICD-10-CM | POA: Diagnosis not present

## 2018-10-19 NOTE — Progress Notes (Signed)
Electrophysiology TeleHealth Note   Due to national recommendations of social distancing due to [place] 19, an audio telehealth visit is felt to be most appropriate for this patient at this time.  Verbal consent was obtained by me for the telehealth visit today.  The patient does not have capability for a virtual visit.  A phone visit is therefore required today.   Date:  10/19/2018   ID:  Timothy Fowler Sr., DOB 12-16-1940, MRN [id number]  Location: patient's home  Provider location:  [facility]  Evaluation Performed: Follow-up visit  PCP:  [name], MD   Electrophysiologist:  Dr Rayann Heman  Chief Complaint:  CHF  History of Present Illness:    Timothy SCHMALZRIED Sr. is a 78 y.o. male who presents via telehealth conferencing today.  Since last being seen in our clinic, the patient reports doing very well.  Not very active right now due to COVID 19.  Today, he denies symptoms of palpitations, chest pain, shortness of breath,  lower extremity edema, dizziness, presyncope, or syncope.  The patient is otherwise without complaint today.  The patient denies symptoms of fevers, chills, cough, or new SOB worrisome for COVID 19.  Past Medical History:  Diagnosis Date  . AICD (automatic cardioverter/defibrillator) present 07/11/2012  . Aortic aneurysm, thoracic ([place])    Fusiform s/p Bentall procedure 2002  . Aortic valve, bicuspid    Mechanical AVR  . Basal cell carcinoma of face   . BPH (benign prostatic hypertrophy)   . Coronary atherosclerosis of native coronary artery    Nonobstructive at cardiac catherization 2002  . Essential hypertension, benign   . Left bundle branch block   . Mixed hyperlipidemia   . Nonischemic cardiomyopathy (HCC)    CRT-D  . Persistent atrial fibrillation   . Presence of permanent cardiac pacemaker     Past Surgical History:  Procedure Laterality Date  . BENTALL PROCEDURE  2002   Dr Cyndia Bent   . BI-VENTRICULAR IMPLANTABLE CARDIOVERTER DEFIBRILLATOR N/A  07/12/2012   Procedure: BI-VENTRICULAR IMPLANTABLE CARDIOVERTER DEFIBRILLATOR  (CRT-D);  Surgeon: [name], MD;  Location: [facility] CATH LAB;  Service: Cardiovascular;  Laterality: N/A;  . BI-VENTRICULAR IMPLANTABLE CARDIOVERTER DEFIBRILLATOR  (CRT-D)  07/11/2012   [facility]- Dr. Rayann Heman  . CARDIAC CATHETERIZATION  2002  . CARDIAC VALVE REPLACEMENT     AVR  . CATARACT EXTRACTION Left 01/21/13  . COLONOSCOPY N/A 12/01/2016   Procedure: COLONOSCOPY;  Surgeon: Rogene Houston, MD;  [facility] ENDO [facility];  Service: Endoscopy;  Laterality: N/A;  2:00  . COLONOSCOPY WITH PROPOFOL N/A 11/08/2017   Procedure: COLONOSCOPY WITH PROPOFOL;  Surgeon: Ronnette Juniper, MD;  Location: [place];  Service: Gastroenterology;  Laterality: N/A;  . ESOPHAGOGASTRODUODENOSCOPY (EGD) WITH PROPOFOL N/A 11/08/2017   Procedure: ESOPHAGOGASTRODUODENOSCOPY (EGD) WITH PROPOFOL Needs INR<2;  Surgeon: Ronnette Juniper, MD;  Location: [place];  Service: Gastroenterology;  Laterality: N/A;  . HERNIA REPAIR    . IR ANGIOGRAM SELECTIVE EACH ADDITIONAL VESSEL  11/10/2017  . IR ANGIOGRAM VISCERAL SELECTIVE  11/10/2017  . IR ANGIOGRAM VISCERAL SELECTIVE  11/10/2017  . IR ANGIOGRAM VISCERAL SELECTIVE  11/10/2017  . IR EMBO ART  VEN HEMORR LYMPH EXTRAV  INC GUIDE ROADMAPPING  11/10/2017  . IR US GUIDE VASC ACCESS RIGHT  11/10/2017  . POLYPECTOMY  11/08/2017   Procedure: POLYPECTOMY;  Surgeon: Ronnette Juniper, MD;  Location: [facility] ENDOSCOPY;  Service: Gastroenterology;;  . TONSILLECTOMY AND ADENOIDECTOMY  1949  . UMBILICAL HERNIA REPAIR  09/2017    Current Outpatient Medications  Medication Sig Dispense Refill  . acetaminophen (TYLENOL) 500 MG tablet Take 1,000 mg by mouth every 6 (six) hours as needed for headache (pain).    Marland Kitchen atorvastatin (LIPITOR) 10 MG tablet Take 10 mg by mouth daily at 6 PM.     . cholecalciferol (VITAMIN D) 1000 units tablet Take 1,000 Units by mouth at bedtime.     . furosemide (LASIX) 20 MG tablet  Take 20 mg by mouth daily as needed for fluid or edema.     Marland Kitchen losartan (COZAAR) 25 MG tablet Take 0.5 tablets (12.5 mg total) by mouth at bedtime. 45 tablet 3  . meclizine (ANTIVERT) 25 MG tablet Take 25 mg by mouth 3 (three) times daily as needed for dizziness.    . metoprolol succinate (TOPROL-XL) 100 MG 24 hr tablet TAKE 1 AND 1/2 TABLETS BY MOUTH TWICE DAILY 270 tablet 1  . metroNIDAZOLE (METROCREAM) 0.75 % cream Apply 1 application topically daily as needed (rosacea).    . Omega-3 Fatty Acids (FISH OIL) 1000 MG CAPS Take 1,000 mg by mouth daily.     Marland Kitchen oxymetazoline (AFRIN) 0.05 % nasal spray Place 1 spray into both nostrils daily as needed for congestion.    . pantoprazole (PROTONIX) 40 MG tablet Take 1 tablet (40 mg total) by mouth 2 (two) times daily before a meal. (Patient taking differently: Take 40 mg by mouth daily. ) 60 tablet 0  . phenylephrine (NEO-SYNEPHRINE) 1 % nasal spray Place 1 drop into both nostrils daily as needed for congestion.    . Tamsulosin HCl (FLOMAX) 0.4 MG CAPS Take 0.4 mg by mouth at bedtime.     . vitamin C (ASCORBIC ACID) 500 MG tablet Take 500 mg by mouth daily.    Marland Kitchen warfarin (COUMADIN) 5 MG tablet Take 5 mg by mouth at bedtime.      No current facility-administered medications for this visit.     Allergies:   Patient has no known allergies.   Social History:  The patient  reports that he quit smoking about 18 years ago. His smoking use included cigarettes. He has a 41.00 pack-year smoking history. He has quit using smokeless tobacco.  His smokeless tobacco use included snuff and chew. He reports current alcohol use. He reports that he does not use drugs.   Family History:  The patient's  family history includes Coronary artery disease in his father.   ROS:  Please see the history of present illness.   All other systems are personally reviewed and negative.    Exam:    Vital Signs:  BP (!) 143/74   Pulse 84   Ht 5\' 8"  (1.727 m)   Wt 198 lb (89.8 kg)    BMI 30.11 kg/m   Well sounding, alert and conversant   Labs/Other Tests and Data Reviewed:    Recent Labs: 11/05/2017: Magnesium 2.0 11/19/2017: ALT 31; BUN 10; Creatinine, Ser 1.05; Hemoglobin 10.3; Platelets 206; Potassium 4.3; Sodium 139   Wt Readings from Last 3 Encounters:  10/19/18 198 lb (89.8 kg)  10/10/18 200 lb (90.7 kg)  03/30/18 209 lb (94.8 kg)     Last device remote is reviewed from [place] PDF which reveals normal device function, no arrhythmias    ASSESSMENT & PLAN:    1.  Chronic systolic dysfunction No CHF symptoms Remotes are uptodate Normal BiV ICD function but only BiV pacing 76%.  I have again today offered AV nodal ablation which he  declines Followed in ICM device clinic  2. Mechanical AVR On coumadin  3. HTN Stable No change required today  4. Permanent afib Rate controlled but has RVR at times despite medical therapy.  He declines AV nodal ablation. On coumadin  Follow-up:  Remotes Return in 10 months with me  Patient Risk:  after full review of this patients clinical status, I feel that they are at moderate risk at this time.  Today, I have spent 15 minutes with the patient with telehealth technology discussing arrhythmia management .    Army Fossa, MD  10/19/2018 2:14 PM     [place] [address] [phone] (office) [phone] (fax)

## 2018-10-29 DIAGNOSIS — I4891 Unspecified atrial fibrillation: Secondary | ICD-10-CM | POA: Diagnosis not present

## 2018-10-29 DIAGNOSIS — I5022 Chronic systolic (congestive) heart failure: Secondary | ICD-10-CM | POA: Diagnosis not present

## 2018-10-29 DIAGNOSIS — Z299 Encounter for prophylactic measures, unspecified: Secondary | ICD-10-CM | POA: Diagnosis not present

## 2018-10-29 DIAGNOSIS — Z6832 Body mass index (BMI) 32.0-32.9, adult: Secondary | ICD-10-CM | POA: Diagnosis not present

## 2018-10-29 DIAGNOSIS — I10 Essential (primary) hypertension: Secondary | ICD-10-CM | POA: Diagnosis not present

## 2018-11-12 DIAGNOSIS — I10 Essential (primary) hypertension: Secondary | ICD-10-CM | POA: Diagnosis not present

## 2018-11-12 DIAGNOSIS — Z299 Encounter for prophylactic measures, unspecified: Secondary | ICD-10-CM | POA: Diagnosis not present

## 2018-11-12 DIAGNOSIS — Z6832 Body mass index (BMI) 32.0-32.9, adult: Secondary | ICD-10-CM | POA: Diagnosis not present

## 2018-11-12 DIAGNOSIS — Z23 Encounter for immunization: Secondary | ICD-10-CM | POA: Diagnosis not present

## 2018-11-12 DIAGNOSIS — I4891 Unspecified atrial fibrillation: Secondary | ICD-10-CM | POA: Diagnosis not present

## 2018-11-12 DIAGNOSIS — I509 Heart failure, unspecified: Secondary | ICD-10-CM | POA: Diagnosis not present

## 2018-11-20 ENCOUNTER — Ambulatory Visit (INDEPENDENT_AMBULATORY_CARE_PROVIDER_SITE_OTHER): Payer: Medicare HMO

## 2018-11-20 DIAGNOSIS — I428 Other cardiomyopathies: Secondary | ICD-10-CM

## 2018-11-20 DIAGNOSIS — Z95810 Presence of automatic (implantable) cardiac defibrillator: Secondary | ICD-10-CM | POA: Diagnosis not present

## 2018-11-21 DIAGNOSIS — E7800 Pure hypercholesterolemia, unspecified: Secondary | ICD-10-CM | POA: Diagnosis not present

## 2018-11-21 DIAGNOSIS — I509 Heart failure, unspecified: Secondary | ICD-10-CM | POA: Diagnosis not present

## 2018-11-23 ENCOUNTER — Telehealth: Payer: Self-pay

## 2018-11-23 NOTE — Progress Notes (Signed)
EPIC Encounter for ICM Monitoring  Patient Name: Timothy Fritz. is a 78 y.o. male Date: 11/23/2018 Primary Care Physican: [name], MD Primary Cardiologist:McDowell Electrophysiologist: Allred Bi-V Pacing: 77% 8/28/2020Weight:194-195 lbs  AT/AF Burden>99%  Battery Longevity: ~8 months            Attempted call to patient and unable to reach.  Transmission reviewed.   Corvue thoracic impedance normalbut was suggestive of possible fluid accumulation from 8/19 - 8/24.  Prescribed:Furosemide 20 mg 1 tablet daily.Patient takes differently: He takes Furosemide 20 mg 1 tablet PRN.  Labs: 01/16/2018 Creatinine 0.97, BUN 16, Potassium 4.6, Sodium 138, GFR >60 11/19/2017 Creatinine1.05, BUN10, Potassium4.3, Sodium139, EGFR>60 A complete set of results can be found in Results Review.  Recommendations: Unable to reach.    Follow-up plan: ICM clinic phone appointment on 12/24/2018.   91 day device clinic remote transmission 01/09/2019.     Copy of ICM check sent to Dr. Rayann Heman.   3 month ICM trend: 11/20/2018    1 Year ICM trend:       [name], RN 11/23/2018 4:08 PM

## 2018-11-23 NOTE — Telephone Encounter (Signed)
Remote ICM transmission received.  Attempted call to patient regarding ICM remote transmission and no message left. 

## 2018-12-12 DIAGNOSIS — I4891 Unspecified atrial fibrillation: Secondary | ICD-10-CM | POA: Diagnosis not present

## 2018-12-12 DIAGNOSIS — I428 Other cardiomyopathies: Secondary | ICD-10-CM | POA: Diagnosis not present

## 2018-12-12 DIAGNOSIS — I5022 Chronic systolic (congestive) heart failure: Secondary | ICD-10-CM | POA: Diagnosis not present

## 2018-12-12 DIAGNOSIS — I10 Essential (primary) hypertension: Secondary | ICD-10-CM | POA: Diagnosis not present

## 2018-12-12 DIAGNOSIS — K439 Ventral hernia without obstruction or gangrene: Secondary | ICD-10-CM | POA: Diagnosis not present

## 2018-12-12 DIAGNOSIS — Z299 Encounter for prophylactic measures, unspecified: Secondary | ICD-10-CM | POA: Diagnosis not present

## 2018-12-12 DIAGNOSIS — Z6832 Body mass index (BMI) 32.0-32.9, adult: Secondary | ICD-10-CM | POA: Diagnosis not present

## 2018-12-24 ENCOUNTER — Ambulatory Visit (INDEPENDENT_AMBULATORY_CARE_PROVIDER_SITE_OTHER): Payer: Medicare HMO

## 2018-12-24 DIAGNOSIS — Z95810 Presence of automatic (implantable) cardiac defibrillator: Secondary | ICD-10-CM | POA: Diagnosis not present

## 2018-12-24 DIAGNOSIS — I5042 Chronic combined systolic (congestive) and diastolic (congestive) heart failure: Secondary | ICD-10-CM

## 2018-12-26 NOTE — Progress Notes (Signed)
EPIC Encounter for ICM Monitoring  Patient Name: Corky Blumstein. is a 78 y.o. male Date: 12/26/2018 Primary Care Physican: [name], MD Primary Cardiologist:McDowell Electrophysiologist: Allred Bi-V Pacing: 78% 11/11/2020Weight:200 lbs  AT/AF Burden>99%  Battery Longevity: ~6.8 months             Spoke with patient and is asymptomatic.   Corvue thoracic impedance normal.  Prescribed:Furosemide 20 mg 1 tablet daily as needed.  Labs: 03/02/2018 Creatinine 1.08, BUN 19 Care Everywhere 01/16/2018 Creatinine 0.97, BUN 16, Potassium 4.6, Sodium 138, GFR >60 11/19/2017 Creatinine1.05, BUN10, Potassium4.3, Sodium139, EGFR>60 A complete set of results can be found in Results Review.  Recommendations: No changes and encouraged to call if experiencing any fluid symptoms.  Follow-up plan: ICM clinic phone appointment on 01/28/2019.   91 day device clinic remote transmission 01/09/2019.    Copy of ICM check sent to Dr. Rayann Heman.   3 month ICM trend: 12/24/2018    1 Year ICM trend:       [name], RN 12/26/2018 11:31 AM

## 2018-12-31 DIAGNOSIS — I509 Heart failure, unspecified: Secondary | ICD-10-CM | POA: Diagnosis not present

## 2018-12-31 DIAGNOSIS — E7800 Pure hypercholesterolemia, unspecified: Secondary | ICD-10-CM | POA: Diagnosis not present

## 2019-01-09 ENCOUNTER — Ambulatory Visit (INDEPENDENT_AMBULATORY_CARE_PROVIDER_SITE_OTHER): Payer: Medicare HMO | Admitting: *Deleted

## 2019-01-09 DIAGNOSIS — I5042 Chronic combined systolic (congestive) and diastolic (congestive) heart failure: Secondary | ICD-10-CM | POA: Diagnosis not present

## 2019-01-10 LAB — CUP PACEART REMOTE DEVICE CHECK
Battery Remaining Longevity: 7 mo
Battery Remaining Percentage: 9 %
Battery Voltage: 2.66 V
Brady Statistic AP VP Percent: 66 %
Brady Statistic AP VS Percent: 3.9 %
Brady Statistic AS VP Percent: 21 %
Brady Statistic AS VS Percent: 8.3 %
Brady Statistic RA Percent Paced: 13 %
Date Time Interrogation Session: 20201125035728
HighPow Impedance: 66 Ohm
HighPow Impedance: 66 Ohm
Implantable Lead Implant Date: 20140529
Implantable Lead Implant Date: 20140529
Implantable Lead Implant Date: 20140529
Implantable Lead Location: 753858
Implantable Lead Location: 753859
Implantable Lead Location: 753860
Implantable Pulse Generator Implant Date: 20140529
Lead Channel Impedance Value: 360 Ohm
Lead Channel Impedance Value: 450 Ohm
Lead Channel Impedance Value: 690 Ohm
Lead Channel Pacing Threshold Amplitude: 0.75 V
Lead Channel Pacing Threshold Amplitude: 0.875 V
Lead Channel Pacing Threshold Amplitude: 1 V
Lead Channel Pacing Threshold Pulse Width: 0.5 ms
Lead Channel Pacing Threshold Pulse Width: 0.5 ms
Lead Channel Pacing Threshold Pulse Width: 0.5 ms
Lead Channel Sensing Intrinsic Amplitude: 12 mV
Lead Channel Sensing Intrinsic Amplitude: 4.1 mV
Lead Channel Setting Pacing Amplitude: 2 V
Lead Channel Setting Pacing Amplitude: 2 V
Lead Channel Setting Pacing Amplitude: 2 V
Lead Channel Setting Pacing Pulse Width: 0.5 ms
Lead Channel Setting Pacing Pulse Width: 0.5 ms
Lead Channel Setting Sensing Sensitivity: 0.5 mV
Pulse Gen Serial Number: 7097390

## 2019-01-15 DIAGNOSIS — Z0000 Encounter for general adult medical examination without abnormal findings: Secondary | ICD-10-CM | POA: Diagnosis not present

## 2019-01-15 DIAGNOSIS — Z1211 Encounter for screening for malignant neoplasm of colon: Secondary | ICD-10-CM | POA: Diagnosis not present

## 2019-01-15 DIAGNOSIS — E7800 Pure hypercholesterolemia, unspecified: Secondary | ICD-10-CM | POA: Diagnosis not present

## 2019-01-15 DIAGNOSIS — I10 Essential (primary) hypertension: Secondary | ICD-10-CM | POA: Diagnosis not present

## 2019-01-15 DIAGNOSIS — Z7189 Other specified counseling: Secondary | ICD-10-CM | POA: Diagnosis not present

## 2019-01-15 DIAGNOSIS — G309 Alzheimer's disease, unspecified: Secondary | ICD-10-CM | POA: Diagnosis not present

## 2019-01-15 DIAGNOSIS — I5022 Chronic systolic (congestive) heart failure: Secondary | ICD-10-CM | POA: Diagnosis not present

## 2019-01-15 DIAGNOSIS — Z1339 Encounter for screening examination for other mental health and behavioral disorders: Secondary | ICD-10-CM | POA: Diagnosis not present

## 2019-01-15 DIAGNOSIS — R5383 Other fatigue: Secondary | ICD-10-CM | POA: Diagnosis not present

## 2019-01-15 DIAGNOSIS — Z1331 Encounter for screening for depression: Secondary | ICD-10-CM | POA: Diagnosis not present

## 2019-01-16 DIAGNOSIS — R5383 Other fatigue: Secondary | ICD-10-CM | POA: Diagnosis not present

## 2019-01-16 DIAGNOSIS — E7800 Pure hypercholesterolemia, unspecified: Secondary | ICD-10-CM | POA: Diagnosis not present

## 2019-01-16 DIAGNOSIS — Z125 Encounter for screening for malignant neoplasm of prostate: Secondary | ICD-10-CM | POA: Diagnosis not present

## 2019-01-16 DIAGNOSIS — Z79899 Other long term (current) drug therapy: Secondary | ICD-10-CM | POA: Diagnosis not present

## 2019-01-28 ENCOUNTER — Ambulatory Visit: Payer: Medicare HMO

## 2019-01-28 DIAGNOSIS — I509 Heart failure, unspecified: Secondary | ICD-10-CM | POA: Diagnosis not present

## 2019-01-28 DIAGNOSIS — Z95810 Presence of automatic (implantable) cardiac defibrillator: Secondary | ICD-10-CM

## 2019-01-28 DIAGNOSIS — I5042 Chronic combined systolic (congestive) and diastolic (congestive) heart failure: Secondary | ICD-10-CM

## 2019-01-28 DIAGNOSIS — E7800 Pure hypercholesterolemia, unspecified: Secondary | ICD-10-CM | POA: Diagnosis not present

## 2019-01-30 NOTE — Progress Notes (Signed)
EPIC Encounter for ICM Monitoring  Patient Name: Timothy Fritz. is a 78 y.o. male Date: 01/30/2019 Primary Care Physican: [name], MD Primary Cardiologist:McDowell Electrophysiologist: Allred Bi-V Pacing: 79% 12/16/2020Weight:200 lbs  AT/AF Burden>99%  Battery Longevity: ~6.82month    Spoke with patient and he is asymptomatic for fluid accumulation.   Corvue thoracic impedance normal.  Prescribed:Furosemide 20 mg 1 tablet daily as needed.  Labs: 03/02/2018 Creatinine 1.08, BUN 19 Care Everywhere 01/16/2018 Creatinine 0.97, BUN 16, Potassium 4.6, Sodium 138, GFR >60 11/19/2017 Creatinine1.05, BUN10, Potassium4.3, Sodium139, EGFR>60 A complete set of results can be found in Results Review.  Recommendations:  No changes and encouraged to call if experiencing any fluid symptoms.  Follow-up plan: ICM clinic phone appointment on 03/04/2019   91 day device clinic remote transmission 04/10/2019.    Copy of ICM check sent to Dr. ARayann Heman   3 month ICM trend: 01/28/2019    1 Year ICM trend:       LRosalene Billings RN 01/30/2019 10:35 AM

## 2019-01-31 LAB — CUP PACEART REMOTE DEVICE CHECK
Battery Remaining Longevity: 7 mo
Battery Remaining Percentage: 9 %
Battery Voltage: 2.66 V
Brady Statistic AP VP Percent: 66 %
Brady Statistic AP VS Percent: 3.9 %
Brady Statistic AS VP Percent: 21 %
Brady Statistic AS VS Percent: 8.3 %
Brady Statistic RA Percent Paced: 13 %
Brady Statistic RV Percent Paced: 79 %
Date Time Interrogation Session: 20201217040507
HighPow Impedance: 64 Ohm
HighPow Impedance: 64 Ohm
Implantable Lead Implant Date: 20140529
Implantable Lead Implant Date: 20140529
Implantable Lead Implant Date: 20140529
Implantable Lead Location: 753858
Implantable Lead Location: 753859
Implantable Lead Location: 753860
Implantable Pulse Generator Implant Date: 20140529
Lead Channel Impedance Value: 360 Ohm
Lead Channel Impedance Value: 440 Ohm
Lead Channel Impedance Value: 680 Ohm
Lead Channel Pacing Threshold Amplitude: 0.75 V
Lead Channel Pacing Threshold Amplitude: 0.875 V
Lead Channel Pacing Threshold Amplitude: 1 V
Lead Channel Pacing Threshold Pulse Width: 0.5 ms
Lead Channel Pacing Threshold Pulse Width: 0.5 ms
Lead Channel Pacing Threshold Pulse Width: 0.5 ms
Lead Channel Sensing Intrinsic Amplitude: 11.7 mV
Lead Channel Sensing Intrinsic Amplitude: 4.1 mV
Lead Channel Setting Pacing Amplitude: 2 V
Lead Channel Setting Pacing Amplitude: 2 V
Lead Channel Setting Pacing Amplitude: 2 V
Lead Channel Setting Pacing Pulse Width: 0.5 ms
Lead Channel Setting Pacing Pulse Width: 0.5 ms
Lead Channel Setting Sensing Sensitivity: 0.5 mV
Pulse Gen Serial Number: 7097390

## 2019-02-18 ENCOUNTER — Other Ambulatory Visit: Payer: Self-pay | Admitting: Cardiology

## 2019-02-18 DIAGNOSIS — Z299 Encounter for prophylactic measures, unspecified: Secondary | ICD-10-CM | POA: Diagnosis not present

## 2019-02-18 DIAGNOSIS — Z87891 Personal history of nicotine dependence: Secondary | ICD-10-CM | POA: Diagnosis not present

## 2019-02-18 DIAGNOSIS — K439 Ventral hernia without obstruction or gangrene: Secondary | ICD-10-CM | POA: Diagnosis not present

## 2019-02-18 DIAGNOSIS — I509 Heart failure, unspecified: Secondary | ICD-10-CM | POA: Diagnosis not present

## 2019-02-18 DIAGNOSIS — G309 Alzheimer's disease, unspecified: Secondary | ICD-10-CM | POA: Diagnosis not present

## 2019-02-18 DIAGNOSIS — Z6833 Body mass index (BMI) 33.0-33.9, adult: Secondary | ICD-10-CM | POA: Diagnosis not present

## 2019-02-18 DIAGNOSIS — I10 Essential (primary) hypertension: Secondary | ICD-10-CM | POA: Diagnosis not present

## 2019-02-18 DIAGNOSIS — I4891 Unspecified atrial fibrillation: Secondary | ICD-10-CM | POA: Diagnosis not present

## 2019-03-04 ENCOUNTER — Ambulatory Visit (INDEPENDENT_AMBULATORY_CARE_PROVIDER_SITE_OTHER): Payer: Medicare HMO

## 2019-03-04 DIAGNOSIS — Z95810 Presence of automatic (implantable) cardiac defibrillator: Secondary | ICD-10-CM

## 2019-03-04 DIAGNOSIS — I5042 Chronic combined systolic (congestive) and diastolic (congestive) heart failure: Secondary | ICD-10-CM

## 2019-03-05 DIAGNOSIS — I509 Heart failure, unspecified: Secondary | ICD-10-CM | POA: Diagnosis not present

## 2019-03-05 DIAGNOSIS — E7800 Pure hypercholesterolemia, unspecified: Secondary | ICD-10-CM | POA: Diagnosis not present

## 2019-03-08 NOTE — Progress Notes (Signed)
EPIC Encounter for ICM Monitoring  Patient Name: Timothy Fritz. is a 79 y.o. male Date: 03/08/2019 Primary Care Physican: [name], MD Primary Cardiologist:McDowell Electrophysiologist: Allred Bi-V Pacing: 79% 1/22/2021Weight:207lbs  AT/AF Burden>99%  Battery Longevity: ~5.95months   Spoke with patient and he is asymptomatic for fluid accumulation. He asked who to contact regarding coumadin recommendations needed for an upcoming dental procedure.          Coumadin is managed by Dr Brigitte Pulse and advised him to contact that office for recommendations.    Corvue thoracic impedance normal.  Prescribed:Furosemide 20 mg 1 tablet dailyas needed.  Labs: 03/02/2018 Creatinine 1.08, BUN 19 Care Everywhere A complete set of results can be found in Results Review.  Recommendations: No changes and encouraged to call if experiencing any fluid symptoms.  Follow-up plan: ICM clinic phone appointment on2/25/2021. 91 day device clinic remote transmission 04/10/2019.   Copy of ICM check sent to Dr.Allred.    3 month ICM trend: 03/04/2019    1 Year ICM trend:       [name], RN 03/08/2019 11:49 AM

## 2019-03-18 DIAGNOSIS — I5022 Chronic systolic (congestive) heart failure: Secondary | ICD-10-CM | POA: Diagnosis not present

## 2019-03-18 DIAGNOSIS — Z87891 Personal history of nicotine dependence: Secondary | ICD-10-CM | POA: Diagnosis not present

## 2019-03-18 DIAGNOSIS — I509 Heart failure, unspecified: Secondary | ICD-10-CM | POA: Diagnosis not present

## 2019-03-18 DIAGNOSIS — I4891 Unspecified atrial fibrillation: Secondary | ICD-10-CM | POA: Diagnosis not present

## 2019-03-18 DIAGNOSIS — Z299 Encounter for prophylactic measures, unspecified: Secondary | ICD-10-CM | POA: Diagnosis not present

## 2019-03-18 DIAGNOSIS — I10 Essential (primary) hypertension: Secondary | ICD-10-CM | POA: Diagnosis not present

## 2019-03-18 DIAGNOSIS — Z6833 Body mass index (BMI) 33.0-33.9, adult: Secondary | ICD-10-CM | POA: Diagnosis not present

## 2019-03-21 DIAGNOSIS — K439 Ventral hernia without obstruction or gangrene: Secondary | ICD-10-CM | POA: Diagnosis not present

## 2019-03-21 DIAGNOSIS — K469 Unspecified abdominal hernia without obstruction or gangrene: Secondary | ICD-10-CM | POA: Diagnosis not present

## 2019-03-21 DIAGNOSIS — N200 Calculus of kidney: Secondary | ICD-10-CM | POA: Diagnosis not present

## 2019-03-21 DIAGNOSIS — K5730 Diverticulosis of large intestine without perforation or abscess without bleeding: Secondary | ICD-10-CM | POA: Diagnosis not present

## 2019-03-21 DIAGNOSIS — K8020 Calculus of gallbladder without cholecystitis without obstruction: Secondary | ICD-10-CM | POA: Diagnosis not present

## 2019-03-21 DIAGNOSIS — I700 Atherosclerosis of aorta: Secondary | ICD-10-CM | POA: Diagnosis not present

## 2019-03-30 ENCOUNTER — Other Ambulatory Visit: Payer: Self-pay

## 2019-03-30 ENCOUNTER — Ambulatory Visit: Payer: Medicare HMO | Attending: Internal Medicine

## 2019-03-30 DIAGNOSIS — Z23 Encounter for immunization: Secondary | ICD-10-CM

## 2019-03-30 NOTE — Progress Notes (Signed)
   [id number] Vaccination [facility]  Name:  Timothy ANGELO Sr.    MRN: [id number] DOB: 1940-10-21  03/30/2019  Mr. Timothy Fritz was observed post Covid-19 immunization for 30 minutes based on pre-vaccination screening without incidence. He was provided with Vaccine Information Sheet and instruction to access the V-Safe system.   Mr. Timothy Fritz was instructed to call 911 with any severe reactions post vaccine: Marland Kitchen Difficulty breathing  . Swelling of your face and throat  . A fast heartbeat  . A bad rash all over your body  . Dizziness and weakness    Immunizations Administered    Name Date Dose VIS Date Route   Moderna COVID-19 Vaccine 03/30/2019  1:46 PM 0.5 mL 01/15/2019 Intramuscular   Manufacturer: Moderna   Lot: [id number]   [id number]

## 2019-04-09 DIAGNOSIS — I509 Heart failure, unspecified: Secondary | ICD-10-CM | POA: Diagnosis not present

## 2019-04-09 DIAGNOSIS — E7800 Pure hypercholesterolemia, unspecified: Secondary | ICD-10-CM | POA: Diagnosis not present

## 2019-04-10 ENCOUNTER — Ambulatory Visit (INDEPENDENT_AMBULATORY_CARE_PROVIDER_SITE_OTHER): Payer: Medicare HMO | Admitting: *Deleted

## 2019-04-10 DIAGNOSIS — I5042 Chronic combined systolic (congestive) and diastolic (congestive) heart failure: Secondary | ICD-10-CM | POA: Diagnosis not present

## 2019-04-10 LAB — CUP PACEART REMOTE DEVICE CHECK
Battery Remaining Longevity: 6 mo
Battery Remaining Percentage: 7 %
Battery Voltage: 2.65 V
Brady Statistic AP VP Percent: 66 %
Brady Statistic AP VS Percent: 3.9 %
Brady Statistic AS VP Percent: 21 %
Brady Statistic AS VS Percent: 8.3 %
Brady Statistic RA Percent Paced: 13 %
Date Time Interrogation Session: 20210224042900
HighPow Impedance: 66 Ohm
HighPow Impedance: 66 Ohm
Implantable Lead Implant Date: 20140529
Implantable Lead Implant Date: 20140529
Implantable Lead Implant Date: 20140529
Implantable Lead Location: 753858
Implantable Lead Location: 753859
Implantable Lead Location: 753860
Implantable Pulse Generator Implant Date: 20140529
Lead Channel Impedance Value: 360 Ohm
Lead Channel Impedance Value: 450 Ohm
Lead Channel Impedance Value: 680 Ohm
Lead Channel Pacing Threshold Amplitude: 0.75 V
Lead Channel Pacing Threshold Amplitude: 1 V
Lead Channel Pacing Threshold Amplitude: 1.125 V
Lead Channel Pacing Threshold Pulse Width: 0.5 ms
Lead Channel Pacing Threshold Pulse Width: 0.5 ms
Lead Channel Pacing Threshold Pulse Width: 0.5 ms
Lead Channel Sensing Intrinsic Amplitude: 11.7 mV
Lead Channel Sensing Intrinsic Amplitude: 4.1 mV
Lead Channel Setting Pacing Amplitude: 2 V
Lead Channel Setting Pacing Amplitude: 2 V
Lead Channel Setting Pacing Amplitude: 2.125
Lead Channel Setting Pacing Pulse Width: 0.5 ms
Lead Channel Setting Pacing Pulse Width: 0.5 ms
Lead Channel Setting Sensing Sensitivity: 0.5 mV
Pulse Gen Serial Number: 7097390

## 2019-04-10 NOTE — Progress Notes (Signed)
ICD Remote  

## 2019-04-11 ENCOUNTER — Telehealth: Payer: Self-pay | Admitting: Cardiology

## 2019-04-11 ENCOUNTER — Ambulatory Visit (INDEPENDENT_AMBULATORY_CARE_PROVIDER_SITE_OTHER): Payer: Medicare HMO

## 2019-04-11 DIAGNOSIS — I5042 Chronic combined systolic (congestive) and diastolic (congestive) heart failure: Secondary | ICD-10-CM | POA: Diagnosis not present

## 2019-04-11 DIAGNOSIS — K439 Ventral hernia without obstruction or gangrene: Secondary | ICD-10-CM | POA: Diagnosis not present

## 2019-04-11 DIAGNOSIS — Z95810 Presence of automatic (implantable) cardiac defibrillator: Secondary | ICD-10-CM | POA: Diagnosis not present

## 2019-04-11 NOTE — Telephone Encounter (Signed)
Virtual Visit Pre-Appointment Phone Call  "(Name), I am calling you today to discuss your upcoming appointment. We are currently trying to limit exposure to the virus that causes COVID-19 by seeing patients at home rather than in the office."  1. "What is the BEST phone number to call the day of the visit?" - [phone]  2. Do you have or have access to (through a family member/friend) a smartphone with video capability that we can use for your visit?" a. If yes - list this number in appt notes as cell (if different from BEST phone #) and list the appointment type as a VIDEO visit in appointment notes b. If no - list the appointment type as a PHONE visit in appointment notes  3. Confirm consent - "In the setting of the current Covid19 crisis, you are scheduled for a (phone or video) visit with your provider on (date) at (time).  Just as we do with many in-office visits, in order for you to participate in this visit, we must obtain consent.  If you'd like, I can send this to your mychart (if signed up) or email for you to review.  Otherwise, I can obtain your verbal consent now.  All virtual visits are billed to your insurance company just like a normal visit would be.  By agreeing to a virtual visit, we'd like you to understand that the technology does not allow for your provider to perform an examination, and thus may limit your provider's ability to fully assess your condition. If your provider identifies any concerns that need to be evaluated in person, we will make arrangements to do so.  Finally, though the technology is pretty good, we cannot assure that it will always work on either your or our end, and in the setting of a video visit, we may have to convert it to a phone-only visit.  In either situation, we cannot ensure that we have a secure connection.  Are you willing to proceed?" STAFF: Did the patient verbally acknowledge consent to telehealth visit? Document YES/NO here:  YES   4. Advise patient to be prepared - "Two hours prior to your appointment, go ahead and check your blood pressure, pulse, oxygen saturation, and your weight (if you have the equipment to check those) and write them all down. When your visit starts, your provider will ask you for this information. If you have an Apple Watch or Kardia device, please plan to have heart rate information ready on the day of your appointment. Please have a pen and paper handy nearby the day of the visit as well."  5. Give patient instructions for MyChart download to smartphone OR Doximity/Doxy.me as below if video visit (depending on what platform provider is using)  6. Inform patient they will receive a phone call 15 minutes prior to their appointment time (may be from unknown caller ID) so they should be prepared to answer    TELEPHONE CALL NOTE  Timothy Fowler Sr. has been deemed a candidate for a follow-up tele-health visit to limit community exposure during the Covid-19 pandemic. I spoke with the patient via phone to ensure availability of phone/video source, confirm preferred email & phone number, and discuss instructions and expectations.  I reminded Timothy LUNDE Sr. to be prepared with any vital sign and/or heart rhythm information that could potentially be obtained via home monitoring, at the time of his visit. I reminded Timothy Fowler Sr. to expect a phone call prior to  his visit.  Chanda Busing 04/11/2019 3:33 PM   INSTRUCTIONS FOR DOWNLOADING THE MYCHART APP TO SMARTPHONE  - The patient must first make sure to have activated MyChart and know their login information - If Apple, go to [employer] and type in MyChart in the search bar and download the app. If Android, ask patient to go to [employer] and type in Victor in the search bar and download the app. The app is free but as with any other app downloads, their phone may require them to verify saved payment information or  Apple/Android password.  - The patient will need to then log into the app with their MyChart username and password, and select [facility] as their healthcare provider to link the account. When it is time for your visit, go to the MyChart app, find appointments, and click Begin Video Visit. Be sure to Select Allow for your device to access the Microphone and Camera for your visit. You will then be connected, and your provider will be with you shortly.  **If they have any issues connecting, or need assistance please contact MyChart service desk [phone])**  **If using a computer, in order to ensure the best quality for their visit they will need to use either of the following Internet Browsers: [employer], or Google Chrome**  IF USING DOXIMITY or DOXY.ME - The patient will receive a link just prior to their visit by text.     FULL LENGTH CONSENT FOR TELE-HEALTH VISIT   I hereby voluntarily request, consent and authorize [facility] and its employed or contracted physicians, physician assistants, nurse practitioners or other licensed health care professionals (the Practitioner), to provide me with telemedicine health care services (the Services") as deemed necessary by the treating Practitioner. I acknowledge and consent to receive the Services by the Practitioner via telemedicine. I understand that the telemedicine visit will involve communicating with the Practitioner through live audiovisual communication technology and the disclosure of certain medical information by electronic transmission. I acknowledge that I have been given the opportunity to request an in-person assessment or other available alternative prior to the telemedicine visit and am voluntarily participating in the telemedicine visit.  I understand that I have the right to withhold or withdraw my consent to the use of telemedicine in the course of my care at any time, without affecting my right to future care  or treatment, and that the Practitioner or I may terminate the telemedicine visit at any time. I understand that I have the right to inspect all information obtained and/or recorded in the course of the telemedicine visit and may receive copies of available information for a reasonable fee.  I understand that some of the potential risks of receiving the Services via telemedicine include:   Delay or interruption in medical evaluation due to technological equipment failure or disruption;  Information transmitted may not be sufficient (e.g. poor resolution of images) to allow for appropriate medical decision making by the Practitioner; and/or   In rare instances, security protocols could fail, causing a breach of personal health information.  Furthermore, I acknowledge that it is my responsibility to provide information about my medical history, conditions and care that is complete and accurate to the best of my ability. I acknowledge that Practitioner's advice, recommendations, and/or decision may be based on factors not within their control, such as incomplete or inaccurate data provided by me or distortions of diagnostic images or specimens that may result from electronic transmissions. I  understand that the practice of medicine is not an exact science and that Practitioner makes no warranties or guarantees regarding treatment outcomes. I acknowledge that I will receive a copy of this consent concurrently upon execution via email to the email address I last provided but may also request a printed copy by calling the office of [facility].    I understand that my insurance will be billed for this visit.   I have read or had this consent read to me.  I understand the contents of this consent, which adequately explains the benefits and risks of the Services being provided via telemedicine.   I have been provided ample opportunity to ask questions regarding this consent and the Services and have had  my questions answered to my satisfaction.  I give my informed consent for the services to be provided through the use of telemedicine in my medical care  By participating in this telemedicine visit I agree to the above.

## 2019-04-12 ENCOUNTER — Telehealth: Payer: Self-pay

## 2019-04-12 NOTE — Progress Notes (Signed)
EPIC Encounter for ICM Monitoring  Patient Name: Timothy Fritz. is a 79 y.o. male Date: 04/12/2019 Primary Care Physican: [name], MD Primary Cardiologist:McDowell Electrophysiologist: Allred Bi-V Pacing: 80% 1/22/2021Weight:207lbs  AT/AF Burden>99%  Battery Longevity: ~5.8 months    Attempted call to patient and unable to reach.  Left detailed message per DPR regarding transmission. Transmission reviewed.   Corvue thoracic impedance normal.  Prescribed:Furosemide 20 mg 1 tablet dailyas needed.  Labs: 03/02/2018 Creatinine 1.08, BUN 19 Care Everywhere A complete set of results can be found in Results Review.  Recommendations:Unable to reach.    Follow-up plan: ICM clinic phone appointment on3/29/2021.91 day device clinic remote transmission5/26/2021. Office visit 04/19/19 with Dr Domenic Polite   Copy of ICM check sent to Dr.Allred.   3 month ICM trend: 04/10/2019    1 Year ICM trend:       [name], RN 04/12/2019 5:05 PM

## 2019-04-12 NOTE — Telephone Encounter (Signed)
Remote ICM transmission received.  Attempted call to patient regarding ICM remote transmission and left detailed message per DPR.  Advised to return call for any fluid symptoms or questions. Next ICM remote transmission scheduled 05/13/2019.

## 2019-04-16 DIAGNOSIS — Z299 Encounter for prophylactic measures, unspecified: Secondary | ICD-10-CM | POA: Diagnosis not present

## 2019-04-16 DIAGNOSIS — I4891 Unspecified atrial fibrillation: Secondary | ICD-10-CM | POA: Diagnosis not present

## 2019-04-16 DIAGNOSIS — I10 Essential (primary) hypertension: Secondary | ICD-10-CM | POA: Diagnosis not present

## 2019-04-16 DIAGNOSIS — I509 Heart failure, unspecified: Secondary | ICD-10-CM | POA: Diagnosis not present

## 2019-04-16 DIAGNOSIS — Z6833 Body mass index (BMI) 33.0-33.9, adult: Secondary | ICD-10-CM | POA: Diagnosis not present

## 2019-04-16 DIAGNOSIS — G309 Alzheimer's disease, unspecified: Secondary | ICD-10-CM | POA: Diagnosis not present

## 2019-04-17 ENCOUNTER — Ambulatory Visit (INDEPENDENT_AMBULATORY_CARE_PROVIDER_SITE_OTHER): Payer: Medicare HMO

## 2019-04-17 ENCOUNTER — Other Ambulatory Visit: Payer: Self-pay

## 2019-04-17 DIAGNOSIS — I428 Other cardiomyopathies: Secondary | ICD-10-CM | POA: Diagnosis not present

## 2019-04-18 ENCOUNTER — Encounter: Payer: Self-pay | Admitting: Cardiology

## 2019-04-18 NOTE — Progress Notes (Signed)
Virtual Visit via Telephone Note   This visit type was conducted due to national recommendations for restrictions regarding the COVID-19 Pandemic (e.g. social distancing) in an effort to limit this patient's exposure and mitigate transmission in our community.  Due to his co-morbid illnesses, this patient is at least at moderate risk for complications without adequate follow up.  This format is felt to be most appropriate for this patient at this time.  The patient did not have access to video technology/had technical difficulties with video requiring transitioning to audio format only (telephone).  All issues noted in this document were discussed and addressed.  No physical exam could be performed with this format.  Please refer to the patient's chart for his  consent to telehealth for [facility].   The patient was identified using 2 identifiers.  Date:  04/19/2019   ID:  Timothy Fowler Sr., DOB 1941-01-01, MRN [id number]  Patient Location: Home Provider Location: Home  PCP:  [name], MD  Cardiologist:  Rozann Lesches, MD Electrophysiologist:  [name], MD   Evaluation Performed:  Follow-Up Visit  Chief Complaint:  Cardiac follow-up  History of Present Illness:    Timothy Fritz. is a 79 y.o. male last assessed via telehealth encounter in August 2020.  We spoke by phone today.  He tells me that he has been doing about the same in terms of functional capacity, does all ADLs, shops, able to walk up a flight of steps, reports NYHA class II dyspnea.  He is contemplating ventral hernia surgery with cholecystectomy at [facility] in April.  RCRI cardiac risk calculator is class III, 6.6% chance of major adverse cardiac event, intermediate risk.  Recent follow-up echocardiogram showed LVEF 50 to 55% with normal RV contraction and estimated RVSP, mild to moderate mitral regurgitation, and stable mechanical aortic prosthesis with trivial aortic regurgitation.  LVEF has  improved in comparison to prior study in 2019.  He sees Dr. Rayann Heman in the device clinic, [place] CRT-D in place.  Recent thoracic impedance was normal.  Last month device check indicated 6 months battery life - this is being followed by the EP team.  I reviewed his cardiac medications which include Cozaar, Toprol-XL, Lipitor, Lasix, and Coumadin.  Anticoagulation is followed by his PCP Dr. Manuella Ghazi.  The patient does not have symptoms concerning for COVID-19 infection (fever, chills, cough, or new shortness of breath).  He states that he has received the first dose of vaccine.   Past Medical History:  Diagnosis Date  . AICD (automatic cardioverter/defibrillator) present 07/11/2012  . Aortic aneurysm, thoracic ([place])    Fusiform s/p Bentall procedure 2002  . Aortic valve, bicuspid    Mechanical AVR  . Basal cell carcinoma of face   . BPH (benign prostatic hypertrophy)   . Coronary atherosclerosis of native coronary artery    Nonobstructive at cardiac catherization 2002  . Essential hypertension   . Left bundle branch block   . Mixed hyperlipidemia   . Nonischemic cardiomyopathy (HCC)    CRT-D  . Persistent atrial fibrillation [facility])    Past Surgical History:  Procedure Laterality Date  . BENTALL PROCEDURE  2002   Dr Cyndia Bent   . BI-VENTRICULAR IMPLANTABLE CARDIOVERTER DEFIBRILLATOR N/A 07/12/2012   Procedure: BI-VENTRICULAR IMPLANTABLE CARDIOVERTER DEFIBRILLATOR  (CRT-D);  Surgeon: [name], MD;  Location: [facility] CATH LAB;  Service: Cardiovascular;  Laterality: N/A;  . BI-VENTRICULAR IMPLANTABLE CARDIOVERTER DEFIBRILLATOR  (CRT-D)  07/11/2012   [facility]-  Dr. Rayann Heman  . CARDIAC CATHETERIZATION  2002  . CARDIAC VALVE REPLACEMENT     AVR  . CATARACT EXTRACTION Left 01/21/13  . COLONOSCOPY N/A 12/01/2016   Procedure: COLONOSCOPY;  Surgeon: Rogene Houston, MD;  [facility];  Service: Endoscopy;  Laterality: N/A;  2:00  . COLONOSCOPY WITH PROPOFOL N/A  11/08/2017   Procedure: COLONOSCOPY WITH PROPOFOL;  Surgeon: Ronnette Juniper, MD;  Location: [facility];  Service: Gastroenterology;  Laterality: N/A;  . ESOPHAGOGASTRODUODENOSCOPY (EGD) WITH PROPOFOL N/A 11/08/2017   Procedure: ESOPHAGOGASTRODUODENOSCOPY (EGD) WITH PROPOFOL Needs INR<2;  Surgeon: Ronnette Juniper, MD;  Location: [facility];  Service: Gastroenterology;  Laterality: N/A;  . HERNIA REPAIR    . IR ANGIOGRAM SELECTIVE EACH ADDITIONAL VESSEL  11/10/2017  . IR ANGIOGRAM VISCERAL SELECTIVE  11/10/2017  . IR ANGIOGRAM VISCERAL SELECTIVE  11/10/2017  . IR ANGIOGRAM VISCERAL SELECTIVE  11/10/2017  . IR EMBO ART  VEN HEMORR LYMPH EXTRAV  INC GUIDE ROADMAPPING  11/10/2017  . IR US GUIDE VASC ACCESS RIGHT  11/10/2017  . POLYPECTOMY  11/08/2017   Procedure: POLYPECTOMY;  Surgeon: Ronnette Juniper, MD;  Location: [facility] ENDOSCOPY;  Service: Gastroenterology;;  . TONSILLECTOMY AND ADENOIDECTOMY  1949  . UMBILICAL HERNIA REPAIR  09/2017     Current Meds  Medication Sig  . acetaminophen (TYLENOL) 500 MG tablet Take 1,000 mg by mouth every 6 (six) hours as needed for headache (pain).  Marland Kitchen atorvastatin (LIPITOR) 10 MG tablet Take 10 mg by mouth daily at 6 PM.   . cholecalciferol (VITAMIN D) 1000 units tablet Take 1,000 Units by mouth at bedtime.   . furosemide (LASIX) 20 MG tablet Take 20 mg by mouth daily as needed for fluid or edema.   Marland Kitchen losartan (COZAAR) 25 MG tablet Take 0.5 tablets (12.5 mg total) by mouth at bedtime.  . meclizine (ANTIVERT) 25 MG tablet Take 25 mg by mouth 3 (three) times daily as needed for dizziness.  . metoprolol succinate (TOPROL-XL) 100 MG 24 hr tablet TAKE 1 AND 1/2 TABLETS BY MOUTH TWICE DAILY  . Omega-3 Fatty Acids (FISH OIL) 1000 MG CAPS Take 1,000 mg by mouth daily.   Marland Kitchen oxymetazoline (AFRIN) 0.05 % nasal spray Place 1 spray into both nostrils daily as needed for congestion.  . pantoprazole (PROTONIX) 40 MG tablet Take 40 mg by mouth daily.  . phenylephrine (NEO-SYNEPHRINE) 1 % nasal  spray Place 1 drop into both nostrils daily as needed for congestion.  . Tamsulosin HCl (FLOMAX) 0.4 MG CAPS Take 0.4 mg by mouth at bedtime.   . vitamin C (ASCORBIC ACID) 500 MG tablet Take 500 mg by mouth daily.  Marland Kitchen warfarin (COUMADIN) 5 MG tablet Take 5 mg by mouth at bedtime.      Allergies:   Patient has no known allergies.   ROS:  No orthopnea or PND.  Prior CV studies:   The following studies were reviewed today:  Echocardiogram 04/17/2019: 1. Left ventricular ejection fraction, by estimation, is 50 to 55%. The  left ventricle has low normal function. The left ventricle has no regional  wall motion abnormalities. Left ventricular diastolic parameters are  indeterminate.  2. Right ventricular systolic function is normal. The right ventricular  size is normal. There is normal pulmonary artery systolic pressure. The  estimated right ventricular systolic pressure is [id number] mmHg.  3. Left atrial size was mildly dilated.  4. Right atrial size was upper normal.  5. The mitral valve is grossly normal. Mild to moderate mitral valve  regurgitation.  6. Tricuspid valve regurgitation is moderate.  7. The aortic valve has been repaired/replaced. There is a mechanical  prosthesis in postion with grossly normal function. Aortic valve  regurgitation is trivial. Aortic valve mean gradient measures 8.0 mmHg.  8. The inferior vena cava is normal in size with greater than 50%  respiratory variability, suggesting right atrial pressure of 3 mmHg.   Labs/Other Tests and Data Reviewed:    EKG:  An ECG dated 11/19/2017 was personally reviewed today and demonstrated:  Intermittent dual-chamber pacing with PVCs.  Recent Labs:  December 2019: BUN 16, creatinine 0.97, potassium 4.6  Wt Readings from Last 3 Encounters:  04/19/19 206 lb (93.4 kg)  10/19/18 198 lb (89.8 kg)  10/10/18 200 lb (90.7 kg)     Objective:    Vital Signs:  BP 132/80   Pulse 82   Ht 5\' 8"  (1.727 m)   Wt 206 lb  (93.4 kg)   BMI 31.32 kg/m    Patient spoke in full sentences, not short of breath. No audible wheezing or coughing. Speech pattern normal.  ASSESSMENT & PLAN:    1.  History of nonischemic cardiomyopathy, LVEF most recently in the range of 50 to 55% on medical therapy.  Continue Toprol-XL, Cozaar, and Lasix.  2.  [employer] CRT-D in place.  He continues to follow with Dr. Rayann Heman.  Last device check indicated battery life of 6 months.  He has had no device discharges or syncope.  Recent thoracic impedance was normal.  3.  Aortic valve disease status post Bentall procedure with mechanical AVR.  He remains clinically stable, prosthetic aortic valve function normal by recent echocardiogram.  He is on Coumadin with follow-up by Dr. Manuella Ghazi.  4.  Permanent atrial fibrillation/flutter.  He is asymptomatic and on Coumadin as noted above.  5.  Preoperative cardiac assessment prior to anticipated ventral hernia repair and cholecystectomy under general anesthesia at [facility] in April.  He is overall intermediate risk as discussed above and has been clinically stable without progressive heart failure symptoms.  He should be able to proceed without further cardiac testing.  He will need to be bridged with Lovenox while off Coumadin.  This can either be arranged by his PCP or through our anticoagulation clinic.  Time:   Today, I have spent 8 minutes with the patient with telehealth technology discussing the above problems.     Medication Adjustments/Labs and Tests Ordered: Current medicines are reviewed at length with the patient today.  Concerns regarding medicines are outlined above.   Tests Ordered: No orders of the defined types were placed in this encounter.   Medication Changes: No orders of the defined types were placed in this encounter.   Follow Up:  In Person 6 months in the [place] office.  Signed, Rozann Lesches, MD  04/19/2019 9:51 AM    [place]

## 2019-04-19 ENCOUNTER — Telehealth (INDEPENDENT_AMBULATORY_CARE_PROVIDER_SITE_OTHER): Payer: Medicare HMO | Admitting: Cardiology

## 2019-04-19 ENCOUNTER — Encounter: Payer: Self-pay | Admitting: Cardiology

## 2019-04-19 VITALS — BP 132/80 | HR 82 | Ht 68.0 in | Wt 206.0 lb

## 2019-04-19 DIAGNOSIS — I4821 Permanent atrial fibrillation: Secondary | ICD-10-CM | POA: Diagnosis not present

## 2019-04-19 DIAGNOSIS — Z01810 Encounter for preprocedural cardiovascular examination: Secondary | ICD-10-CM

## 2019-04-19 DIAGNOSIS — I428 Other cardiomyopathies: Secondary | ICD-10-CM

## 2019-04-19 DIAGNOSIS — Z95810 Presence of automatic (implantable) cardiac defibrillator: Secondary | ICD-10-CM

## 2019-04-19 DIAGNOSIS — Z954 Presence of other heart-valve replacement: Secondary | ICD-10-CM | POA: Diagnosis not present

## 2019-04-19 NOTE — Patient Instructions (Signed)
Medication Instructions:  °Your physician recommends that you continue on your current medications as directed. Please refer to the Current Medication list given to you today. ° °*If you need a refill on your cardiac medications before your next appointment, please call your pharmacy* ° ° °Lab Work: °None today °If you have labs (blood work) drawn today and your tests are completely normal, you will receive your results only by: °• MyChart Message (if you have MyChart) OR °• A paper copy in the mail °If you have any lab test that is abnormal or we need to change your treatment, we will call you to review the results. ° ° °Testing/Procedures: °None today ° ° °Follow-Up: °At [facility], you and your health needs are our priority.  As part of our continuing mission to provide you with exceptional heart care, we have created designated Provider Care Teams.  These Care Teams include your primary Cardiologist (physician) and Advanced Practice Providers (APPs -  Physician Assistants and Nurse Practitioners) who all work together to provide you with the care you need, when you need it. ° °We recommend signing up for the patient portal called "MyChart".  Sign up information is provided on this After Visit Summary.  MyChart is used to connect with patients for Virtual Visits (Telemedicine).  Patients are able to view lab/test results, encounter notes, upcoming appointments, etc.  Non-urgent messages can be sent to your provider as well.   °To learn more about what you can do with MyChart, go to [email or web address].   ° °Your next appointment:   °6 month(s) ° °The format for your next appointment:   °In Person ° °Provider:   °Samuel McDowell, MD ° ° °Other Instructions °None ° ° ° ° °Thank you for choosing [facility] ! ° ° ° ° ° ° ° ° °

## 2019-04-27 ENCOUNTER — Ambulatory Visit: Payer: Medicare HMO | Attending: Internal Medicine

## 2019-04-27 DIAGNOSIS — Z23 Encounter for immunization: Secondary | ICD-10-CM

## 2019-04-27 NOTE — Progress Notes (Signed)
   [id number] Vaccination Clinic  Name:  Timothy SKEETER Sr.    MRN: [id number] DOB: 03/02/1940  04/27/2019  Mr. Timothy Fritz was observed post Covid-19 immunization for 15 minutes without incident. He was provided with Vaccine Information Sheet and instruction to access the V-Safe system.   Mr. Timothy Fritz was instructed to call 911 with any severe reactions post vaccine: Marland Kitchen Difficulty breathing  . Swelling of face and throat  . A fast heartbeat  . A bad rash all over body  . Dizziness and weakness   Immunizations Administered    Name Date Dose VIS Date Route   Moderna COVID-19 Vaccine 04/27/2019 12:09 PM 0.5 mL 01/15/2019 Intramuscular   Manufacturer: Moderna   Lot: [id number]   [id number]

## 2019-05-08 DIAGNOSIS — I509 Heart failure, unspecified: Secondary | ICD-10-CM | POA: Diagnosis not present

## 2019-05-08 DIAGNOSIS — E7800 Pure hypercholesterolemia, unspecified: Secondary | ICD-10-CM | POA: Diagnosis not present

## 2019-05-13 ENCOUNTER — Ambulatory Visit (INDEPENDENT_AMBULATORY_CARE_PROVIDER_SITE_OTHER): Payer: Medicare HMO

## 2019-05-13 DIAGNOSIS — I5022 Chronic systolic (congestive) heart failure: Secondary | ICD-10-CM | POA: Diagnosis not present

## 2019-05-13 DIAGNOSIS — Z95810 Presence of automatic (implantable) cardiac defibrillator: Secondary | ICD-10-CM | POA: Diagnosis not present

## 2019-05-14 NOTE — Progress Notes (Signed)
EPIC Encounter for ICM Monitoring  Patient Name: Timothy Fritz. is a 79 y.o. male Date: 05/14/2019 Primary Care Physican: [name], MD Primary Cardiologist:McDowell Electrophysiologist: Allred Bi-V Pacing: 80% 3/30/2021Weight:207lbs  AT/AF Burden>99%  Battery Longevity: ~5.8 months    Spoke with patient and he is doing well.  He denies any fluid symptoms  He will be having hernia surgery mid May.  Advised will check fluid levels post surgery.     Corvue thoracic impedance normal.  Prescribed:Furosemide 20 mg 1 tablet dailyas needed.  Labs: 03/02/2018 Creatinine 1.08, BUN 19 Care Everywhere A complete set of results can be found in Results Review.  Recommendations:No changes and encouraged to call if experiencing any fluid symptoms.    Follow-up plan: ICM clinic phone appointment on4/30/2021.91 day device clinic remote transmission5/26/2021.   Copy of ICM check sent to Dr.Allred.  3 month ICM trend: 05/13/2019    1 Year ICM trend:       [name], RN 05/14/2019 3:56 PM

## 2019-05-16 DIAGNOSIS — Z299 Encounter for prophylactic measures, unspecified: Secondary | ICD-10-CM | POA: Diagnosis not present

## 2019-05-16 DIAGNOSIS — Z87891 Personal history of nicotine dependence: Secondary | ICD-10-CM | POA: Diagnosis not present

## 2019-05-16 DIAGNOSIS — Z6834 Body mass index (BMI) 34.0-34.9, adult: Secondary | ICD-10-CM | POA: Diagnosis not present

## 2019-05-16 DIAGNOSIS — I4891 Unspecified atrial fibrillation: Secondary | ICD-10-CM | POA: Diagnosis not present

## 2019-05-16 DIAGNOSIS — K439 Ventral hernia without obstruction or gangrene: Secondary | ICD-10-CM | POA: Diagnosis not present

## 2019-05-16 DIAGNOSIS — G309 Alzheimer's disease, unspecified: Secondary | ICD-10-CM | POA: Diagnosis not present

## 2019-05-16 DIAGNOSIS — I10 Essential (primary) hypertension: Secondary | ICD-10-CM | POA: Diagnosis not present

## 2019-05-16 DIAGNOSIS — F0280 Dementia in other diseases classified elsewhere without behavioral disturbance: Secondary | ICD-10-CM | POA: Diagnosis not present

## 2019-05-18 ENCOUNTER — Other Ambulatory Visit: Payer: Self-pay | Admitting: Cardiology

## 2019-06-13 ENCOUNTER — Ambulatory Visit (INDEPENDENT_AMBULATORY_CARE_PROVIDER_SITE_OTHER): Payer: Medicare HMO | Admitting: *Deleted

## 2019-06-13 DIAGNOSIS — I5022 Chronic systolic (congestive) heart failure: Secondary | ICD-10-CM

## 2019-06-13 DIAGNOSIS — I5042 Chronic combined systolic (congestive) and diastolic (congestive) heart failure: Secondary | ICD-10-CM

## 2019-06-13 LAB — CUP PACEART REMOTE DEVICE CHECK
Battery Remaining Longevity: 6 mo
Battery Remaining Percentage: 7 %
Battery Voltage: 2.65 V
Brady Statistic AP VP Percent: 66 %
Brady Statistic AP VS Percent: 3.9 %
Brady Statistic AS VP Percent: 21 %
Brady Statistic AS VS Percent: 8.3 %
Brady Statistic RA Percent Paced: 12 %
Date Time Interrogation Session: 20210429020016
HighPow Impedance: 66 Ohm
HighPow Impedance: 66 Ohm
Implantable Lead Implant Date: 20140529
Implantable Lead Implant Date: 20140529
Implantable Lead Implant Date: 20140529
Implantable Lead Location: 753858
Implantable Lead Location: 753859
Implantable Lead Location: 753860
Implantable Pulse Generator Implant Date: 20140529
Lead Channel Impedance Value: 350 Ohm
Lead Channel Impedance Value: 430 Ohm
Lead Channel Impedance Value: 650 Ohm
Lead Channel Pacing Threshold Amplitude: 0.75 V
Lead Channel Pacing Threshold Amplitude: 1 V
Lead Channel Pacing Threshold Amplitude: 1 V
Lead Channel Pacing Threshold Pulse Width: 0.5 ms
Lead Channel Pacing Threshold Pulse Width: 0.5 ms
Lead Channel Pacing Threshold Pulse Width: 0.5 ms
Lead Channel Sensing Intrinsic Amplitude: 11.7 mV
Lead Channel Sensing Intrinsic Amplitude: 4.1 mV
Lead Channel Setting Pacing Amplitude: 2 V
Lead Channel Setting Pacing Amplitude: 2 V
Lead Channel Setting Pacing Amplitude: 2 V
Lead Channel Setting Pacing Pulse Width: 0.5 ms
Lead Channel Setting Pacing Pulse Width: 0.5 ms
Lead Channel Setting Sensing Sensitivity: 0.5 mV
Pulse Gen Serial Number: 7097390

## 2019-06-14 ENCOUNTER — Ambulatory Visit (INDEPENDENT_AMBULATORY_CARE_PROVIDER_SITE_OTHER): Payer: Medicare HMO

## 2019-06-14 DIAGNOSIS — I509 Heart failure, unspecified: Secondary | ICD-10-CM | POA: Diagnosis not present

## 2019-06-14 DIAGNOSIS — E7800 Pure hypercholesterolemia, unspecified: Secondary | ICD-10-CM | POA: Diagnosis not present

## 2019-06-14 DIAGNOSIS — Z95810 Presence of automatic (implantable) cardiac defibrillator: Secondary | ICD-10-CM | POA: Diagnosis not present

## 2019-06-14 DIAGNOSIS — I5022 Chronic systolic (congestive) heart failure: Secondary | ICD-10-CM | POA: Diagnosis not present

## 2019-06-17 DIAGNOSIS — I5022 Chronic systolic (congestive) heart failure: Secondary | ICD-10-CM | POA: Diagnosis not present

## 2019-06-17 DIAGNOSIS — Z299 Encounter for prophylactic measures, unspecified: Secondary | ICD-10-CM | POA: Diagnosis not present

## 2019-06-17 DIAGNOSIS — I4891 Unspecified atrial fibrillation: Secondary | ICD-10-CM | POA: Diagnosis not present

## 2019-06-17 DIAGNOSIS — I10 Essential (primary) hypertension: Secondary | ICD-10-CM | POA: Diagnosis not present

## 2019-06-17 NOTE — Progress Notes (Signed)
EPIC Encounter for ICM Monitoring  Patient Name: Timothy Fritz. is a 79 y.o. male Date: 06/17/2019 Primary Care Physican: [name], MD Primary Cardiologist:McDowell Electrophysiologist: Allred Bi-V Pacing: 81% 5/3/2021Weight:207lbs  AT/AF Burden>99%  Battery Longevity: ~5.31months    Spoke with patient and he is doing well.  He denies any fluid symptoms  He will be having hernia surgery 5/19 and will be inpatient for several days. Advised will check fluid levels post surgery.    Corvue thoracic impedance normal.  Prescribed:Furosemide 20 mg 1 tablet dailyas needed.  Labs: 03/02/2018 Creatinine 1.08, BUN 19 Care Everywhere A complete set of results can be found in Results Review.  Recommendations:No changes and encouraged to call if experiencing any fluid symptoms.  Follow-up plan: ICM clinic phone appointment on 07/10/2019 to recheck fluid levels after surgery.91 day device clinic remote transmission5/26/2021.   Copy of ICM check sent to Dr.Allred.  3 month ICM trend: 06/13/2019    1 Year ICM trend:       [name], RN 06/17/2019 5:16 PM

## 2019-06-26 ENCOUNTER — Telehealth: Payer: Self-pay | Admitting: Cardiology

## 2019-06-26 NOTE — Telephone Encounter (Signed)
Per phone call from Judson Roch w/ [facility]-- pt is scheduled to have hernia surgery on Wednesday 07/03/2019..   pt was told to hold coumadin by Dr. Manuella Ghazi for 5 days prior  Judson Roch reviewed the note from Dr. Domenic Polite stating pt would need a Lovenox Bridge--    Please give Judson Roch a call @ [phone] to let her know what pt needs to do and who will set up the Lovenox Bridge

## 2019-06-26 NOTE — Telephone Encounter (Signed)
Reviewed with Dr Domenic Polite.  Pt does need Lovenox bridge while holding warfarin for hernia repair.  Dr Manuella Ghazi manages pt's warfarin.  Called pt to discuss need for lovenox.  He states Dr Manuella Ghazi sent him in a Rx for lovenox and he has it at home.  Verified to take last dose of warfarin on 5/13 and start Lovenox on 5/15 as instructed by Dr Manuella Ghazi.  States be will be in hospital 3-4 days post surgery.  Discharge lovenox instructions per hospital d/c.  Pt verbalized understanding.  Called Sarah back at [facility] with above message and instructions.  Left call back number incase she has any more questions.

## 2019-06-28 DIAGNOSIS — I4891 Unspecified atrial fibrillation: Secondary | ICD-10-CM | POA: Diagnosis not present

## 2019-06-28 DIAGNOSIS — I5042 Chronic combined systolic (congestive) and diastolic (congestive) heart failure: Secondary | ICD-10-CM | POA: Diagnosis not present

## 2019-06-28 DIAGNOSIS — Z01818 Encounter for other preprocedural examination: Secondary | ICD-10-CM | POA: Diagnosis not present

## 2019-06-28 DIAGNOSIS — Z954 Presence of other heart-valve replacement: Secondary | ICD-10-CM | POA: Diagnosis not present

## 2019-06-28 DIAGNOSIS — I428 Other cardiomyopathies: Secondary | ICD-10-CM | POA: Diagnosis not present

## 2019-06-28 DIAGNOSIS — I2510 Atherosclerotic heart disease of native coronary artery without angina pectoris: Secondary | ICD-10-CM | POA: Diagnosis not present

## 2019-06-28 DIAGNOSIS — Z95810 Presence of automatic (implantable) cardiac defibrillator: Secondary | ICD-10-CM | POA: Diagnosis not present

## 2019-07-03 DIAGNOSIS — Z48815 Encounter for surgical aftercare following surgery on the digestive system: Secondary | ICD-10-CM | POA: Diagnosis not present

## 2019-07-03 DIAGNOSIS — K8020 Calculus of gallbladder without cholecystitis without obstruction: Secondary | ICD-10-CM | POA: Diagnosis not present

## 2019-07-03 DIAGNOSIS — R918 Other nonspecific abnormal finding of lung field: Secondary | ICD-10-CM | POA: Diagnosis not present

## 2019-07-03 DIAGNOSIS — R41841 Cognitive communication deficit: Secondary | ICD-10-CM | POA: Diagnosis not present

## 2019-07-03 DIAGNOSIS — I5042 Chronic combined systolic (congestive) and diastolic (congestive) heart failure: Secondary | ICD-10-CM | POA: Diagnosis not present

## 2019-07-03 DIAGNOSIS — J9610 Chronic respiratory failure, unspecified whether with hypoxia or hypercapnia: Secondary | ICD-10-CM | POA: Diagnosis not present

## 2019-07-03 DIAGNOSIS — R000 Tachycardia, unspecified: Secondary | ICD-10-CM | POA: Diagnosis not present

## 2019-07-03 DIAGNOSIS — K430 Incisional hernia with obstruction, without gangrene: Secondary | ICD-10-CM | POA: Diagnosis not present

## 2019-07-03 DIAGNOSIS — R0902 Hypoxemia: Secondary | ICD-10-CM | POA: Diagnosis not present

## 2019-07-03 DIAGNOSIS — R0989 Other specified symptoms and signs involving the circulatory and respiratory systems: Secondary | ICD-10-CM | POA: Diagnosis not present

## 2019-07-03 DIAGNOSIS — R0602 Shortness of breath: Secondary | ICD-10-CM | POA: Diagnosis not present

## 2019-07-03 DIAGNOSIS — N401 Enlarged prostate with lower urinary tract symptoms: Secondary | ICD-10-CM | POA: Diagnosis not present

## 2019-07-03 DIAGNOSIS — K439 Ventral hernia without obstruction or gangrene: Secondary | ICD-10-CM | POA: Diagnosis not present

## 2019-07-03 DIAGNOSIS — I480 Paroxysmal atrial fibrillation: Secondary | ICD-10-CM | POA: Diagnosis not present

## 2019-07-03 DIAGNOSIS — K436 Other and unspecified ventral hernia with obstruction, without gangrene: Secondary | ICD-10-CM | POA: Diagnosis not present

## 2019-07-03 DIAGNOSIS — I130 Hypertensive heart and chronic kidney disease with heart failure and stage 1 through stage 4 chronic kidney disease, or unspecified chronic kidney disease: Secondary | ICD-10-CM | POA: Diagnosis not present

## 2019-07-03 DIAGNOSIS — N179 Acute kidney failure, unspecified: Secondary | ICD-10-CM | POA: Diagnosis not present

## 2019-07-03 DIAGNOSIS — F0390 Unspecified dementia without behavioral disturbance: Secondary | ICD-10-CM | POA: Diagnosis not present

## 2019-07-03 DIAGNOSIS — I428 Other cardiomyopathies: Secondary | ICD-10-CM | POA: Diagnosis not present

## 2019-07-03 DIAGNOSIS — I10 Essential (primary) hypertension: Secondary | ICD-10-CM | POA: Diagnosis not present

## 2019-07-03 DIAGNOSIS — M6281 Muscle weakness (generalized): Secondary | ICD-10-CM | POA: Diagnosis not present

## 2019-07-03 DIAGNOSIS — I429 Cardiomyopathy, unspecified: Secondary | ICD-10-CM | POA: Diagnosis not present

## 2019-07-03 DIAGNOSIS — I4891 Unspecified atrial fibrillation: Secondary | ICD-10-CM | POA: Diagnosis not present

## 2019-07-03 DIAGNOSIS — R338 Other retention of urine: Secondary | ICD-10-CM | POA: Diagnosis not present

## 2019-07-03 DIAGNOSIS — N189 Chronic kidney disease, unspecified: Secondary | ICD-10-CM | POA: Diagnosis not present

## 2019-07-03 DIAGNOSIS — R2689 Other abnormalities of gait and mobility: Secondary | ICD-10-CM | POA: Diagnosis not present

## 2019-07-03 DIAGNOSIS — K660 Peritoneal adhesions (postprocedural) (postinfection): Secondary | ICD-10-CM | POA: Diagnosis not present

## 2019-07-03 DIAGNOSIS — I4892 Unspecified atrial flutter: Secondary | ICD-10-CM | POA: Diagnosis not present

## 2019-07-03 DIAGNOSIS — K469 Unspecified abdominal hernia without obstruction or gangrene: Secondary | ICD-10-CM | POA: Diagnosis not present

## 2019-07-03 DIAGNOSIS — R0600 Dyspnea, unspecified: Secondary | ICD-10-CM | POA: Diagnosis not present

## 2019-07-03 DIAGNOSIS — Z952 Presence of prosthetic heart valve: Secondary | ICD-10-CM | POA: Diagnosis not present

## 2019-07-03 DIAGNOSIS — J9811 Atelectasis: Secondary | ICD-10-CM | POA: Diagnosis not present

## 2019-07-03 DIAGNOSIS — Z95810 Presence of automatic (implantable) cardiac defibrillator: Secondary | ICD-10-CM | POA: Diagnosis not present

## 2019-07-03 DIAGNOSIS — I2510 Atherosclerotic heart disease of native coronary artery without angina pectoris: Secondary | ICD-10-CM | POA: Diagnosis not present

## 2019-07-11 ENCOUNTER — Telehealth: Payer: Self-pay

## 2019-07-11 NOTE — Telephone Encounter (Signed)
Spoke with patient to remind of missed remote transmission. Patient states he's currently admitted in hospital. Discharge date is not known at this time.

## 2019-07-13 DIAGNOSIS — Y846 Urinary catheterization as the cause of abnormal reaction of the patient, or of later complication, without mention of misadventure at the time of the procedure: Secondary | ICD-10-CM | POA: Diagnosis not present

## 2019-07-13 DIAGNOSIS — R7889 Finding of other specified substances, not normally found in blood: Secondary | ICD-10-CM | POA: Diagnosis not present

## 2019-07-13 DIAGNOSIS — Z48815 Encounter for surgical aftercare following surgery on the digestive system: Secondary | ICD-10-CM | POA: Diagnosis not present

## 2019-07-13 DIAGNOSIS — F0390 Unspecified dementia without behavioral disturbance: Secondary | ICD-10-CM | POA: Diagnosis not present

## 2019-07-13 DIAGNOSIS — R41841 Cognitive communication deficit: Secondary | ICD-10-CM | POA: Diagnosis not present

## 2019-07-13 DIAGNOSIS — I4819 Other persistent atrial fibrillation: Secondary | ICD-10-CM | POA: Diagnosis not present

## 2019-07-13 DIAGNOSIS — I10 Essential (primary) hypertension: Secondary | ICD-10-CM | POA: Diagnosis not present

## 2019-07-13 DIAGNOSIS — I110 Hypertensive heart disease with heart failure: Secondary | ICD-10-CM | POA: Diagnosis not present

## 2019-07-13 DIAGNOSIS — Z79899 Other long term (current) drug therapy: Secondary | ICD-10-CM | POA: Diagnosis not present

## 2019-07-13 DIAGNOSIS — R52 Pain, unspecified: Secondary | ICD-10-CM | POA: Diagnosis not present

## 2019-07-13 DIAGNOSIS — Z7901 Long term (current) use of anticoagulants: Secondary | ICD-10-CM | POA: Diagnosis not present

## 2019-07-13 DIAGNOSIS — R339 Retention of urine, unspecified: Secondary | ICD-10-CM | POA: Diagnosis not present

## 2019-07-13 DIAGNOSIS — Z85828 Personal history of other malignant neoplasm of skin: Secondary | ICD-10-CM | POA: Diagnosis not present

## 2019-07-13 DIAGNOSIS — Z7401 Bed confinement status: Secondary | ICD-10-CM | POA: Diagnosis not present

## 2019-07-13 DIAGNOSIS — Z466 Encounter for fitting and adjustment of urinary device: Secondary | ICD-10-CM | POA: Diagnosis not present

## 2019-07-13 DIAGNOSIS — D62 Acute posthemorrhagic anemia: Secondary | ICD-10-CM | POA: Diagnosis not present

## 2019-07-13 DIAGNOSIS — R531 Weakness: Secondary | ICD-10-CM | POA: Diagnosis not present

## 2019-07-13 DIAGNOSIS — R319 Hematuria, unspecified: Secondary | ICD-10-CM | POA: Diagnosis not present

## 2019-07-13 DIAGNOSIS — N401 Enlarged prostate with lower urinary tract symptoms: Secondary | ICD-10-CM | POA: Diagnosis not present

## 2019-07-13 DIAGNOSIS — D075 Carcinoma in situ of prostate: Secondary | ICD-10-CM | POA: Diagnosis not present

## 2019-07-13 DIAGNOSIS — R011 Cardiac murmur, unspecified: Secondary | ICD-10-CM | POA: Diagnosis not present

## 2019-07-13 DIAGNOSIS — T8383XA Hemorrhage of genitourinary prosthetic devices, implants and grafts, initial encounter: Secondary | ICD-10-CM | POA: Diagnosis not present

## 2019-07-13 DIAGNOSIS — I5022 Chronic systolic (congestive) heart failure: Secondary | ICD-10-CM | POA: Diagnosis not present

## 2019-07-13 DIAGNOSIS — M6281 Muscle weakness (generalized): Secondary | ICD-10-CM | POA: Diagnosis not present

## 2019-07-13 DIAGNOSIS — R310 Gross hematuria: Secondary | ICD-10-CM | POA: Diagnosis not present

## 2019-07-13 DIAGNOSIS — I4891 Unspecified atrial fibrillation: Secondary | ICD-10-CM | POA: Diagnosis not present

## 2019-07-13 DIAGNOSIS — Z20822 Contact with and (suspected) exposure to covid-19: Secondary | ICD-10-CM | POA: Diagnosis not present

## 2019-07-13 DIAGNOSIS — I5030 Unspecified diastolic (congestive) heart failure: Secondary | ICD-10-CM | POA: Diagnosis not present

## 2019-07-13 DIAGNOSIS — R58 Hemorrhage, not elsewhere classified: Secondary | ICD-10-CM | POA: Diagnosis not present

## 2019-07-13 DIAGNOSIS — R972 Elevated prostate specific antigen [PSA]: Secondary | ICD-10-CM | POA: Diagnosis not present

## 2019-07-13 DIAGNOSIS — R2689 Other abnormalities of gait and mobility: Secondary | ICD-10-CM | POA: Diagnosis not present

## 2019-07-13 DIAGNOSIS — I447 Left bundle-branch block, unspecified: Secondary | ICD-10-CM | POA: Diagnosis not present

## 2019-07-13 DIAGNOSIS — I2510 Atherosclerotic heart disease of native coronary artery without angina pectoris: Secondary | ICD-10-CM | POA: Diagnosis not present

## 2019-07-13 DIAGNOSIS — Z5181 Encounter for therapeutic drug level monitoring: Secondary | ICD-10-CM | POA: Diagnosis not present

## 2019-07-13 DIAGNOSIS — R718 Other abnormality of red blood cells: Secondary | ICD-10-CM | POA: Diagnosis not present

## 2019-07-13 DIAGNOSIS — K439 Ventral hernia without obstruction or gangrene: Secondary | ICD-10-CM | POA: Diagnosis not present

## 2019-07-13 DIAGNOSIS — I5032 Chronic diastolic (congestive) heart failure: Secondary | ICD-10-CM | POA: Diagnosis not present

## 2019-07-13 DIAGNOSIS — Z95810 Presence of automatic (implantable) cardiac defibrillator: Secondary | ICD-10-CM | POA: Diagnosis not present

## 2019-07-13 DIAGNOSIS — I5042 Chronic combined systolic (congestive) and diastolic (congestive) heart failure: Secondary | ICD-10-CM | POA: Diagnosis not present

## 2019-07-13 DIAGNOSIS — Z87891 Personal history of nicotine dependence: Secondary | ICD-10-CM | POA: Diagnosis not present

## 2019-07-13 DIAGNOSIS — R000 Tachycardia, unspecified: Secondary | ICD-10-CM | POA: Diagnosis not present

## 2019-07-13 DIAGNOSIS — R338 Other retention of urine: Secondary | ICD-10-CM | POA: Diagnosis not present

## 2019-07-15 ENCOUNTER — Emergency Department (HOSPITAL_COMMUNITY)
Admission: EM | Admit: 2019-07-15 | Discharge: 2019-07-15 | Disposition: A | Discharge status: Home / Self Care | Payer: Medicare HMO | Attending: Emergency Medicine | Admitting: Emergency Medicine

## 2019-07-15 ENCOUNTER — Other Ambulatory Visit: Payer: Self-pay

## 2019-07-15 ENCOUNTER — Encounter (HOSPITAL_COMMUNITY): Payer: Self-pay | Admitting: Emergency Medicine

## 2019-07-15 DIAGNOSIS — I4819 Other persistent atrial fibrillation: Secondary | ICD-10-CM | POA: Diagnosis not present

## 2019-07-15 DIAGNOSIS — R339 Retention of urine, unspecified: Secondary | ICD-10-CM | POA: Insufficient documentation

## 2019-07-15 DIAGNOSIS — Z85828 Personal history of other malignant neoplasm of skin: Secondary | ICD-10-CM | POA: Diagnosis not present

## 2019-07-15 DIAGNOSIS — I2510 Atherosclerotic heart disease of native coronary artery without angina pectoris: Secondary | ICD-10-CM | POA: Diagnosis not present

## 2019-07-15 DIAGNOSIS — I5042 Chronic combined systolic (congestive) and diastolic (congestive) heart failure: Secondary | ICD-10-CM | POA: Diagnosis not present

## 2019-07-15 DIAGNOSIS — Z7901 Long term (current) use of anticoagulants: Secondary | ICD-10-CM | POA: Diagnosis not present

## 2019-07-15 DIAGNOSIS — I110 Hypertensive heart disease with heart failure: Secondary | ICD-10-CM | POA: Insufficient documentation

## 2019-07-15 DIAGNOSIS — Z95810 Presence of automatic (implantable) cardiac defibrillator: Secondary | ICD-10-CM | POA: Insufficient documentation

## 2019-07-15 DIAGNOSIS — Z87891 Personal history of nicotine dependence: Secondary | ICD-10-CM | POA: Insufficient documentation

## 2019-07-15 DIAGNOSIS — Z79899 Other long term (current) drug therapy: Secondary | ICD-10-CM | POA: Diagnosis not present

## 2019-07-15 LAB — URINALYSIS, ROUTINE W REFLEX MICROSCOPIC

## 2019-07-15 LAB — URINALYSIS, MICROSCOPIC (REFLEX)
RBC / HPF: >50 RBC/hpf (ref 0–5)
Squamous Epithelial / LPF: NONE SEEN (ref 0–5)

## 2019-07-15 MED ORDER — CEPHALEXIN 500 MG PO CAPS: 500.0000 mg | ORAL_CAPSULE | Freq: Four times a day (QID) | ORAL | 0 refills | Status: DC

## 2019-07-15 NOTE — ED Triage Notes (Addendum)
PT brought in by [facility] from [place] facility for complaint of hematuria starting yesterday and now retention. PT states hasn't been able to urinate today and feels a great amount of pressure in his bladder with pain. Nursing at [facility] reports then held his lovenox per on call MD order but still had coumadin last night. PT was newly admitted to the nursing home on 07/13/19 after an abdominal herina repair surgery.

## 2019-07-15 NOTE — Discharge Instructions (Addendum)
You will need to keep the Foley catheter in place until you see Dr. Louis Meckel with urology.  Start the antibiotic today.  Call Dr. Carlton Adam office tomorrow to arrange a follow-up appointment for this week.  Return to the emergency department for any worsening symptoms.

## 2019-07-15 NOTE — ED Provider Notes (Signed)
[facility] EMERGENCY DEPARTMENT Provider Note   CSN: [id number] Arrival date & time: 07/15/19  1202     History Chief Complaint  Patient presents with  . Urinary Retention  . Hematuria    Timothy TAINTER Sr. is a 79 y.o. male.  HPI      Timothy LIPE Sr. is a 79 y.o. male with past medical history of atrial fibrillation, hypertension, mechanical aortic valve, CHF and cardiac defibrillator who presents to the Emergency Department and resides at [place] facility with hematuria and urinary retention.  He was admitted to the nursing home on 07/13/2019 after abdominal hernia repair surgery.  He states that he noticed pressure in his bladder and difficulty urinating since last evening.  He was unable to urinate this morning and increasing pain.  He was sent to the emergency department for further evaluation.  He is currently taking Coumadin and last dose was last evening.  He denies fever, chills, chest pain, shortness of breath, nausea or vomiting.  No flank or back pain.    Past Medical History:  Diagnosis Date  . AICD (automatic cardioverter/defibrillator) present 07/11/2012  . Aortic aneurysm, thoracic ([place])    Fusiform s/p Bentall procedure 2002  . Aortic valve, bicuspid    Mechanical AVR  . Basal cell carcinoma of face   . BPH (benign prostatic hypertrophy)   . Coronary atherosclerosis of native coronary artery    Nonobstructive at cardiac catherization 2002  . Essential hypertension   . Left bundle branch block   . Mixed hyperlipidemia   . Nonischemic cardiomyopathy (HCC)    CRT-D  . Persistent atrial fibrillation [facility])     Patient Active Problem List   Diagnosis Date Noted  . GI bleed 11/05/2017  . Supratherapeutic INR 11/05/2017  . Acute blood loss anemia 11/05/2017  . Guaiac positive stools 08/16/2016  . ICD (implantable cardioverter-defibrillator) in place 02/19/2014  . Chronic combined systolic and diastolic heart failure ([place]) 07/02/2012    . Left bundle branch block 07/02/2012  . Long term (current) use of anticoagulants 04/10/2012  . S/P aortic valve replacement with metallic valve [id number]  . CORONARY ATHEROSCLEROSIS NATIVE CORONARY ARTERY 01/02/2009  . Nonischemic cardiomyopathy ([place]) 01/02/2009  . Essential hypertension, benign 10/08/2008  . Mixed hyperlipidemia 04/02/2008  . Atrial fibrillation with rapid ventricular response (HCC) -status post cardioversion 04/02/2008    Past Surgical History:  Procedure Laterality Date  . BENTALL PROCEDURE  2002   Dr Cyndia Bent   . BI-VENTRICULAR IMPLANTABLE CARDIOVERTER DEFIBRILLATOR N/A 07/12/2012   Procedure: BI-VENTRICULAR IMPLANTABLE CARDIOVERTER DEFIBRILLATOR  (CRT-D);  Surgeon: [name], MD;  Location: [facility] CATH LAB;  Service: Cardiovascular;  Laterality: N/A;  . BI-VENTRICULAR IMPLANTABLE CARDIOVERTER DEFIBRILLATOR  (CRT-D)  07/11/2012   [facility]- Dr. Rayann Heman  . CARDIAC CATHETERIZATION  2002  . CARDIAC VALVE REPLACEMENT     AVR  . CATARACT EXTRACTION Left 01/21/13  . COLONOSCOPY N/A 12/01/2016   Procedure: COLONOSCOPY;  Surgeon: Rogene Houston, MD;  [facility];  Service: Endoscopy;  Laterality: N/A;  2:00  . COLONOSCOPY WITH PROPOFOL N/A 11/08/2017   Procedure: COLONOSCOPY WITH PROPOFOL;  Surgeon: Ronnette Juniper, MD;  Location: [place];  Service: Gastroenterology;  Laterality: N/A;  . ESOPHAGOGASTRODUODENOSCOPY (EGD) WITH PROPOFOL N/A 11/08/2017   Procedure: ESOPHAGOGASTRODUODENOSCOPY (EGD) WITH PROPOFOL Needs INR<2;  Surgeon: Ronnette Juniper, MD;  Location: [place];  Service: Gastroenterology;  Laterality: N/A;  . HERNIA REPAIR    . IR ANGIOGRAM SELECTIVE EACH ADDITIONAL  VESSEL  11/10/2017  . IR ANGIOGRAM VISCERAL SELECTIVE  11/10/2017  . IR ANGIOGRAM VISCERAL SELECTIVE  11/10/2017  . IR ANGIOGRAM VISCERAL SELECTIVE  11/10/2017  . IR EMBO ART  VEN HEMORR LYMPH EXTRAV  INC GUIDE ROADMAPPING  11/10/2017  . IR US GUIDE VASC ACCESS RIGHT   11/10/2017  . POLYPECTOMY  11/08/2017   Procedure: POLYPECTOMY;  Surgeon: Ronnette Juniper, MD;  Location: [facility] ENDOSCOPY;  Service: Gastroenterology;;  . TONSILLECTOMY AND ADENOIDECTOMY  1949  . UMBILICAL HERNIA REPAIR  09/2017       Family History  Problem Relation Age of Onset  . Coronary artery disease Father     Social History   Tobacco Use  . Smoking status: Former Smoker    Packs/day: 1.00    Years: 41.00    Pack years: 41.00    Types: Cigarettes    Quit date: 02/15/2000    Years since quitting: 19.4  . Smokeless tobacco: Former Systems developer    Types: Snuff, Chew  . Tobacco comment: 11/06/2017 "used chew and snuff in my teens"  Substance Use Topics  . Alcohol use: Yes    Comment: 11/06/2017  "2-3 glasses of wine/month"  . Drug use: Never    Home Medications Prior to Admission medications   Medication Sig Start Date End Date Taking? Authorizing Provider  acetaminophen (TYLENOL) 500 MG tablet Take 1,000 mg by mouth every 6 (six) hours as needed for headache (pain).    [provider]  atorvastatin (LIPITOR) 10 MG tablet Take 10 mg by mouth daily at 6 PM.     [provider]  cephALEXin (KEFLEX) 500 MG capsule Take 1 capsule (500 mg total) by mouth 4 (four) times daily. 07/15/19   Arlyce Circle, PA-C  cholecalciferol (VITAMIN D) 1000 units tablet Take 1,000 Units by mouth at bedtime.     [provider]  furosemide (LASIX) 20 MG tablet Take 20 mg by mouth daily as needed for fluid or edema.     [provider]  losartan (COZAAR) 25 MG tablet TAKE 1/2 TABLET BY MOUTH AT BEDTIME (Cut tabs in half) 05/20/19   Satira Sark, MD  meclizine (ANTIVERT) 25 MG tablet Take 25 mg by mouth 3 (three) times daily as needed for dizziness.    [provider]  metoprolol succinate (TOPROL-XL) 100 MG 24 hr tablet TAKE 1 AND 1/2 TABLETS BY MOUTH TWICE DAILY 02/18/19   Satira Sark, MD  Omega-3 Fatty Acids (FISH OIL) 1000 MG CAPS Take 1,000 mg by mouth  daily.     [provider]  oxymetazoline (AFRIN) 0.05 % nasal spray Place 1 spray into both nostrils daily as needed for congestion.    [provider]  pantoprazole (PROTONIX) 40 MG tablet Take 40 mg by mouth daily.    [provider]  phenylephrine (NEO-SYNEPHRINE) 1 % nasal spray Place 1 drop into both nostrils daily as needed for congestion.    [provider]  Tamsulosin HCl (FLOMAX) 0.4 MG CAPS Take 0.4 mg by mouth at bedtime.     [provider]  vitamin C (ASCORBIC ACID) 500 MG tablet Take 500 mg by mouth daily.    [provider]  warfarin (COUMADIN) 5 MG tablet Take 5 mg by mouth at bedtime.     [provider]    Allergies    Patient has no known allergies.  Review of Systems   Review of Systems  Constitutional: Negative for chills and fever.  Respiratory: Negative  for cough and shortness of breath.   Cardiovascular: Negative for chest pain and leg swelling.  Gastrointestinal: Negative for abdominal pain, diarrhea, nausea and vomiting.  Genitourinary: Positive for decreased urine volume, difficulty urinating and hematuria. Negative for flank pain, penile pain, penile swelling and testicular pain.  Skin: Negative for rash.  Neurological: Negative for dizziness, syncope, weakness and headaches.    Physical Exam Updated Vital Signs BP (!) 90/56   Pulse 95   Temp 97.8 F (36.6 C) (Oral)   Resp 18   Ht 5\' 7"  (1.702 m)   Wt 96.2 kg   SpO2 96%   BMI 33.20 kg/m   Physical Exam Vitals and nursing note reviewed.  Constitutional:      Appearance: Normal appearance. He is not ill-appearing or toxic-appearing.  HENT:     Mouth/Throat:     Mouth: Mucous membranes are moist.  Cardiovascular:     Rate and Rhythm: Normal rate and regular rhythm.     Pulses: Normal pulses.     Comments: Mechanical heart sounds present Pulmonary:     Effort: Pulmonary effort is normal.     Breath sounds: Normal breath sounds.    Chest:     Chest wall: No tenderness.  Abdominal:     Palpations: Abdomen is soft.     Tenderness: There is no abdominal tenderness (Patient has midline incision from area just below diaphragm down to the suprapubic area.  Incision appears to be healing well.  No wound dehiscence, surrounding erythema or drainage.  Mild suprapubic tenderness on exam.). There is no right CVA tenderness or left CVA tenderness.  Musculoskeletal:        General: Normal range of motion.  Skin:    General: Skin is warm.     Capillary Refill: Capillary refill takes less than 2 seconds.     Findings: No erythema or rash.  Neurological:     General: No focal deficit present.     Mental Status: He is alert.     Sensory: No sensory deficit.     Motor: No weakness.     ED Results / Procedures / Treatments   Labs (all labs ordered are listed, but only abnormal results are displayed) Labs Reviewed  URINALYSIS, ROUTINE W REFLEX MICROSCOPIC - Abnormal; Notable for the following components:      Result Value   Color, Urine RED (*)    APPearance TURBID (*)    Glucose, UA   (*)    Value: TEST NOT REPORTED DUE TO COLOR INTERFERENCE OF URINE PIGMENT   Hgb urine dipstick   (*)    Value: TEST NOT REPORTED DUE TO COLOR INTERFERENCE OF URINE PIGMENT   Bilirubin Urine   (*)    Value: TEST NOT REPORTED DUE TO COLOR INTERFERENCE OF URINE PIGMENT   Ketones, ur   (*)    Value: TEST NOT REPORTED DUE TO COLOR INTERFERENCE OF URINE PIGMENT   Protein, ur   (*)    Value: TEST NOT REPORTED DUE TO COLOR INTERFERENCE OF URINE PIGMENT   Nitrite   (*)    Value: TEST NOT REPORTED DUE TO COLOR INTERFERENCE OF URINE PIGMENT   Leukocytes,Ua   (*)    Value: TEST NOT REPORTED DUE TO COLOR INTERFERENCE OF URINE PIGMENT   All other components within normal limits  URINALYSIS, MICROSCOPIC (REFLEX) - Abnormal; Notable for the following components:   Bacteria, UA MANY (*)    All other components within normal limits  URINE CULTURE  EKG None  Radiology No results found.  Procedures Procedures (including critical care time)  Medications Ordered in ED Medications - No data to display  ED Course  I have reviewed the triage vital signs and the nursing notes.  Pertinent labs & imaging results that were available during my care of the patient were reviewed by me and considered in my medical decision making (see chart for details).    MDM Rules/Calculators/A&P                      Patient here from nursing facility with complaint of hematuria and urinary retention.  Symptoms beginning last evening.  Appears very uncomfortable on arrival, Bladder scan shows 632 cc.  A Foley catheter was then inserted by nursing staff with immediate return of urine with a significant amount of blood and passage of multiple blood clots.  Patient reports immediate improvement of his pain.  On recheck, patient has well draining Foley catheter in place.  Greater than 1000 cc of bloody urine returned.  Urinalysis's and urine culture sent.  Urinalysis shows greater than 50 red cells with many bacteria, unable to evaluate for leukocytes and nitrites due to interference with the urine pigment.  Urine culture is pending.  Patient is well-appearing and nontoxic.  Vitals reviewed. He is symptom-free at present.  I feel he is appropriate for discharge back to the facility.  We will leave the Foley catheter in place and he agrees to follow-up with urology.  We will empirically treat with antibiotics.  Discussed care plan with patient    Final Clinical Impression(s) / ED Diagnoses Final diagnoses:  Urinary retention    Rx / DC Orders ED Discharge Orders         Ordered    cephALEXin (KEFLEX) 500 MG capsule  4 times daily     07/15/19 1413           Kem Parkinson, PA-C 07/15/19 1529    Noemi Chapel, MD 07/19/19 2022

## 2019-07-15 NOTE — ED Provider Notes (Signed)
Patient presents from nursing facility with a complaint of hematuria, he is not able to pass any urine at this time, on arrival patient was found to have significant urinary retention abdominal fullness.  This was completely relieved with passing of a Foley catheter.  Hematuria present clinically visually and grossly.  Urinalysis reveals some bacteria lots of blood.  Will cover for infection, culture the urine, otherwise he is well-appearing and can be discharged home to follow-up with urology with Foley catheter in place.  Medical screening examination/treatment/procedure(s) were conducted as a shared visit with non-physician practitioner(s) and myself.  I personally evaluated the patient during the encounter.  Clinical Impression:   Final diagnoses:  Urinary retention         Noemi Chapel, MD 07/19/19 2021

## 2019-07-16 DIAGNOSIS — K439 Ventral hernia without obstruction or gangrene: Secondary | ICD-10-CM | POA: Diagnosis not present

## 2019-07-16 DIAGNOSIS — I5022 Chronic systolic (congestive) heart failure: Secondary | ICD-10-CM | POA: Diagnosis not present

## 2019-07-16 DIAGNOSIS — I4891 Unspecified atrial fibrillation: Secondary | ICD-10-CM | POA: Diagnosis not present

## 2019-07-17 LAB — URINE CULTURE: Culture: NO GROWTH

## 2019-07-18 DIAGNOSIS — I110 Hypertensive heart disease with heart failure: Secondary | ICD-10-CM | POA: Diagnosis not present

## 2019-07-18 DIAGNOSIS — D500 Iron deficiency anemia secondary to blood loss (chronic): Secondary | ICD-10-CM | POA: Diagnosis not present

## 2019-07-18 DIAGNOSIS — T8383XA Hemorrhage of genitourinary prosthetic devices, implants and grafts, initial encounter: Secondary | ICD-10-CM | POA: Diagnosis not present

## 2019-07-18 DIAGNOSIS — Z7901 Long term (current) use of anticoagulants: Secondary | ICD-10-CM | POA: Diagnosis not present

## 2019-07-18 DIAGNOSIS — R338 Other retention of urine: Secondary | ICD-10-CM | POA: Diagnosis not present

## 2019-07-18 DIAGNOSIS — R58 Hemorrhage, not elsewhere classified: Secondary | ICD-10-CM | POA: Diagnosis not present

## 2019-07-18 DIAGNOSIS — R279 Unspecified lack of coordination: Secondary | ICD-10-CM | POA: Diagnosis not present

## 2019-07-18 DIAGNOSIS — F411 Generalized anxiety disorder: Secondary | ICD-10-CM | POA: Diagnosis not present

## 2019-07-18 DIAGNOSIS — I5030 Unspecified diastolic (congestive) heart failure: Secondary | ICD-10-CM | POA: Diagnosis not present

## 2019-07-18 DIAGNOSIS — R531 Weakness: Secondary | ICD-10-CM | POA: Diagnosis not present

## 2019-07-18 DIAGNOSIS — I4891 Unspecified atrial fibrillation: Secondary | ICD-10-CM | POA: Diagnosis not present

## 2019-07-18 DIAGNOSIS — Z48815 Encounter for surgical aftercare following surgery on the digestive system: Secondary | ICD-10-CM | POA: Diagnosis not present

## 2019-07-18 DIAGNOSIS — R2689 Other abnormalities of gait and mobility: Secondary | ICD-10-CM | POA: Diagnosis not present

## 2019-07-18 DIAGNOSIS — R41841 Cognitive communication deficit: Secondary | ICD-10-CM | POA: Diagnosis not present

## 2019-07-18 DIAGNOSIS — Z466 Encounter for fitting and adjustment of urinary device: Secondary | ICD-10-CM | POA: Diagnosis not present

## 2019-07-18 DIAGNOSIS — N400 Enlarged prostate without lower urinary tract symptoms: Secondary | ICD-10-CM | POA: Diagnosis not present

## 2019-07-18 DIAGNOSIS — R319 Hematuria, unspecified: Secondary | ICD-10-CM | POA: Diagnosis not present

## 2019-07-18 DIAGNOSIS — R000 Tachycardia, unspecified: Secondary | ICD-10-CM | POA: Diagnosis not present

## 2019-07-18 DIAGNOSIS — D075 Carcinoma in situ of prostate: Secondary | ICD-10-CM | POA: Diagnosis not present

## 2019-07-18 DIAGNOSIS — M6281 Muscle weakness (generalized): Secondary | ICD-10-CM | POA: Diagnosis not present

## 2019-07-18 DIAGNOSIS — R310 Gross hematuria: Secondary | ICD-10-CM | POA: Diagnosis not present

## 2019-07-18 DIAGNOSIS — Y846 Urinary catheterization as the cause of abnormal reaction of the patient, or of later complication, without mention of misadventure at the time of the procedure: Secondary | ICD-10-CM | POA: Diagnosis not present

## 2019-07-18 DIAGNOSIS — R011 Cardiac murmur, unspecified: Secondary | ICD-10-CM | POA: Diagnosis not present

## 2019-07-18 DIAGNOSIS — I5032 Chronic diastolic (congestive) heart failure: Secondary | ICD-10-CM | POA: Diagnosis not present

## 2019-07-18 DIAGNOSIS — R718 Other abnormality of red blood cells: Secondary | ICD-10-CM | POA: Diagnosis not present

## 2019-07-18 DIAGNOSIS — R339 Retention of urine, unspecified: Secondary | ICD-10-CM | POA: Diagnosis not present

## 2019-07-18 DIAGNOSIS — N401 Enlarged prostate with lower urinary tract symptoms: Secondary | ICD-10-CM | POA: Diagnosis not present

## 2019-07-18 DIAGNOSIS — Z87891 Personal history of nicotine dependence: Secondary | ICD-10-CM | POA: Diagnosis not present

## 2019-07-18 DIAGNOSIS — R7889 Finding of other specified substances, not normally found in blood: Secondary | ICD-10-CM | POA: Diagnosis not present

## 2019-07-18 DIAGNOSIS — Z5181 Encounter for therapeutic drug level monitoring: Secondary | ICD-10-CM | POA: Diagnosis not present

## 2019-07-18 DIAGNOSIS — R05 Cough: Secondary | ICD-10-CM | POA: Diagnosis not present

## 2019-07-18 DIAGNOSIS — Z960 Presence of urogenital implants: Secondary | ICD-10-CM | POA: Diagnosis not present

## 2019-07-18 DIAGNOSIS — Z20822 Contact with and (suspected) exposure to covid-19: Secondary | ICD-10-CM | POA: Diagnosis not present

## 2019-07-18 DIAGNOSIS — D62 Acute posthemorrhagic anemia: Secondary | ICD-10-CM | POA: Diagnosis not present

## 2019-07-18 DIAGNOSIS — Z743 Need for continuous supervision: Secondary | ICD-10-CM | POA: Diagnosis not present

## 2019-07-18 DIAGNOSIS — I447 Left bundle-branch block, unspecified: Secondary | ICD-10-CM | POA: Diagnosis not present

## 2019-07-18 DIAGNOSIS — Z952 Presence of prosthetic heart valve: Secondary | ICD-10-CM | POA: Diagnosis not present

## 2019-07-18 DIAGNOSIS — R972 Elevated prostate specific antigen [PSA]: Secondary | ICD-10-CM | POA: Diagnosis not present

## 2019-07-22 ENCOUNTER — Other Ambulatory Visit: Payer: Self-pay

## 2019-07-22 NOTE — Patient Outreach (Signed)
[facility]) Care Management  07/22/2019  Timothy COPPOLINO Sr. [birth year] [id number]   Referral Date: 07/22/19 Referral Source:  [employer] Report Date of Discharge: 07/18/19 Facility:  [place]: [facility]    Referral received.  No outreach warranted at this time.  Transition of Care calls being completed via EMMI. RN CM will outreach patient for any red flags received.    Plan: RN CM will close case.    Jone Baseman, RN, MSN [facility] Care Management Care Management Coordinator Direct Line [phone] Toll Free: [phone]  Fax: [phone]

## 2019-07-25 DIAGNOSIS — Z48815 Encounter for surgical aftercare following surgery on the digestive system: Secondary | ICD-10-CM | POA: Diagnosis not present

## 2019-07-25 DIAGNOSIS — Z960 Presence of urogenital implants: Secondary | ICD-10-CM | POA: Diagnosis not present

## 2019-07-25 DIAGNOSIS — Z7901 Long term (current) use of anticoagulants: Secondary | ICD-10-CM | POA: Diagnosis not present

## 2019-07-25 DIAGNOSIS — R310 Gross hematuria: Secondary | ICD-10-CM | POA: Diagnosis not present

## 2019-07-25 DIAGNOSIS — I5022 Chronic systolic (congestive) heart failure: Secondary | ICD-10-CM | POA: Diagnosis not present

## 2019-07-25 DIAGNOSIS — R338 Other retention of urine: Secondary | ICD-10-CM | POA: Diagnosis not present

## 2019-07-25 DIAGNOSIS — Z952 Presence of prosthetic heart valve: Secondary | ICD-10-CM | POA: Diagnosis not present

## 2019-07-25 DIAGNOSIS — D075 Carcinoma in situ of prostate: Secondary | ICD-10-CM | POA: Diagnosis not present

## 2019-07-25 DIAGNOSIS — Z9889 Other specified postprocedural states: Secondary | ICD-10-CM | POA: Diagnosis not present

## 2019-07-25 DIAGNOSIS — F411 Generalized anxiety disorder: Secondary | ICD-10-CM | POA: Diagnosis not present

## 2019-07-25 DIAGNOSIS — R339 Retention of urine, unspecified: Secondary | ICD-10-CM | POA: Diagnosis not present

## 2019-07-25 DIAGNOSIS — M6281 Muscle weakness (generalized): Secondary | ICD-10-CM | POA: Diagnosis not present

## 2019-07-25 DIAGNOSIS — Z8719 Personal history of other diseases of the digestive system: Secondary | ICD-10-CM | POA: Diagnosis not present

## 2019-07-25 DIAGNOSIS — R41841 Cognitive communication deficit: Secondary | ICD-10-CM | POA: Diagnosis not present

## 2019-07-25 DIAGNOSIS — R531 Weakness: Secondary | ICD-10-CM | POA: Diagnosis not present

## 2019-07-25 DIAGNOSIS — Z743 Need for continuous supervision: Secondary | ICD-10-CM | POA: Diagnosis not present

## 2019-07-25 DIAGNOSIS — N401 Enlarged prostate with lower urinary tract symptoms: Secondary | ICD-10-CM | POA: Diagnosis not present

## 2019-07-25 DIAGNOSIS — I5032 Chronic diastolic (congestive) heart failure: Secondary | ICD-10-CM | POA: Diagnosis not present

## 2019-07-25 DIAGNOSIS — R2689 Other abnormalities of gait and mobility: Secondary | ICD-10-CM | POA: Diagnosis not present

## 2019-07-25 DIAGNOSIS — Z09 Encounter for follow-up examination after completed treatment for conditions other than malignant neoplasm: Secondary | ICD-10-CM | POA: Diagnosis not present

## 2019-07-25 DIAGNOSIS — N400 Enlarged prostate without lower urinary tract symptoms: Secondary | ICD-10-CM | POA: Diagnosis not present

## 2019-07-25 DIAGNOSIS — I482 Chronic atrial fibrillation, unspecified: Secondary | ICD-10-CM | POA: Diagnosis not present

## 2019-07-25 DIAGNOSIS — I110 Hypertensive heart disease with heart failure: Secondary | ICD-10-CM | POA: Diagnosis not present

## 2019-07-25 DIAGNOSIS — I4891 Unspecified atrial fibrillation: Secondary | ICD-10-CM | POA: Diagnosis not present

## 2019-07-25 DIAGNOSIS — K219 Gastro-esophageal reflux disease without esophagitis: Secondary | ICD-10-CM | POA: Diagnosis not present

## 2019-07-25 DIAGNOSIS — J471 Bronchiectasis with (acute) exacerbation: Secondary | ICD-10-CM | POA: Diagnosis not present

## 2019-07-25 DIAGNOSIS — I429 Cardiomyopathy, unspecified: Secondary | ICD-10-CM | POA: Diagnosis not present

## 2019-07-25 DIAGNOSIS — Z5181 Encounter for therapeutic drug level monitoring: Secondary | ICD-10-CM | POA: Diagnosis not present

## 2019-07-25 DIAGNOSIS — D62 Acute posthemorrhagic anemia: Secondary | ICD-10-CM | POA: Diagnosis not present

## 2019-07-25 DIAGNOSIS — R279 Unspecified lack of coordination: Secondary | ICD-10-CM | POA: Diagnosis not present

## 2019-07-26 NOTE — Progress Notes (Unsigned)
No ICM remote transmission received for 07/10/2019 and next ICM transmission scheduled for 08/07/2019.

## 2019-07-29 ENCOUNTER — Other Ambulatory Visit: Payer: Self-pay

## 2019-07-29 DIAGNOSIS — Z9889 Other specified postprocedural states: Secondary | ICD-10-CM | POA: Diagnosis not present

## 2019-07-29 DIAGNOSIS — I4891 Unspecified atrial fibrillation: Secondary | ICD-10-CM | POA: Diagnosis not present

## 2019-07-29 DIAGNOSIS — I5022 Chronic systolic (congestive) heart failure: Secondary | ICD-10-CM | POA: Diagnosis not present

## 2019-07-29 DIAGNOSIS — Z8719 Personal history of other diseases of the digestive system: Secondary | ICD-10-CM | POA: Diagnosis not present

## 2019-07-29 NOTE — Patient Outreach (Signed)
[place] [facility]) Care Management  07/29/2019  Timothy Fritz. March 20, 1940 [id number]     Transition of Care Referral  Referral Date: 07/29/2019 Referral Source: [facility] Discharge Report Date of Discharge: 07/25/2019 Facility: [place]: [facility]    Referral received. No outreach warranted at this time. TOC will be completed by primary care provider office who will refer to [facility] care mgmt if needed.     Plan: RN CM will close case at this time.    Enzo Montgomery, RN,BSN,CCM [facility] Management Telephonic Care Management Coordinator Direct Phone: [phone] Toll Free: [phone] Fax: [phone]

## 2019-07-30 DIAGNOSIS — R310 Gross hematuria: Secondary | ICD-10-CM | POA: Diagnosis not present

## 2019-07-30 DIAGNOSIS — Z09 Encounter for follow-up examination after completed treatment for conditions other than malignant neoplasm: Secondary | ICD-10-CM | POA: Diagnosis not present

## 2019-07-30 DIAGNOSIS — D075 Carcinoma in situ of prostate: Secondary | ICD-10-CM | POA: Diagnosis not present

## 2019-07-30 DIAGNOSIS — N401 Enlarged prostate with lower urinary tract symptoms: Secondary | ICD-10-CM | POA: Diagnosis not present

## 2019-07-30 DIAGNOSIS — R339 Retention of urine, unspecified: Secondary | ICD-10-CM | POA: Diagnosis not present

## 2019-07-30 DIAGNOSIS — R338 Other retention of urine: Secondary | ICD-10-CM | POA: Diagnosis not present

## 2019-08-07 ENCOUNTER — Ambulatory Visit (INDEPENDENT_AMBULATORY_CARE_PROVIDER_SITE_OTHER): Payer: Medicare HMO

## 2019-08-07 DIAGNOSIS — I5022 Chronic systolic (congestive) heart failure: Secondary | ICD-10-CM

## 2019-08-07 DIAGNOSIS — Z95810 Presence of automatic (implantable) cardiac defibrillator: Secondary | ICD-10-CM | POA: Diagnosis not present

## 2019-08-09 NOTE — Progress Notes (Addendum)
EPIC Encounter for ICM Monitoring  Patient Name: Timothy Fritz. is a 79 y.o. male Date: 08/09/2019 Primary Care Physican: [name], MD Primary Cardiologist:[place] Electrophysiologist: Allred Bi-V Pacing: >99% 5/3/2021Weight:207lbs  AT/AF Burden>99%  Battery Longevity: ~4.7months    Spoke with patient and he has had some complications after hernia surgery.  He is improving and healing but remains weak.  He has a little swelling of the feet.   Corvue thoracic impedance trending slightly below baseline normal.  Prescribed:Furosemide 20 mg 1 tablet dailyas needed.  Labs: 03/02/2018 Creatinine 1.08, BUN 19 Care Everywhere A complete set of results can be found in Results Review.  Recommendations:He will take 1-2 days of PRN Furosemide for swelling of feet.  Follow-up plan: ICM clinic phone appointment on 09/16/2019.91 day device clinic remote transmission8/25/2021.   Copy of ICM check sent to Dr.Allred.   3 month ICM trend: 08/07/2019    1 Year ICM trend:       Timothy Billings, RN 08/09/2019 3:55 PM

## 2019-08-12 DIAGNOSIS — R5383 Other fatigue: Secondary | ICD-10-CM | POA: Diagnosis not present

## 2019-08-12 DIAGNOSIS — I5022 Chronic systolic (congestive) heart failure: Secondary | ICD-10-CM | POA: Diagnosis not present

## 2019-08-12 DIAGNOSIS — I4891 Unspecified atrial fibrillation: Secondary | ICD-10-CM | POA: Diagnosis not present

## 2019-08-12 DIAGNOSIS — G309 Alzheimer's disease, unspecified: Secondary | ICD-10-CM | POA: Diagnosis not present

## 2019-08-12 DIAGNOSIS — Z299 Encounter for prophylactic measures, unspecified: Secondary | ICD-10-CM | POA: Diagnosis not present

## 2019-08-12 DIAGNOSIS — I10 Essential (primary) hypertension: Secondary | ICD-10-CM | POA: Diagnosis not present

## 2019-08-13 DIAGNOSIS — R5383 Other fatigue: Secondary | ICD-10-CM | POA: Diagnosis not present

## 2019-08-15 ENCOUNTER — Ambulatory Visit (INDEPENDENT_AMBULATORY_CARE_PROVIDER_SITE_OTHER): Payer: Medicare HMO | Admitting: *Deleted

## 2019-08-15 DIAGNOSIS — I428 Other cardiomyopathies: Secondary | ICD-10-CM

## 2019-08-15 LAB — CUP PACEART REMOTE DEVICE CHECK
Battery Remaining Longevity: 5 mo
Battery Remaining Percentage: 6 %
Battery Voltage: 2.63 V
Brady Statistic AP VP Percent: 65 %
Brady Statistic AP VS Percent: 4 %
Brady Statistic AS VP Percent: 21 %
Brady Statistic AS VS Percent: 8.4 %
Brady Statistic RA Percent Paced: 13 %
Date Time Interrogation Session: 20210701020017
HighPow Impedance: 62 Ohm
HighPow Impedance: 62 Ohm
Implantable Lead Implant Date: 20140529
Implantable Lead Implant Date: 20140529
Implantable Lead Implant Date: 20140529
Implantable Lead Location: 753858
Implantable Lead Location: 753859
Implantable Lead Location: 753860
Implantable Pulse Generator Implant Date: 20140529
Lead Channel Impedance Value: 290 Ohm
Lead Channel Impedance Value: 340 Ohm
Lead Channel Impedance Value: 550 Ohm
Lead Channel Pacing Threshold Amplitude: 0.75 V
Lead Channel Pacing Threshold Amplitude: 1 V
Lead Channel Pacing Threshold Amplitude: 1.25 V
Lead Channel Pacing Threshold Pulse Width: 0.5 ms
Lead Channel Pacing Threshold Pulse Width: 0.5 ms
Lead Channel Pacing Threshold Pulse Width: 0.5 ms
Lead Channel Sensing Intrinsic Amplitude: 1 mV
Lead Channel Sensing Intrinsic Amplitude: 11.7 mV
Lead Channel Setting Pacing Amplitude: 2 V
Lead Channel Setting Pacing Amplitude: 2 V
Lead Channel Setting Pacing Amplitude: 2.25 V
Lead Channel Setting Pacing Pulse Width: 0.5 ms
Lead Channel Setting Pacing Pulse Width: 0.5 ms
Lead Channel Setting Sensing Sensitivity: 0.5 mV
Pulse Gen Serial Number: 7097390

## 2019-08-16 ENCOUNTER — Encounter: Payer: Medicare HMO | Admitting: Internal Medicine

## 2019-08-16 NOTE — Progress Notes (Signed)
Remote ICD transmission.   

## 2019-08-17 ENCOUNTER — Other Ambulatory Visit: Payer: Self-pay | Admitting: Cardiology

## 2019-08-23 ENCOUNTER — Encounter: Payer: Medicare HMO | Admitting: Internal Medicine

## 2019-08-23 DIAGNOSIS — I4891 Unspecified atrial fibrillation: Secondary | ICD-10-CM | POA: Diagnosis not present

## 2019-08-23 DIAGNOSIS — I10 Essential (primary) hypertension: Secondary | ICD-10-CM | POA: Diagnosis not present

## 2019-08-23 DIAGNOSIS — I5022 Chronic systolic (congestive) heart failure: Secondary | ICD-10-CM | POA: Diagnosis not present

## 2019-08-23 DIAGNOSIS — Z87891 Personal history of nicotine dependence: Secondary | ICD-10-CM | POA: Diagnosis not present

## 2019-08-23 DIAGNOSIS — Z299 Encounter for prophylactic measures, unspecified: Secondary | ICD-10-CM | POA: Diagnosis not present

## 2019-09-03 DIAGNOSIS — K5720 Diverticulitis of large intestine with perforation and abscess without bleeding: Secondary | ICD-10-CM | POA: Diagnosis not present

## 2019-09-03 DIAGNOSIS — K219 Gastro-esophageal reflux disease without esophagitis: Secondary | ICD-10-CM | POA: Diagnosis not present

## 2019-09-03 DIAGNOSIS — R0989 Other specified symptoms and signs involving the circulatory and respiratory systems: Secondary | ICD-10-CM | POA: Diagnosis not present

## 2019-09-03 DIAGNOSIS — J471 Bronchiectasis with (acute) exacerbation: Secondary | ICD-10-CM | POA: Diagnosis not present

## 2019-09-03 DIAGNOSIS — N132 Hydronephrosis with renal and ureteral calculous obstruction: Secondary | ICD-10-CM | POA: Diagnosis not present

## 2019-09-03 DIAGNOSIS — N201 Calculus of ureter: Secondary | ICD-10-CM | POA: Diagnosis not present

## 2019-09-03 DIAGNOSIS — Z20822 Contact with and (suspected) exposure to covid-19: Secondary | ICD-10-CM | POA: Diagnosis not present

## 2019-09-03 DIAGNOSIS — J90 Pleural effusion, not elsewhere classified: Secondary | ICD-10-CM | POA: Diagnosis not present

## 2019-09-03 DIAGNOSIS — R1032 Left lower quadrant pain: Secondary | ICD-10-CM | POA: Diagnosis not present

## 2019-09-03 DIAGNOSIS — N3000 Acute cystitis without hematuria: Secondary | ICD-10-CM | POA: Diagnosis not present

## 2019-09-03 DIAGNOSIS — K8689 Other specified diseases of pancreas: Secondary | ICD-10-CM | POA: Diagnosis not present

## 2019-09-03 DIAGNOSIS — I119 Hypertensive heart disease without heart failure: Secondary | ICD-10-CM | POA: Diagnosis not present

## 2019-09-03 DIAGNOSIS — Z87891 Personal history of nicotine dependence: Secondary | ICD-10-CM | POA: Diagnosis not present

## 2019-09-04 DIAGNOSIS — R0989 Other specified symptoms and signs involving the circulatory and respiratory systems: Secondary | ICD-10-CM | POA: Diagnosis not present

## 2019-09-05 DIAGNOSIS — Z952 Presence of prosthetic heart valve: Secondary | ICD-10-CM | POA: Diagnosis not present

## 2019-09-05 DIAGNOSIS — Z87891 Personal history of nicotine dependence: Secondary | ICD-10-CM | POA: Diagnosis not present

## 2019-09-05 DIAGNOSIS — K5792 Diverticulitis of intestine, part unspecified, without perforation or abscess without bleeding: Secondary | ICD-10-CM | POA: Diagnosis not present

## 2019-09-05 DIAGNOSIS — N3001 Acute cystitis with hematuria: Secondary | ICD-10-CM | POA: Diagnosis not present

## 2019-09-05 DIAGNOSIS — R188 Other ascites: Secondary | ICD-10-CM | POA: Diagnosis not present

## 2019-09-05 DIAGNOSIS — R0602 Shortness of breath: Secondary | ICD-10-CM | POA: Diagnosis not present

## 2019-09-05 DIAGNOSIS — Z95810 Presence of automatic (implantable) cardiac defibrillator: Secondary | ICD-10-CM | POA: Diagnosis not present

## 2019-09-05 DIAGNOSIS — K5732 Diverticulitis of large intestine without perforation or abscess without bleeding: Secondary | ICD-10-CM | POA: Diagnosis not present

## 2019-09-05 DIAGNOSIS — R1032 Left lower quadrant pain: Secondary | ICD-10-CM | POA: Diagnosis not present

## 2019-09-05 DIAGNOSIS — E785 Hyperlipidemia, unspecified: Secondary | ICD-10-CM | POA: Diagnosis not present

## 2019-09-05 DIAGNOSIS — J90 Pleural effusion, not elsewhere classified: Secondary | ICD-10-CM | POA: Diagnosis not present

## 2019-09-05 DIAGNOSIS — I10 Essential (primary) hypertension: Secondary | ICD-10-CM | POA: Diagnosis not present

## 2019-09-05 DIAGNOSIS — D649 Anemia, unspecified: Secondary | ICD-10-CM | POA: Diagnosis not present

## 2019-09-05 DIAGNOSIS — I2510 Atherosclerotic heart disease of native coronary artery without angina pectoris: Secondary | ICD-10-CM | POA: Diagnosis not present

## 2019-09-05 DIAGNOSIS — Z79899 Other long term (current) drug therapy: Secondary | ICD-10-CM | POA: Diagnosis not present

## 2019-09-05 DIAGNOSIS — R0989 Other specified symptoms and signs involving the circulatory and respiratory systems: Secondary | ICD-10-CM | POA: Diagnosis not present

## 2019-09-05 DIAGNOSIS — N3000 Acute cystitis without hematuria: Secondary | ICD-10-CM | POA: Diagnosis not present

## 2019-09-05 DIAGNOSIS — K651 Peritoneal abscess: Secondary | ICD-10-CM | POA: Diagnosis not present

## 2019-09-05 DIAGNOSIS — I482 Chronic atrial fibrillation, unspecified: Secondary | ICD-10-CM | POA: Diagnosis not present

## 2019-09-05 DIAGNOSIS — Z7901 Long term (current) use of anticoagulants: Secondary | ICD-10-CM | POA: Diagnosis not present

## 2019-09-05 DIAGNOSIS — K5720 Diverticulitis of large intestine with perforation and abscess without bleeding: Secondary | ICD-10-CM | POA: Diagnosis not present

## 2019-09-05 DIAGNOSIS — I447 Left bundle-branch block, unspecified: Secondary | ICD-10-CM | POA: Diagnosis not present

## 2019-09-05 DIAGNOSIS — N200 Calculus of kidney: Secondary | ICD-10-CM | POA: Diagnosis not present

## 2019-09-05 DIAGNOSIS — J811 Chronic pulmonary edema: Secondary | ICD-10-CM | POA: Diagnosis not present

## 2019-09-05 DIAGNOSIS — I517 Cardiomegaly: Secondary | ICD-10-CM | POA: Diagnosis not present

## 2019-09-05 DIAGNOSIS — Z20822 Contact with and (suspected) exposure to covid-19: Secondary | ICD-10-CM | POA: Diagnosis not present

## 2019-09-05 DIAGNOSIS — I119 Hypertensive heart disease without heart failure: Secondary | ICD-10-CM | POA: Diagnosis not present

## 2019-09-05 DIAGNOSIS — Z951 Presence of aortocoronary bypass graft: Secondary | ICD-10-CM | POA: Diagnosis not present

## 2019-09-05 DIAGNOSIS — N132 Hydronephrosis with renal and ureteral calculous obstruction: Secondary | ICD-10-CM | POA: Diagnosis not present

## 2019-09-06 DIAGNOSIS — Z79899 Other long term (current) drug therapy: Secondary | ICD-10-CM | POA: Diagnosis not present

## 2019-09-06 DIAGNOSIS — R188 Other ascites: Secondary | ICD-10-CM | POA: Diagnosis not present

## 2019-09-06 DIAGNOSIS — N200 Calculus of kidney: Secondary | ICD-10-CM | POA: Diagnosis not present

## 2019-09-06 DIAGNOSIS — Z95810 Presence of automatic (implantable) cardiac defibrillator: Secondary | ICD-10-CM | POA: Diagnosis not present

## 2019-09-06 DIAGNOSIS — Z952 Presence of prosthetic heart valve: Secondary | ICD-10-CM | POA: Diagnosis not present

## 2019-09-06 DIAGNOSIS — I2510 Atherosclerotic heart disease of native coronary artery without angina pectoris: Secondary | ICD-10-CM | POA: Diagnosis not present

## 2019-09-06 DIAGNOSIS — R1032 Left lower quadrant pain: Secondary | ICD-10-CM | POA: Diagnosis not present

## 2019-09-06 DIAGNOSIS — K5732 Diverticulitis of large intestine without perforation or abscess without bleeding: Secondary | ICD-10-CM | POA: Diagnosis not present

## 2019-09-06 DIAGNOSIS — Z87891 Personal history of nicotine dependence: Secondary | ICD-10-CM | POA: Diagnosis not present

## 2019-09-06 DIAGNOSIS — I447 Left bundle-branch block, unspecified: Secondary | ICD-10-CM | POA: Diagnosis not present

## 2019-09-06 DIAGNOSIS — E785 Hyperlipidemia, unspecified: Secondary | ICD-10-CM | POA: Diagnosis not present

## 2019-09-06 DIAGNOSIS — N3001 Acute cystitis with hematuria: Secondary | ICD-10-CM | POA: Diagnosis not present

## 2019-09-06 DIAGNOSIS — Z7901 Long term (current) use of anticoagulants: Secondary | ICD-10-CM | POA: Diagnosis not present

## 2019-09-06 DIAGNOSIS — I10 Essential (primary) hypertension: Secondary | ICD-10-CM | POA: Diagnosis not present

## 2019-09-06 DIAGNOSIS — I482 Chronic atrial fibrillation, unspecified: Secondary | ICD-10-CM | POA: Diagnosis not present

## 2019-09-06 DIAGNOSIS — D649 Anemia, unspecified: Secondary | ICD-10-CM | POA: Diagnosis not present

## 2019-09-13 DIAGNOSIS — I4891 Unspecified atrial fibrillation: Secondary | ICD-10-CM | POA: Diagnosis not present

## 2019-09-13 DIAGNOSIS — I509 Heart failure, unspecified: Secondary | ICD-10-CM | POA: Diagnosis not present

## 2019-09-13 DIAGNOSIS — I10 Essential (primary) hypertension: Secondary | ICD-10-CM | POA: Diagnosis not present

## 2019-09-13 DIAGNOSIS — Z299 Encounter for prophylactic measures, unspecified: Secondary | ICD-10-CM | POA: Diagnosis not present

## 2019-09-20 ENCOUNTER — Encounter: Payer: Self-pay | Admitting: Internal Medicine

## 2019-09-20 ENCOUNTER — Ambulatory Visit: Payer: Medicare Other | Admitting: Internal Medicine

## 2019-09-20 VITALS — BP 144/82 | HR 80 | Ht 67.0 in | Wt 193.0 lb

## 2019-09-20 DIAGNOSIS — Z95810 Presence of automatic (implantable) cardiac defibrillator: Secondary | ICD-10-CM

## 2019-09-20 DIAGNOSIS — I480 Paroxysmal atrial fibrillation: Secondary | ICD-10-CM | POA: Diagnosis not present

## 2019-09-20 DIAGNOSIS — I428 Other cardiomyopathies: Secondary | ICD-10-CM | POA: Diagnosis not present

## 2019-09-20 DIAGNOSIS — I5022 Chronic systolic (congestive) heart failure: Secondary | ICD-10-CM

## 2019-09-20 LAB — CUP PACEART INCLINIC DEVICE CHECK
Battery Remaining Longevity: 3 mo
Brady Statistic RA Percent Paced: 13 %
Brady Statistic RV Percent Paced: 77 %
Date Time Interrogation Session: 20210806130254
HighPow Impedance: 51.75 Ohm
HighPow Impedance: 51.75 Ohm
Implantable Lead Implant Date: 20140529
Implantable Lead Implant Date: 20140529
Implantable Lead Implant Date: 20140529
Implantable Lead Location: 753858
Implantable Lead Location: 753859
Implantable Lead Location: 753860
Implantable Pulse Generator Implant Date: 20140529
Lead Channel Impedance Value: 300 Ohm
Lead Channel Impedance Value: 300 Ohm
Lead Channel Impedance Value: 350 Ohm
Lead Channel Impedance Value: 350 Ohm
Lead Channel Impedance Value: 550 Ohm
Lead Channel Impedance Value: 550 Ohm
Lead Channel Pacing Threshold Amplitude: 1 V
Lead Channel Pacing Threshold Amplitude: 1 V
Lead Channel Pacing Threshold Amplitude: 1 V
Lead Channel Pacing Threshold Amplitude: 1 V
Lead Channel Pacing Threshold Amplitude: 1.125 V
Lead Channel Pacing Threshold Pulse Width: 0.5 ms
Lead Channel Pacing Threshold Pulse Width: 0.5 ms
Lead Channel Pacing Threshold Pulse Width: 0.5 ms
Lead Channel Pacing Threshold Pulse Width: 0.5 ms
Lead Channel Pacing Threshold Pulse Width: 0.5 ms
Lead Channel Sensing Intrinsic Amplitude: 1.8 mV
Lead Channel Sensing Intrinsic Amplitude: 11.7 mV
Lead Channel Setting Pacing Amplitude: 2 V
Lead Channel Setting Pacing Amplitude: 2.125
Lead Channel Setting Pacing Pulse Width: 0.5 ms
Lead Channel Setting Pacing Pulse Width: 0.5 ms
Lead Channel Setting Sensing Sensitivity: 0.5 mV
Pulse Gen Serial Number: 7097390

## 2019-09-20 NOTE — Patient Instructions (Signed)
Medication Instructions:  Continue all current medications.  Labwork: none  Testing/Procedures: none  Follow-Up: Continue remotes as planned   Any Other Special Instructions Will Be Listed Below (If Applicable).  If you need a refill on your cardiac medications before your next appointment, please call your pharmacy.

## 2019-09-20 NOTE — Progress Notes (Signed)
PCP: [name], MD Primary Cardiologist: Dr Domenic Polite Primary EP: Dr Rayann Heman  Timothy Fowler Sr. is a 79 y.o. male who presents today for routine electrophysiology followup.  Since last being seen in our clinic, the patient reports doing reasonably well.  He has fatigue, edema, and SOB.  Today, he denies symptoms of palpitations, chest pain,  dizziness, presyncope, syncope, or ICD shocks.  The patient is otherwise without complaint today.   Past Medical History:  Diagnosis Date  . AICD (automatic cardioverter/defibrillator) present 07/11/2012  . Aortic aneurysm, thoracic ([place])    Fusiform s/p Bentall procedure 2002  . Aortic valve, bicuspid    Mechanical AVR  . Basal cell carcinoma of face   . BPH (benign prostatic hypertrophy)   . Coronary atherosclerosis of native coronary artery    Nonobstructive at cardiac catherization 2002  . Essential hypertension   . Left bundle branch block   . Mixed hyperlipidemia   . Nonischemic cardiomyopathy (HCC)    CRT-D  . Persistent atrial fibrillation [facility])    Past Surgical History:  Procedure Laterality Date  . BENTALL PROCEDURE  2002   Dr Cyndia Bent   . BI-VENTRICULAR IMPLANTABLE CARDIOVERTER DEFIBRILLATOR N/A 07/12/2012   Procedure: BI-VENTRICULAR IMPLANTABLE CARDIOVERTER DEFIBRILLATOR  (CRT-D);  Surgeon: [name], MD;  Location: [facility] CATH LAB;  Service: Cardiovascular;  Laterality: N/A;  . BI-VENTRICULAR IMPLANTABLE CARDIOVERTER DEFIBRILLATOR  (CRT-D)  07/11/2012   [facility]- Dr. Rayann Heman  . CARDIAC CATHETERIZATION  2002  . CARDIAC VALVE REPLACEMENT     AVR  . CATARACT EXTRACTION Left 01/21/13  . COLONOSCOPY N/A 12/01/2016   Procedure: COLONOSCOPY;  Surgeon: Rogene Houston, MD;  [facility];  Service: Endoscopy;  Laterality: N/A;  2:00  . COLONOSCOPY WITH PROPOFOL N/A 11/08/2017   Procedure: COLONOSCOPY WITH PROPOFOL;  Surgeon: Ronnette Juniper, MD;  Location: [place];  Service: Gastroenterology;   Laterality: N/A;  . ESOPHAGOGASTRODUODENOSCOPY (EGD) WITH PROPOFOL N/A 11/08/2017   Procedure: ESOPHAGOGASTRODUODENOSCOPY (EGD) WITH PROPOFOL Needs INR<2;  Surgeon: Ronnette Juniper, MD;  Location: [place];  Service: Gastroenterology;  Laterality: N/A;  . HERNIA REPAIR    . IR ANGIOGRAM SELECTIVE EACH ADDITIONAL VESSEL  11/10/2017  . IR ANGIOGRAM VISCERAL SELECTIVE  11/10/2017  . IR ANGIOGRAM VISCERAL SELECTIVE  11/10/2017  . IR ANGIOGRAM VISCERAL SELECTIVE  11/10/2017  . IR EMBO ART  VEN HEMORR LYMPH EXTRAV  INC GUIDE ROADMAPPING  11/10/2017  . IR US GUIDE VASC ACCESS RIGHT  11/10/2017  . POLYPECTOMY  11/08/2017   Procedure: POLYPECTOMY;  Surgeon: Ronnette Juniper, MD;  Location: [facility] ENDOSCOPY;  Service: Gastroenterology;;  . TONSILLECTOMY AND ADENOIDECTOMY  1949  . UMBILICAL HERNIA REPAIR  09/2017    ROS- all systems are reviewed and negative except as per HPI above  Current Outpatient Medications  Medication Sig Dispense Refill  . acetaminophen (TYLENOL) 500 MG tablet Take 1,000 mg by mouth every 6 (six) hours as needed for headache (pain).    Marland Kitchen atorvastatin (LIPITOR) 10 MG tablet Take 10 mg by mouth daily at 6 PM.     . cholecalciferol (VITAMIN D) 1000 units tablet Take 1,000 Units by mouth at bedtime.     . furosemide (LASIX) 20 MG tablet Take 20 mg by mouth daily as needed for fluid or edema.     Marland Kitchen losartan (COZAAR) 25 MG tablet TAKE 1/2 TABLET BY MOUTH AT BEDTIME (Cut tabs in half) 45 tablet 4  . meclizine (ANTIVERT) 25 MG tablet Take 25 mg by  mouth 3 (three) times daily as needed for dizziness.    . metoprolol succinate (TOPROL-XL) 100 MG 24 hr tablet TAKE 1 AND 1/2 TABLETS BY MOUTH TWICE DAILY 270 tablet 1  . Omega-3 Fatty Acids (FISH OIL) 1000 MG CAPS Take 1,000 mg by mouth daily.     Marland Kitchen oxymetazoline (AFRIN) 0.05 % nasal spray Place 1 spray into both nostrils daily as needed for congestion.    . pantoprazole (PROTONIX) 40 MG tablet Take 40 mg by mouth daily.    . phenylephrine  (NEO-SYNEPHRINE) 1 % nasal spray Place 1 drop into both nostrils daily as needed for congestion.    . Tamsulosin HCl (FLOMAX) 0.4 MG CAPS Take 0.4 mg by mouth at bedtime.     . vitamin C (ASCORBIC ACID) 500 MG tablet Take 500 mg by mouth daily.    Marland Kitchen warfarin (COUMADIN) 5 MG tablet Take 5 mg by mouth at bedtime.      No current facility-administered medications for this visit.    Physical Exam: Vitals:   09/20/19 1109  BP: (!) 144/82  Pulse: 80  Weight: 193 lb (87.5 kg)  Height: 5\' 7"  (1.702 m)    GEN- The patient is well appearing, alert and oriented x 3 today.   Head- normocephalic, atraumatic Eyes-  Sclera clear, conjunctiva pink Ears- hearing intact Oropharynx- clear Lungs-  , normal work of breathing Chest- ICD pocket is well healed Heart- Regular rate and rhythm (paced) GI- soft, NT, ND, + BS Extremities- no clubbing, cyanosis, +edema  ICD interrogation- reviewed in detail today,  See PACEART report   ekg shows afib with Biv Pacing  Echo 04/17/19- EF 50-55%  Wt Readings from Last 3 Encounters:  09/20/19 193 lb (87.5 kg)  07/15/19 212 lb (96.2 kg)  04/19/19 206 lb (93.4 kg)    Assessment and Plan:  1.  Chronic systolic dysfunction/ nonischemic euvolemic today Stable on an appropriate medical regimen Normal BiVICD function Approach ERI Risks, benefits, and alternatives to ICD pulse generator replacement were discussed in detail today.  The patient understands that risks include but are not limited to bleeding, infection, pneumothorax, perforation, tamponade, vascular damage, renal failure, MI, stroke, death, inappropriate shocks, damage to his existing leads, and lead dislodgement and wishes to proceed on he reaches ERI. We will hold coumadin 48 hours prior to the procedure. See [name] Art report No changes today he is not device dependant today followed in ICM device clinic  2. Permanent afib Mostly rate controlled 76% biv paced.  He is considering AV nodal  ablation at time of his generator change.  As his LA size is not that big, we could also consider PVI.  Continue coumadin  3. Mechanical AVR s/p Bentall procedure with AVR On coumadin  4. HTN Stable No change required today   Risks, benefits and potential toxicities for medications prescribed and/or refilled reviewed with patient today.   We will reach out to him once at [facility].  [name] MD, [facility] 09/20/2019 11:25 AM

## 2019-09-24 NOTE — Progress Notes (Signed)
No ICM remote transmission received for 09/16/2019 and next ICM transmission scheduled for 10/10/2019.

## 2019-09-27 DIAGNOSIS — Z299 Encounter for prophylactic measures, unspecified: Secondary | ICD-10-CM | POA: Diagnosis not present

## 2019-09-27 DIAGNOSIS — R5383 Other fatigue: Secondary | ICD-10-CM | POA: Diagnosis not present

## 2019-09-27 DIAGNOSIS — I5022 Chronic systolic (congestive) heart failure: Secondary | ICD-10-CM | POA: Diagnosis not present

## 2019-09-27 DIAGNOSIS — I10 Essential (primary) hypertension: Secondary | ICD-10-CM | POA: Diagnosis not present

## 2019-09-27 DIAGNOSIS — I4891 Unspecified atrial fibrillation: Secondary | ICD-10-CM | POA: Diagnosis not present

## 2019-10-04 DIAGNOSIS — K219 Gastro-esophageal reflux disease without esophagitis: Secondary | ICD-10-CM | POA: Diagnosis not present

## 2019-10-04 DIAGNOSIS — J471 Bronchiectasis with (acute) exacerbation: Secondary | ICD-10-CM | POA: Diagnosis not present

## 2019-10-11 NOTE — Progress Notes (Unsigned)
No ICM remote transmission received for 10/10/2019 and next ICM transmission scheduled for 10/30/2019.

## 2019-10-18 DIAGNOSIS — G309 Alzheimer's disease, unspecified: Secondary | ICD-10-CM | POA: Diagnosis not present

## 2019-10-18 DIAGNOSIS — Z299 Encounter for prophylactic measures, unspecified: Secondary | ICD-10-CM | POA: Diagnosis not present

## 2019-10-18 DIAGNOSIS — I10 Essential (primary) hypertension: Secondary | ICD-10-CM | POA: Diagnosis not present

## 2019-10-18 DIAGNOSIS — I509 Heart failure, unspecified: Secondary | ICD-10-CM | POA: Diagnosis not present

## 2019-10-18 DIAGNOSIS — I4891 Unspecified atrial fibrillation: Secondary | ICD-10-CM | POA: Diagnosis not present

## 2019-10-28 ENCOUNTER — Telehealth: Payer: Self-pay | Admitting: Internal Medicine

## 2019-10-28 NOTE — Telephone Encounter (Signed)
The pt called back. I called Vera with the pt on the phone. They are sending him a wireless adapter. He should received the new wireless adapter in 7-10 business days.

## 2019-10-28 NOTE — Telephone Encounter (Signed)
Patient states his device has been continuously beeping and flashing light since yesterday, 10/27/19. I was able to hear beeping on the phone with patient. He states he unplugged his device last night because it was so noisy. Patient denies pain or symptoms. Please return call to assist.

## 2019-10-28 NOTE — Telephone Encounter (Signed)
I spoke with the pt and he explained to me that he held the big button down then the beeping stopped. I told him that his monitor has not communicated with him since 08/31/2019. I asked him to call [employer] tech support for additional help.

## 2019-10-31 ENCOUNTER — Encounter: Payer: Self-pay | Admitting: Cardiology

## 2019-10-31 ENCOUNTER — Ambulatory Visit: Payer: Medicare Other | Admitting: Cardiology

## 2019-10-31 ENCOUNTER — Other Ambulatory Visit: Payer: Self-pay

## 2019-10-31 VITALS — BP 118/82 | HR 81 | Ht 68.0 in | Wt 193.0 lb

## 2019-10-31 DIAGNOSIS — I4821 Permanent atrial fibrillation: Secondary | ICD-10-CM | POA: Diagnosis not present

## 2019-10-31 DIAGNOSIS — I428 Other cardiomyopathies: Secondary | ICD-10-CM | POA: Diagnosis not present

## 2019-10-31 DIAGNOSIS — Z954 Presence of other heart-valve replacement: Secondary | ICD-10-CM | POA: Diagnosis not present

## 2019-10-31 NOTE — Progress Notes (Signed)
Cardiology Office Note  Date: 10/31/2019   ID: Timothy Fowler Sr., DOB April 05, 1940, MRN [id number]  PCP:  [name], MD  Cardiologist:  Rozann Lesches, MD Electrophysiologist:  [name], MD   Chief Complaint  Patient presents with  . Cardiac catheterization    History of Present Illness: Timothy Kosman. is a 79 y.o. male last assessed via telehealth encounter in March.  He presents for a routine visit.  He tells me that he has been doing reasonably well, no palpitations, device shocks, or syncope.  He does not report any angina symptoms and has NYHA class II dyspnea with typical activities.  He follows with Dr. Rayann Heman, [employer] CRT-D in place.  He is nearing indication for generator change, following closely with EP, I reviewed the interrogation note from August.  He remains on Coumadin with follow-up by Dr. Manuella Ghazi.  He does not report any spontaneous bleeding problems.  I reviewed the remainder of his medications which are outlined below.  His last echocardiogram in March of this year revealed LVEF 50 to 55% range and normally functioning mechanical aortic prosthesis.  Past Medical History:  Diagnosis Date  . AICD (automatic cardioverter/defibrillator) present 07/11/2012  . Aortic aneurysm, thoracic ([place])    Fusiform s/p Bentall procedure 2002  . Aortic valve, bicuspid    Mechanical AVR  . Basal cell carcinoma of face   . BPH (benign prostatic hypertrophy)   . Coronary atherosclerosis of native coronary artery    Nonobstructive at cardiac catherization 2002  . Essential hypertension   . Left bundle branch block   . Mixed hyperlipidemia   . Nonischemic cardiomyopathy (HCC)    CRT-D  . Persistent atrial fibrillation [facility])     Past Surgical History:  Procedure Laterality Date  . BENTALL PROCEDURE  2002   Dr Cyndia Bent   . BI-VENTRICULAR IMPLANTABLE CARDIOVERTER DEFIBRILLATOR N/A 07/12/2012   Procedure: BI-VENTRICULAR IMPLANTABLE CARDIOVERTER DEFIBRILLATOR   (CRT-D);  Surgeon: [name], MD;  Location: [facility] CATH LAB;  Service: Cardiovascular;  Laterality: N/A;  . BI-VENTRICULAR IMPLANTABLE CARDIOVERTER DEFIBRILLATOR  (CRT-D)  07/11/2012   [facility]- Dr. Rayann Heman  . CARDIAC CATHETERIZATION  2002  . CARDIAC VALVE REPLACEMENT     AVR  . CATARACT EXTRACTION Left 01/21/13  . COLONOSCOPY N/A 12/01/2016   Procedure: COLONOSCOPY;  Surgeon: Rogene Houston, MD;  [facility];  Service: Endoscopy;  Laterality: N/A;  2:00  . COLONOSCOPY WITH PROPOFOL N/A 11/08/2017   Procedure: COLONOSCOPY WITH PROPOFOL;  Surgeon: Ronnette Juniper, MD;  Location: [place];  Service: Gastroenterology;  Laterality: N/A;  . ESOPHAGOGASTRODUODENOSCOPY (EGD) WITH PROPOFOL N/A 11/08/2017   Procedure: ESOPHAGOGASTRODUODENOSCOPY (EGD) WITH PROPOFOL Needs INR<2;  Surgeon: Ronnette Juniper, MD;  Location: [place];  Service: Gastroenterology;  Laterality: N/A;  . HERNIA REPAIR    . IR ANGIOGRAM SELECTIVE EACH ADDITIONAL VESSEL  11/10/2017  . IR ANGIOGRAM VISCERAL SELECTIVE  11/10/2017  . IR ANGIOGRAM VISCERAL SELECTIVE  11/10/2017  . IR ANGIOGRAM VISCERAL SELECTIVE  11/10/2017  . IR EMBO ART  VEN HEMORR LYMPH EXTRAV  INC GUIDE ROADMAPPING  11/10/2017  . IR US GUIDE VASC ACCESS RIGHT  11/10/2017  . POLYPECTOMY  11/08/2017   Procedure: POLYPECTOMY;  Surgeon: Ronnette Juniper, MD;  Location[facility] ENDOSCOPY;  Service: Gastroenterology;;  . TONSILLECTOMY AND ADENOIDECTOMY  1949  . UMBILICAL HERNIA REPAIR  09/2017    Current Outpatient Medications  Medication Sig Dispense Refill  . acetaminophen (TYLENOL) 500 MG tablet Take  1,000 mg by mouth every 6 (six) hours as needed for headache (pain).    Marland Kitchen atorvastatin (LIPITOR) 10 MG tablet Take 10 mg by mouth daily at 6 PM.     . cholecalciferol (VITAMIN D) 1000 units tablet Take 1,000 Units by mouth at bedtime.     . furosemide (LASIX) 20 MG tablet Take 20 mg by mouth daily as needed for fluid or edema.     Marland Kitchen losartan  (COZAAR) 25 MG tablet TAKE 1/2 TABLET BY MOUTH AT BEDTIME (Cut tabs in half) 45 tablet 4  . meclizine (ANTIVERT) 25 MG tablet Take 25 mg by mouth 3 (three) times daily as needed for dizziness.    . metoprolol succinate (TOPROL-XL) 100 MG 24 hr tablet TAKE 1 AND 1/2 TABLETS BY MOUTH TWICE DAILY 270 tablet 1  . Omega-3 Fatty Acids (FISH OIL) 1000 MG CAPS Take 1,000 mg by mouth daily.     Marland Kitchen oxymetazoline (AFRIN) 0.05 % nasal spray Place 1 spray into both nostrils daily as needed for congestion.    . pantoprazole (PROTONIX) 40 MG tablet Take 40 mg by mouth daily.    . phenylephrine (NEO-SYNEPHRINE) 1 % nasal spray Place 1 drop into both nostrils daily as needed for congestion.    . Tamsulosin HCl (FLOMAX) 0.4 MG CAPS Take 0.4 mg by mouth at bedtime.     . vitamin C (ASCORBIC ACID) 500 MG tablet Take 500 mg by mouth daily.    Marland Kitchen warfarin (COUMADIN) 5 MG tablet Take 5 mg by mouth at bedtime.      No current facility-administered medications for this visit.   Allergies:  Patient has no known allergies.   ROS:  Hearing loss.  Physical Exam: VS:  BP 118/82   Pulse 81   Ht 5\' 8"  (1.727 m)   Wt 193 lb (87.5 kg)   SpO2 98%   BMI 29.35 kg/m , BMI Body mass index is 29.35 kg/m.  Wt Readings from Last 3 Encounters:  10/31/19 193 lb (87.5 kg)  09/20/19 193 lb (87.5 kg)  07/15/19 212 lb (96.2 kg)    General: Patient appears comfortable at rest. HEENT: Conjunctiva and lids normal, wearing a mask. Neck: Supple, no elevated JVP or carotid bruits, no thyromegaly. Lungs: Clear to auscultation, nonlabored breathing at rest. Cardiac: Regular rate and rhythm, no S3, crisp prosthetic click in S2 with 2/6 systolic murmur. Extremities: No pitting edema, distal pulses 2+.  ECG:  An ECG dated 09/20/2019 was personally reviewed today and demonstrated:  Ventricular pacing with atrial fibrillation.  Recent Labwork:  July 2021: Hemoglobin 9.1, platelets 161, potassium 3.6, BUN 17, creatinine 1.1  Other  Studies Reviewed Today:  Echocardiogram 04/17/2019: 1. Left ventricular ejection fraction, by estimation, is 50 to 55%. The  left ventricle has low normal function. The left ventricle has no regional  wall motion abnormalities. Left ventricular diastolic parameters are  indeterminate.  2. Right ventricular systolic function is normal. The right ventricular  size is normal. There is normal pulmonary artery systolic pressure. The  estimated right ventricular systolic pressure is 18.2 mmHg.  3. Left atrial size was mildly dilated.  4. Right atrial size was upper normal.  5. The mitral valve is grossly normal. Mild to moderate mitral valve  regurgitation.  6. Tricuspid valve regurgitation is moderate.  7. The aortic valve has been repaired/replaced. There is a mechanical  prosthesis in postion with grossly normal function. Aortic valve  regurgitation is trivial. Aortic valve mean gradient measures 8.0 mmHg.  8. The inferior vena cava is normal in size with greater than 50%  respiratory variability, suggesting right atrial pressure of 3 mmHg.   Assessment and Plan:  1.  Nonischemic cardiomyopathy, LVEF 50 to 55% as of March.  Continue losartan, Toprol-XL, and Lasix.  2.  Aortic valve disease status post Bentall procedure and mechanical AVR.  Prosthetic function stable by echocardiogram in March.  He continues on Coumadin with follow-up by Dr. Manuella Ghazi.  3.  Permanent atrial fibrillation/flutter.  He is asymptomatic in terms of palpitations.  He is on Coumadin for stroke prophylaxis.  4.  [employer] Jude CRT-D in place.  He continues to follow with Dr. Rayann Heman.  Device is nearing need for generator change.  Medication Adjustments/Labs and Tests Ordered: Current medicines are reviewed at length with the patient today.  Concerns regarding medicines are outlined above.   Tests Ordered: No orders of the defined types were placed in this encounter.   Medication Changes: No orders of the defined  types were placed in this encounter.   Disposition:  Follow up 6 months in the [place] office.  Signed, Satira Sark, MD, [facility] 10/31/2019 3:43 PM    [facility] [place], [place], [place] [address] Phone: [phone]; Fax: [phone]

## 2019-10-31 NOTE — Patient Instructions (Addendum)
Your physician recommends that you schedule a follow-up appointment in: 6 MONTHS WITH DR MCDOWELL  Your physician recommends that you continue on your current medications as directed. Please refer to the Current Medication list given to you today.  Thank you for choosing [facility]!!    

## 2019-11-04 DIAGNOSIS — J471 Bronchiectasis with (acute) exacerbation: Secondary | ICD-10-CM | POA: Diagnosis not present

## 2019-11-04 DIAGNOSIS — G809 Cerebral palsy, unspecified: Secondary | ICD-10-CM | POA: Diagnosis not present

## 2019-11-04 NOTE — Telephone Encounter (Signed)
Spoke with pt.  Adapter received.  Merlin monitor is up to date as of 11/02/19.  Advised that pt will need to send manual transmission on 11/11/19 due to schedule update in [place].  He verbalizes understanding and agreement with plan.

## 2019-11-11 ENCOUNTER — Ambulatory Visit (INDEPENDENT_AMBULATORY_CARE_PROVIDER_SITE_OTHER): Payer: Medicare HMO | Admitting: Emergency Medicine

## 2019-11-11 ENCOUNTER — Ambulatory Visit (INDEPENDENT_AMBULATORY_CARE_PROVIDER_SITE_OTHER): Payer: Medicare Other

## 2019-11-11 DIAGNOSIS — Z95810 Presence of automatic (implantable) cardiac defibrillator: Secondary | ICD-10-CM | POA: Diagnosis not present

## 2019-11-11 DIAGNOSIS — I428 Other cardiomyopathies: Secondary | ICD-10-CM

## 2019-11-11 DIAGNOSIS — I5022 Chronic systolic (congestive) heart failure: Secondary | ICD-10-CM | POA: Diagnosis not present

## 2019-11-11 NOTE — Progress Notes (Signed)
EPIC Encounter for ICM Monitoring  Patient Name: Timothy Fritz. is a 79 y.o. male Date: 11/11/2019 Primary Care Physican: [name], MD Primary Cardiologist:McDowell Electrophysiologist: Allred Bi-V Pacing: 94% 9/27/2021Weight:189lbs   Battery Longevity: <19months   Spoke with patient and reports feeling well at this time.  Denies fluid symptoms.    Corvue thoracic impedance trending slightly below baseline normal.  Prescribed:Furosemide 20 mg 1 tablet dailyas needed.  Labs: 09/06/2019 Creatinine 1.10, BUN 17, Potassium 3.6, Sodium 144, GFR 64-73 Care Everywhere 09/05/2019 Creatinine 1.21, BUN 17, Potassium 3.9, Sodium 141, GFR 57-65 Care Everywhere A complete set of results can be found in Results Review.  Recommendations:Advised to take 1 PRN Lasix x 1 day and avoid foods high in salt.   Follow-up plan: ICM clinic phone appointment on10/06/2019 to recheck fluid levels.91 day device clinic remote transmission10/28/2021.   EP/Cardiology Office Visits: 05/06/2020 with Dr. Domenic Polite.    Copy of ICM check sent to Dr.Allred..   3 month ICM trend: 11/11/2019    1 Year ICM trend:       [name], RN 11/11/2019 12:47 PM

## 2019-11-13 LAB — CUP PACEART REMOTE DEVICE CHECK
Battery Remaining Longevity: 1 mo
Battery Remaining Percentage: 2 %
Battery Voltage: 2.6 V
Date Time Interrogation Session: 20210928121126
HighPow Impedance: 52 Ohm
HighPow Impedance: 52 Ohm
Implantable Lead Implant Date: 20140529
Implantable Lead Implant Date: 20140529
Implantable Lead Implant Date: 20140529
Implantable Lead Location: 753858
Implantable Lead Location: 753859
Implantable Lead Location: 753860
Implantable Pulse Generator Implant Date: 20140529
Lead Channel Impedance Value: 360 Ohm
Lead Channel Impedance Value: 600 Ohm
Lead Channel Pacing Threshold Amplitude: 1 V
Lead Channel Pacing Threshold Amplitude: 1.125 V
Lead Channel Pacing Threshold Pulse Width: 0.5 ms
Lead Channel Pacing Threshold Pulse Width: 0.5 ms
Lead Channel Sensing Intrinsic Amplitude: 11.7 mV
Lead Channel Setting Pacing Amplitude: 2 V
Lead Channel Setting Pacing Amplitude: 2.125
Lead Channel Setting Pacing Pulse Width: 0.5 ms
Lead Channel Setting Pacing Pulse Width: 0.5 ms
Lead Channel Setting Sensing Sensitivity: 0.5 mV
Pulse Gen Serial Number: 7097390

## 2019-11-14 NOTE — Progress Notes (Signed)
Remote ICD transmission.   

## 2019-11-19 ENCOUNTER — Ambulatory Visit (INDEPENDENT_AMBULATORY_CARE_PROVIDER_SITE_OTHER): Payer: Medicare Other

## 2019-11-19 ENCOUNTER — Telehealth: Payer: Self-pay | Admitting: Internal Medicine

## 2019-11-19 DIAGNOSIS — I10 Essential (primary) hypertension: Secondary | ICD-10-CM | POA: Diagnosis not present

## 2019-11-19 DIAGNOSIS — Z299 Encounter for prophylactic measures, unspecified: Secondary | ICD-10-CM | POA: Diagnosis not present

## 2019-11-19 DIAGNOSIS — I5022 Chronic systolic (congestive) heart failure: Secondary | ICD-10-CM

## 2019-11-19 DIAGNOSIS — I4891 Unspecified atrial fibrillation: Secondary | ICD-10-CM | POA: Diagnosis not present

## 2019-11-19 DIAGNOSIS — Z95810 Presence of automatic (implantable) cardiac defibrillator: Secondary | ICD-10-CM

## 2019-11-19 DIAGNOSIS — I509 Heart failure, unspecified: Secondary | ICD-10-CM | POA: Diagnosis not present

## 2019-11-19 NOTE — Telephone Encounter (Signed)
Patient came into the office stating his defib has gone off and he thinks it's starting to run down  Please call [phone]

## 2019-11-19 NOTE — Telephone Encounter (Signed)
Patient returning phone call. Advised patient his device has reached ERI. Advised patient scheduling will call him to set up apt. To discuss gen change. Verbalized understanding.

## 2019-11-19 NOTE — Telephone Encounter (Signed)
Merlin remote recived 11/19/19. ERI reached as of 11/18/19. Call patient to inform. We will need to have patient scheduled with Dr. Rayann Heman to discuss gen change.

## 2019-11-21 ENCOUNTER — Telehealth: Payer: Self-pay

## 2019-11-21 NOTE — Progress Notes (Signed)
EPIC Encounter for ICM Monitoring  Patient Name: Timothy Fritz. is a 79 y.o. male Date: 11/21/2019 Primary Care Physican: [name], MD Primary Cardiologist:McDowell Electrophysiologist: Allred Bi-V Pacing: 94% 9/27/2021Weight:189lbs   Battery ERI reached 11/18/2019   Attempted call to patient and unable to reach.  Transmission reviewed.    Corvue thoracic impedancesuggesting fluid levels return to normal after taking PRN Furosemide.  Prescribed:Furosemide 20 mg 1 tablet dailyas needed.  Labs: 09/06/2019 Creatinine 1.10, BUN 17, Potassium 3.6, Sodium 144, GFR 64-73 Care Everywhere 09/05/2019 Creatinine 1.21, BUN 17, Potassium 3.9, Sodium 141, GFR 57-65 Care Everywhere A complete set of results can be found in Results Review.  Recommendations:Unable to reach.    Follow-up plan: ICM clinic phone appointment on11/02/2019.91 day device clinic remote transmission10/28/2021.   EP/Cardiology Office Visits: 05/06/2020 with Dr. Domenic Polite.    Copy of ICM check sent to Dr.Allred..   3 month ICM trend: 11/20/2019    1 Year ICM trend:       [name], RN 11/21/2019 12:05 PM

## 2019-11-21 NOTE — Telephone Encounter (Signed)
Remote ICM transmission received.  Attempted call to patient regarding ICM remote transmission and no answer.  

## 2019-11-25 ENCOUNTER — Ambulatory Visit: Payer: Medicare Other | Admitting: Internal Medicine

## 2019-11-25 ENCOUNTER — Encounter: Payer: Self-pay | Admitting: *Deleted

## 2019-11-25 ENCOUNTER — Other Ambulatory Visit: Payer: Self-pay

## 2019-11-25 ENCOUNTER — Encounter: Payer: Self-pay | Admitting: Internal Medicine

## 2019-11-25 VITALS — BP 142/80 | HR 84 | Ht 68.0 in | Wt 196.8 lb

## 2019-11-25 DIAGNOSIS — I4819 Other persistent atrial fibrillation: Secondary | ICD-10-CM

## 2019-11-25 DIAGNOSIS — I10 Essential (primary) hypertension: Secondary | ICD-10-CM

## 2019-11-25 DIAGNOSIS — I428 Other cardiomyopathies: Secondary | ICD-10-CM | POA: Diagnosis not present

## 2019-11-25 DIAGNOSIS — I519 Heart disease, unspecified: Secondary | ICD-10-CM | POA: Diagnosis not present

## 2019-11-25 NOTE — Telephone Encounter (Signed)
Noted! Thank you

## 2019-11-25 NOTE — Patient Instructions (Addendum)
Medication Instructions:  Your physician recommends that you continue on your current medications as directed. Please refer to the Current Medication list given to you today.  *If you need a refill on your cardiac medications before your next appointment, please call your pharmacy*  Lab Work: cbc, bmp  If you have labs (blood work) drawn today and your tests are completely normal, you will receive your results only by: Marland Kitchen MyChart Message (if you have MyChart) OR . A paper copy in the mail If you have any lab test that is abnormal or we need to change your treatment, we will call you to review the results.  Testing/Procedures: None ordered.  Follow-Up: At [facility], you and your health needs are our priority.  As part of our continuing mission to provide you with exceptional heart care, we have created designated Provider Care Teams.  These Care Teams include your primary Cardiologist (physician) and Advanced Practice Providers (APPs -  Physician Assistants and Nurse Practitioners) who all work together to provide you with the care you need, when you need it.  We recommend signing up for the patient portal called "MyChart".  Sign up information is provided on this After Visit Summary.  MyChart is used to connect with patients for Virtual Visits (Telemedicine).  Patients are able to view lab/test results, encounter notes, upcoming appointments, etc.  Non-urgent messages can be sent to your provider as well.   To learn more about what you can do with MyChart, go to [email or web address].       Other Instructions:  Cardioverter Defibrillator Implantation, Care After This sheet gives you information about how to care for yourself after your procedure. Your health care provider may also give you more specific instructions. If you have problems or questions, contact your health care provider. What can I expect after the procedure? After the procedure, it is common to have:  Some pain.  It may last a few days.  A slight bump over the skin where the device was placed. Sometimes, it is possible to feel the device under the skin. This is normal. During the months and years after your procedure, your health care provider will check the device, the leads, and the battery every few months. Eventually, when the battery is low, the device will be replaced. Follow these instructions at home: Medicines  Take over-the-counter and prescription medicines only as told by your health care provider.  If you were prescribed an antibiotic medicine, take it as told by your health care provider. Do not stop taking the antibiotic even if you start to feel better. Incision care      Follow instructions from your health care provider about how to take care of your incision area. Make sure you: ? Wash your hands with soap and water before you change your bandage (dressing). If soap and water are not available, use hand sanitizer. ? Change your dressing as told by your health care provider. ? Leave stitches (sutures), skin glue, or adhesive strips in place. These skin closures may need to stay in place for 2 weeks or longer. If adhesive strip edges start to loosen and curl up, you may trim the loose edges. Do not remove adhesive strips completely unless your health care provider tells you to do that.  Check your incision area every day for signs of infection. Check for: ? More redness, swelling, or pain. ? More fluid or blood. ? Warmth. ? Pus or a bad smell.  Do not use lotions or  ointments near the incision area unless told by your health care provider.  Keep the incision area clean and dry for 2-3 days after the procedure or for as long as told by your health care provider. It takes several weeks for the incision site to heal completely.  Do not take baths, swim, or use a hot tub until your health care provider approves. Activity  Try to walk a little every day. Exercising is important  after this procedure. Also, use your shoulder on the side of the defibrillator in daily tasks that do not require a lot of motion.  For at least 6 weeks: ? Do not lift your upper arm above your shoulders. This means no tennis, golf, or swimming for this period of time. If you tend to sleep with your arm above your head, use a restraint to prevent this during sleep. ? Avoid sudden jerking, pulling, or chopping movements that pull your upper arm far away from your body.  Ask your health care provider when you may go back to work.  Check with your health care provider before you start to drive or play sports. Electric and magnetic fields  Tell all health care providers that you have a defibrillator. This may prevent them from giving you an MRI scan because strong magnets are used for that test.  If you must pass through a metal detector, quickly walk through it. Do not stop under the detector, and do not stand near it.  Avoid places or objects that have a strong electric or magnetic field, including: ? Airport Herbalist. At the airport, let officials know that you have a defibrillator. Your defibrillator ID card will let you be checked in a way that is safe for you and will not damage your defibrillator. Also, do not let a security person wave a magnetic wand near your defibrillator. That can make it stop working. ? Power plants. ? Large electrical generators. ? Anti-theft systems or electronic article surveillance (EAS). ? Radiofrequency transmission towers, such as cell phone and radio towers.  Do not use amateur (ham) radio equipment or electric (arc) welding torches. Some devices are safe to use if held at least 12 inches (30 cm) from your defibrillator. These include power tools, lawn mowers, and speakers. If you are unsure if something is safe to use, ask your health care provider.  Do not use MP3 player headphones. They have magnets.  You may safely use electric blankets, heating  pads, computers, and microwave ovens.  When using your cell phone, hold it to the ear that is on the opposite side from the defibrillator. Do not leave your cell phone in a pocket over the defibrillator. General instructions  Follow diet instructions from your health care provider, if this applies.  Always keep your defibrillator ID card with you. The card should list the implant date, device model, and manufacturer. Consider wearing a medical alert bracelet or necklace.  Have your defibrillator checked every 3-6 months or as often as told by your health care provider. Most defibrillators last for 4-8 years.  Keep all follow-up visits as told by your health care provider. This is important for your health care provider to make sure your chest is healing the way it should. Ask your health care provider when you should come back to have your stitches or staples taken out. Contact a health care provider if:  You feel one shock in your chest.  You gain weight suddenly.  Your legs or feet swell  more than they have before.  It feels like your heart is fluttering or skipping beats (heart palpitations).  You have more redness, swelling, or pain around your incision.  You have more fluid or blood coming from your incision.  Your incision feels warm to the touch.  You have pus or a bad smell coming from your incision.  You have a fever. Get help right away if:  You have chest pain.  You feel more than one shock.  You feel more short of breath than you have felt before.  You feel more light-headed than you have felt before.  Your incision starts to open up. This information is not intended to replace advice given to you by your health care provider. Make sure you discuss any questions you have with your health care provider. Document Revised: 04/27/2017 Document Reviewed: 07/08/2015 Elsevier Patient Education  [employer].

## 2019-11-25 NOTE — H&P (View-Only) (Signed)
PCP: [name], MD   Primary EP: Dr Rayann Heman  Timothy Fowler Sr. is a 79 y.o. male who presents today for routine electrophysiology followup.  Since last being seen in our clinic, the patient reports doing very well.  Today, he denies symptoms of palpitations, chest pain, shortness of breath,  lower extremity edema, dizziness, presyncope, syncope, or ICD shocks.  The patient is otherwise without complaint today.   Past Medical History:  Diagnosis Date  . AICD (automatic cardioverter/defibrillator) present 07/11/2012  . Aortic aneurysm, thoracic ([place])    Fusiform s/p Bentall procedure 2002  . Aortic valve, bicuspid    Mechanical AVR  . Basal cell carcinoma of face   . BPH (benign prostatic hypertrophy)   . Coronary atherosclerosis of native coronary artery    Nonobstructive at cardiac catherization 2002  . Essential hypertension   . Left bundle branch block   . Mixed hyperlipidemia   . Nonischemic cardiomyopathy (HCC)    CRT-D  . Persistent atrial fibrillation [facility])    Past Surgical History:  Procedure Laterality Date  . BENTALL PROCEDURE  2002   Dr Cyndia Bent   . BI-VENTRICULAR IMPLANTABLE CARDIOVERTER DEFIBRILLATOR N/A 07/12/2012   Procedure: BI-VENTRICULAR IMPLANTABLE CARDIOVERTER DEFIBRILLATOR  (CRT-D);  Surgeon: [name], MD;  Location: [facility] CATH LAB;  Service: Cardiovascular;  Laterality: N/A;  . BI-VENTRICULAR IMPLANTABLE CARDIOVERTER DEFIBRILLATOR  (CRT-D)  07/11/2012   [facility]- Dr. Rayann Heman  . CARDIAC CATHETERIZATION  2002  . CARDIAC VALVE REPLACEMENT     AVR  . CATARACT EXTRACTION Left 01/21/13  . COLONOSCOPY N/A 12/01/2016   Procedure: COLONOSCOPY;  Surgeon: Rogene Houston, MD;  [facility];  Service: Endoscopy;  Laterality: N/A;  2:00  . COLONOSCOPY WITH PROPOFOL N/A 11/08/2017   Procedure: COLONOSCOPY WITH PROPOFOL;  Surgeon: Ronnette Juniper, MD;  Location: [place];  Service: Gastroenterology;  Laterality: N/A;  .  ESOPHAGOGASTRODUODENOSCOPY (EGD) WITH PROPOFOL N/A 11/08/2017   Procedure: ESOPHAGOGASTRODUODENOSCOPY (EGD) WITH PROPOFOL Needs INR<2;  Surgeon: Ronnette Juniper, MD;  Location: [place];  Service: Gastroenterology;  Laterality: N/A;  . HERNIA REPAIR    . IR ANGIOGRAM SELECTIVE EACH ADDITIONAL VESSEL  11/10/2017  . IR ANGIOGRAM VISCERAL SELECTIVE  11/10/2017  . IR ANGIOGRAM VISCERAL SELECTIVE  11/10/2017  . IR ANGIOGRAM VISCERAL SELECTIVE  11/10/2017  . IR EMBO ART  VEN HEMORR LYMPH EXTRAV  INC GUIDE ROADMAPPING  11/10/2017  . IR US GUIDE VASC ACCESS RIGHT  11/10/2017  . POLYPECTOMY  11/08/2017   Procedure: POLYPECTOMY;  Surgeon: Ronnette Juniper, MD;  Location: [facility] ENDOSCOPY;  Service: Gastroenterology;;  . TONSILLECTOMY AND ADENOIDECTOMY  1949  . UMBILICAL HERNIA REPAIR  09/2017    ROS- all systems are reviewed and negative except as per HPI above  Current Outpatient Medications  Medication Sig Dispense Refill  . acetaminophen (TYLENOL) 500 MG tablet Take 1,000 mg by mouth every 6 (six) hours as needed for headache (pain).    Marland Kitchen atorvastatin (LIPITOR) 10 MG tablet Take 10 mg by mouth daily at 6 PM.     . cholecalciferol (VITAMIN D) 1000 units tablet Take 1,000 Units by mouth at bedtime.     . donepezil (ARICEPT) 5 MG tablet Take 5 mg by mouth daily.    . finasteride (PROSCAR) 5 MG tablet Take 5 mg by mouth daily.    . furosemide (LASIX) 20 MG tablet Take 20 mg by mouth daily as needed for fluid or edema.     Marland Kitchen losartan (COZAAR) 25  MG tablet TAKE 1/2 TABLET BY MOUTH AT BEDTIME (Cut tabs in half) 45 tablet 4  . meclizine (ANTIVERT) 25 MG tablet Take 25 mg by mouth 3 (three) times daily as needed for dizziness.    . metoprolol succinate (TOPROL-XL) 100 MG 24 hr tablet TAKE 1 AND 1/2 TABLETS BY MOUTH TWICE DAILY 270 tablet 1  . Omega-3 Fatty Acids (FISH OIL) 1000 MG CAPS Take 1,000 mg by mouth daily.     Marland Kitchen oxymetazoline (AFRIN) 0.05 % nasal spray Place 1 spray into both nostrils daily as needed for  congestion.    . pantoprazole (PROTONIX) 40 MG tablet Take 40 mg by mouth daily.    . phenylephrine (NEO-SYNEPHRINE) 1 % nasal spray Place 1 drop into both nostrils daily as needed for congestion.    . Tamsulosin HCl (FLOMAX) 0.4 MG CAPS Take 0.4 mg by mouth at bedtime.     . vitamin C (ASCORBIC ACID) 500 MG tablet Take 500 mg by mouth daily.    Marland Kitchen warfarin (COUMADIN) 5 MG tablet Take 5 mg by mouth at bedtime.      No current facility-administered medications for this visit.    Physical Exam: Vitals:   11/25/19 1601  BP: (!) 142/80  Pulse: 84  SpO2: 96%  Weight: 196 lb 12.8 oz (89.3 kg)  Height: 5\' 8"  (1.727 m)    GEN- The patient is well appearing, alert and oriented x 3 today.   Head- normocephalic, atraumatic Eyes-  Sclera clear, conjunctiva pink Ears- hearing intact Oropharynx- clear Lungs- Clear to ausculation bilaterally, normal work of breathing Chest- ICD pocket is well healed Heart- Regular rate and rhythm, no murmurs, rubs or gallops, PMI not laterally displaced GI- soft, NT, ND, + BS Extremities- no clubbing, cyanosis, or edema  ICD interrogation- reviewed in detail today,  See PACEART report  Echo 04/17/19- EF 50-55%mild to moderate MR, moderate TR  ekg tracing ordered today is personally reviewed and shows afib, BiV paced with fusion beats at times  Wt Readings from Last 3 Encounters:  11/25/19 196 lb 12.8 oz (89.3 kg)  10/31/19 193 lb (87.5 kg)  09/20/19 193 lb (87.5 kg)    Assessment and Plan:  1.  Chronic systolic dysfunction/ nonischemic euvolemic today He has reached ERI Stable on an appropriate medical regimen Normal BiV ICD function See Pace Art report No changes today he is not device dependant today followed in ICM device clinic Risks, benefits, and alternatives to BiV ICD pulse generator replacement were discussed in detail today.  The patient understands that risks include but are not limited to bleeding, infection, pneumothorax, perforation,  tamponade, vascular damage, renal failure, MI, stroke, death, inappropriate shocks, damage to his existing leads, and lead dislodgement and wishes to proceed.  We will therefore schedule the procedure at the next available time. Hold coumadin 48 hours prior to the procedure  2. Permanent afib Mostly rate controlled but does not BiV pace 95% I have offered AV nodal ablation several times and he has declined.  3. Mechanical AVR s/p Bentall Continue coumadin  4. HTN Stable No change required today  Risks, benefits and potential toxicities for medications prescribed and/or refilled reviewed with patient today.   [name] MD, [facility] 11/25/2019 4:13 PM

## 2019-11-25 NOTE — Progress Notes (Signed)
PCP: [name], MD   Primary EP: Dr Rayann Heman  Timothy Fowler Sr. is a 79 y.o. male who presents today for routine electrophysiology followup.  Since last being seen in our clinic, the patient reports doing very well.  Today, he denies symptoms of palpitations, chest pain, shortness of breath,  lower extremity edema, dizziness, presyncope, syncope, or ICD shocks.  The patient is otherwise without complaint today.   Past Medical History:  Diagnosis Date  . AICD (automatic cardioverter/defibrillator) present 07/11/2012  . Aortic aneurysm, thoracic ([place])    Fusiform s/p Bentall procedure 2002  . Aortic valve, bicuspid    Mechanical AVR  . Basal cell carcinoma of face   . BPH (benign prostatic hypertrophy)   . Coronary atherosclerosis of native coronary artery    Nonobstructive at cardiac catherization 2002  . Essential hypertension   . Left bundle branch block   . Mixed hyperlipidemia   . Nonischemic cardiomyopathy (HCC)    CRT-D  . Persistent atrial fibrillation [facility])    Past Surgical History:  Procedure Laterality Date  . BENTALL PROCEDURE  2002   Dr Cyndia Bent   . BI-VENTRICULAR IMPLANTABLE CARDIOVERTER DEFIBRILLATOR N/A 07/12/2012   Procedure: BI-VENTRICULAR IMPLANTABLE CARDIOVERTER DEFIBRILLATOR  (CRT-D);  Surgeon: [name], MD;  Location: [facility] CATH LAB;  Service: Cardiovascular;  Laterality: N/A;  . BI-VENTRICULAR IMPLANTABLE CARDIOVERTER DEFIBRILLATOR  (CRT-D)  07/11/2012   [facility]- Dr. Rayann Heman  . CARDIAC CATHETERIZATION  2002  . CARDIAC VALVE REPLACEMENT     AVR  . CATARACT EXTRACTION Left 01/21/13  . COLONOSCOPY N/A 12/01/2016   Procedure: COLONOSCOPY;  Surgeon: Rogene Houston, MD;  [facility];  Service: Endoscopy;  Laterality: N/A;  2:00  . COLONOSCOPY WITH PROPOFOL N/A 11/08/2017   Procedure: COLONOSCOPY WITH PROPOFOL;  Surgeon: Ronnette Juniper, MD;  Location: [place];  Service: Gastroenterology;  Laterality: N/A;  .  ESOPHAGOGASTRODUODENOSCOPY (EGD) WITH PROPOFOL N/A 11/08/2017   Procedure: ESOPHAGOGASTRODUODENOSCOPY (EGD) WITH PROPOFOL Needs INR<2;  Surgeon: Ronnette Juniper, MD;  Location: [place];  Service: Gastroenterology;  Laterality: N/A;  . HERNIA REPAIR    . IR ANGIOGRAM SELECTIVE EACH ADDITIONAL VESSEL  11/10/2017  . IR ANGIOGRAM VISCERAL SELECTIVE  11/10/2017  . IR ANGIOGRAM VISCERAL SELECTIVE  11/10/2017  . IR ANGIOGRAM VISCERAL SELECTIVE  11/10/2017  . IR EMBO ART  VEN HEMORR LYMPH EXTRAV  INC GUIDE ROADMAPPING  11/10/2017  . IR US GUIDE VASC ACCESS RIGHT  11/10/2017  . POLYPECTOMY  11/08/2017   Procedure: POLYPECTOMY;  Surgeon: Ronnette Juniper, MD;  Location: [facility] ENDOSCOPY;  Service: Gastroenterology;;  . TONSILLECTOMY AND ADENOIDECTOMY  1949  . UMBILICAL HERNIA REPAIR  09/2017    ROS- all systems are reviewed and negative except as per HPI above  Current Outpatient Medications  Medication Sig Dispense Refill  . acetaminophen (TYLENOL) 500 MG tablet Take 1,000 mg by mouth every 6 (six) hours as needed for headache (pain).    Marland Kitchen atorvastatin (LIPITOR) 10 MG tablet Take 10 mg by mouth daily at 6 PM.     . cholecalciferol (VITAMIN D) 1000 units tablet Take 1,000 Units by mouth at bedtime.     . donepezil (ARICEPT) 5 MG tablet Take 5 mg by mouth daily.    . finasteride (PROSCAR) 5 MG tablet Take 5 mg by mouth daily.    . furosemide (LASIX) 20 MG tablet Take 20 mg by mouth daily as needed for fluid or edema.     Marland Kitchen losartan (COZAAR) 25  MG tablet TAKE 1/2 TABLET BY MOUTH AT BEDTIME (Cut tabs in half) 45 tablet 4  . meclizine (ANTIVERT) 25 MG tablet Take 25 mg by mouth 3 (three) times daily as needed for dizziness.    . metoprolol succinate (TOPROL-XL) 100 MG 24 hr tablet TAKE 1 AND 1/2 TABLETS BY MOUTH TWICE DAILY 270 tablet 1  . Omega-3 Fatty Acids (FISH OIL) 1000 MG CAPS Take 1,000 mg by mouth daily.     Marland Kitchen oxymetazoline (AFRIN) 0.05 % nasal spray Place 1 spray into both nostrils daily as needed for  congestion.    . pantoprazole (PROTONIX) 40 MG tablet Take 40 mg by mouth daily.    . phenylephrine (NEO-SYNEPHRINE) 1 % nasal spray Place 1 drop into both nostrils daily as needed for congestion.    . Tamsulosin HCl (FLOMAX) 0.4 MG CAPS Take 0.4 mg by mouth at bedtime.     . vitamin C (ASCORBIC ACID) 500 MG tablet Take 500 mg by mouth daily.    Marland Kitchen warfarin (COUMADIN) 5 MG tablet Take 5 mg by mouth at bedtime.      No current facility-administered medications for this visit.    Physical Exam: Vitals:   11/25/19 1601  BP: (!) 142/80  Pulse: 84  SpO2: 96%  Weight: 196 lb 12.8 oz (89.3 kg)  Height: 5\' 8"  (1.727 m)    GEN- The patient is well appearing, alert and oriented x 3 today.   Head- normocephalic, atraumatic Eyes-  Sclera clear, conjunctiva pink Ears- hearing intact Oropharynx- clear Lungs- Clear to ausculation bilaterally, normal work of breathing Chest- ICD pocket is well healed Heart- Regular rate and rhythm, no murmurs, rubs or gallops, PMI not laterally displaced GI- soft, NT, ND, + BS Extremities- no clubbing, cyanosis, or edema  ICD interrogation- reviewed in detail today,  See PACEART report  Echo 04/17/19- EF 50-55%mild to moderate MR, moderate TR  ekg tracing ordered today is personally reviewed and shows afib, BiV paced with fusion beats at times  Wt Readings from Last 3 Encounters:  11/25/19 196 lb 12.8 oz (89.3 kg)  10/31/19 193 lb (87.5 kg)  09/20/19 193 lb (87.5 kg)    Assessment and Plan:  1.  Chronic systolic dysfunction/ nonischemic euvolemic today He has reached ERI Stable on an appropriate medical regimen Normal BiV ICD function See Pace Art report No changes today he is not device dependant today followed in ICM device clinic Risks, benefits, and alternatives to BiV ICD pulse generator replacement were discussed in detail today.  The patient understands that risks include but are not limited to bleeding, infection, pneumothorax, perforation,  tamponade, vascular damage, renal failure, MI, stroke, death, inappropriate shocks, damage to his existing leads, and lead dislodgement and wishes to proceed.  We will therefore schedule the procedure at the next available time. Hold coumadin 48 hours prior to the procedure  2. Permanent afib Mostly rate controlled but does not BiV pace 95% I have offered AV nodal ablation several times and he has declined.  3. Mechanical AVR s/p Bentall Continue coumadin  4. HTN Stable No change required today  Risks, benefits and potential toxicities for medications prescribed and/or refilled reviewed with patient today.   [name] MD, [facility] 11/25/2019 4:13 PM

## 2019-11-26 LAB — CBC WITH DIFFERENTIAL/PLATELET
Basophils Absolute: 0.1 10*3/uL (ref 0.0–0.2)
Basos: 1 %
EOS (ABSOLUTE): 0.2 10*3/uL (ref 0.0–0.4)
Eos: 4 %
Hematocrit: 39.3 % (ref 37.5–51.0)
Hemoglobin: 11.9 g/dL — ABNORMAL LOW (ref 13.0–17.7)
Immature Grans (Abs): 0 10*3/uL (ref 0.0–0.1)
Immature Granulocytes: 1 %
Lymphocytes Absolute: 1.5 10*3/uL (ref 0.7–3.1)
Lymphs: 24 %
MCH: 24.3 pg — ABNORMAL LOW (ref 26.6–33.0)
MCHC: 30.3 g/dL — ABNORMAL LOW (ref 31.5–35.7)
MCV: 80 fL (ref 79–97)
Monocytes Absolute: 0.9 10*3/uL (ref 0.1–0.9)
Monocytes: 13 %
Neutrophils Absolute: 3.7 10*3/uL (ref 1.4–7.0)
Neutrophils: 57 %
Platelets: 159 10*3/uL (ref 150–450)
RBC: 4.9 x10E6/uL (ref 4.14–5.80)
RDW: 16.2 % — ABNORMAL HIGH (ref 11.6–15.4)
WBC: 6.5 10*3/uL (ref 3.4–10.8)

## 2019-11-26 LAB — CUP PACEART INCLINIC DEVICE CHECK
Battery Remaining Longevity: 0 mo
Brady Statistic RA Percent Paced: 0 %
Brady Statistic RV Percent Paced: 94 %
Date Time Interrogation Session: 20211011160000
HighPow Impedance: 56.25 Ohm
Implantable Lead Implant Date: 20140529
Implantable Lead Implant Date: 20140529
Implantable Lead Implant Date: 20140529
Implantable Lead Location: 753858
Implantable Lead Location: 753859
Implantable Lead Location: 753860
Implantable Pulse Generator Implant Date: 20140529
Lead Channel Impedance Value: 325 Ohm
Lead Channel Impedance Value: 387.5 Ohm
Lead Channel Impedance Value: 650 Ohm
Lead Channel Pacing Threshold Amplitude: 1 V
Lead Channel Pacing Threshold Amplitude: 1 V
Lead Channel Pacing Threshold Amplitude: 1 V
Lead Channel Pacing Threshold Pulse Width: 0.5 ms
Lead Channel Pacing Threshold Pulse Width: 0.5 ms
Lead Channel Pacing Threshold Pulse Width: 0.5 ms
Lead Channel Sensing Intrinsic Amplitude: 1 mV
Lead Channel Sensing Intrinsic Amplitude: 11.7 mV
Lead Channel Setting Pacing Amplitude: 2 V
Lead Channel Setting Pacing Amplitude: 2 V
Lead Channel Setting Pacing Pulse Width: 0.5 ms
Lead Channel Setting Pacing Pulse Width: 0.5 ms
Lead Channel Setting Sensing Sensitivity: 0.5 mV
Pulse Gen Serial Number: 7097390

## 2019-11-26 LAB — BASIC METABOLIC PANEL
BUN/Creatinine Ratio: 12 (ref 10–24)
BUN: 14 mg/dL (ref 8–27)
CO2: 25 mmol/L (ref 20–29)
Calcium: 8.9 mg/dL (ref 8.6–10.2)
Chloride: 106 mmol/L (ref 96–106)
Creatinine, Ser: 1.16 mg/dL (ref 0.76–1.27)
GFR calc Af Amer: 69 mL/min/{1.73_m2} (ref >59)
GFR calc non Af Amer: 60 mL/min/{1.73_m2} (ref >59)
Glucose: 88 mg/dL (ref 65–99)
Potassium: 4.9 mmol/L (ref 3.5–5.2)
Sodium: 141 mmol/L (ref 134–144)

## 2019-12-02 ENCOUNTER — Telehealth: Payer: Self-pay | Admitting: Internal Medicine

## 2019-12-02 ENCOUNTER — Other Ambulatory Visit (HOSPITAL_COMMUNITY)
Admission: RE | Admit: 2019-12-02 | Discharge: 2019-12-02 | Disposition: A | Discharge status: Home / Self Care | Payer: Medicare Other | Source: Ambulatory Visit | Attending: Internal Medicine | Admitting: Internal Medicine

## 2019-12-02 DIAGNOSIS — Z01818 Encounter for other preprocedural examination: Secondary | ICD-10-CM | POA: Diagnosis not present

## 2019-12-02 DIAGNOSIS — Z20822 Contact with and (suspected) exposure to covid-19: Secondary | ICD-10-CM | POA: Diagnosis not present

## 2019-12-02 LAB — SARS CORONAVIRUS 2 (TAT 6-24 HRS): SARS Coronavirus 2: NEGATIVE

## 2019-12-02 NOTE — Progress Notes (Signed)
Instructed patient on the following items: Arrival time 1400 Nothing to eat or drink after midnight No meds AM of procedure Responsible person to drive you home and stay with you for 24 hrs Wash with special soap night before and morning of procedure If on anti-coagulant drug instructions on Coumadin- last dose was 10/16 in the morning

## 2019-12-02 NOTE — Telephone Encounter (Signed)
Patient states he is returning a call today.

## 2019-12-03 ENCOUNTER — Encounter (HOSPITAL_COMMUNITY)
Admission: RE | Disposition: A | Discharge status: Home / Self Care | Payer: Self-pay | Source: Home / Self Care | Attending: Internal Medicine

## 2019-12-03 ENCOUNTER — Ambulatory Visit (HOSPITAL_COMMUNITY)
Admission: RE | Admit: 2019-12-03 | Discharge: 2019-12-03 | Disposition: A | Discharge status: Home / Self Care | Payer: Medicare Other | Attending: Internal Medicine | Admitting: Internal Medicine

## 2019-12-03 ENCOUNTER — Other Ambulatory Visit: Payer: Self-pay

## 2019-12-03 DIAGNOSIS — I712 Thoracic aortic aneurysm, without rupture: Secondary | ICD-10-CM | POA: Diagnosis not present

## 2019-12-03 DIAGNOSIS — N400 Enlarged prostate without lower urinary tract symptoms: Secondary | ICD-10-CM | POA: Diagnosis not present

## 2019-12-03 DIAGNOSIS — Z7901 Long term (current) use of anticoagulants: Secondary | ICD-10-CM | POA: Insufficient documentation

## 2019-12-03 DIAGNOSIS — E782 Mixed hyperlipidemia: Secondary | ICD-10-CM | POA: Insufficient documentation

## 2019-12-03 DIAGNOSIS — I5022 Chronic systolic (congestive) heart failure: Secondary | ICD-10-CM | POA: Insufficient documentation

## 2019-12-03 DIAGNOSIS — Z85828 Personal history of other malignant neoplasm of skin: Secondary | ICD-10-CM | POA: Insufficient documentation

## 2019-12-03 DIAGNOSIS — I110 Hypertensive heart disease with heart failure: Secondary | ICD-10-CM | POA: Insufficient documentation

## 2019-12-03 DIAGNOSIS — I4821 Permanent atrial fibrillation: Secondary | ICD-10-CM | POA: Insufficient documentation

## 2019-12-03 DIAGNOSIS — Z4502 Encounter for adjustment and management of automatic implantable cardiac defibrillator: Secondary | ICD-10-CM | POA: Diagnosis not present

## 2019-12-03 DIAGNOSIS — I447 Left bundle-branch block, unspecified: Secondary | ICD-10-CM | POA: Diagnosis not present

## 2019-12-03 DIAGNOSIS — I428 Other cardiomyopathies: Secondary | ICD-10-CM | POA: Diagnosis not present

## 2019-12-03 DIAGNOSIS — Z952 Presence of prosthetic heart valve: Secondary | ICD-10-CM | POA: Insufficient documentation

## 2019-12-03 DIAGNOSIS — Z79899 Other long term (current) drug therapy: Secondary | ICD-10-CM | POA: Insufficient documentation

## 2019-12-03 DIAGNOSIS — I2510 Atherosclerotic heart disease of native coronary artery without angina pectoris: Secondary | ICD-10-CM | POA: Insufficient documentation

## 2019-12-03 HISTORY — PX: BIV ICD GENERATOR CHANGEOUT: EP1194

## 2019-12-03 LAB — PROTIME-INR
INR: 2 — ABNORMAL HIGH (ref 0.8–1.2)
Prothrombin Time: 21.8 seconds — ABNORMAL HIGH (ref 11.4–15.2)

## 2019-12-03 SURGERY — BIV ICD GENERATOR CHANGEOUT

## 2019-12-03 MED ORDER — SODIUM CHLORIDE 0.9 % IV SOLN: INTRAVENOUS | Status: DC

## 2019-12-03 MED ORDER — LIDOCAINE HCL (PF) 1 % IJ SOLN
INTRAMUSCULAR | Status: AC
  Filled 2019-12-03: qty 60

## 2019-12-03 MED ORDER — SODIUM CHLORIDE 0.9% FLUSH: 3.0000 mL | INTRAVENOUS | Status: DC | PRN

## 2019-12-03 MED ORDER — ONDANSETRON HCL 4 MG/2ML IJ SOLN: 4.0000 mg | Freq: Four times a day (QID) | INTRAMUSCULAR | Status: DC | PRN

## 2019-12-03 MED ORDER — CEFAZOLIN SODIUM-DEXTROSE 2-4 GM/100ML-% IV SOLN
INTRAVENOUS | Status: AC
  Filled 2019-12-03: qty 100

## 2019-12-03 MED ORDER — SODIUM CHLORIDE 0.9 % IV SOLN: 250.0000 mL | INTRAVENOUS | Status: DC | PRN

## 2019-12-03 MED ORDER — SODIUM CHLORIDE 0.9 % IV SOLN
INTRAVENOUS | Status: AC
  Filled 2019-12-03: qty 2

## 2019-12-03 MED ORDER — CEFAZOLIN SODIUM-DEXTROSE 2-4 GM/100ML-% IV SOLN
2.0000 g | INTRAVENOUS | Status: AC
  Administered 2019-12-03: 2 g via INTRAVENOUS

## 2019-12-03 MED ORDER — LIDOCAINE HCL (PF) 1 % IJ SOLN
INTRAMUSCULAR | Status: DC | PRN
  Administered 2019-12-03: 50 mL

## 2019-12-03 MED ORDER — SODIUM CHLORIDE 0.9 % IV SOLN: 80.0000 mg | INTRAVENOUS | Status: DC

## 2019-12-03 MED ORDER — ACETAMINOPHEN 325 MG PO TABS: 325.0000 mg | ORAL_TABLET | ORAL | Status: DC | PRN

## 2019-12-03 MED ORDER — SODIUM CHLORIDE 0.9% FLUSH: 3.0000 mL | Freq: Two times a day (BID) | INTRAVENOUS | Status: DC

## 2019-12-03 SURGICAL SUPPLY — 4 items
CABLE SURGICAL S-101-97-12 (CABLE) ×3 IMPLANT
ICD GALLANT HFCRTD CDHFA500Q (ICD Generator) ×2 IMPLANT
PAD PRO RADIOLUCENT 2001M-C (PAD) ×3 IMPLANT
TRAY PACEMAKER INSERTION (PACKS) ×3 IMPLANT

## 2019-12-03 NOTE — Interval H&P Note (Signed)
History and Physical Interval Note:  12/03/2019 3:18 PM  Timothy Fowler Sr.  has presented today for surgery, with the diagnosis of bradicardia.  The various methods of treatment have been discussed with the patient and family. After consideration of risks, benefits and other options for treatment, the patient has consented to  Procedure(s): BIV ICD [place] (N/A) as a surgical intervention.  The patient's history has been reviewed, patient examined, no change in status, stable for surgery.  I have reviewed the patient's chart and labs.  Questions were answered to the patient's satisfaction.     ICD Criteria  Current LVEF:50%. Within 12 months prior to implant: Yes   Heart failure history: Yes, Class III  Cardiomyopathy history: Yes, Non-Ischemic Cardiomyopathy.  Atrial Fibrillation/Atrial Flutter: Yes, Permanent.  Ventricular tachycardia history: No.  Cardiac arrest history: No.  History of syndromes with risk of sudden death: No.  Previous ICD: Yes, Reason for ICD:  Primary prevention.  Current ICD indication: Primary  PPM indication: CRT  Class I or II Bradycardia indication present: Yes  Beta Blocker therapy for 3 or more months: Yes, prescribed.   Ace Inhibitor/ARB therapy for 3 or more months: Yes, prescribed.    I have seen Timothy Fowler Sr. is a 79 y.o. male who presents for BiV ICD generator change.  The patient's chart has been reviewed and they meet criteria for BIV ICD generator change.  I have had a thorough discussion with the patient reviewing options.  The patient has had opportunities to ask questions and have them answered. The patient and I have decided together through the [place] Decision Support Tool to proceed with BiV ICD generator change at this time.  Risks, benefits, alternatives to BiV ICD generator change were discussed in detail with the patient today. The patient  understands that the risks include but are not limited  to bleeding, infection, pneumothorax, perforation, tamponade, vascular damage, renal failure, MI, stroke, death, inappropriate shocks, and lead dislodgement and  wishes to proceed.    [name]

## 2019-12-03 NOTE — Discharge Instructions (Signed)
Resume coumadin on 12/05/19 at previous home regimen.   Implantable Cardiac Device Battery Change, Care After  This sheet gives you information about how to care for yourself after your procedure. Your health care provider may also give you more specific instructions. If you have problems or questions, contact your health care provider. What can I expect after the procedure? After your procedure, it is common to have:  Pain or soreness at the site where the cardiac device was inserted.  Swelling at the site where the cardiac device was inserted.  You should received an information card for your new device in 4-8 weeks. Follow these instructions at home: Incision care   Keep the incision clean and dry. ? Do not take baths, swim, or use a hot tub until after your wound check.  ? Do not shower for at least 7 days, or as directed by your health care provider. ? Pat the area dry with a clean towel. Do not rub the area. This may cause bleeding.  Follow instructions from your health care provider about how to take care of your incision. Make sure you: ? Leave stitches (sutures), skin glue, or adhesive strips in place. These skin closures may need to stay in place for 2 weeks or longer. If adhesive strip edges start to loosen and curl up, you may trim the loose edges. Do not remove adhesive strips completely unless your health care provider tells you to do that.  Check your incision area every day for signs of infection. Check for: ? More redness, swelling, or pain. ? More fluid or blood. ? Warmth. ? Pus or a bad smell. Activity  Do not lift anything that is heavier than 10 lb (4.5 kg) until your health care provider says it is okay to do so.  For the first week, or as long as told by your health care provider: ? Avoid lifting your affected arm higher than your shoulder. ? After 1 week, Be gentle when you move your arms over your head. It is okay to raise your arm to comb your  hair. ? Avoid strenuous exercise.  Ask your health care provider when it is okay to: ? Resume your normal activities. ? Return to work or school. ? Resume sexual activity. Eating and drinking  Eat a heart-healthy diet. This should include plenty of fresh fruits and vegetables, whole grains, low-fat dairy products, and lean protein like chicken and fish.  Limit alcohol intake to no more than 1 drink a day for non-pregnant women and 2 drinks a day for men. One drink equals 12 oz of beer, 5 oz of wine, or 1 oz of hard liquor.  Check ingredients and nutrition facts on packaged foods and beverages. Avoid the following types of food: ? Food that is high in salt (sodium). ? Food that is high in saturated fat, like full-fat dairy or red meat. ? Food that is high in trans fat, like fried food. ? Food and drinks that are high in sugar. Lifestyle  Do not use any products that contain nicotine or tobacco, such as cigarettes and e-cigarettes. If you need help quitting, ask your health care provider.  Take steps to manage and control your weight.  Once cleared, get regular exercise. Aim for 150 minutes of moderate-intensity exercise (such as walking or yoga) or 75 minutes of vigorous exercise (such as running or swimming) each week.  Manage other health problems, such as diabetes or high blood pressure. Ask your health care provider  how you can manage these conditions. General instructions  Do not drive for 24 hours after your procedure if you were given a medicine to help you relax (sedative).  Take over-the-counter and prescription medicines only as told by your health care provider.  Avoid putting pressure on the area where the cardiac device was placed.  If you need an MRI after your cardiac device has been placed, be sure to tell the health care provider who orders the MRI that you have a cardiac device.  Avoid close and prolonged exposure to electrical devices that have strong magnetic  fields. These include: ? Cell phones. Avoid keeping them in a pocket near the cardiac device, and try using the ear opposite the cardiac device. ? MP3 players. ? Household appliances, like microwaves. ? Metal detectors. ? Electric generators. ? High-tension wires.  Keep all follow-up visits as directed by your health care provider. This is important. Contact a health care provider if:  You have pain at the incision site that is not relieved by over-the-counter or prescription medicines.  You have any of these around your incision site or coming from it: ? More redness, swelling, or pain. ? Fluid or blood. ? Warmth to the touch. ? Pus or a bad smell.  You have a fever.  You feel brief, occasional palpitations, light-headedness, or any symptoms that you think might be related to your heart. Get help right away if:  You experience chest pain that is different from the pain at the cardiac device site.  You develop a red streak that extends above or below the incision site.  You experience shortness of breath.  You have palpitations or an irregular heartbeat.  You have light-headedness that does not go away quickly.  You faint or have dizzy spells.  Your pulse suddenly drops or increases rapidly and does not return to normal.  You begin to gain weight and your legs and ankles swell. Summary  After your procedure, it is common to have pain, soreness, and some swelling where the cardiac device was inserted.  Make sure to keep your incision clean and dry. Follow instructions from your health care provider about how to take care of your incision.  Check your incision every day for signs of infection, such as more pain or swelling, pus or a bad smell, warmth, or leaking fluid and blood.  Avoid strenuous exercise and lifting your left arm higher than your shoulder for 2 weeks, or as long as told by your health care provider. This information is not intended to replace advice  given to you by your health care provider. Make sure you discuss any questions you have with your health care provider.

## 2019-12-04 ENCOUNTER — Encounter (HOSPITAL_COMMUNITY): Payer: Self-pay | Admitting: Internal Medicine

## 2019-12-13 DIAGNOSIS — I10 Essential (primary) hypertension: Secondary | ICD-10-CM | POA: Diagnosis not present

## 2019-12-13 DIAGNOSIS — Z299 Encounter for prophylactic measures, unspecified: Secondary | ICD-10-CM | POA: Diagnosis not present

## 2019-12-13 DIAGNOSIS — G309 Alzheimer's disease, unspecified: Secondary | ICD-10-CM | POA: Diagnosis not present

## 2019-12-13 DIAGNOSIS — I428 Other cardiomyopathies: Secondary | ICD-10-CM | POA: Diagnosis not present

## 2019-12-13 DIAGNOSIS — I5022 Chronic systolic (congestive) heart failure: Secondary | ICD-10-CM | POA: Diagnosis not present

## 2019-12-16 NOTE — Progress Notes (Unsigned)
ICM remote transmission rescheduled from 11/1 to 12/6 to allow Corvue to be turned on and baseline developed.

## 2019-12-17 ENCOUNTER — Ambulatory Visit: Payer: Medicare Other

## 2019-12-23 DIAGNOSIS — Z299 Encounter for prophylactic measures, unspecified: Secondary | ICD-10-CM | POA: Diagnosis not present

## 2019-12-23 DIAGNOSIS — I509 Heart failure, unspecified: Secondary | ICD-10-CM | POA: Diagnosis not present

## 2019-12-23 DIAGNOSIS — I4891 Unspecified atrial fibrillation: Secondary | ICD-10-CM | POA: Diagnosis not present

## 2019-12-23 DIAGNOSIS — I10 Essential (primary) hypertension: Secondary | ICD-10-CM | POA: Diagnosis not present

## 2019-12-28 IMAGING — US IR EMBO ART  VEN HEMORR LYMPH EXTRAV  INC GUIDE ROADMAPPING
1 series · 1 of 1 positions shown · non-contrast
Comparison: None.

CLINICAL DATA: Gastrointestinal bleeding

EXAM:
IR MARJOLIJN ROSALINA HEMORR LYMPH EXTRAV  INC GUIDE ROADMAPPING

[Series 1: ir embo art ven hemorr lymph extrav inc guide road · 1 of 1 slices shown]
[im 1/1]
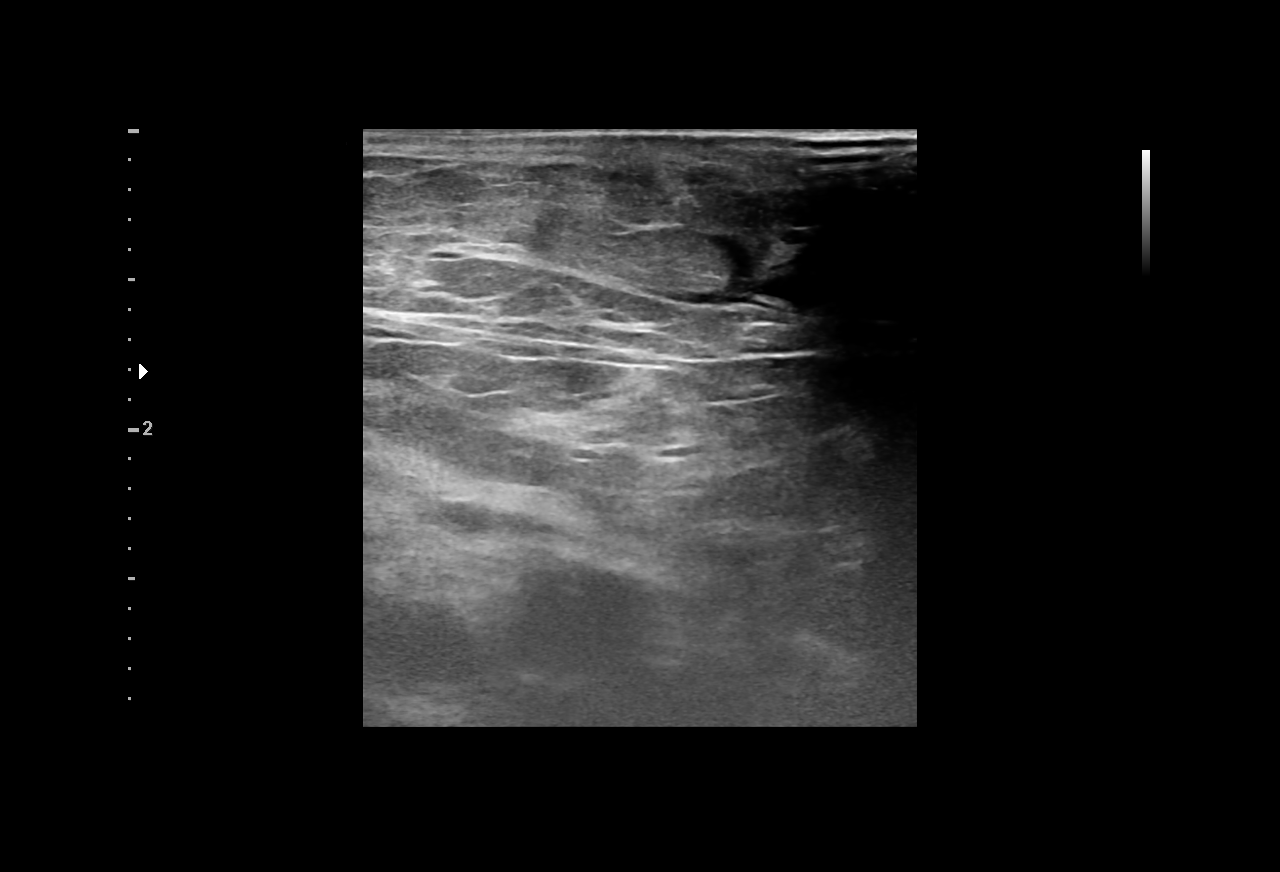

[1 of 1 positions shown; findings below may reference images not displayed]

FINDINGS: Please refer to accession number [id number].
IMPRESSION: Please refer to accession number [id number].

## 2019-12-28 IMAGING — NM NM GI BLOOD LOSS
2 series · 12 of 12 positions shown · non-contrast
Comparison: None.

CLINICAL DATA: Melena/hematochezia this morning. Recent endoscopy
demonstrated antral ulcers and 3 large duodenal bulbs ulcers.
fibrillation.

EXAM:
NUCLEAR MEDICINE GASTROINTESTINAL BLEEDING SCAN
TECHNIQUE: Sequential abdominal images were obtained following intravenous
administration of Uc-11m labeled red blood cells.
RADIOPHARMACEUTICALS:  26.3 mCi Uc-11m pertechnetate in-vitro
labeled red cells.

[gi gi bleed · 4.52mm/px · 6 of 60 frames shown (1 of 2)]
[frame 6/60]
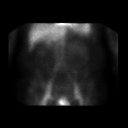
[frame 16/60]
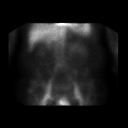
[frame 26/60]
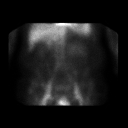
[frame 36/60]
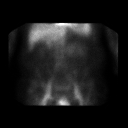
[frame 46/60]
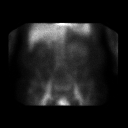
[frame 56/60]
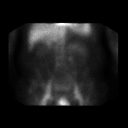

[gi gi bleed · 4.52mm/px · 6 of 60 frames shown (2 of 2)]
[frame 6/60]
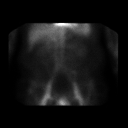
[frame 16/60]
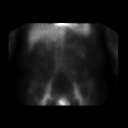
[frame 26/60]
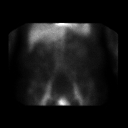
[frame 36/60]
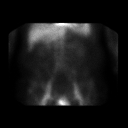
[frame 46/60]
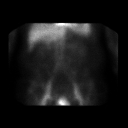
[frame 56/60]
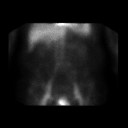

[12 of 12 positions shown; findings below may reference images not displayed]

FINDINGS: There is a small focus abnormal radiotracer activity which appears
to which to originate within the proximal small bowel. The overall
amount of abnormal radiotracer activity is very small suggesting a
small, transient bleed.

Physiologic activity is seen within the heart, liver, urinary
bladder, abdominal aorta and pelvic vasculature.
IMPRESSION: Suspected transient proximal small bowel bleed.

Critical Value/emergent results were called by telephone at the time
of interpretation on 11/10/2017 at 1131 to Dr. GESSYCA SILVA ANDRADE , who
verbally acknowledged these results.

## 2019-12-29 NOTE — Progress Notes (Signed)
Cardiology Office Note Date:  12/30/2019  Patient ID:  Timothy NIKOLAI Sr., DOB 1940/10/20, MRN [id number] PCP:  [name], MD  Cardiologist:  Dr. Domenic Polite Electrophysiologist: Dr. Rayann Heman    Chief Complaint:  wound check post gen change  History of Present Illness: Timothy CHIRIBOGA Sr. is a 79 y.o. male with history of bicuspid AV, thoracic aortic aneurysm (s/p AVR, Bentall procedure 2002), non-obstructive CAD by cath 2002, HTN, LBBB, NICM (w/recovered LVEF), Afib (permanent)  He comes in today to be seen for Dr. Rayann Heman.  Last seen by him Oct 2021 noted his device at [facility] and underwent generator change 12/03/2019   TODAY He feels well, no wound concerns No CP, palpitation or cardiac awareness. No dizzy spells, near syncope or syncope. He denies any exertional intolerances, no SOB or DOE  His PMD manages his warfarin/INRs, reports well controlled, no bleeding or signs of bleeding   Device information SJM CRT-D implanted 07/12/2012 >> gen change 12/03/2019   Past Medical History:  Diagnosis Date  . AICD (automatic cardioverter/defibrillator) present 07/11/2012  . Aortic aneurysm, thoracic ([place])    Fusiform s/p Bentall procedure 2002  . Aortic valve, bicuspid    Mechanical AVR  . Basal cell carcinoma of face   . BPH (benign prostatic hypertrophy)   . Coronary atherosclerosis of native coronary artery    Nonobstructive at cardiac catherization 2002  . Essential hypertension   . Left bundle branch block   . Mixed hyperlipidemia   . Nonischemic cardiomyopathy (HCC)    CRT-D  . Persistent atrial fibrillation [facility])     Past Surgical History:  Procedure Laterality Date  . BENTALL PROCEDURE  2002   Dr Cyndia Bent   . BI-VENTRICULAR IMPLANTABLE CARDIOVERTER DEFIBRILLATOR N/A 07/12/2012   Procedure: BI-VENTRICULAR IMPLANTABLE CARDIOVERTER DEFIBRILLATOR  (CRT-D);  Surgeon: [name], MD;  Location: [facility] CATH LAB;  Service: Cardiovascular;  Laterality: N/A;  . BI-VENTRICULAR  IMPLANTABLE CARDIOVERTER DEFIBRILLATOR  (CRT-D)  07/11/2012   [facility]- Dr. Rayann Heman  . BIV ICD GENERATOR CHANGEOUT N/A 12/03/2019   Procedure: BIV ICD GENERATOR CHANGEOUT;  Surgeon: [name], MD;  Location: [place] CV LAB;  Service: Cardiovascular;  Laterality: N/A;  . CARDIAC CATHETERIZATION  2002  . CARDIAC VALVE REPLACEMENT     AVR  . CATARACT EXTRACTION Left 01/21/13  . COLONOSCOPY N/A 12/01/2016   Procedure: COLONOSCOPY;  Surgeon: Rogene Houston, MD;  [facility];  Service: Endoscopy;  Laterality: N/A;  2:00  . COLONOSCOPY WITH PROPOFOL N/A 11/08/2017   Procedure: COLONOSCOPY WITH PROPOFOL;  Surgeon: Ronnette Juniper, MD;  Location: [place];  Service: Gastroenterology;  Laterality: N/A;  . ESOPHAGOGASTRODUODENOSCOPY (EGD) WITH PROPOFOL N/A 11/08/2017   Procedure: ESOPHAGOGASTRODUODENOSCOPY (EGD) WITH PROPOFOL Needs INR<2;  Surgeon: Ronnette Juniper, MD;  Location: [place];  Service: Gastroenterology;  Laterality: N/A;  . HERNIA REPAIR    . IR ANGIOGRAM SELECTIVE EACH ADDITIONAL VESSEL  11/10/2017  . IR ANGIOGRAM VISCERAL SELECTIVE  11/10/2017  . IR ANGIOGRAM VISCERAL SELECTIVE  11/10/2017  . IR ANGIOGRAM VISCERAL SELECTIVE  11/10/2017  . IR EMBO ART  VEN HEMORR LYMPH EXTRAV  INC GUIDE ROADMAPPING  11/10/2017  . IR US GUIDE VASC ACCESS RIGHT  11/10/2017  . POLYPECTOMY  11/08/2017   Procedure: POLYPECTOMY;  Surgeon: Ronnette Juniper, MD;  Location: [facility] ENDOSCOPY;  Service: Gastroenterology;;  . TONSILLECTOMY AND ADENOIDECTOMY  1949  . UMBILICAL HERNIA REPAIR  09/2017    Current Outpatient Medications  Medication Sig Dispense Refill  .  acetaminophen (TYLENOL) 500 MG tablet Take 1,000 mg by mouth every 6 (six) hours as needed for headache (pain).    Marland Kitchen atorvastatin (LIPITOR) 10 MG tablet Take 10 mg by mouth daily in the afternoon.     . donepezil (ARICEPT) 5 MG tablet Take 5 mg by mouth daily in the afternoon.     . escitalopram (LEXAPRO) 5 MG tablet Take 5  mg by mouth daily in the afternoon.    . finasteride (PROSCAR) 5 MG tablet Take 5 mg by mouth daily in the afternoon.     . furosemide (LASIX) 20 MG tablet Take 20 mg by mouth daily as needed for fluid or edema.     Marland Kitchen losartan (COZAAR) 25 MG tablet TAKE 1/2 TABLET BY MOUTH AT BEDTIME (Cut tabs in half) (Patient taking differently: Take 12.5 mg by mouth at bedtime. ) 45 tablet 4  . meclizine (ANTIVERT) 25 MG tablet Take 25 mg by mouth 3 (three) times daily as needed for dizziness.    . metoprolol succinate (TOPROL-XL) 100 MG 24 hr tablet TAKE 1 AND 1/2 TABLETS BY MOUTH TWICE DAILY (Patient taking differently: Take 150 mg by mouth in the morning and at bedtime. ) 270 tablet 1  . Multiple Vitamin (MULTIVITAMIN WITH MINERALS) TABS tablet Take 1 tablet by mouth daily.    . Omega-3 Fatty Acids (FISH OIL) 1000 MG CAPS Take 1,000 mg by mouth every evening.     Marland Kitchen oxymetazoline (AFRIN) 0.05 % nasal spray Place 1 spray into both nostrils daily as needed for congestion.    . pantoprazole (PROTONIX) 40 MG tablet Take 40 mg by mouth daily in the afternoon.     . phenylephrine (NEO-SYNEPHRINE) 1 % nasal spray Place 1 drop into both nostrils daily as needed for congestion.    . Tamsulosin HCl (FLOMAX) 0.4 MG CAPS Take 0.4 mg by mouth at bedtime.     Marland Kitchen warfarin (COUMADIN) 5 MG tablet Take 5 mg by mouth daily at 6 PM.      No current facility-administered medications for this visit.    Allergies:   Patient has no known allergies.   Social History:  The patient  reports that he quit smoking about 19 years ago. His smoking use included cigarettes. He has a 41.00 pack-year smoking history. He has quit using smokeless tobacco.  His smokeless tobacco use included snuff and chew. He reports current alcohol use. He reports that he does not use drugs.   Family History:  The patient's family history includes Coronary artery disease in his father.  ROS:  Please see the history of present illness.    All other systems  are reviewed and otherwise negative.   PHYSICAL EXAM:  VS:  BP 136/72   Pulse 81   Ht 5\' 8"  (1.727 m)   Wt 196 lb 6.4 oz (89.1 kg)   SpO2 96%   BMI 29.86 kg/m  BMI: Body mass index is 29.86 kg/m. Well nourished, well developed, in no acute distress HEENT: normocephalic, atraumatic Neck: no JVD, carotid bruits or masses Cardiac:  RRR; no significant murmurs, no rubs, or gallops Lungs:  CTA b/l, no wheezing, rhonchi or rales Abd: soft, nontender MS: no deformity or atrophy Ext: no edema Skin: warm and dry, no rash Neuro:  No gross deficits appreciated Psych: euthymic mood, full affect  ICD site is well healed, no erythema, edema, fluctuation, or increased heat to the surrounding tissues, no evidence of infection   EKG:  Not done today  Device interrogation done today and reviewed by myself:  Battery and lead measurements are good BP 84% Underlying is AF 60's   04/17/2019 TTE: 1. Left ventricular ejection fraction, by estimation, is 50 to 55%. The  left ventricle has low normal function. The left ventricle has no regional  wall motion abnormalities. Left ventricular diastolic parameters are  indeterminate.  2. Right ventricular systolic function is normal. The right ventricular  size is normal. There is normal pulmonary artery systolic pressure. The  estimated right ventricular systolic pressure is 00.5 mmHg.  3. Left atrial size was mildly dilated.  4. Right atrial size was upper normal.  5. The mitral valve is grossly normal. Mild to moderate mitral valve  regurgitation.  6. Tricuspid valve regurgitation is moderate.  7. The aortic valve has been repaired/replaced. There is a mechanical  prosthesis in postion with grossly normal function. Aortic valve  regurgitation is trivial. Aortic valve mean gradient measures 8.0 mmHg.  8. The inferior vena cava is normal in size with greater than 50%  respiratory variability, suggesting right atrial pressure of 3 mmHg.     Recent Labs: 11/25/2019: BUN 14; Creatinine, Ser 1.16; Hemoglobin 11.9; Platelets 159; Potassium 4.9; Sodium 141  No results found for requested labs within last 8760 hours.   CrCl cannot be calculated (Patient's most recent lab result is older than the maximum 21 days allowed.).   Wt Readings from Last 3 Encounters:  12/30/19 196 lb 6.4 oz (89.1 kg)  12/03/19 193 lb (87.5 kg)  11/25/19 196 lb 12.8 oz (89.3 kg)     Other studies reviewed: Additional studies/records reviewed today include: summarized above  ASSESSMENT AND PLAN:  1. ICD     No programming changes made  2. NICM with recovered LVEF 3. LBBB     84% BP since gen change     BP % have wavered historically in DDD programming, has been VVIR last few months     Seems previously VP rate was at 80 >> now is 75     He is on god dose of BB already     Follow up at his scheduled visit in Feb  4. Permanent AFib     CHA2DS2Vasc is 4, on warfarin      asymptomatic  5. HTN     Looks OK, no changes  6. mechanical AVR    Most recent echo above    On warfarin, monitored and managed by his PMD    Disposition: F/u with Dr. Rayann Heman and Dr. Domenic Polite as scheduled, sooner if needed.  Current medicines are reviewed at length with the patient today.  The patient did not have any concerns regarding medicines.  Venetia Night, PA-C 12/30/2019 5:15 PM     [place] [address] [phone] (office)  [phone] (fax)

## 2019-12-30 ENCOUNTER — Ambulatory Visit: Payer: Medicare Other | Admitting: Physician Assistant

## 2019-12-30 ENCOUNTER — Other Ambulatory Visit: Payer: Self-pay

## 2019-12-30 ENCOUNTER — Encounter: Payer: Self-pay | Admitting: Physician Assistant

## 2019-12-30 VITALS — BP 136/72 | HR 81 | Ht 68.0 in | Wt 196.4 lb

## 2019-12-30 DIAGNOSIS — Z5189 Encounter for other specified aftercare: Secondary | ICD-10-CM | POA: Diagnosis not present

## 2019-12-30 DIAGNOSIS — Z952 Presence of prosthetic heart valve: Secondary | ICD-10-CM

## 2019-12-30 DIAGNOSIS — Z95810 Presence of automatic (implantable) cardiac defibrillator: Secondary | ICD-10-CM

## 2019-12-30 DIAGNOSIS — I4821 Permanent atrial fibrillation: Secondary | ICD-10-CM

## 2019-12-30 DIAGNOSIS — I428 Other cardiomyopathies: Secondary | ICD-10-CM | POA: Diagnosis not present

## 2019-12-30 NOTE — Patient Instructions (Signed)
Medication Instructions:   Your physician recommends that you continue on your current medications as directed. Please refer to the Current Medication list given to you today.  *If you need a refill on your cardiac medications before your next appointment, please call your pharmacy*   Lab Work: NONE ORDERED  TODAY   If you have labs (blood work) drawn today and your tests are completely normal, you will receive your results only by: . MyChart Message (if you have MyChart) OR . A paper copy in the mail If you have any lab test that is abnormal or we need to change your treatment, we will call you to review the results.   Testing/Procedures: NONE ORDERED  TODAY    Follow-Up: At [facility], you and your health needs are our priority.  As part of our continuing mission to provide you with exceptional heart care, we have created designated Provider Care Teams.  These Care Teams include your primary Cardiologist (physician) and Advanced Practice Providers (APPs -  Physician Assistants and Nurse Practitioners) who all work together to provide you with the care you need, when you need it.  We recommend signing up for the patient portal called "MyChart".  Sign up information is provided on this After Visit Summary.  MyChart is used to connect with patients for Virtual Visits (Telemedicine).  Patients are able to view lab/test results, encounter notes, upcoming appointments, etc.  Non-urgent messages can be sent to your provider as well.   To learn more about what you can do with MyChart, go to [email or web address].    Your next appointment:  AS SCHEDULED   Other Instructions   

## 2020-01-04 DIAGNOSIS — J411 Mucopurulent chronic bronchitis: Secondary | ICD-10-CM | POA: Diagnosis not present

## 2020-01-14 DIAGNOSIS — I509 Heart failure, unspecified: Secondary | ICD-10-CM | POA: Diagnosis not present

## 2020-01-14 DIAGNOSIS — E7800 Pure hypercholesterolemia, unspecified: Secondary | ICD-10-CM | POA: Diagnosis not present

## 2020-01-17 DIAGNOSIS — Z0000 Encounter for general adult medical examination without abnormal findings: Secondary | ICD-10-CM | POA: Diagnosis not present

## 2020-01-17 DIAGNOSIS — I4891 Unspecified atrial fibrillation: Secondary | ICD-10-CM | POA: Diagnosis not present

## 2020-01-17 DIAGNOSIS — E7800 Pure hypercholesterolemia, unspecified: Secondary | ICD-10-CM | POA: Diagnosis not present

## 2020-01-17 DIAGNOSIS — Z7189 Other specified counseling: Secondary | ICD-10-CM | POA: Diagnosis not present

## 2020-01-17 DIAGNOSIS — Z79899 Other long term (current) drug therapy: Secondary | ICD-10-CM | POA: Diagnosis not present

## 2020-01-17 DIAGNOSIS — Z299 Encounter for prophylactic measures, unspecified: Secondary | ICD-10-CM | POA: Diagnosis not present

## 2020-01-20 ENCOUNTER — Ambulatory Visit (INDEPENDENT_AMBULATORY_CARE_PROVIDER_SITE_OTHER): Payer: Medicare Other

## 2020-01-20 DIAGNOSIS — I5022 Chronic systolic (congestive) heart failure: Secondary | ICD-10-CM | POA: Diagnosis not present

## 2020-01-20 DIAGNOSIS — Z95810 Presence of automatic (implantable) cardiac defibrillator: Secondary | ICD-10-CM | POA: Diagnosis not present

## 2020-01-22 ENCOUNTER — Telehealth: Payer: Self-pay

## 2020-01-22 NOTE — Progress Notes (Signed)
EPIC Encounter for ICM Monitoring  Patient Name: Timothy Fritz. is a 79 y.o. male Date: 01/22/2020 Primary Care Physican: Timothy Blitz, MD Primary Cardiologist:[place] Electrophysiologist: Allred Bi-V Pacing: 89% 9/27/2021Weight:189lbs    Attempted call to patient and unable to reach.  Transmission reviewed.   Corvue thoracic impedancesuggesting normal fluid levels.  Prescribed:Furosemide 20 mg 1 tablet dailyas needed.  Labs: 09/06/2019 [name] [id number], [id number] Care Everywhere 09/05/2019 Creatinine1.21, BUN17, Potassium3.9, [id number], [id number] Care Everywhere A complete set of results can be found in Results Review.  Recommendations:Unable to reach.    Follow-up plan: ICM clinic phone appointment on1/12/2020.91 day device clinic remote transmission1/19/2022.   EP/Cardiology Office Visits:03/20/2020 with Dr Rayann Heman.  3/23/2022with Dr. Domenic Polite.  Copy of ICM check sent to Dr.Allred.   3 month ICM trend: 01/20/2020    1 Year ICM trend:       [name], RN 01/22/2020 12:53 PM

## 2020-01-22 NOTE — Telephone Encounter (Signed)
Remote ICM transmission received.  Attempted call to patient regarding ICM remote transmission and recording stated not accepting messages.

## 2020-02-03 DIAGNOSIS — J471 Bronchiectasis with (acute) exacerbation: Secondary | ICD-10-CM | POA: Diagnosis not present

## 2020-02-05 ENCOUNTER — Other Ambulatory Visit: Payer: Self-pay

## 2020-02-05 ENCOUNTER — Encounter: Payer: Self-pay | Admitting: Urology

## 2020-02-05 ENCOUNTER — Ambulatory Visit (INDEPENDENT_AMBULATORY_CARE_PROVIDER_SITE_OTHER): Payer: Medicare Other | Admitting: Urology

## 2020-02-05 VITALS — BP 148/94 | HR 81 | Temp 97.8°F | Wt 202.0 lb

## 2020-02-05 DIAGNOSIS — N5089 Other specified disorders of the male genital organs: Secondary | ICD-10-CM

## 2020-02-05 LAB — URINALYSIS, ROUTINE W REFLEX MICROSCOPIC
Bilirubin, UA: NEGATIVE
Glucose, UA: NEGATIVE
Ketones, UA: NEGATIVE
Leukocytes,UA: NEGATIVE
Nitrite, UA: NEGATIVE
Protein,UA: NEGATIVE
RBC, UA: NEGATIVE
Specific Gravity, UA: >1.03 — ABNORMAL HIGH (ref 1.005–1.030)
Urobilinogen, Ur: 0.2 mg/dL (ref 0.2–1.0)
pH, UA: 5 (ref 5.0–7.5)

## 2020-02-05 NOTE — Progress Notes (Signed)
Urological Symptom Review  Patient is experiencing the following symptoms: Get up at night to urinate Erection problems (male only)   Review of Systems  Gastrointestinal (upper)  : Negative for upper GI symptoms  Gastrointestinal (lower) : Negative for lower GI symptoms  Constitutional : Negative for symptoms  Skin: Negative for skin symptoms  Eyes: Negative for eye symptoms  Ear/Nose/Throat : Negative for Ear/Nose/Throat symptoms  Hematologic/Lymphatic: Easy bruising  Cardiovascular : Leg swelling  Respiratory : Negative   Endocrine: Negative for endocrine symptoms  Musculoskeletal: Negative for musculoskeletal symptoms  Neurological: Negative for neurological symptoms  Psychologic: Negative for psychiatric symptoms

## 2020-02-05 NOTE — Progress Notes (Signed)
02/05/2020 2:19 PM   [name] Feb 14, 1941 [id number]  Referring provider: [name], MD [address] [place],  [place] [address]  Left scrotal mass  HPI: Mr Timothy Fritz is a 79yo here for evaluation of new onset left scrotal swelling with associated pain. Starting 3-4 week ago he developed dull left scrotal pain which has intensified over the past 2 weeks. He has noted new painful lesions on his scrotum which has been present for the past 2 weeks. No trauma. No unexplained weight loss. NO recent abdominal imaging. No other associated symptoms.   PMH: Past Medical History:  Diagnosis Date  . AICD (automatic cardioverter/defibrillator) present 07/11/2012  . Aortic aneurysm, thoracic ([place])    Fusiform s/p Bentall procedure 2002  . Aortic valve, bicuspid    Mechanical AVR  . Basal cell carcinoma of face   . BPH (benign prostatic hypertrophy)   . Coronary atherosclerosis of native coronary artery    Nonobstructive at cardiac catherization 2002  . Essential hypertension   . Left bundle branch block   . Mixed hyperlipidemia   . Nonischemic cardiomyopathy (HCC)    CRT-D  . Persistent atrial fibrillation [facility])     Surgical History: Past Surgical History:  Procedure Laterality Date  . BENTALL PROCEDURE  2002   Dr Cyndia Bent   . BI-VENTRICULAR IMPLANTABLE CARDIOVERTER DEFIBRILLATOR N/A 07/12/2012   Procedure: BI-VENTRICULAR IMPLANTABLE CARDIOVERTER DEFIBRILLATOR  (CRT-D);  Surgeon: [name], MD;  Location: [facility] CATH LAB;  Service: Cardiovascular;  Laterality: N/A;  . BI-VENTRICULAR IMPLANTABLE CARDIOVERTER DEFIBRILLATOR  (CRT-D)  07/11/2012   [facility]- Dr. Rayann Heman  . BIV ICD GENERATOR CHANGEOUT N/A 12/03/2019   Procedure: BIV ICD GENERATOR CHANGEOUT;  Surgeon: [name], MD;  Location: [place] CV LAB;  Service: Cardiovascular;  Laterality: N/A;  . CARDIAC CATHETERIZATION  2002  . CARDIAC VALVE REPLACEMENT     AVR  . CATARACT EXTRACTION Left 01/21/13   . COLONOSCOPY N/A 12/01/2016   Procedure: COLONOSCOPY;  Surgeon: Rogene Houston, MD;  [facility];  Service: Endoscopy;  Laterality: N/A;  2:00  . COLONOSCOPY WITH PROPOFOL N/A 11/08/2017   Procedure: COLONOSCOPY WITH PROPOFOL;  Surgeon: Ronnette Juniper, MD;  Location: [place];  Service: Gastroenterology;  Laterality: N/A;  . ESOPHAGOGASTRODUODENOSCOPY (EGD) WITH PROPOFOL N/A 11/08/2017   Procedure: ESOPHAGOGASTRODUODENOSCOPY (EGD) WITH PROPOFOL Needs INR<2;  Surgeon: Ronnette Juniper, MD;  Location: [place];  Service: Gastroenterology;  Laterality: N/A;  . HERNIA REPAIR    . IR ANGIOGRAM SELECTIVE EACH ADDITIONAL VESSEL  11/10/2017  . IR ANGIOGRAM VISCERAL SELECTIVE  11/10/2017  . IR ANGIOGRAM VISCERAL SELECTIVE  11/10/2017  . IR ANGIOGRAM VISCERAL SELECTIVE  11/10/2017  . IR EMBO ART  VEN HEMORR LYMPH EXTRAV  INC GUIDE ROADMAPPING  11/10/2017  . IR US GUIDE VASC ACCESS RIGHT  11/10/2017  . POLYPECTOMY  11/08/2017   Procedure: POLYPECTOMY;  Surgeon: Ronnette Juniper, MD;  Location: [facility] ENDOSCOPY;  Service: Gastroenterology;;  . TONSILLECTOMY AND ADENOIDECTOMY  1949  . UMBILICAL HERNIA REPAIR  09/2017    Home Medications:  Allergies as of 02/05/2020   No Known Allergies     Medication List       Accurate as of February 05, 2020  2:19 PM. If you have any questions, ask your nurse or doctor.        STOP taking these medications   oxymetazoline 0.05 % nasal spray Commonly known as: AFRIN Stopped by: Nicolette Bang, MD   phenylephrine 1 % nasal  spray Commonly known as: NEO-SYNEPHRINE Stopped by: Nicolette Bang, MD     TAKE these medications   acetaminophen 500 MG tablet Commonly known as: TYLENOL Take 1,000 mg by mouth every 6 (six) hours as needed for headache (pain).   atorvastatin 10 MG tablet Commonly known as: LIPITOR Take 10 mg by mouth daily in the afternoon.   donepezil 5 MG tablet Commonly known as: ARICEPT Take 5 mg by mouth daily in the afternoon.    escitalopram 5 MG tablet Commonly known as: LEXAPRO Take 5 mg by mouth daily in the afternoon.   finasteride 5 MG tablet Commonly known as: PROSCAR Take 5 mg by mouth daily in the afternoon.   Fish Oil 1000 MG Caps Take 1,000 mg by mouth every evening.   furosemide 20 MG tablet Commonly known as: LASIX Take 20 mg by mouth daily as needed for fluid or edema.   losartan 25 MG tablet Commonly known as: COZAAR TAKE 1/2 TABLET BY MOUTH AT BEDTIME (Cut tabs in half) What changed: See the new instructions.   meclizine 25 MG tablet Commonly known as: ANTIVERT Take 25 mg by mouth 3 (three) times daily as needed for dizziness.   metoprolol succinate 100 MG 24 hr tablet Commonly known as: TOPROL-XL TAKE 1 AND 1/2 TABLETS BY MOUTH TWICE DAILY What changed: when to take this   multivitamin with minerals Tabs tablet Take 1 tablet by mouth daily.   pantoprazole 40 MG tablet Commonly known as: PROTONIX Take 40 mg by mouth daily in the afternoon.   tamsulosin 0.4 MG Caps capsule Commonly known as: FLOMAX Take 0.4 mg by mouth at bedtime.   warfarin 5 MG tablet Commonly known as: COUMADIN Take 5 mg by mouth daily at 6 PM.       Allergies: No Known Allergies  Family History: Family History  Problem Relation Age of Onset  . Coronary artery disease Father     Social History:  reports that he quit smoking about 19 years ago. His smoking use included cigarettes. He has a 41.00 pack-year smoking history. He has quit using smokeless tobacco.  His smokeless tobacco use included snuff and chew. He reports current alcohol use. He reports that he does not use drugs.  ROS: All other review of systems were reviewed and are negative except what is noted above in HPI  Physical Exam: BP (!) 148/94   Pulse 81   Temp 97.8 F (36.6 C)   Wt 202 lb (91.6 kg)   BMI 30.71 kg/m   Constitutional:  Alert and oriented, No acute distress. HEENT: [place] AT, moist mucus membranes.  Trachea midline,  no masses. Cardiovascular: No clubbing, cyanosis, or edema. Respiratory: Normal respiratory effort, no increased work of breathing. GI: Abdomen is soft, nontender, nondistended, no abdominal masses GU: No CVA tenderness. Circumcised phallus. No masses/lesions on penis, testis. Numerous tortuous varicose veins superficial on scrotal wall. Grade 3 varicocele on the left. Lymph: No cervical or inguinal lymphadenopathy. Skin: No rashes, bruises or suspicious lesions. Neurologic: Grossly intact, no focal deficits, moving all 4 extremities. Psychiatric: Normal mood and affect.  Laboratory Data: Lab Results  Component Value Date   WBC 6.5 11/25/2019   HGB 11.9 (L) 11/25/2019   HCT 39.3 11/25/2019   MCV 80 11/25/2019   PLT 159 11/25/2019    Lab Results  Component Value Date   CREATININE 1.16 11/25/2019    No results found for: PSA  No results found for: TESTOSTERONE  No results found for: HGBA1C  Urinalysis    Component Value Date/Time   COLORURINE RED (A) 07/15/2019 1206   APPEARANCEUR TURBID (A) 07/15/2019 1206   LABSPEC  07/15/2019 1206    TEST NOT REPORTED DUE TO COLOR INTERFERENCE OF URINE PIGMENT   PHURINE  07/15/2019 1206    TEST NOT REPORTED DUE TO COLOR INTERFERENCE OF URINE PIGMENT   GLUCOSEU (A) 07/15/2019 1206    TEST NOT REPORTED DUE TO COLOR INTERFERENCE OF URINE PIGMENT   HGBUR (A) 07/15/2019 1206    TEST NOT REPORTED DUE TO COLOR INTERFERENCE OF URINE PIGMENT   BILIRUBINUR (A) 07/15/2019 1206    TEST NOT REPORTED DUE TO COLOR INTERFERENCE OF URINE PIGMENT   KETONESUR (A) 07/15/2019 1206    TEST NOT REPORTED DUE TO COLOR INTERFERENCE OF URINE PIGMENT   PROTEINUR (A) 07/15/2019 1206    TEST NOT REPORTED DUE TO COLOR INTERFERENCE OF URINE PIGMENT   UROBILINOGEN 1.0 04/17/2012 1751   NITRITE (A) 07/15/2019 1206    TEST NOT REPORTED DUE TO COLOR INTERFERENCE OF URINE PIGMENT   LEUKOCYTESUR (A) 07/15/2019 1206    TEST NOT REPORTED DUE TO COLOR INTERFERENCE OF  URINE PIGMENT    Lab Results  Component Value Date   BACTERIA MANY (A) 07/15/2019    Pertinent Imaging:  No results found for this or any previous visit.  No results found for this or any previous visit.  No results found for this or any previous visit.  No results found for this or any previous visit.  No results found for this or any previous visit.  No results found for this or any previous visit.  No results found for this or any previous visit.  No results found for this or any previous visit.   Assessment & Plan:    1. Scrotal mass -BMP -CT abd/pelvis w/wo contrast -RTC 3 weeks - Urinalysis, Routine w reflex microscopic - Basic metabolic panel; Future - CT Abdomen Pelvis W Wo Contrast; Future   No follow-ups on file.  Nicolette Bang, MD  [facility] [place]

## 2020-02-06 DIAGNOSIS — N2889 Other specified disorders of kidney and ureter: Secondary | ICD-10-CM | POA: Insufficient documentation

## 2020-02-06 LAB — BASIC METABOLIC PANEL
BUN/Creatinine Ratio: 18 (ref 10–24)
BUN: 19 mg/dL (ref 8–27)
CO2: 24 mmol/L (ref 20–29)
Calcium: 8.8 mg/dL (ref 8.6–10.2)
Chloride: 105 mmol/L (ref 96–106)
Creatinine, Ser: 1.04 mg/dL (ref 0.76–1.27)
GFR calc Af Amer: 79 mL/min/{1.73_m2} (ref >59)
GFR calc non Af Amer: 68 mL/min/{1.73_m2} (ref >59)
Glucose: 95 mg/dL (ref 65–99)
Potassium: 4.6 mmol/L (ref 3.5–5.2)
Sodium: 141 mmol/L (ref 134–144)

## 2020-02-11 ENCOUNTER — Encounter: Payer: Self-pay | Admitting: Urology

## 2020-02-11 NOTE — Patient Instructions (Signed)
Varicocele  A varicocele is a swelling of veins in the scrotum. The scrotum is the sac that contains the testicles. Varicoceles can occur on either side of the scrotum, but they are more common on the left side. They occur most often in teenage boys and young men. In most cases, varicoceles are not a serious problem. They are usually small and painless and do not require treatment. Tests may be done to confirm the diagnosis. Treatment may be needed if:  A varicocele is large, causes a lot of pain, or causes pain when exercising.  Varicoceles are found on both sides of the scrotum.  A varicocele causes a decrease in the size of the testicle in a growing adolescent.  The person has fertility problems. What are the causes? This condition is the result of valves in the veins not working properly. Valves in the veins help to return blood from the scrotum and testicles to the heart. If these valves do not work well, blood flows backward and backs up into the veins, which causes the veins to swell. This is similar to what happens when varicose veins form in the leg. What are the signs or symptoms? Most varicoceles do not cause any symptoms. If symptoms do occur, they may include:  Swelling on one side of the scrotum. The swelling may be more obvious when you are standing up.  A lumpy feeling in the scrotum.  A heavy feeling on one side of the scrotum.  A dull ache in the scrotum, especially after exercise or prolonged standing or sitting.  Slower growth or reduced size of the testicle on the side of the varicocele (in young males).  Problems with fathering a child (fertility). This can occur if the testicle does not grow normally or if the condition causes problems with the sperm, such as a low sperm count or sperm that are not able to reach the egg (poor motility). How is this diagnosed? This condition is diagnosed based on:  Your medical history.  A physical exam. Your health care  provider may inspect and feel (palpate) the scrotal area to check for swollen or enlarged veins.  An ultrasound. This may be done to confirm the diagnosis and to help rule out other causes of the swelling. How is this treated? Treatment is usually not needed for this condition. If you have any pain, your health care provider may prescribe or recommend medicine to help relieve it. You may need regular exams so your health care provider can monitor the varicocele to ensure that it does not cause problems. When further treatment is needed, it may involve one of these options:  Varicocelectomy. This is a surgery in which the swollen veins are tied off so that the flow of blood goes to other veins instead.  Embolization. In this procedure, a small, thin tube (catheter) is used to place metal coils or other blocking items in the veins. This cuts off the blood flow to the swollen veins. Follow these instructions at home:  Take over-the-counter and prescription medicines only as told by your health care provider.  Wear supportive underwear.  Use an athletic supporter when participating in sports activities.  Keep all follow-up visits as told by your health care provider. This is important. Contact a health care provider if:  Your pain is increasing.  You have redness in the affected area.  Your testicle becomes enlarged, swollen, or painful.  You have swelling that does not decrease when you are lying down.    One of your testicles is smaller than the other. Get help right away if:  You develop swelling in your legs.  You have difficulty breathing. Summary  Varicocele is a condition in which the veins in the scrotum are swollen or enlarged.  In most cases, varicoceles do not require treatment.  Treatment may be needed if you have pain, have problems with infertility, or have a smaller testicle associated with the varicocele.  In some cases, the condition may be treated with a  procedure to cut off the flow of blood to the swollen veins. This information is not intended to replace advice given to you by your health care provider. Make sure you discuss any questions you have with your health care provider. Document Revised: 04/20/2018 Document Reviewed: 04/20/2018 Elsevier Patient Education  2020 Elsevier Inc.  

## 2020-02-13 DIAGNOSIS — E7800 Pure hypercholesterolemia, unspecified: Secondary | ICD-10-CM | POA: Diagnosis not present

## 2020-02-13 DIAGNOSIS — I509 Heart failure, unspecified: Secondary | ICD-10-CM | POA: Diagnosis not present

## 2020-02-21 DIAGNOSIS — Z6831 Body mass index (BMI) 31.0-31.9, adult: Secondary | ICD-10-CM | POA: Diagnosis not present

## 2020-02-21 DIAGNOSIS — R69 Illness, unspecified: Secondary | ICD-10-CM | POA: Diagnosis not present

## 2020-02-21 DIAGNOSIS — G309 Alzheimer's disease, unspecified: Secondary | ICD-10-CM | POA: Diagnosis not present

## 2020-02-21 DIAGNOSIS — Z87891 Personal history of nicotine dependence: Secondary | ICD-10-CM | POA: Diagnosis not present

## 2020-02-21 DIAGNOSIS — I4891 Unspecified atrial fibrillation: Secondary | ICD-10-CM | POA: Diagnosis not present

## 2020-02-21 DIAGNOSIS — Z299 Encounter for prophylactic measures, unspecified: Secondary | ICD-10-CM | POA: Diagnosis not present

## 2020-02-21 DIAGNOSIS — I10 Essential (primary) hypertension: Secondary | ICD-10-CM | POA: Diagnosis not present

## 2020-02-24 ENCOUNTER — Telehealth: Payer: Self-pay | Admitting: Urology

## 2020-02-24 NOTE — Telephone Encounter (Signed)
Spoke with patients daughter this morning to let her know his insurance denied his CT auth request. They asked that the provider do a MRI or [place] instead. I sent Dr Alyson Ingles a note to let him know and see how to proceed.

## 2020-02-25 NOTE — Progress Notes (Signed)
No ICM remote transmission received for 02/25/2020 and next ICM transmission scheduled for 03/30/2020.    

## 2020-02-26 ENCOUNTER — Ambulatory Visit (HOSPITAL_COMMUNITY): Payer: Medicare HMO

## 2020-02-28 ENCOUNTER — Other Ambulatory Visit: Payer: Self-pay | Admitting: Urology

## 2020-02-28 ENCOUNTER — Ambulatory Visit: Payer: Medicare HMO | Admitting: Urology

## 2020-02-28 ENCOUNTER — Ambulatory Visit: Payer: Medicare Other | Admitting: Urology

## 2020-02-28 DIAGNOSIS — N2889 Other specified disorders of kidney and ureter: Secondary | ICD-10-CM

## 2020-03-04 ENCOUNTER — Ambulatory Visit (INDEPENDENT_AMBULATORY_CARE_PROVIDER_SITE_OTHER): Payer: Medicare HMO

## 2020-03-04 DIAGNOSIS — I5022 Chronic systolic (congestive) heart failure: Secondary | ICD-10-CM | POA: Diagnosis not present

## 2020-03-05 LAB — CUP PACEART REMOTE DEVICE CHECK
Battery Remaining Longevity: 83 mo
Battery Remaining Percentage: 93 %
Battery Voltage: 3.02 V
Date Time Interrogation Session: 20220119021519
HighPow Impedance: 60 Ohm
Implantable Lead Implant Date: 20140529
Implantable Lead Implant Date: 20140529
Implantable Lead Implant Date: 20140529
Implantable Lead Location: 753858
Implantable Lead Location: 753859
Implantable Lead Location: 753860
Implantable Pulse Generator Implant Date: 20211019
Lead Channel Impedance Value: 330 Ohm
Lead Channel Impedance Value: 390 Ohm
Lead Channel Impedance Value: 660 Ohm
Lead Channel Pacing Threshold Amplitude: 1 V
Lead Channel Pacing Threshold Amplitude: 1 V
Lead Channel Pacing Threshold Pulse Width: 0.5 ms
Lead Channel Pacing Threshold Pulse Width: 0.5 ms
Lead Channel Sensing Intrinsic Amplitude: 0.8 mV
Lead Channel Sensing Intrinsic Amplitude: 12 mV
Lead Channel Setting Pacing Amplitude: 2 V
Lead Channel Setting Pacing Amplitude: 2 V
Lead Channel Setting Pacing Pulse Width: 0.5 ms
Lead Channel Setting Pacing Pulse Width: 0.5 ms
Lead Channel Setting Sensing Sensitivity: 0.5 mV
Pulse Gen Serial Number: 810006284

## 2020-03-13 ENCOUNTER — Other Ambulatory Visit: Payer: Self-pay | Admitting: Cardiology

## 2020-03-16 NOTE — Progress Notes (Signed)
Remote pacemaker transmission.   

## 2020-03-20 ENCOUNTER — Encounter: Payer: Self-pay | Admitting: Internal Medicine

## 2020-03-20 ENCOUNTER — Ambulatory Visit (INDEPENDENT_AMBULATORY_CARE_PROVIDER_SITE_OTHER): Payer: Medicare HMO | Admitting: Internal Medicine

## 2020-03-20 VITALS — BP 138/80 | HR 79 | Ht 68.0 in | Wt 200.0 lb

## 2020-03-20 DIAGNOSIS — I428 Other cardiomyopathies: Secondary | ICD-10-CM

## 2020-03-20 DIAGNOSIS — D6869 Other thrombophilia: Secondary | ICD-10-CM

## 2020-03-20 DIAGNOSIS — I4819 Other persistent atrial fibrillation: Secondary | ICD-10-CM

## 2020-03-20 DIAGNOSIS — Z95810 Presence of automatic (implantable) cardiac defibrillator: Secondary | ICD-10-CM

## 2020-03-20 DIAGNOSIS — I10 Essential (primary) hypertension: Secondary | ICD-10-CM | POA: Diagnosis not present

## 2020-03-20 LAB — CUP PACEART INCLINIC DEVICE CHECK
Battery Remaining Longevity: 81 mo
Brady Statistic RA Percent Paced: 0 %
Brady Statistic RV Percent Paced: 92 %
Date Time Interrogation Session: 20220204123554
HighPow Impedance: 61.875
Implantable Lead Implant Date: 20140529
Implantable Lead Implant Date: 20140529
Implantable Lead Implant Date: 20140529
Implantable Lead Location: 753858
Implantable Lead Location: 753859
Implantable Lead Location: 753860
Implantable Pulse Generator Implant Date: 20211019
Lead Channel Impedance Value: 375 Ohm
Lead Channel Impedance Value: 400 Ohm
Lead Channel Impedance Value: 650 Ohm
Lead Channel Pacing Threshold Amplitude: 1 V
Lead Channel Pacing Threshold Amplitude: 1 V
Lead Channel Pacing Threshold Pulse Width: 0.5 ms
Lead Channel Pacing Threshold Pulse Width: 0.5 ms
Lead Channel Sensing Intrinsic Amplitude: 1.5 mV
Lead Channel Sensing Intrinsic Amplitude: 12 mV
Lead Channel Setting Pacing Amplitude: 2 V
Lead Channel Setting Pacing Amplitude: 2 V
Lead Channel Setting Pacing Pulse Width: 0.5 ms
Lead Channel Setting Pacing Pulse Width: 0.5 ms
Lead Channel Setting Sensing Sensitivity: 0.5 mV
Pulse Gen Serial Number: 810006284

## 2020-03-20 NOTE — Patient Instructions (Addendum)
Medication Instructions:  Continue all current medications.  Labwork: none  Testing/Procedures: none  Follow-Up: 1 year   Any Other Special Instructions Will Be Listed Below (If Applicable).  If you need a refill on your cardiac medications before your next appointment, please call your pharmacy.  

## 2020-03-20 NOTE — Progress Notes (Signed)
PCP: [name], MD   Primary EP: Dr Rayann Heman  Timothy Fowler Sr. is a 80 y.o. male who presents today for routine electrophysiology followup.  Since last being seen in our clinic, the patient reports doing very well.  He has developed some memory deficits over the past 6 months.  He is following with Dr Manuella Ghazi for this.  Today, he denies symptoms of palpitations, chest pain, shortness of breath,  lower extremity edema, dizziness, presyncope, syncope, or ICD shocks.  The patient is otherwise without complaint today.   Past Medical History:  Diagnosis Date  . AICD (automatic cardioverter/defibrillator) present 07/11/2012  . Aortic aneurysm, thoracic ([place])    Fusiform s/p Bentall procedure 2002  . Aortic valve, bicuspid    Mechanical AVR  . Basal cell carcinoma of face   . BPH (benign prostatic hypertrophy)   . Coronary atherosclerosis of native coronary artery    Nonobstructive at cardiac catherization 2002  . Essential hypertension   . Left bundle branch block   . Mixed hyperlipidemia   . Nonischemic cardiomyopathy (HCC)    CRT-D  . Persistent atrial fibrillation [facility])    Past Surgical History:  Procedure Laterality Date  . BENTALL PROCEDURE  2002   Dr Cyndia Bent   . BI-VENTRICULAR IMPLANTABLE CARDIOVERTER DEFIBRILLATOR N/A 07/12/2012   Procedure: BI-VENTRICULAR IMPLANTABLE CARDIOVERTER DEFIBRILLATOR  (CRT-D);  Surgeon: [name], MD;  Location: [facility] CATH LAB;  Service: Cardiovascular;  Laterality: N/A;  . BI-VENTRICULAR IMPLANTABLE CARDIOVERTER DEFIBRILLATOR  (CRT-D)  07/11/2012   [facility]- Dr. Rayann Heman  . BIV ICD GENERATOR CHANGEOUT N/A 12/03/2019   Procedure: BIV ICD GENERATOR CHANGEOUT;  Surgeon: [name], MD;  Location: [place] CV LAB;  Service: Cardiovascular;  Laterality: N/A;  . CARDIAC CATHETERIZATION  2002  . CARDIAC VALVE REPLACEMENT     AVR  . CATARACT EXTRACTION Left 01/21/13  . COLONOSCOPY N/A 12/01/2016   Procedure: COLONOSCOPY;   Surgeon: Rogene Houston, MD;  [facility];  Service: Endoscopy;  Laterality: N/A;  2:00  . COLONOSCOPY WITH PROPOFOL N/A 11/08/2017   Procedure: COLONOSCOPY WITH PROPOFOL;  Surgeon: Ronnette Juniper, MD;  Location: [place];  Service: Gastroenterology;  Laterality: N/A;  . ESOPHAGOGASTRODUODENOSCOPY (EGD) WITH PROPOFOL N/A 11/08/2017   Procedure: ESOPHAGOGASTRODUODENOSCOPY (EGD) WITH PROPOFOL Needs INR<2;  Surgeon: Ronnette Juniper, MD;  Location: [place];  Service: Gastroenterology;  Laterality: N/A;  . HERNIA REPAIR    . IR ANGIOGRAM SELECTIVE EACH ADDITIONAL VESSEL  11/10/2017  . IR ANGIOGRAM VISCERAL SELECTIVE  11/10/2017  . IR ANGIOGRAM VISCERAL SELECTIVE  11/10/2017  . IR ANGIOGRAM VISCERAL SELECTIVE  11/10/2017  . IR EMBO ART  VEN HEMORR LYMPH EXTRAV  INC GUIDE ROADMAPPING  11/10/2017  . IR US GUIDE VASC ACCESS RIGHT  11/10/2017  . POLYPECTOMY  11/08/2017   Procedure: POLYPECTOMY;  Surgeon: Ronnette Juniper, MD;  [facility] ENDOSCOPY;  Service: Gastroenterology;;  . TONSILLECTOMY AND ADENOIDECTOMY  1949  . UMBILICAL HERNIA REPAIR  09/2017    ROS- all systems are reviewed and negative except as per HPI above  Current Outpatient Medications  Medication Sig Dispense Refill  . acetaminophen (TYLENOL) 500 MG tablet Take 1,000 mg by mouth every 6 (six) hours as needed for headache (pain).    Marland Kitchen atorvastatin (LIPITOR) 10 MG tablet Take 10 mg by mouth daily in the afternoon.     . donepezil (ARICEPT) 5 MG tablet Take 5 mg by mouth daily in the afternoon.     . escitalopram (  LEXAPRO) 5 MG tablet Take 5 mg by mouth daily in the afternoon.    . finasteride (PROSCAR) 5 MG tablet Take 5 mg by mouth daily in the afternoon.     . furosemide (LASIX) 20 MG tablet Take 20 mg by mouth daily as needed for fluid or edema.     Marland Kitchen losartan (COZAAR) 25 MG tablet TAKE 1/2 TABLET BY MOUTH AT BEDTIME (Cut tabs in half) (Patient taking differently: Take 12.5 mg by mouth at bedtime.) 45 tablet 4  . meclizine  (ANTIVERT) 25 MG tablet Take 25 mg by mouth 3 (three) times daily as needed for dizziness.    . metoprolol succinate (TOPROL-XL) 100 MG 24 hr tablet Take 1.5 tablets (150 mg total) by mouth in the morning and at bedtime. 270 tablet 1  . Multiple Vitamin (MULTIVITAMIN WITH MINERALS) TABS tablet Take 1 tablet by mouth daily.    . Omega-3 Fatty Acids (FISH OIL) 1000 MG CAPS Take 1,000 mg by mouth every evening.    . pantoprazole (PROTONIX) 40 MG tablet Take 40 mg by mouth daily in the afternoon.     . tamsulosin (FLOMAX) 0.4 MG CAPS capsule Take 0.4 mg by mouth at bedtime.    Marland Kitchen warfarin (COUMADIN) 5 MG tablet Take 5 mg by mouth daily at 6 PM.      No current facility-administered medications for this visit.    Physical Exam: Vitals:   03/20/20 1218  BP: 138/80  Pulse: 79  SpO2: 97%  Weight: 200 lb (90.7 kg)  Height: 5\' 8"  (1.727 m)    GEN- The patient is well appearing, alert and oriented x 3 today.   Head- normocephalic, atraumatic Eyes-  Sclera clear, conjunctiva pink Ears- hearing intact Oropharynx- clear Lungs-   normal work of breathing Chest- ICD pocket is well healed Heart- Regular rate and rhythm (V paced) GI- soft  Extremities- no clubbing, cyanosis, or edema  ICD interrogation- reviewed in detail today,  See PACEART report  ekg tracing ordered today is personally reviewed and shows afib, V paced  Wt Readings from Last 3 Encounters:  03/20/20 200 lb (90.7 kg)  02/05/20 202 lb (91.6 kg)  12/30/19 196 lb 6.4 oz (89.1 kg)    Assessment and Plan:  1.  Chronic systolic dysfunction/ nonischemic CM euvolemic today Stable on an appropriate medical regimen Normal ICD function See Pace Art report No changes today he is not device dependant today followed in ICM device clinic  2. Permanent afib Rate controlled 92 % BiV paced Not a candidate for PVI Could consider AV nodal ablation  3. Mechanical AVR/ Bentall procedure On coumadin  4. HTN Stable No change  required today   Risks, benefits and potential toxicities for medications prescribed and/or refilled reviewed with patient today.   Return in a year  [name] MD, [facility] 03/20/2020 12:44 PM

## 2020-03-23 ENCOUNTER — Encounter (HOSPITAL_COMMUNITY): Payer: Self-pay

## 2020-03-23 ENCOUNTER — Ambulatory Visit (HOSPITAL_COMMUNITY)
Admission: RE | Admit: 2020-03-23 | Discharge: 2020-03-23 | Disposition: A | Discharge status: Home / Self Care | Payer: Medicare HMO | Source: Ambulatory Visit | Attending: Urology | Admitting: Urology

## 2020-03-23 ENCOUNTER — Other Ambulatory Visit: Payer: Self-pay

## 2020-03-23 DIAGNOSIS — Z9049 Acquired absence of other specified parts of digestive tract: Secondary | ICD-10-CM | POA: Diagnosis not present

## 2020-03-23 DIAGNOSIS — N2889 Other specified disorders of kidney and ureter: Secondary | ICD-10-CM

## 2020-03-23 DIAGNOSIS — N281 Cyst of kidney, acquired: Secondary | ICD-10-CM | POA: Diagnosis not present

## 2020-03-23 DIAGNOSIS — K5730 Diverticulosis of large intestine without perforation or abscess without bleeding: Secondary | ICD-10-CM | POA: Diagnosis not present

## 2020-03-23 MED ORDER — GADOBUTROL 1 MMOL/ML IV SOLN
9.0000 mL | Freq: Once | INTRAVENOUS | Status: AC | PRN
  Administered 2020-03-23: 9 mL via INTRAVENOUS

## 2020-03-23 NOTE — Progress Notes (Signed)
Per [facility] device rep and [place] PA, changed patient's device to VOO 90 during MRI scan with tachy therapies off. Monitored patient during scan without any issues. Set patient's device back to pre MRI settings. Nothing further needed at this time.

## 2020-03-24 DIAGNOSIS — I509 Heart failure, unspecified: Secondary | ICD-10-CM | POA: Diagnosis not present

## 2020-03-24 DIAGNOSIS — Z299 Encounter for prophylactic measures, unspecified: Secondary | ICD-10-CM | POA: Diagnosis not present

## 2020-03-24 DIAGNOSIS — Z6831 Body mass index (BMI) 31.0-31.9, adult: Secondary | ICD-10-CM | POA: Diagnosis not present

## 2020-03-24 DIAGNOSIS — I4891 Unspecified atrial fibrillation: Secondary | ICD-10-CM | POA: Diagnosis not present

## 2020-03-24 DIAGNOSIS — I10 Essential (primary) hypertension: Secondary | ICD-10-CM | POA: Diagnosis not present

## 2020-03-24 DIAGNOSIS — Z87891 Personal history of nicotine dependence: Secondary | ICD-10-CM | POA: Diagnosis not present

## 2020-03-30 ENCOUNTER — Ambulatory Visit (INDEPENDENT_AMBULATORY_CARE_PROVIDER_SITE_OTHER): Payer: Medicare HMO

## 2020-03-30 DIAGNOSIS — Z95810 Presence of automatic (implantable) cardiac defibrillator: Secondary | ICD-10-CM | POA: Diagnosis not present

## 2020-03-30 DIAGNOSIS — I5022 Chronic systolic (congestive) heart failure: Secondary | ICD-10-CM

## 2020-04-07 NOTE — Progress Notes (Signed)
EPIC Encounter for ICM Monitoring  Patient Name: Timothy Fritz. is a 80 y.o. male Date: 04/07/2020 Primary Care Physican: [name], MD Primary Cardiologist:McDowell Electrophysiologist: Allred Bi-V Pacing: 96% 03/20/2020 Office Weight:200lbs  AT/AF >99%    Transmission reviewed.  Corvue thoracic impedancesuggesting normal fluid levels.  Prescribed:Furosemide 20 mg 1 tablet dailyas needed.  Labs: 02/05/2020 Creatinine 1.04, BUN 19, Potassium 4.6, Sodium 141, GFR 68-79  11/25/2019 Creatinine 1.16, BUN 14, Potassium 4.9, Sodium 141, GFR 60-69  A complete set of results can be found in Results Review.  Recommendations:  No changes.  Follow-up plan: ICM clinic phone appointment on3/21/2022.91 day device clinic remote transmission4/20/2022.   EP/Cardiology Office Visits:  3/23/2022with Dr. Domenic Polite.  Copy of ICM check sent to Dr.Allred.  3 month ICM trend: 04/02/2020.    1 Year ICM trend:       [name], RN 04/07/2020 3:13 PM

## 2020-04-24 DIAGNOSIS — Z6831 Body mass index (BMI) 31.0-31.9, adult: Secondary | ICD-10-CM | POA: Diagnosis not present

## 2020-04-24 DIAGNOSIS — I4891 Unspecified atrial fibrillation: Secondary | ICD-10-CM | POA: Diagnosis not present

## 2020-04-24 DIAGNOSIS — Z713 Dietary counseling and surveillance: Secondary | ICD-10-CM | POA: Diagnosis not present

## 2020-04-24 DIAGNOSIS — I5022 Chronic systolic (congestive) heart failure: Secondary | ICD-10-CM | POA: Diagnosis not present

## 2020-04-24 DIAGNOSIS — Z299 Encounter for prophylactic measures, unspecified: Secondary | ICD-10-CM | POA: Diagnosis not present

## 2020-05-04 ENCOUNTER — Ambulatory Visit (INDEPENDENT_AMBULATORY_CARE_PROVIDER_SITE_OTHER): Payer: Medicare HMO

## 2020-05-04 DIAGNOSIS — Z95810 Presence of automatic (implantable) cardiac defibrillator: Secondary | ICD-10-CM

## 2020-05-04 DIAGNOSIS — I5022 Chronic systolic (congestive) heart failure: Secondary | ICD-10-CM | POA: Diagnosis not present

## 2020-05-06 ENCOUNTER — Ambulatory Visit: Payer: Medicare Other | Admitting: Cardiology

## 2020-05-06 NOTE — Progress Notes (Deleted)
Cardiology Office Note  Date: 05/06/2020   ID: Timothy SKOOG Sr., DOB 12/23/1940, MRN [id number]  PCP:  [name], MD  Cardiologist:  Rozann Lesches, MD Electrophysiologist:  [name], MD   No chief complaint on file.   History of Present Illness: Timothy Meckel. is a 80 y.o. male last seen in September 2021.  He sees Dr. Rayann Heman, [facility] CRT-D in place.  He did undergo a generator change in October 2021.  Had an [place] HF biventricular device implanted.  Recent device check indicated normal thoracic impedance.   He remains on Coumadin, followed by Dr. Manuella Ghazi.  Past Medical History:  Diagnosis Date  . AICD (automatic cardioverter/defibrillator) present 07/11/2012  . Aortic aneurysm, thoracic ([place])    Fusiform s/p Bentall procedure 2002  . Aortic valve, bicuspid    Mechanical AVR  . Basal cell carcinoma of face   . BPH (benign prostatic hypertrophy)   . Coronary atherosclerosis of native coronary artery    Nonobstructive at cardiac catherization 2002  . Essential hypertension   . Left bundle branch block   . Mixed hyperlipidemia   . Nonischemic cardiomyopathy (HCC)    CRT-D  . Persistent atrial fibrillation [facility])     Past Surgical History:  Procedure Laterality Date  . BENTALL PROCEDURE  2002   Dr Cyndia Bent   . BI-VENTRICULAR IMPLANTABLE CARDIOVERTER DEFIBRILLATOR N/A 07/12/2012   Procedure: BI-VENTRICULAR IMPLANTABLE CARDIOVERTER DEFIBRILLATOR  (CRT-D);  Surgeon: [name], MD;  Location: [facility] CATH LAB;  Service: Cardiovascular;  Laterality: N/A;  . BI-VENTRICULAR IMPLANTABLE CARDIOVERTER DEFIBRILLATOR  (CRT-D)  07/11/2012   [facility]- Dr. Rayann Heman  . BIV ICD GENERATOR CHANGEOUT N/A 12/03/2019   Procedure: BIV ICD GENERATOR CHANGEOUT;  Surgeon: [name], MD;  Location: [place] CV LAB;  Service: Cardiovascular;  Laterality: N/A;  . CARDIAC CATHETERIZATION  2002  . CARDIAC VALVE REPLACEMENT     AVR  . CATARACT  EXTRACTION Left 01/21/13  . COLONOSCOPY N/A 12/01/2016   Procedure: COLONOSCOPY;  Surgeon: Rogene Houston, MD;  [facility];  Service: Endoscopy;  Laterality: N/A;  2:00  . COLONOSCOPY WITH PROPOFOL N/A 11/08/2017   Procedure: COLONOSCOPY WITH PROPOFOL;  Surgeon: Ronnette Juniper, MD;  Location: [place];  Service: Gastroenterology;  Laterality: N/A;  . ESOPHAGOGASTRODUODENOSCOPY (EGD) WITH PROPOFOL N/A 11/08/2017   Procedure: ESOPHAGOGASTRODUODENOSCOPY (EGD) WITH PROPOFOL Needs INR<2;  Surgeon: Ronnette Juniper, MD;  Location: [place];  Service: Gastroenterology;  Laterality: N/A;  . HERNIA REPAIR    . IR ANGIOGRAM SELECTIVE EACH ADDITIONAL VESSEL  11/10/2017  . IR ANGIOGRAM VISCERAL SELECTIVE  11/10/2017  . IR ANGIOGRAM VISCERAL SELECTIVE  11/10/2017  . IR ANGIOGRAM VISCERAL SELECTIVE  11/10/2017  . IR EMBO ART  VEN HEMORR LYMPH EXTRAV  INC GUIDE ROADMAPPING  11/10/2017  . IR US GUIDE VASC ACCESS RIGHT  11/10/2017  . POLYPECTOMY  11/08/2017   Procedure: POLYPECTOMY;  Surgeon: Ronnette Juniper, MD;  Location[facility] ENDOSCOPY;  Service: Gastroenterology;;  . TONSILLECTOMY AND ADENOIDECTOMY  1949  . UMBILICAL HERNIA REPAIR  09/2017    Current Outpatient Medications  Medication Sig Dispense Refill  . acetaminophen (TYLENOL) 500 MG tablet Take 1,000 mg by mouth every 6 (six) hours as needed for headache (pain).    Marland Kitchen atorvastatin (LIPITOR) 10 MG tablet Take 10 mg by mouth daily in the afternoon.     . donepezil (ARICEPT) 5 MG tablet Take 5 mg by mouth daily in the afternoon.     Marland Kitchen  escitalopram (LEXAPRO) 5 MG tablet Take 5 mg by mouth daily in the afternoon.    . finasteride (PROSCAR) 5 MG tablet Take 5 mg by mouth daily in the afternoon.     . furosemide (LASIX) 20 MG tablet Take 20 mg by mouth daily as needed for fluid or edema.     Marland Kitchen losartan (COZAAR) 25 MG tablet TAKE 1/2 TABLET BY MOUTH AT BEDTIME (Cut tabs in half) (Patient taking differently: Take 12.5 mg by mouth at bedtime.) 45 tablet 4   . meclizine (ANTIVERT) 25 MG tablet Take 25 mg by mouth 3 (three) times daily as needed for dizziness.    . metoprolol succinate (TOPROL-XL) 100 MG 24 hr tablet Take 1.5 tablets (150 mg total) by mouth in the morning and at bedtime. 270 tablet 1  . Multiple Vitamin (MULTIVITAMIN WITH MINERALS) TABS tablet Take 1 tablet by mouth daily.    . Omega-3 Fatty Acids (FISH OIL) 1000 MG CAPS Take 1,000 mg by mouth every evening.    . pantoprazole (PROTONIX) 40 MG tablet Take 40 mg by mouth daily in the afternoon.     . tamsulosin (FLOMAX) 0.4 MG CAPS capsule Take 0.4 mg by mouth at bedtime.    Marland Kitchen warfarin (COUMADIN) 5 MG tablet Take 5 mg by mouth daily at 6 PM.      No current facility-administered medications for this visit.   Allergies:  Patient has no known allergies.   Social History: The patient  reports that he quit smoking about 20 years ago. His smoking use included cigarettes. He has a 41.00 pack-year smoking history. He has quit using smokeless tobacco.  His smokeless tobacco use included snuff and chew. He reports current alcohol use. He reports that he does not use drugs.   Family History: The patient's family history includes Coronary artery disease in his father.   ROS:  Please see the history of present illness. Otherwise, complete review of systems is positive for {NONE DEFAULTED:18576::"none"}.  All other systems are reviewed and negative.   Physical Exam: VS:  There were no vitals taken for this visit., BMI There is no height or weight on file to calculate BMI.  Wt Readings from Last 3 Encounters:  03/20/20 200 lb (90.7 kg)  02/05/20 202 lb (91.6 kg)  12/30/19 196 lb 6.4 oz (89.1 kg)    General: Patient appears comfortable at rest. HEENT: Conjunctiva and lids normal, oropharynx clear with moist mucosa. Neck: Supple, no elevated JVP or carotid bruits, no thyromegaly. Lungs: Clear to auscultation, nonlabored breathing at rest. Cardiac: Regular rate and rhythm, no S3 or  significant systolic murmur, no pericardial rub. Abdomen: Soft, nontender, no hepatomegaly, bowel sounds present, no guarding or rebound. Extremities: No pitting edema, distal pulses 2+. Skin: Warm and dry. Musculoskeletal: No kyphosis. Neuropsychiatric: Alert and oriented x3, affect grossly appropriate.  ECG:  An ECG dated 03/20/2020 was personally reviewed today and demonstrated:  Ventricular paced rhythm.  Recent Labwork: 11/25/2019: Hemoglobin 11.9; Platelets 159 02/05/2020: BUN 19; Creatinine, Ser 1.04; Potassium 4.6; Sodium 141   Other Studies Reviewed Today:  Echocardiogram 04/17/2019: 1. Left ventricular ejection fraction, by estimation, is 50 to 55%. The  left ventricle has low normal function. The left ventricle has no regional  wall motion abnormalities. Left ventricular diastolic parameters are  indeterminate.  2. Right ventricular systolic function is normal. The right ventricular  size is normal. There is normal pulmonary artery systolic pressure. The  estimated right ventricular systolic pressure is 28.3 mmHg.  3.  Left atrial size was mildly dilated.  4. Right atrial size was upper normal.  5. The mitral valve is grossly normal. Mild to moderate mitral valve  regurgitation.  6. Tricuspid valve regurgitation is moderate.  7. The aortic valve has been repaired/replaced. There is a mechanical  prosthesis in postion with grossly normal function. Aortic valve  regurgitation is trivial. Aortic valve mean gradient measures 8.0 mmHg.  8. The inferior vena cava is normal in size with greater than 50%  respiratory variability, suggesting right atrial pressure of 3 mmHg.   Assessment and Plan:    Medication Adjustments/Labs and Tests Ordered: Current medicines are reviewed at length with the patient today.  Concerns regarding medicines are outlined above.   Tests Ordered: No orders of the defined types were placed in this encounter.   Medication Changes: No  orders of the defined types were placed in this encounter.   Disposition:  Follow up {follow up:15908}  Signed, Satira Sark, MD, [facility] 05/06/2020 12:18 PM    [place] [facility] [place], [place], [place] [address] Phone: [phone]; Fax: [phone]

## 2020-05-07 ENCOUNTER — Telehealth: Payer: Self-pay

## 2020-05-07 NOTE — Telephone Encounter (Signed)
Called patient about missed ICM transmission but no answer/no voicemail.

## 2020-05-08 ENCOUNTER — Telehealth: Payer: Self-pay

## 2020-05-08 NOTE — Telephone Encounter (Signed)
Remote ICM transmission received.  Attempted call to patient regarding ICM remote transmission and mail box not accepting calls.

## 2020-05-08 NOTE — Progress Notes (Signed)
EPIC Encounter for ICM Monitoring  Patient Name: Timothy Fritz. is a 80 y.o. male Date: 05/08/2020 Primary Care Physican: [name], MD Primary Cardiologist:McDowell Electrophysiologist: Allred Bi-V Pacing: 94% 03/20/2020 Office Weight:200lbs  AT/AF >99%   Attempted call to patient and unable to reach.  Transmission reviewed.   Corvue thoracic impedancesuggesting normal fluid levels.  Prescribed:Furosemide 20 mg 1 tablet dailyas needed.  Labs: 02/05/2020 Creatinine 1.04, BUN 19, Potassium 4.6, Sodium 141, GFR 68-79  11/25/2019 Creatinine 1.16, BUN 14, Potassium 4.9, Sodium 141, GFR 60-69  A complete set of results can be found in Results Review.  Recommendations:  Unable to reach.    Follow-up plan: ICM clinic phone appointment on4/25/2022.91 day device clinic remote transmission4/20/2022.   EP/Cardiology Office Visits:03/19/2021 with Dr Rayann Heman.  Copy of ICM check sent to Dr.Allred.  3 month ICM trend: 05/07/2020.    1 Year ICM trend:       [name], RN 05/08/2020 2:00 PM

## 2020-05-26 DIAGNOSIS — I10 Essential (primary) hypertension: Secondary | ICD-10-CM | POA: Diagnosis not present

## 2020-05-26 DIAGNOSIS — G309 Alzheimer's disease, unspecified: Secondary | ICD-10-CM | POA: Diagnosis not present

## 2020-05-26 DIAGNOSIS — Z299 Encounter for prophylactic measures, unspecified: Secondary | ICD-10-CM | POA: Diagnosis not present

## 2020-05-26 DIAGNOSIS — I428 Other cardiomyopathies: Secondary | ICD-10-CM | POA: Diagnosis not present

## 2020-05-26 DIAGNOSIS — Z6830 Body mass index (BMI) 30.0-30.9, adult: Secondary | ICD-10-CM | POA: Diagnosis not present

## 2020-05-26 DIAGNOSIS — F0280 Dementia in other diseases classified elsewhere without behavioral disturbance: Secondary | ICD-10-CM | POA: Diagnosis not present

## 2020-05-26 DIAGNOSIS — I4891 Unspecified atrial fibrillation: Secondary | ICD-10-CM | POA: Diagnosis not present

## 2020-06-12 ENCOUNTER — Other Ambulatory Visit: Payer: Self-pay | Admitting: Cardiology

## 2020-06-12 NOTE — Progress Notes (Signed)
No ICM remote transmission received for 06/08/2020 and next ICM transmission scheduled for 07/20/2020.

## 2020-07-14 ENCOUNTER — Ambulatory Visit (INDEPENDENT_AMBULATORY_CARE_PROVIDER_SITE_OTHER): Payer: Medicare PPO

## 2020-07-14 DIAGNOSIS — I428 Other cardiomyopathies: Secondary | ICD-10-CM

## 2020-07-14 DIAGNOSIS — I5022 Chronic systolic (congestive) heart failure: Secondary | ICD-10-CM

## 2020-07-14 LAB — CUP PACEART REMOTE DEVICE CHECK
Battery Remaining Longevity: 83 mo
Battery Remaining Percentage: 89 %
Battery Voltage: 2.99 V
Date Time Interrogation Session: 20220530210133
HighPow Impedance: 71 Ohm
Implantable Lead Implant Date: 20140529
Implantable Lead Implant Date: 20140529
Implantable Lead Implant Date: 20140529
Implantable Lead Location: 753858
Implantable Lead Location: 753859
Implantable Lead Location: 753860
Implantable Pulse Generator Implant Date: 20211019
Lead Channel Impedance Value: 360 Ohm
Lead Channel Impedance Value: 440 Ohm
Lead Channel Impedance Value: 680 Ohm
Lead Channel Pacing Threshold Amplitude: 1 V
Lead Channel Pacing Threshold Amplitude: 1.25 V
Lead Channel Pacing Threshold Pulse Width: 0.5 ms
Lead Channel Pacing Threshold Pulse Width: 0.5 ms
Lead Channel Sensing Intrinsic Amplitude: 1.5 mV
Lead Channel Sensing Intrinsic Amplitude: 12 mV
Lead Channel Setting Pacing Amplitude: 2 V
Lead Channel Setting Pacing Amplitude: 2.5 V
Lead Channel Setting Pacing Pulse Width: 0.5 ms
Lead Channel Setting Pacing Pulse Width: 0.5 ms
Lead Channel Setting Sensing Sensitivity: 0.5 mV
Pulse Gen Serial Number: 810006284

## 2020-07-15 DIAGNOSIS — K219 Gastro-esophageal reflux disease without esophagitis: Secondary | ICD-10-CM | POA: Diagnosis not present

## 2020-07-15 DIAGNOSIS — J471 Bronchiectasis with (acute) exacerbation: Secondary | ICD-10-CM | POA: Diagnosis not present

## 2020-07-23 ENCOUNTER — Telehealth: Payer: Self-pay

## 2020-07-23 NOTE — Telephone Encounter (Signed)
No answer/no voicemail. I attempted to call patient to help him send missed ICM transmission for Margarita Grizzle short.

## 2020-07-24 NOTE — Progress Notes (Signed)
No ICM remote transmission received for 07/20/2020 and next ICM transmission scheduled for 08/03/2020.

## 2020-08-04 NOTE — Progress Notes (Signed)
Remote ICD transmission.   

## 2020-08-06 ENCOUNTER — Telehealth: Payer: Self-pay

## 2020-08-06 NOTE — Telephone Encounter (Signed)
I attempted to call patient about missed ICM transmission but no answer/ no voicemail.

## 2020-08-07 NOTE — Progress Notes (Signed)
No ICM remote transmission received for 08/03/2020 and next ICM transmission scheduled for 09/14/2020.

## 2020-08-14 DIAGNOSIS — J471 Bronchiectasis with (acute) exacerbation: Secondary | ICD-10-CM | POA: Diagnosis not present

## 2020-08-14 DIAGNOSIS — K219 Gastro-esophageal reflux disease without esophagitis: Secondary | ICD-10-CM | POA: Diagnosis not present

## 2020-08-20 DIAGNOSIS — J471 Bronchiectasis with (acute) exacerbation: Secondary | ICD-10-CM | POA: Diagnosis not present

## 2020-08-20 DIAGNOSIS — K219 Gastro-esophageal reflux disease without esophagitis: Secondary | ICD-10-CM | POA: Diagnosis not present

## 2020-09-09 ENCOUNTER — Other Ambulatory Visit: Payer: Self-pay | Admitting: Cardiology

## 2020-09-14 DIAGNOSIS — J471 Bronchiectasis with (acute) exacerbation: Secondary | ICD-10-CM | POA: Diagnosis not present

## 2020-09-14 DIAGNOSIS — K219 Gastro-esophageal reflux disease without esophagitis: Secondary | ICD-10-CM | POA: Diagnosis not present

## 2020-09-16 NOTE — Progress Notes (Signed)
Unable to reach patient for monthly ICM remote follow up and not receiving monthly remote transmission since 04/2020.  Patient disenrolled due patient is not actively participating in [facility] clinic.  Device clinic 91 day remote monitoring will continue per protocol.

## 2020-09-17 DIAGNOSIS — I5022 Chronic systolic (congestive) heart failure: Secondary | ICD-10-CM | POA: Diagnosis not present

## 2020-09-17 DIAGNOSIS — Z299 Encounter for prophylactic measures, unspecified: Secondary | ICD-10-CM | POA: Diagnosis not present

## 2020-09-17 DIAGNOSIS — I4891 Unspecified atrial fibrillation: Secondary | ICD-10-CM | POA: Diagnosis not present

## 2020-09-17 DIAGNOSIS — G309 Alzheimer's disease, unspecified: Secondary | ICD-10-CM | POA: Diagnosis not present

## 2020-09-17 DIAGNOSIS — I10 Essential (primary) hypertension: Secondary | ICD-10-CM | POA: Diagnosis not present

## 2020-10-15 DIAGNOSIS — J471 Bronchiectasis with (acute) exacerbation: Secondary | ICD-10-CM | POA: Diagnosis not present

## 2020-10-15 DIAGNOSIS — K219 Gastro-esophageal reflux disease without esophagitis: Secondary | ICD-10-CM | POA: Diagnosis not present

## 2020-10-30 ENCOUNTER — Ambulatory Visit (INDEPENDENT_AMBULATORY_CARE_PROVIDER_SITE_OTHER): Payer: Self-pay

## 2020-10-30 DIAGNOSIS — I428 Other cardiomyopathies: Secondary | ICD-10-CM

## 2020-10-30 DIAGNOSIS — I5022 Chronic systolic (congestive) heart failure: Secondary | ICD-10-CM

## 2020-11-02 DIAGNOSIS — Z23 Encounter for immunization: Secondary | ICD-10-CM | POA: Diagnosis not present

## 2020-11-02 DIAGNOSIS — Z299 Encounter for prophylactic measures, unspecified: Secondary | ICD-10-CM | POA: Diagnosis not present

## 2020-11-02 DIAGNOSIS — I4891 Unspecified atrial fibrillation: Secondary | ICD-10-CM | POA: Diagnosis not present

## 2020-11-02 DIAGNOSIS — G309 Alzheimer's disease, unspecified: Secondary | ICD-10-CM | POA: Diagnosis not present

## 2020-11-02 LAB — CUP PACEART REMOTE DEVICE CHECK
Battery Remaining Longevity: 78 mo
Battery Remaining Percentage: 84 %
Battery Voltage: 2.98 V
Date Time Interrogation Session: 20220916112924
HighPow Impedance: 75 Ohm
Implantable Lead Implant Date: 20140529
Implantable Lead Implant Date: 20140529
Implantable Lead Implant Date: 20140529
Implantable Lead Location: 753858
Implantable Lead Location: 753859
Implantable Lead Location: 753860
Implantable Pulse Generator Implant Date: 20211019
Lead Channel Impedance Value: 380 Ohm
Lead Channel Impedance Value: 400 Ohm
Lead Channel Impedance Value: 700 Ohm
Lead Channel Pacing Threshold Amplitude: 1 V
Lead Channel Pacing Threshold Amplitude: 1.25 V
Lead Channel Pacing Threshold Pulse Width: 0.5 ms
Lead Channel Pacing Threshold Pulse Width: 0.5 ms
Lead Channel Sensing Intrinsic Amplitude: 1.5 mV
Lead Channel Sensing Intrinsic Amplitude: 12 mV
Lead Channel Setting Pacing Amplitude: 2 V
Lead Channel Setting Pacing Amplitude: 2.5 V
Lead Channel Setting Pacing Pulse Width: 0.5 ms
Lead Channel Setting Pacing Pulse Width: 0.5 ms
Lead Channel Setting Sensing Sensitivity: 0.5 mV
Pulse Gen Serial Number: 810006284

## 2020-11-05 NOTE — Progress Notes (Signed)
Remote ICD transmission.   

## 2020-11-14 DIAGNOSIS — J471 Bronchiectasis with (acute) exacerbation: Secondary | ICD-10-CM | POA: Diagnosis not present

## 2020-11-14 DIAGNOSIS — K219 Gastro-esophageal reflux disease without esophagitis: Secondary | ICD-10-CM | POA: Diagnosis not present

## 2020-12-15 DIAGNOSIS — J471 Bronchiectasis with (acute) exacerbation: Secondary | ICD-10-CM | POA: Diagnosis not present

## 2020-12-15 DIAGNOSIS — K219 Gastro-esophageal reflux disease without esophagitis: Secondary | ICD-10-CM | POA: Diagnosis not present

## 2020-12-18 ENCOUNTER — Other Ambulatory Visit: Payer: Self-pay | Admitting: Cardiology

## 2020-12-30 ENCOUNTER — Other Ambulatory Visit: Payer: Self-pay | Admitting: Cardiology

## 2021-01-14 DIAGNOSIS — K219 Gastro-esophageal reflux disease without esophagitis: Secondary | ICD-10-CM | POA: Diagnosis not present

## 2021-01-14 DIAGNOSIS — J471 Bronchiectasis with (acute) exacerbation: Secondary | ICD-10-CM | POA: Diagnosis not present

## 2021-01-18 DIAGNOSIS — Z299 Encounter for prophylactic measures, unspecified: Secondary | ICD-10-CM | POA: Diagnosis not present

## 2021-01-18 DIAGNOSIS — R5383 Other fatigue: Secondary | ICD-10-CM | POA: Diagnosis not present

## 2021-01-18 DIAGNOSIS — Z7189 Other specified counseling: Secondary | ICD-10-CM | POA: Diagnosis not present

## 2021-01-18 DIAGNOSIS — Z0000 Encounter for general adult medical examination without abnormal findings: Secondary | ICD-10-CM | POA: Diagnosis not present

## 2021-01-18 DIAGNOSIS — I5022 Chronic systolic (congestive) heart failure: Secondary | ICD-10-CM | POA: Diagnosis not present

## 2021-01-18 DIAGNOSIS — Z79899 Other long term (current) drug therapy: Secondary | ICD-10-CM | POA: Diagnosis not present

## 2021-01-18 DIAGNOSIS — E7800 Pure hypercholesterolemia, unspecified: Secondary | ICD-10-CM | POA: Diagnosis not present

## 2021-01-18 DIAGNOSIS — I4891 Unspecified atrial fibrillation: Secondary | ICD-10-CM | POA: Diagnosis not present

## 2021-02-14 DIAGNOSIS — K219 Gastro-esophageal reflux disease without esophagitis: Secondary | ICD-10-CM | POA: Diagnosis not present

## 2021-02-14 DIAGNOSIS — J471 Bronchiectasis with (acute) exacerbation: Secondary | ICD-10-CM | POA: Diagnosis not present

## 2021-02-19 DIAGNOSIS — I10 Essential (primary) hypertension: Secondary | ICD-10-CM | POA: Diagnosis not present

## 2021-02-19 DIAGNOSIS — Z87891 Personal history of nicotine dependence: Secondary | ICD-10-CM | POA: Diagnosis not present

## 2021-02-19 DIAGNOSIS — I509 Heart failure, unspecified: Secondary | ICD-10-CM | POA: Diagnosis not present

## 2021-02-19 DIAGNOSIS — I4891 Unspecified atrial fibrillation: Secondary | ICD-10-CM | POA: Diagnosis not present

## 2021-02-19 DIAGNOSIS — Z299 Encounter for prophylactic measures, unspecified: Secondary | ICD-10-CM | POA: Diagnosis not present

## 2021-03-11 ENCOUNTER — Other Ambulatory Visit: Payer: Self-pay | Admitting: Cardiology

## 2021-03-19 ENCOUNTER — Encounter: Payer: Self-pay | Admitting: Internal Medicine

## 2021-03-20 ENCOUNTER — Other Ambulatory Visit: Payer: Self-pay | Admitting: Cardiology

## 2021-03-22 ENCOUNTER — Other Ambulatory Visit: Payer: Self-pay | Admitting: *Deleted

## 2021-03-22 MED ORDER — METOPROLOL SUCCINATE ER 100 MG PO TB24: 150.0000 mg | ORAL_TABLET | Freq: Two times a day (BID) | ORAL | 0 refills | Status: DC

## 2021-03-31 ENCOUNTER — Encounter: Payer: Self-pay | Admitting: Internal Medicine

## 2021-04-20 DIAGNOSIS — Z87891 Personal history of nicotine dependence: Secondary | ICD-10-CM | POA: Diagnosis not present

## 2021-04-20 DIAGNOSIS — I509 Heart failure, unspecified: Secondary | ICD-10-CM | POA: Diagnosis not present

## 2021-04-20 DIAGNOSIS — I10 Essential (primary) hypertension: Secondary | ICD-10-CM | POA: Diagnosis not present

## 2021-04-20 DIAGNOSIS — Z299 Encounter for prophylactic measures, unspecified: Secondary | ICD-10-CM | POA: Diagnosis not present

## 2021-04-20 DIAGNOSIS — I4891 Unspecified atrial fibrillation: Secondary | ICD-10-CM | POA: Diagnosis not present

## 2021-05-25 DIAGNOSIS — Z0000 Encounter for general adult medical examination without abnormal findings: Secondary | ICD-10-CM | POA: Diagnosis not present

## 2021-05-25 DIAGNOSIS — E7800 Pure hypercholesterolemia, unspecified: Secondary | ICD-10-CM | POA: Diagnosis not present

## 2021-05-25 DIAGNOSIS — Z299 Encounter for prophylactic measures, unspecified: Secondary | ICD-10-CM | POA: Diagnosis not present

## 2021-05-25 DIAGNOSIS — I10 Essential (primary) hypertension: Secondary | ICD-10-CM | POA: Diagnosis not present

## 2021-05-25 DIAGNOSIS — I5022 Chronic systolic (congestive) heart failure: Secondary | ICD-10-CM | POA: Diagnosis not present

## 2021-05-25 DIAGNOSIS — I4891 Unspecified atrial fibrillation: Secondary | ICD-10-CM | POA: Diagnosis not present

## 2021-06-21 ENCOUNTER — Other Ambulatory Visit: Payer: Self-pay | Admitting: Cardiology

## 2021-06-22 DIAGNOSIS — I4891 Unspecified atrial fibrillation: Secondary | ICD-10-CM | POA: Diagnosis not present

## 2021-06-22 DIAGNOSIS — I10 Essential (primary) hypertension: Secondary | ICD-10-CM | POA: Diagnosis not present

## 2021-06-22 DIAGNOSIS — I509 Heart failure, unspecified: Secondary | ICD-10-CM | POA: Diagnosis not present

## 2021-06-22 DIAGNOSIS — Z299 Encounter for prophylactic measures, unspecified: Secondary | ICD-10-CM | POA: Diagnosis not present

## 2021-07-20 DIAGNOSIS — I4891 Unspecified atrial fibrillation: Secondary | ICD-10-CM | POA: Diagnosis not present

## 2021-07-20 DIAGNOSIS — I5022 Chronic systolic (congestive) heart failure: Secondary | ICD-10-CM | POA: Diagnosis not present

## 2021-07-20 DIAGNOSIS — I10 Essential (primary) hypertension: Secondary | ICD-10-CM | POA: Diagnosis not present

## 2021-07-20 DIAGNOSIS — Z299 Encounter for prophylactic measures, unspecified: Secondary | ICD-10-CM | POA: Diagnosis not present

## 2021-08-31 DIAGNOSIS — I4891 Unspecified atrial fibrillation: Secondary | ICD-10-CM | POA: Diagnosis not present

## 2021-08-31 DIAGNOSIS — I509 Heart failure, unspecified: Secondary | ICD-10-CM | POA: Diagnosis not present

## 2021-08-31 DIAGNOSIS — Z299 Encounter for prophylactic measures, unspecified: Secondary | ICD-10-CM | POA: Diagnosis not present

## 2021-08-31 DIAGNOSIS — I10 Essential (primary) hypertension: Secondary | ICD-10-CM | POA: Diagnosis not present

## 2021-10-05 DIAGNOSIS — Z299 Encounter for prophylactic measures, unspecified: Secondary | ICD-10-CM | POA: Diagnosis not present

## 2021-10-05 DIAGNOSIS — I4891 Unspecified atrial fibrillation: Secondary | ICD-10-CM | POA: Diagnosis not present

## 2021-10-05 DIAGNOSIS — Z713 Dietary counseling and surveillance: Secondary | ICD-10-CM | POA: Diagnosis not present

## 2021-10-05 DIAGNOSIS — I10 Essential (primary) hypertension: Secondary | ICD-10-CM | POA: Diagnosis not present

## 2021-10-27 DIAGNOSIS — G309 Alzheimer's disease, unspecified: Secondary | ICD-10-CM | POA: Diagnosis not present

## 2021-10-27 DIAGNOSIS — Z299 Encounter for prophylactic measures, unspecified: Secondary | ICD-10-CM | POA: Diagnosis not present

## 2021-10-27 DIAGNOSIS — I10 Essential (primary) hypertension: Secondary | ICD-10-CM | POA: Diagnosis not present

## 2021-10-27 DIAGNOSIS — I4891 Unspecified atrial fibrillation: Secondary | ICD-10-CM | POA: Diagnosis not present

## 2021-10-27 DIAGNOSIS — Z23 Encounter for immunization: Secondary | ICD-10-CM | POA: Diagnosis not present

## 2021-10-29 ENCOUNTER — Ambulatory Visit (INDEPENDENT_AMBULATORY_CARE_PROVIDER_SITE_OTHER): Payer: Self-pay

## 2021-10-29 DIAGNOSIS — I428 Other cardiomyopathies: Secondary | ICD-10-CM

## 2021-11-12 NOTE — Progress Notes (Signed)
Remote ICD transmission.   

## 2021-11-23 ENCOUNTER — Encounter: Payer: Medicare Other | Admitting: Cardiovascular Disease

## 2021-11-23 NOTE — Progress Notes (Deleted)
PCP: [name], MD   Primary EP: Dr Myles Gip  [name] is a 81 y.o. male who presents today for routine electrophysiology followup.    He was last seen by Dr. Rayann Heman in February 2022.  Since last being seen in our clinic, the patient reports doing very well.  He has developed some memory deficits over the past 6 months.  He is following with Dr Manuella Ghazi for this.    Today, he denies symptoms of palpitations, chest pain, shortness of breath,  lower extremity edema, dizziness, presyncope, syncope, or ICD shocks.  The patient is otherwise without complaint today.   Past Medical History:  Diagnosis Date   AICD (automatic cardioverter/defibrillator) present 07/11/2012   Aortic aneurysm, thoracic ([place])    Fusiform s/p Bentall procedure 2002   Aortic valve, bicuspid    Mechanical AVR   Basal cell carcinoma of face    BPH (benign prostatic hypertrophy)    Coronary atherosclerosis of native coronary artery    Nonobstructive at cardiac catherization 2002   Essential hypertension    Left bundle branch block    Mixed hyperlipidemia    Nonischemic cardiomyopathy (HCC)    CRT-D   Persistent atrial fibrillation [facility])    Past Surgical History:  Procedure Laterality Date   BENTALL PROCEDURE  2002   Dr Cyndia Bent    BI-VENTRICULAR IMPLANTABLE CARDIOVERTER DEFIBRILLATOR N/A 07/12/2012   Procedure: BI-VENTRICULAR IMPLANTABLE CARDIOVERTER DEFIBRILLATOR  (CRT-D);  Surgeon: [name], MD;  Location: [facility] CATH LAB;  Service: Cardiovascular;  Laterality: N/A;   BI-VENTRICULAR IMPLANTABLE CARDIOVERTER DEFIBRILLATOR  (CRT-D)  07/11/2012   [facility]- Dr. Rayann Heman   BIV ICD GENERATOR CHANGEOUT N/A 12/03/2019   Procedure: BIV ICD GENERATOR CHANGEOUT;  Surgeon: [name], MD;  Location: [place] CV LAB;  Service: Cardiovascular;  Laterality: N/A;   CARDIAC CATHETERIZATION  2002   CARDIAC VALVE REPLACEMENT     AVR   CATARACT EXTRACTION Left 01/21/13   COLONOSCOPY N/A  12/01/2016   Procedure: COLONOSCOPY;  Surgeon: Rogene Houston, MD;  [facility];  Service: Endoscopy;  Laterality: N/A;  2:00   COLONOSCOPY WITH PROPOFOL N/A 11/08/2017   Procedure: COLONOSCOPY WITH PROPOFOL;  Surgeon: Ronnette Juniper, MD;  Location: [place];  Service: Gastroenterology;  Laterality: N/A;   ESOPHAGOGASTRODUODENOSCOPY (EGD) WITH PROPOFOL N/A 11/08/2017   Procedure: ESOPHAGOGASTRODUODENOSCOPY (EGD) WITH PROPOFOL Needs INR<2;  Surgeon: Ronnette Juniper, MD;  Location: [place];  Service: Gastroenterology;  Laterality: N/A;   HERNIA REPAIR     IR ANGIOGRAM SELECTIVE EACH ADDITIONAL VESSEL  11/10/2017   IR ANGIOGRAM VISCERAL SELECTIVE  11/10/2017   IR ANGIOGRAM VISCERAL SELECTIVE  11/10/2017   IR ANGIOGRAM VISCERAL SELECTIVE  11/10/2017   IR EMBO ART  VEN HEMORR LYMPH EXTRAV  INC GUIDE ROADMAPPING  11/10/2017   IR US GUIDE VASC ACCESS RIGHT  11/10/2017   POLYPECTOMY  11/08/2017   Procedure: POLYPECTOMY;  Surgeon: Ronnette Juniper, MD;  Location: [place];  Service: Gastroenterology;;   TONSILLECTOMY AND ADENOIDECTOMY  2778   UMBILICAL HERNIA REPAIR  09/2017    ROS- all systems are reviewed and negative except as per HPI above  Current Outpatient Medications  Medication Sig Dispense Refill   acetaminophen (TYLENOL) 500 MG tablet Take 1,000 mg by mouth every 6 (six) hours as needed for headache (pain).     atorvastatin (LIPITOR) 10 MG tablet Take 10 mg by mouth daily in the afternoon.      donepezil (ARICEPT) 5 MG tablet  Take 5 mg by mouth daily in the afternoon.      escitalopram (LEXAPRO) 5 MG tablet Take 5 mg by mouth daily in the afternoon.     finasteride (PROSCAR) 5 MG tablet Take 5 mg by mouth daily in the afternoon.      furosemide (LASIX) 20 MG tablet Take 20 mg by mouth daily as needed for fluid or edema.      losartan (COZAAR) 25 MG tablet TAKE 1/2 TABLET BY MOUTH AT BEDTIME (Cut tabs in half) 7 tablet 0   meclizine (ANTIVERT) 25 MG tablet Take 25 mg by mouth 3  (three) times daily as needed for dizziness.     metoprolol succinate (TOPROL-XL) 100 MG 24 hr tablet TAKE 1 AND 1/2 TABLETS BY MOUTH TWICE DAILY - PT NEEDS OFFICE VISIT WITH DR. MCDOWELL 70 tablet 0   Multiple Vitamin (MULTIVITAMIN WITH MINERALS) TABS tablet Take 1 tablet by mouth daily.     Omega-3 Fatty Acids (FISH OIL) 1000 MG CAPS Take 1,000 mg by mouth every evening.     pantoprazole (PROTONIX) 40 MG tablet Take 40 mg by mouth daily in the afternoon.      tamsulosin (FLOMAX) 0.4 MG CAPS capsule Take 0.4 mg by mouth at bedtime.     warfarin (COUMADIN) 5 MG tablet Take 5 mg by mouth daily at 6 PM.      No current facility-administered medications for this visit.    Physical Exam: There were no vitals filed for this visit.   GEN- The patient is well appearing, alert and oriented x 3 today.   Head- normocephalic, atraumatic Eyes-  Sclera clear, conjunctiva pink Ears- hearing intact Oropharynx- clear Lungs-   normal work of breathing Chest- ICD pocket is well healed Heart- Regular rate and rhythm (V paced) GI- soft  Extremities- no clubbing, cyanosis, or edema  ICD interrogation- reviewed in detail today,  See PACEART report  ekg tracing ordered today is personally reviewed and shows afib, V paced  Wt Readings from Last 3 Encounters:  03/20/20 200 lb (90.7 kg)  02/05/20 202 lb (91.6 kg)  12/30/19 196 lb 6.4 oz (89.1 kg)    Assessment and Plan:  1.  Chronic systolic dysfunction/ nonischemic CM euvolemic today Stable on an appropriate medical regimen Normal ICD function See [facility] report No changes today he is not device dependant today followed in ICM device clinic  2. Permanent afib Rate controlled 92 % BiV paced Not a candidate for PVI Could consider AV nodal ablation  3. Mechanical AVR/ Bentall procedure On coumadin  4. HTN Stable No change required today     Return in a year  Melida Quitter, MD  11/23/2021 1:53 PM

## 2021-12-09 DIAGNOSIS — Z299 Encounter for prophylactic measures, unspecified: Secondary | ICD-10-CM | POA: Diagnosis not present

## 2021-12-09 DIAGNOSIS — I5022 Chronic systolic (congestive) heart failure: Secondary | ICD-10-CM | POA: Diagnosis not present

## 2021-12-09 DIAGNOSIS — I10 Essential (primary) hypertension: Secondary | ICD-10-CM | POA: Diagnosis not present

## 2021-12-09 DIAGNOSIS — Z6832 Body mass index (BMI) 32.0-32.9, adult: Secondary | ICD-10-CM | POA: Diagnosis not present

## 2021-12-09 DIAGNOSIS — I4891 Unspecified atrial fibrillation: Secondary | ICD-10-CM | POA: Diagnosis not present

## 2022-01-10 DIAGNOSIS — I10 Essential (primary) hypertension: Secondary | ICD-10-CM | POA: Diagnosis not present

## 2022-01-10 DIAGNOSIS — Z6832 Body mass index (BMI) 32.0-32.9, adult: Secondary | ICD-10-CM | POA: Diagnosis not present

## 2022-01-10 DIAGNOSIS — Z299 Encounter for prophylactic measures, unspecified: Secondary | ICD-10-CM | POA: Diagnosis not present

## 2022-01-10 DIAGNOSIS — I4891 Unspecified atrial fibrillation: Secondary | ICD-10-CM | POA: Diagnosis not present

## 2022-01-10 DIAGNOSIS — I509 Heart failure, unspecified: Secondary | ICD-10-CM | POA: Diagnosis not present

## 2022-01-19 DIAGNOSIS — Z125 Encounter for screening for malignant neoplasm of prostate: Secondary | ICD-10-CM | POA: Diagnosis not present

## 2022-01-19 DIAGNOSIS — Z299 Encounter for prophylactic measures, unspecified: Secondary | ICD-10-CM | POA: Diagnosis not present

## 2022-01-19 DIAGNOSIS — Z79899 Other long term (current) drug therapy: Secondary | ICD-10-CM | POA: Diagnosis not present

## 2022-01-19 DIAGNOSIS — E7800 Pure hypercholesterolemia, unspecified: Secondary | ICD-10-CM | POA: Diagnosis not present

## 2022-01-19 DIAGNOSIS — Z1331 Encounter for screening for depression: Secondary | ICD-10-CM | POA: Diagnosis not present

## 2022-01-19 DIAGNOSIS — Z0000 Encounter for general adult medical examination without abnormal findings: Secondary | ICD-10-CM | POA: Diagnosis not present

## 2022-01-19 DIAGNOSIS — E6609 Other obesity due to excess calories: Secondary | ICD-10-CM | POA: Diagnosis not present

## 2022-01-19 DIAGNOSIS — I10 Essential (primary) hypertension: Secondary | ICD-10-CM | POA: Diagnosis not present

## 2022-01-19 DIAGNOSIS — Z7189 Other specified counseling: Secondary | ICD-10-CM | POA: Diagnosis not present

## 2022-01-19 DIAGNOSIS — Z6832 Body mass index (BMI) 32.0-32.9, adult: Secondary | ICD-10-CM | POA: Diagnosis not present

## 2022-01-19 DIAGNOSIS — R5383 Other fatigue: Secondary | ICD-10-CM | POA: Diagnosis not present

## 2022-01-19 DIAGNOSIS — I509 Heart failure, unspecified: Secondary | ICD-10-CM | POA: Diagnosis not present

## 2022-03-11 ENCOUNTER — Telehealth: Payer: Self-pay | Admitting: Cardiology

## 2022-03-11 DIAGNOSIS — F0280 Dementia in other diseases classified elsewhere without behavioral disturbance: Secondary | ICD-10-CM | POA: Diagnosis not present

## 2022-03-11 DIAGNOSIS — I10 Essential (primary) hypertension: Secondary | ICD-10-CM | POA: Diagnosis not present

## 2022-03-11 DIAGNOSIS — I4891 Unspecified atrial fibrillation: Secondary | ICD-10-CM | POA: Diagnosis not present

## 2022-03-11 DIAGNOSIS — Z299 Encounter for prophylactic measures, unspecified: Secondary | ICD-10-CM | POA: Diagnosis not present

## 2022-03-11 DIAGNOSIS — G309 Alzheimer's disease, unspecified: Secondary | ICD-10-CM | POA: Diagnosis not present

## 2022-03-11 DIAGNOSIS — Z6832 Body mass index (BMI) 32.0-32.9, adult: Secondary | ICD-10-CM | POA: Diagnosis not present

## 2022-03-11 MED ORDER — METOPROLOL SUCCINATE ER 100 MG PO TB24: ORAL_TABLET | ORAL | 0 refills | Status: DC

## 2022-03-11 NOTE — Telephone Encounter (Signed)
*  STAT* If patient is at the pharmacy, call can be transferred to refill team.   1. Which medications need to be refilled? (please list name of each medication and dose if known) metoprolol succinate (TOPROL-XL) 100 MG 24 hr tablet [[id number]]   2. Which pharmacy/location (including street and city if local pharmacy) is medication to be sent to?[facility]   3. Do they need a 30 day or 90 day supply? Needs enough to last until we can get him into the office.

## 2022-03-15 ENCOUNTER — Ambulatory Visit: Payer: Medicare PPO | Admitting: Nurse Practitioner

## 2022-03-15 NOTE — Progress Notes (Deleted)
Cardiology Office Note:    Date:  03/15/2022   ID:  Timothy Fowler Sr., DOB 25-Sep-1940, MRN [id number]  PCP:  [name], [place] Providers Cardiologist:  Rozann Lesches, MD { Click to update primary MD,subspecialty MD or APP then REFRESH:1}    Referring MD: [name], MD   No chief complaint on file. ***  History of Present Illness:    Timothy CHILCOAT Sr. is a 82 y.o. male with a hx of ***  CAD Chronic combined CHF, nonischemic cardiomyopathy LBBB Hypertension A-fib, status post cardioversion, on [facility] S/p aortic valve replacement metallic valve History of ICD placement Mixed hyperlipidemia  Patient is a 82 year old male with past medical history as mentioned above.  Previous cardiovascular history includes bicuspid aortic valve and thoracic aortic aneurysm, status post AVR with Bentall procedure in 2002.  Had cardiac catheterization in 2002 that revealed nonobstructive CAD.  History of NICM with recovered EF, and permanent A-fib.  Has been followed by Dr. Rayann Heman for history of ICD placement.  Last seen by [name], PA-C on December 30, 2019.  Overall he was doing well at the time.  Denied any acute cardiac complaints or issues.  He had generator change performed on December 03, 2019.  No medications were changed.  Was instructed to follow-up with Dr. Rayann Heman and Dr. Domenic Polite as scheduled; however, last seen by Dr. Domenic Polite on 10/31/2019 and was doing well at the time.  Today he presents for overdue follow-up.  He states.      Past Medical History:  Diagnosis Date   AICD (automatic cardioverter/defibrillator) present 07/11/2012   Aortic aneurysm, thoracic ([place])    Fusiform s/p Bentall procedure 2002   Aortic valve, bicuspid    Mechanical AVR   Basal cell carcinoma of face    BPH (benign prostatic hypertrophy)    Coronary atherosclerosis of native coronary artery    Nonobstructive at cardiac catherization 2002   Essential hypertension     Left bundle branch block    Mixed hyperlipidemia    Nonischemic cardiomyopathy (HCC)    CRT-D   Persistent atrial fibrillation [facility])     Past Surgical History:  Procedure Laterality Date   BENTALL PROCEDURE  2002   Dr Cyndia Bent    BI-VENTRICULAR IMPLANTABLE CARDIOVERTER DEFIBRILLATOR N/A 07/12/2012   Procedure: BI-VENTRICULAR IMPLANTABLE CARDIOVERTER DEFIBRILLATOR  (CRT-D);  Surgeon: [name], MD;  Location: [facility] CATH LAB;  Service: Cardiovascular;  Laterality: N/A;   BI-VENTRICULAR IMPLANTABLE CARDIOVERTER DEFIBRILLATOR  (CRT-D)  07/11/2012   [facility]- Dr. Rayann Heman   BIV ICD GENERATOR CHANGEOUT N/A 12/03/2019   Procedure: BIV ICD GENERATOR CHANGEOUT;  Surgeon: [name], MD;  Location: [place] CV LAB;  Service: Cardiovascular;  Laterality: N/A;   CARDIAC CATHETERIZATION  2002   CARDIAC VALVE REPLACEMENT     AVR   CATARACT EXTRACTION Left 01/21/13   COLONOSCOPY N/A 12/01/2016   Procedure: COLONOSCOPY;  Surgeon: Rogene Houston, MD;  [facility];  Service: Endoscopy;  Laterality: N/A;  2:00   COLONOSCOPY WITH PROPOFOL N/A 11/08/2017   Procedure: COLONOSCOPY WITH PROPOFOL;  Surgeon: Ronnette Juniper, MD;  Location: [place];  Service: Gastroenterology;  Laterality: N/A;   ESOPHAGOGASTRODUODENOSCOPY (EGD) WITH PROPOFOL N/A 11/08/2017   Procedure: ESOPHAGOGASTRODUODENOSCOPY (EGD) WITH PROPOFOL Needs INR<2;  Surgeon: Ronnette Juniper, MD;  Location: [place];  Service: Gastroenterology;  Laterality: N/A;   HERNIA REPAIR     IR ANGIOGRAM SELECTIVE EACH ADDITIONAL VESSEL  11/10/2017  IR ANGIOGRAM VISCERAL SELECTIVE  11/10/2017   IR ANGIOGRAM VISCERAL SELECTIVE  11/10/2017   IR ANGIOGRAM VISCERAL SELECTIVE  11/10/2017   IR EMBO ART  VEN HEMORR LYMPH EXTRAV  INC GUIDE ROADMAPPING  11/10/2017   IR US GUIDE VASC ACCESS RIGHT  11/10/2017   POLYPECTOMY  11/08/2017   Procedure: POLYPECTOMY;  Surgeon: Ronnette Juniper, MD;  Location: [place];  Service:  Gastroenterology;;   TONSILLECTOMY AND ADENOIDECTOMY  0000000   UMBILICAL HERNIA REPAIR  09/2017    Current Medications: No outpatient medications have been marked as taking for the 03/15/22 encounter (Appointment) with Finis Bud, NP.     Allergies:   Patient has no known allergies.   Social History   Socioeconomic History   Marital status: Widowed    Spouse name: Not on file   Number of children: Not on file   Years of education: Not on file   Highest education level: Not on file  Occupational History   Occupation: Charity fundraiser  Tobacco Use   Smoking status: Former    Packs/day: 1.00    Years: 41.00    Total pack years: 41.00    Types: Cigarettes    Quit date: 02/15/2000    Years since quitting: 22.0   Smokeless tobacco: Former    Types: Snuff, Chew   Tobacco comments:    11/06/2017 "used chew and snuff in my teens"  Vaping Use   Vaping Use: Never used  Substance and Sexual Activity   Alcohol use: Yes    Comment: 11/06/2017  "2-3 glasses of wine/month"   Drug use: Never   Sexual activity: Not Currently  Other Topics Concern   Not on file  Social History Narrative   Widow, Lives in [place]   Regular Exercise --yes   Retired Charity fundraiser   Attends [employer] Strain: Not on file  Food Insecurity: Not on file  Transportation Needs: Not on file  Physical Activity: Not on file  Stress: Not on file  Social Connections: Not on file     Family History: The patient's ***family history includes Coronary artery disease in his father.  ROS:   Please see the history of present illness.    *** All other systems reviewed and are negative.  EKGs/Labs/Other Studies Reviewed:    The following studies were reviewed today: ***  EKG:  EKG is *** ordered today.  The ekg ordered today demonstrates ***  Recent Labs: No results found for requested labs within last 365 days.  Recent Lipid  Panel No results found for: "CHOL", "TRIG", "HDL", "CHOLHDL", "VLDL", "LDLCALC", "LDLDIRECT"   Risk Assessment/Calculations:   {Does this patient have ATRIAL FIBRILLATION?:[id number]}  No BP recorded.  {Refresh Note OR Click here to enter BP  :1}***         Physical Exam:    VS:  There were no vitals taken for this visit.    Wt Readings from Last 3 Encounters:  03/20/20 200 lb (90.7 kg)  02/05/20 202 lb (91.6 kg)  12/30/19 196 lb 6.4 oz (89.1 kg)     GEN: *** Well nourished, well developed in no acute distress HEENT: Normal NECK: No JVD; No carotid bruits LYMPHATICS: No lymphadenopathy CARDIAC: ***RRR, no murmurs, rubs, gallops RESPIRATORY:  Clear to auscultation without rales, wheezing or rhonchi  ABDOMEN: Soft, non-tender, non-distended MUSCULOSKELETAL:  No edema; No deformity  SKIN: Warm and dry NEUROLOGIC:  Alert and oriented x 3 PSYCHIATRIC:  Normal affect   ASSESSMENT:    No diagnosis found. PLAN:    In order of problems listed above:  ***      {Are you ordering a CV Procedure (e.g. stress test, cath, DCCV, TEE, etc)?   Press F2        :[id number]    Medication Adjustments/Labs and Tests Ordered: Current medicines are reviewed at length with the patient today.  Concerns regarding medicines are outlined above.  No orders of the defined types were placed in this encounter.  No orders of the defined types were placed in this encounter.   There are no Patient Instructions on file for this visit.   Signed, Finis Bud, NP  03/15/2022 8:11 AM    [place]

## 2022-03-17 ENCOUNTER — Ambulatory Visit: Payer: Medicare PPO | Admitting: Nurse Practitioner

## 2022-03-17 NOTE — Progress Notes (Deleted)
Cardiology Office Note:    Date:  03/17/2022   ID:  Timothy Fowler Sr., DOB Aug 15, 1940, MRN [id number]  PCP:  [name], [place] Providers Cardiologist:  Rozann Lesches, MD { Click to update primary MD,subspecialty MD or APP then REFRESH:1}    Referring MD: [name], MD   No chief complaint on file. ***  History of Present Illness:    Timothy Fritz Sr. is a 82 y.o. male with a hx of ***  CAD Chronic combined CHF, nonischemic cardiomyopathy LBBB Hypertension A-fib, status post cardioversion, on [facility] S/p aortic valve replacement metallic valve History of ICD placement Mixed hyperlipidemia  Patient is a 82 year old male with past medical history as mentioned above.  Previous cardiovascular history includes bicuspid aortic valve and thoracic aortic aneurysm, status post AVR with Bentall procedure in 2002.  Had cardiac catheterization in 2002 that revealed nonobstructive CAD.  History of NICM with recovered EF, and permanent A-fib.  Has been followed by Dr. Rayann Heman for history of ICD placement.  Last seen by [name], PA-C on December 30, 2019.  Overall he was doing well at the time.  Denied any acute cardiac complaints or issues.  He had generator change performed on December 03, 2019.  No medications were changed.  Was instructed to follow-up with Dr. Rayann Heman and Dr. Domenic Polite as scheduled; however, last seen by Dr. Domenic Polite on 10/31/2019 and was doing well at the time.  Today he presents for overdue follow-up.  He states.      Past Medical History:  Diagnosis Date   AICD (automatic cardioverter/defibrillator) present 07/11/2012   Aortic aneurysm, thoracic ([place])    Fusiform s/p Bentall procedure 2002   Aortic valve, bicuspid    Mechanical AVR   Basal cell carcinoma of face    BPH (benign prostatic hypertrophy)    Coronary atherosclerosis of native coronary artery    Nonobstructive at cardiac catherization 2002   Essential hypertension     Left bundle branch block    Mixed hyperlipidemia    Nonischemic cardiomyopathy (HCC)    CRT-D   Persistent atrial fibrillation [facility])     Past Surgical History:  Procedure Laterality Date   BENTALL PROCEDURE  2002   Dr Cyndia Bent    BI-VENTRICULAR IMPLANTABLE CARDIOVERTER DEFIBRILLATOR N/A 07/12/2012   Procedure: BI-VENTRICULAR IMPLANTABLE CARDIOVERTER DEFIBRILLATOR  (CRT-D);  Surgeon: [name], MD;  Location: [facility] CATH LAB;  Service: Cardiovascular;  Laterality: N/A;   BI-VENTRICULAR IMPLANTABLE CARDIOVERTER DEFIBRILLATOR  (CRT-D)  07/11/2012   [facility]- Dr. Rayann Heman   BIV ICD GENERATOR CHANGEOUT N/A 12/03/2019   Procedure: BIV ICD GENERATOR CHANGEOUT;  Surgeon: [name], MD;  Location: [place] CV LAB;  Service: Cardiovascular;  Laterality: N/A;   CARDIAC CATHETERIZATION  2002   CARDIAC VALVE REPLACEMENT     AVR   CATARACT EXTRACTION Left 01/21/13   COLONOSCOPY N/A 12/01/2016   Procedure: COLONOSCOPY;  Surgeon: Rogene Houston, MD;  [facility];  Service: Endoscopy;  Laterality: N/A;  2:00   COLONOSCOPY WITH PROPOFOL N/A 11/08/2017   Procedure: COLONOSCOPY WITH PROPOFOL;  Surgeon: Ronnette Juniper, MD;  Location: [place];  Service: Gastroenterology;  Laterality: N/A;   ESOPHAGOGASTRODUODENOSCOPY (EGD) WITH PROPOFOL N/A 11/08/2017   Procedure: ESOPHAGOGASTRODUODENOSCOPY (EGD) WITH PROPOFOL Needs INR<2;  Surgeon: Ronnette Juniper, MD;  Location: [place];  Service: Gastroenterology;  Laterality: N/A;   HERNIA REPAIR     IR ANGIOGRAM SELECTIVE EACH ADDITIONAL VESSEL  11/10/2017  IR ANGIOGRAM VISCERAL SELECTIVE  11/10/2017   IR ANGIOGRAM VISCERAL SELECTIVE  11/10/2017   IR ANGIOGRAM VISCERAL SELECTIVE  11/10/2017   IR EMBO ART  VEN HEMORR LYMPH EXTRAV  INC GUIDE ROADMAPPING  11/10/2017   IR US GUIDE VASC ACCESS RIGHT  11/10/2017   POLYPECTOMY  11/08/2017   Procedure: POLYPECTOMY;  Surgeon: Ronnette Juniper, MD;  Location: [place];  Service:  Gastroenterology;;   TONSILLECTOMY AND ADENOIDECTOMY  0000000   UMBILICAL HERNIA REPAIR  09/2017    Current Medications: No outpatient medications have been marked as taking for the 03/17/22 encounter (Appointment) with Finis Bud, NP.     Allergies:   Patient has no known allergies.   Social History   Socioeconomic History   Marital status: Widowed    Spouse name: Not on file   Number of children: Not on file   Years of education: Not on file   Highest education level: Not on file  Occupational History   Occupation: Charity fundraiser  Tobacco Use   Smoking status: Former    Packs/day: 1.00    Years: 41.00    Total pack years: 41.00    Types: Cigarettes    Quit date: 02/15/2000    Years since quitting: 22.0   Smokeless tobacco: Former    Types: Snuff, Chew   Tobacco comments:    11/06/2017 "used chew and snuff in my teens"  Vaping Use   Vaping Use: Never used  Substance and Sexual Activity   Alcohol use: Yes    Comment: 11/06/2017  "2-3 glasses of wine/month"   Drug use: Never   Sexual activity: Not Currently  Other Topics Concern   Not on file  Social History Narrative   Widow, Lives in [place]   Regular Exercise --yes   Retired Charity fundraiser   Attends [employer] Strain: Not on file  Food Insecurity: Not on file  Transportation Needs: Not on file  Physical Activity: Not on file  Stress: Not on file  Social Connections: Not on file     Family History: The patient's ***family history includes Coronary artery disease in his father.  ROS:   Please see the history of present illness.    *** All other systems reviewed and are negative.  EKGs/Labs/Other Studies Reviewed:    The following studies were reviewed today: ***  EKG:  EKG is *** ordered today.  The ekg ordered today demonstrates ***  Recent Labs: No results found for requested labs within last 365 days.  Recent Lipid  Panel No results found for: "CHOL", "TRIG", "HDL", "CHOLHDL", "VLDL", "LDLCALC", "LDLDIRECT"   Risk Assessment/Calculations:   {Does this patient have ATRIAL FIBRILLATION?[phone]}  No BP recorded.  {Refresh Note OR Click here to enter BP  :1}***         Physical Exam:    VS:  There were no vitals taken for this visit.    Wt Readings from Last 3 Encounters:  03/20/20 200 lb (90.7 kg)  02/05/20 202 lb (91.6 kg)  12/30/19 196 lb 6.4 oz (89.1 kg)     GEN: *** Well nourished, well developed in no acute distress HEENT: Normal NECK: No JVD; No carotid bruits LYMPHATICS: No lymphadenopathy CARDIAC: ***RRR, no murmurs, rubs, gallops RESPIRATORY:  Clear to auscultation without rales, wheezing or rhonchi  ABDOMEN: Soft, non-tender, non-distended MUSCULOSKELETAL:  No edema; No deformity  SKIN: Warm and dry NEUROLOGIC:  Alert and oriented x 3 PSYCHIATRIC:  Normal affect   ASSESSMENT:    No diagnosis found. PLAN:    In order of problems listed above:  ***      {Are you ordering a CV Procedure (e.g. stress test, cath, DCCV, TEE, etc)?   Press F2        :[id number]    Medication Adjustments/Labs and Tests Ordered: Current medicines are reviewed at length with the patient today.  Concerns regarding medicines are outlined above.  No orders of the defined types were placed in this encounter.  No orders of the defined types were placed in this encounter.   There are no Patient Instructions on file for this visit.   Signed, Finis Bud, NP  03/17/2022 8:18 AM    [place]

## 2022-03-22 ENCOUNTER — Encounter: Payer: Self-pay | Admitting: Nurse Practitioner

## 2022-03-22 ENCOUNTER — Other Ambulatory Visit: Payer: Self-pay | Admitting: Nurse Practitioner

## 2022-03-22 ENCOUNTER — Ambulatory Visit: Payer: Medicare PPO | Attending: Nurse Practitioner | Admitting: Nurse Practitioner

## 2022-03-22 VITALS — BP 132/80 | HR 75 | Ht 68.0 in | Wt 213.4 lb

## 2022-03-22 DIAGNOSIS — I4891 Unspecified atrial fibrillation: Secondary | ICD-10-CM

## 2022-03-22 DIAGNOSIS — Z954 Presence of other heart-valve replacement: Secondary | ICD-10-CM

## 2022-03-22 DIAGNOSIS — Z95810 Presence of automatic (implantable) cardiac defibrillator: Secondary | ICD-10-CM

## 2022-03-22 DIAGNOSIS — I359 Nonrheumatic aortic valve disorder, unspecified: Secondary | ICD-10-CM

## 2022-03-22 DIAGNOSIS — I10 Essential (primary) hypertension: Secondary | ICD-10-CM

## 2022-03-22 DIAGNOSIS — E782 Mixed hyperlipidemia: Secondary | ICD-10-CM

## 2022-03-22 DIAGNOSIS — I2510 Atherosclerotic heart disease of native coronary artery without angina pectoris: Secondary | ICD-10-CM | POA: Diagnosis not present

## 2022-03-22 DIAGNOSIS — I5022 Chronic systolic (congestive) heart failure: Secondary | ICD-10-CM | POA: Diagnosis not present

## 2022-03-22 DIAGNOSIS — I428 Other cardiomyopathies: Secondary | ICD-10-CM | POA: Diagnosis not present

## 2022-03-22 MED ORDER — METOPROLOL SUCCINATE ER 100 MG PO TB24: 150.0000 mg | ORAL_TABLET | Freq: Every day | ORAL | 3 refills | Status: DC

## 2022-03-22 NOTE — Progress Notes (Signed)
Cardiology Office Note:    Date:  03/22/2022  ID:  Timothy May Sr., DOB 02/08/1941, MRN [id number]  PCP:  [name], MD   [facility] Providers Cardiologist:  Nona Dell, MD     Referring MD: [name], MD   CC: Here for follow-up  History of Present Illness:    Timothy GMEREK Sr. is a 82 y.o. male with a hx of the following:   CAD Chronic combined CHF, nonischemic cardiomyopathy LBBB Hypertension A-fib, status post cardioversion, on [facility] S/p aortic valve replacement metallic valve History of ICD placement Mixed hyperlipidemia  Patient is a 82 year old male with past medical history as mentioned above. Previously seen by Dr. Johney Frame with EP. Previous cardiovascular history includes bicuspid aortic valve and thoracic aortic aneurysm, status post AVR with Bentall procedure in 2002.  Had cardiac catheterization in 2002 that revealed nonobstructive CAD.  History of NICM with recovered EF, and permanent A-fib.  Has been followed by Dr. Johney Frame for history of ICD placement.  Echocardiogram in March 2021 showed EF 50 to 55%, normally functioning mechanical aortic prosthesis.  Last seen by Dr. Diona Browner in 2021.  He was doing well at the time.  No medication changes were made.  Was told to follow-up in 6 weeks.  Seen by Francis Dowse, PA-C on December 30, 2019.  Overall he was doing well at the time.  Denied any acute cardiac complaints or issues.  He had generator change performed on December 03, 2019.  No medications were changed.  Was instructed to follow-up with Dr. Johney Frame and Dr. Diona Browner as scheduled.   Today he presents for overdue follow-up.  He states he is doing well and denies any recent changes with his health. Denies any chest pain, shortness of breath, palpitations, syncope, presyncope, dizziness, orthopnea, PND, swelling or significant weight changes, acute bleeding, or claudication.  Tolerating his medications well.  He is requesting to be seen by EP for  his device.  Denies any other questions or concerns today.  Past Medical History:  Diagnosis Date   AICD (automatic cardioverter/defibrillator) present 07/11/2012   Aortic aneurysm, thoracic (HCC)    Fusiform s/p Bentall procedure 2002   Aortic valve, bicuspid    Mechanical AVR   Basal cell carcinoma of face    BPH (benign prostatic hypertrophy)    Coronary atherosclerosis of native coronary artery    Nonobstructive at cardiac catherization 2002   Essential hypertension    Left bundle branch block    Mixed hyperlipidemia    Nonischemic cardiomyopathy (HCC)    CRT-D   Persistent atrial fibrillation [facility])     Past Surgical History:  Procedure Laterality Date   BENTALL PROCEDURE  2002   Dr Laneta Simmers    BI-VENTRICULAR IMPLANTABLE CARDIOVERTER DEFIBRILLATOR N/A 07/12/2012   Procedure: BI-VENTRICULAR IMPLANTABLE CARDIOVERTER DEFIBRILLATOR  (CRT-D);  Surgeon: Hillis Range, MD;  Location: [facility] CATH LAB;  Service: Cardiovascular;  Laterality: N/A;   BI-VENTRICULAR IMPLANTABLE CARDIOVERTER DEFIBRILLATOR  (CRT-D)  07/11/2012   [facility] Dr. Johney Frame   BIV ICD GENERATOR CHANGEOUT N/A 12/03/2019   Procedure: BIV ICD GENERATOR CHANGEOUT;  Surgeon: Hillis Range, MD;  Location: [facility] INVASIVE CV LAB;  Service: Cardiovascular;  Laterality: N/A;   CARDIAC CATHETERIZATION  2002   CARDIAC VALVE REPLACEMENT     AVR   CATARACT EXTRACTION Left 01/21/13   COLONOSCOPY N/A 12/01/2016   Procedure: COLONOSCOPY;  Surgeon: Malissa Hippo, MD;  [facility]O [facility];  Service: Endoscopy;  Laterality: N/A;  2:00   COLONOSCOPY WITH PROPOFOL N/A 11/08/2017   Procedure: COLONOSCOPY WITH PROPOFOL;  Surgeon: Kerin Salen, MD;  Location: [facility] ENDOSCOPY;  Service: Gastroenterology;  Laterality: N/A;   ESOPHAGOGASTRODUODENOSCOPY (EGD) WITH PROPOFOL N/A 11/08/2017   Procedure: ESOPHAGOGASTRODUODENOSCOPY (EGD) WITH PROPOFOL Needs INR<2;  Surgeon: Kerin Salen, MD;  Location: [facility] ENDOSCOPY;  Service:  Gastroenterology;  Laterality: N/A;   HERNIA REPAIR     IR ANGIOGRAM SELECTIVE EACH ADDITIONAL VESSEL  11/10/2017   IR ANGIOGRAM VISCERAL SELECTIVE  11/10/2017   IR ANGIOGRAM VISCERAL SELECTIVE  11/10/2017   IR ANGIOGRAM VISCERAL SELECTIVE  11/10/2017   IR EMBO ART  VEN HEMORR LYMPH EXTRAV  INC GUIDE ROADMAPPING  11/10/2017   IR US GUIDE VASC ACCESS RIGHT  11/10/2017   POLYPECTOMY  11/08/2017   Procedure: POLYPECTOMY;  Surgeon: Kerin Salen, MD;  Location: [facility] ENDOSCOPY;  Service: Gastroenterology;;   TONSILLECTOMY AND ADENOIDECTOMY  1949   UMBILICAL HERNIA REPAIR  09/2017    Current Medications: Current Meds  Medication Sig   acetaminophen (TYLENOL) 500 MG tablet Take 1,000 mg by mouth every 6 (six) hours as needed for headache (pain).   atorvastatin (LIPITOR) 10 MG tablet Take 10 mg by mouth daily in the afternoon.    donepezil (ARICEPT) 5 MG tablet Take 5 mg by mouth daily in the afternoon.    escitalopram (LEXAPRO) 5 MG tablet Take 5 mg by mouth daily in the afternoon.   finasteride (PROSCAR) 5 MG tablet Take 5 mg by mouth daily in the afternoon.    furosemide (LASIX) 20 MG tablet Take 20 mg by mouth daily as needed for fluid or edema.    losartan (COZAAR) 25 MG tablet TAKE 1/2 TABLET BY MOUTH AT BEDTIME (Cut tabs in half)   meclizine (ANTIVERT) 25 MG tablet Take 25 mg by mouth 3 (three) times daily as needed for dizziness.   Multiple Vitamin (MULTIVITAMIN WITH MINERALS) TABS tablet Take 1 tablet by mouth daily.   pantoprazole (PROTONIX) 40 MG tablet Take 40 mg by mouth daily in the afternoon.    tamsulosin (FLOMAX) 0.4 MG CAPS capsule Take 0.4 mg by mouth at bedtime.   warfarin (COUMADIN) 5 MG tablet Take 5 mg by mouth daily at 6 PM.    metoprolol succinate (TOPROL-XL) 100 MG 24 hr tablet TAKE 1 AND 1/2 TABLETS BY MOUTH TWICE DAILY - PT NEEDS OFFICE VISIT WITH DR. MCDOWELL     Allergies:   Patient has no known allergies.   Social History   Socioeconomic History   Marital status:  Widowed    Spouse name: Not on file   Number of children: Not on file   Years of education: Not on file   Highest education level: Not on file  Occupational History   Occupation: Nutritional therapist  Tobacco Use   Smoking status: Former    Packs/day: 1.00    Years: 41.00    Total pack years: 41.00    Types: Cigarettes    Quit date: 02/15/2000    Years since quitting: 22.1   Smokeless tobacco: Former    Types: Snuff, Chew   Tobacco comments:    11/06/2017 "used chew and snuff in my teens"  Vaping Use   Vaping Use: Never used  Substance and Sexual Activity   Alcohol use: Yes    Comment: 11/06/2017  "2-3 glasses of wine/month"   Drug use: Never   Sexual activity: Not Currently  Other Topics Concern   Not on file  Social History Narrative  Widow, Lives in [place]   Regular Exercise --yes   Retired Nutritional therapist   Attends [employer]   Social Determinants of Health   Financial Resource Strain: Not on [employer] Insecurity: Not on file  Transportation Needs: Not on file  Physical Activity: Not on file  Stress: Not on file  Social Connections: Not on file     Family History: The patient's family history includes Coronary artery disease in his father.  ROS:   Review of Systems  Constitutional: Negative.   HENT: Negative.    Eyes: Negative.   Respiratory: Negative.    Cardiovascular: Negative.   Gastrointestinal: Negative.   Genitourinary: Negative.   Musculoskeletal: Negative.   Skin: Negative.   Neurological: Negative.   Endo/Heme/Allergies: Negative.   Psychiatric/Behavioral: Negative.      Please see the history of present illness.    All other systems reviewed and are negative.  EKGs/Labs/Other Studies Reviewed:    The following studies were reviewed today:   EKG:  EKG is ordered today.  The ekg ordered today demonstrates V paced rhythm, 75 bpm, no acute ischemic changes.  Echocardiogram on 04/17/2019:  1. Left ventricular ejection  fraction, by estimation, is 50 to 55%. The  left ventricle has low normal function. The left ventricle has no regional  wall motion abnormalities. Left ventricular diastolic parameters are  indeterminate.   2. Right ventricular systolic function is normal. The right ventricular  size is normal. There is normal pulmonary artery systolic pressure. The  estimated right ventricular systolic pressure is 28.0 mmHg.   3. Left atrial size was mildly dilated.   4. Right atrial size was upper normal.   5. The mitral valve is grossly normal. Mild to moderate mitral valve  regurgitation.   6. Tricuspid valve regurgitation is moderate.   7. The aortic valve has been repaired/replaced. There is a mechanical  prosthesis in postion with grossly normal function. Aortic valve  regurgitation is trivial. Aortic valve mean gradient measures 8.0 mmHg.   8. The inferior vena cava is normal in size with greater than 50%  respiratory variability, suggesting right atrial pressure of 3 mmHg.  Lexiscan 12/2010:  IMPRESSION:  Abnormal pharmacologic stress nuclear myocardial study revealing a  nondiagnostic EKG due to the presence of left bundle branch block,  atrial fibrillation throughout the study, mild left ventricular  dilatation and moderately depressed left ventricular systolic  function with multiple segmental wall motion abnormalities as  described.  By scintigraphic imaging, findings suggestive of  myocardial scar in the anterior, apical, inferoseptal and the  basilar septum were present.  There was no evidence for ischemia.  Other findings as noted.  Recent Labs: No results found for requested labs within last 365 days.  Recent Lipid Panel No results found for: "CHOL", "TRIG", "HDL", "CHOLHDL", "VLDL", "LDLCALC", "LDLDIRECT"   Risk Assessment/Calculations:    CHA2DS2-VASc Score = 4   This indicates a 4.8% annual risk of stroke. The patient's score is based upon: CHF History: 1 HTN History:  1 Diabetes History: 0 Stroke History: 0 Vascular Disease History: 0 Age Score: 2 Gender Score: 0    Physical Exam:    VS:  BP 132/80   Pulse 75   Ht 5\' 8"  (1.727 m)   Wt 213 lb 6.4 oz (96.8 kg)   SpO2 98%   BMI 32.45 kg/m     Wt Readings from Last 3 Encounters:  03/22/22 213 lb 6.4 oz (96.8 kg)  03/20/20 200 lb (  90.7 kg)  02/05/20 202 lb (91.6 kg)     GEN: Obese, 82 y.o. male in no acute distress HEENT: Normal NECK: No JVD; No carotid bruits CARDIAC: S1/S2, RRR, no murmurs, rubs, gallops; 2+ pulses RESPIRATORY:  Clear to auscultation without rales, wheezing or rhonchi  MUSCULOSKELETAL:  No edema; No deformity  SKIN: Warm and dry NEUROLOGIC:  Alert and oriented x 3 PSYCHIATRIC:  Normal affect   ASSESSMENT:    1. Coronary artery disease involving native heart without angina pectoris, unspecified vessel or lesion type   2. Heart failure with improved ejection fraction (HFimpEF) (HCC)   3. NICM (nonischemic cardiomyopathy) (HCC)   4. Essential hypertension, benign   5. Atrial fibrillation with rapid ventricular response (HCC) -status post cardioversion   6. S/P aortic valve replacement with metallic valve   7. ICD (implantable cardioverter-defibrillator) in place   8. Mixed hyperlipidemia    PLAN:    In order of problems listed above:  CAD Cardiac catheterization in 2002 revealed nonobstructive CAD. Stable with no anginal symptoms. No indication for ischemic evaluation.  Continue atorvastatin, losartan, and Toprol-XL. Heart healthy diet and regular cardiovascular exercise encouraged.   Chronic combined CHF >> HFimpEF, NICM History of NICM with recovered EF.  Echocardiogram in 2021 showed EF at 50 to 55%, aortic prosthetic valve was stable.  He is due for repeat imaging.  Will update echocardiogram at this time.  Euvolemic and well compensated on exam.  No changes in medication regimen. Low sodium diet, fluid restriction <2L, and daily weights encouraged. Educated to  contact our office for weight gain of 2 lbs overnight or 5 lbs in one week. Heart healthy diet and regular cardiovascular exercise encouraged.  Will refill metoprolol today.  HTN Blood pressure stable.  BP well-controlled at home. Discussed to monitor BP at home at least 2 hours after medications and sitting for 5-10 minutes.  Continue current medication regimen. Heart healthy diet and regular cardiovascular exercise encouraged.   A-fib, s/p DCCV, on long term anticoagulation Denies any tachycardia or palpitations.  Heart rate well-controlled today.  Continue Toprol-XL.  Continue to follow-up at Coumadin clinic, denies any bleeding issues. Heart healthy diet and regular cardiovascular exercise encouraged.   S/P aortic valve replacement with metallic valve Echocardiogram in 2021 showed stable aortic prosthetic valve function.  Will update echocardiogram at this time to evaluate function.  Continue to follow-up with Coumadin clinic.  S/P ICD placement Last remote device check on file is from 2022 with Dr. Johney Frame.  Device was functioning normally.  He states he needs to have this interrogated in office, and I agree.  We will set up first available appointment with Dr. Nelly Laurence here in [place] for device interrogation and to set up remote device checks.  Denies any issues currently with his device. Heart healthy diet and regular cardiovascular exercise encouraged.  ED precautions discussed.  7. Mixed hyperlipidemia  LDL December 2023 was 68.  He is currently at goal.  Continue atorvastatin. Heart healthy diet and regular cardiovascular exercise encouraged.   8.  Disposition: Follow-up with Dr. Diona Browner or me in 6 to 8 months or sooner if anything changes.   Medication Adjustments/Labs and Tests Ordered: Current medicines are reviewed at length with the patient today.  Concerns regarding medicines are outlined above.  Orders Placed This Encounter  Procedures   EKG 12-Lead   Meds ordered this  encounter  Medications   metoprolol succinate (TOPROL-XL) 100 MG 24 hr tablet    Sig: Take 1.5 tablets (  150 mg total) by mouth daily.    Dispense:  135 tablet    Refill:  3    Patient Instructions  Medication Instructions:  Continue all current medications.   Labwork: none  Testing/Procedures: Your physician has requested that you have an echocardiogram. Echocardiography is a painless test that uses sound waves to create images of your heart. It provides your doctor with information about the size and shape of your heart and how well your heart's chambers and valves are working. This procedure takes approximately one hour. There are no restrictions for this procedure. Please do NOT wear cologne, perfume, aftershave, or lotions (deodorant is allowed). Please arrive 15 minutes prior to your appointment time.  Office will contact with results via phone, letter or mychart.     Follow-Up: 6 - 8 months - Dr. Diona Browner  Next available in [place] - Dr. Nelly Laurence (device)   Any Other Special Instructions Will Be Listed Below (If Applicable).   If you need a refill on your cardiac medications before your next appointment, please call your pharmacy.    SignedSharlene Dory, NP  03/24/2022 10:43 AM    [facility]

## 2022-03-22 NOTE — Patient Instructions (Addendum)
Medication Instructions:  Continue all current medications.   Labwork: none  Testing/Procedures: Your physician has requested that you have an echocardiogram. Echocardiography is a painless test that uses sound waves to create images of your heart. It provides your doctor with information about the size and shape of your heart and how well your heart's chambers and valves are working. This procedure takes approximately one hour. There are no restrictions for this procedure. Please do NOT wear cologne, perfume, aftershave, or lotions (deodorant is allowed). Please arrive 15 minutes prior to your appointment time.  Office will contact with results via phone, letter or mychart.     Follow-Up: 6 - 8 months - Dr. Domenic Polite  Next available in [place] - Dr. Myles Gip (device)   Any Other Special Instructions Will Be Listed Below (If Applicable).   If you need a refill on your cardiac medications before your next appointment, please call your pharmacy.

## 2022-04-07 ENCOUNTER — Ambulatory Visit: Payer: Medicare HMO | Attending: Nurse Practitioner

## 2022-04-11 DIAGNOSIS — I10 Essential (primary) hypertension: Secondary | ICD-10-CM | POA: Diagnosis not present

## 2022-04-11 DIAGNOSIS — I4891 Unspecified atrial fibrillation: Secondary | ICD-10-CM | POA: Diagnosis not present

## 2022-04-11 DIAGNOSIS — Z299 Encounter for prophylactic measures, unspecified: Secondary | ICD-10-CM | POA: Diagnosis not present

## 2022-04-11 DIAGNOSIS — Z6832 Body mass index (BMI) 32.0-32.9, adult: Secondary | ICD-10-CM | POA: Diagnosis not present

## 2022-04-29 ENCOUNTER — Ambulatory Visit: Payer: Medicare HMO | Attending: Internal Medicine

## 2022-05-11 ENCOUNTER — Ambulatory Visit: Payer: Medicare HMO | Attending: Nurse Practitioner

## 2022-05-11 DIAGNOSIS — I359 Nonrheumatic aortic valve disorder, unspecified: Secondary | ICD-10-CM | POA: Diagnosis not present

## 2022-05-11 DIAGNOSIS — I4891 Unspecified atrial fibrillation: Secondary | ICD-10-CM | POA: Diagnosis not present

## 2022-05-12 LAB — ECHOCARDIOGRAM COMPLETE
AR max vel: 1.04 cm2
AV Area VTI: 1.18 cm2
AV Area mean vel: 1 cm2
AV Mean grad: 6.7 mmHg
AV Peak grad: 12.3 mmHg
Ao pk vel: 1.75 m/s
Area-P 1/2: 4.49 cm2
Calc EF: 57 %
Est EF: 55
MV M vel: 4.34 m/s
MV Peak grad: 75.5 mmHg
P 1/2 time: 975 msec
Radius: 0.3 cm
S' Lateral: 3 cm
Single Plane A2C EF: 56.9 %
Single Plane A4C EF: 56.3 %

## 2022-07-13 DIAGNOSIS — Z299 Encounter for prophylactic measures, unspecified: Secondary | ICD-10-CM | POA: Diagnosis not present

## 2022-07-13 DIAGNOSIS — I10 Essential (primary) hypertension: Secondary | ICD-10-CM | POA: Diagnosis not present

## 2022-07-13 DIAGNOSIS — I4891 Unspecified atrial fibrillation: Secondary | ICD-10-CM | POA: Diagnosis not present

## 2022-07-13 DIAGNOSIS — R42 Dizziness and giddiness: Secondary | ICD-10-CM | POA: Diagnosis not present

## 2022-07-13 DIAGNOSIS — I509 Heart failure, unspecified: Secondary | ICD-10-CM | POA: Diagnosis not present

## 2022-07-29 ENCOUNTER — Encounter: Payer: Self-pay | Admitting: Cardiovascular Disease

## 2022-07-29 ENCOUNTER — Ambulatory Visit: Payer: Medicare HMO | Attending: Cardiovascular Disease | Admitting: Cardiovascular Disease

## 2022-07-29 VITALS — BP 138/82 | HR 77 | Ht 68.0 in | Wt 210.0 lb

## 2022-07-29 DIAGNOSIS — I447 Left bundle-branch block, unspecified: Secondary | ICD-10-CM

## 2022-07-29 DIAGNOSIS — I4821 Permanent atrial fibrillation: Secondary | ICD-10-CM

## 2022-07-29 DIAGNOSIS — I428 Other cardiomyopathies: Secondary | ICD-10-CM | POA: Diagnosis not present

## 2022-07-29 LAB — CUP PACEART INCLINIC DEVICE CHECK
Battery Remaining Longevity: 55 mo
Brady Statistic RA Percent Paced: 0 %
Brady Statistic RV Percent Paced: 92 %
Date Time Interrogation Session: 20240614142639
HighPow Impedance: 69.75 Ohm
Implantable Lead Connection Status: 753985
Implantable Lead Connection Status: 753985
Implantable Lead Connection Status: 753985
Implantable Lead Implant Date: 20140529
Implantable Lead Implant Date: 20140529
Implantable Lead Implant Date: 20140529
Implantable Lead Location: 753858
Implantable Lead Location: 753859
Implantable Lead Location: 753860
Implantable Pulse Generator Implant Date: 20211019
Lead Channel Impedance Value: 375 Ohm
Lead Channel Impedance Value: 387.5 Ohm
Lead Channel Impedance Value: 637.5 Ohm
Lead Channel Pacing Threshold Amplitude: 1 V
Lead Channel Pacing Threshold Amplitude: 1 V
Lead Channel Pacing Threshold Amplitude: 1 V
Lead Channel Pacing Threshold Amplitude: 1 V
Lead Channel Pacing Threshold Pulse Width: 0.5 ms
Lead Channel Pacing Threshold Pulse Width: 0.5 ms
Lead Channel Pacing Threshold Pulse Width: 0.5 ms
Lead Channel Pacing Threshold Pulse Width: 0.5 ms
Lead Channel Sensing Intrinsic Amplitude: 1.6 mV
Lead Channel Sensing Intrinsic Amplitude: 12 mV
Lead Channel Setting Pacing Amplitude: 2 V
Lead Channel Setting Pacing Amplitude: 2.5 V
Lead Channel Setting Pacing Pulse Width: 0.5 ms
Lead Channel Setting Pacing Pulse Width: 0.5 ms
Lead Channel Setting Sensing Sensitivity: 0.5 mV
Pulse Gen Serial Number: 810006284
Zone Setting Status: 755011

## 2022-07-29 NOTE — Patient Instructions (Signed)
Medication Instructions:   Continue all current medications.   Labwork:  none  Testing/Procedures:  none  Follow-Up:  Device check only July 5th   Any Other Special Instructions Will Be Listed Below (If Applicable).   If you need a refill on your cardiac medications before your next appointment, please call your pharmacy.

## 2022-07-29 NOTE — Progress Notes (Signed)
PCP: [name], MD   Primary EP: Dr Nelly Laurence  Timothy May Sr. is a 82 y.o. male who presents today for routine electrophysiology followup.  Since last being seen in our clinic, the patient reports doing very well.        Today, he denies symptoms of palpitations, chest pain, shortness of breath,  lower extremity edema, dizziness, presyncope, syncope, or ICD shocks.  he has no device related complaints -- no new tenderness, drainage, redness.The patient is otherwise without complaint today.   Past Medical History:  Diagnosis Date   AICD (automatic cardioverter/defibrillator) present 07/11/2012   Aortic aneurysm, thoracic (HCC)    Fusiform s/p Bentall procedure 2002   Aortic valve, bicuspid    Mechanical AVR   Basal cell carcinoma of face    BPH (benign prostatic hypertrophy)    Coronary atherosclerosis of native coronary artery    Nonobstructive at cardiac catherization 2002   Essential hypertension    Left bundle branch block    Mixed hyperlipidemia    Nonischemic cardiomyopathy (HCC)    CRT-D   Persistent atrial fibrillation [facility])    Past Surgical History:  Procedure Laterality Date   BENTALL PROCEDURE  2002   Dr Laneta Simmers    BI-VENTRICULAR IMPLANTABLE CARDIOVERTER DEFIBRILLATOR N/A 07/12/2012   Procedure: BI-VENTRICULAR IMPLANTABLE CARDIOVERTER DEFIBRILLATOR  (CRT-D);  Surgeon: Hillis Range, MD;  Location: [facility] CATH LAB;  Service: Cardiovascular;  Laterality: N/A;   BI-VENTRICULAR IMPLANTABLE CARDIOVERTER DEFIBRILLATOR  (CRT-D)  07/11/2012   [facility]- Dr. Johney Frame   BIV ICD GENERATOR CHANGEOUT N/A 12/03/2019   Procedure: BIV ICD GENERATOR CHANGEOUT;  Surgeon: Hillis Range, MD;  Location: [facility] INVASIVE CV LAB;  Service: Cardiovascular;  Laterality: N/A;   CARDIAC CATHETERIZATION  2002   CARDIAC VALVE REPLACEMENT     AVR   CATARACT EXTRACTION Left 01/21/13   COLONOSCOPY N/A 12/01/2016   Procedure: COLONOSCOPY;  Surgeon: Malissa Hippo, MD;   [facility];  Service: Endoscopy;  Laterality: N/A;  2:00   COLONOSCOPY WITH PROPOFOL N/A 11/08/2017   Procedure: COLONOSCOPY WITH PROPOFOL;  Surgeon: Kerin Salen, MD;  Location: [facility] ENDOSCOPY;  Service: Gastroenterology;  Laterality: N/A;   ESOPHAGOGASTRODUODENOSCOPY (EGD) WITH PROPOFOL N/A 11/08/2017   Procedure: ESOPHAGOGASTRODUODENOSCOPY (EGD) WITH PROPOFOL Needs INR<2;  Surgeon: Kerin Salen, MD;  Location: [facility] ENDOSCOPY;  Service: Gastroenterology;  Laterality: N/A;   HERNIA REPAIR     IR ANGIOGRAM SELECTIVE EACH ADDITIONAL VESSEL  11/10/2017   IR ANGIOGRAM VISCERAL SELECTIVE  11/10/2017   IR ANGIOGRAM VISCERAL SELECTIVE  11/10/2017   IR ANGIOGRAM VISCERAL SELECTIVE  11/10/2017   IR EMBO ART  VEN HEMORR LYMPH EXTRAV  INC GUIDE ROADMAPPING  11/10/2017   IR US GUIDE VASC ACCESS RIGHT  11/10/2017   POLYPECTOMY  11/08/2017   Procedure: POLYPECTOMY;  Surgeon: Kerin Salen, MD;  Location: [facility] ENDOSCOPY;  Service: Gastroenterology;;   TONSILLECTOMY AND ADENOIDECTOMY  1949   UMBILICAL HERNIA REPAIR  09/2017    ROS- all systems are reviewed and negative except as per HPI above  Current Outpatient Medications  Medication Sig Dispense Refill   acetaminophen (TYLENOL) 500 MG tablet Take 1,000 mg by mouth every 6 (six) hours as needed for headache (pain).     atorvastatin (LIPITOR) 10 MG tablet Take 10 mg by mouth daily in the afternoon.      donepezil (ARICEPT) 5 MG tablet Take 5 mg by mouth daily in the afternoon.      escitalopram (LEXAPRO) 5 MG tablet  Take 5 mg by mouth daily in the afternoon.     finasteride (PROSCAR) 5 MG tablet Take 5 mg by mouth daily in the afternoon.      furosemide (LASIX) 20 MG tablet Take 20 mg by mouth daily as needed for fluid or edema.      losartan (COZAAR) 25 MG tablet TAKE 1/2 TABLET BY MOUTH AT BEDTIME (Cut tabs in half) 7 tablet 0   meclizine (ANTIVERT) 25 MG tablet Take 25 mg by mouth 3 (three) times daily as needed for dizziness.     melatonin 3 MG TABS  tablet Take by mouth.     metoprolol succinate (TOPROL-XL) 100 MG 24 hr tablet Take 1.5 tablets (150 mg total) by mouth daily. 135 tablet 3   Multiple Vitamin (MULTIVITAMIN WITH MINERALS) TABS tablet Take 1 tablet by mouth daily.     oxymetazoline (NASAL RELIEF) 0.05 % nasal spray 1 spray daily as needed for congestion.     pantoprazole (PROTONIX) 40 MG tablet Take 40 mg by mouth daily in the afternoon.      tamsulosin (FLOMAX) 0.4 MG CAPS capsule Take 0.4 mg by mouth at bedtime.     warfarin (COUMADIN) 5 MG tablet Take 5 mg by mouth daily at 6 PM.      Omega-3 Fatty Acids (FISH OIL) 1000 MG CAPS Take 1,000 mg by mouth every evening. (Patient not taking: Reported on 03/22/2022)     No current facility-administered medications for this visit.    Physical Exam: Vitals:   07/29/22 1340  BP: 138/82  Pulse: 77  SpO2: 94%  Weight: 210 lb (95.3 kg)  Height: 5\' 8"  (1.727 m)    GEN- The patient is well appearing, alert and oriented x 3 today.   Head- normocephalic, atraumatic Eyes-  Sclera clear, conjunctiva pink Ears- hearing intact Oropharynx- clear Lungs-   normal work of breathing Chest- ICD pocket is well healed Heart- Regular rate and rhythm (V paced) GI- soft  Extremities- no clubbing, cyanosis, or edema  ICD interrogation- reviewed in detail today,  See PACEART report   Wt Readings from Last 3 Encounters:  07/29/22 210 lb (95.3 kg)  03/22/22 213 lb 6.4 oz (96.8 kg)  03/20/20 200 lb (90.7 kg)    Assessment and Plan:  1.  Chronic systolic dysfunction/ nonischemic CM, recovered EF euvolemic today TTE 04/2022 reviewed - EF 55% Stable on an appropriate medical regimen Normal ICD function See [facility] report No changes today he is not device dependant today followed in ICM device clinic  2. Permanent afib Rate controlled 92 % BiV paced Not a candidate for PVI Could consider AV nodal ablation  3. Mechanical AVR/ Bentall procedure On coumadin  4. HTN Stable No  change required today  5. NSVT Ventricular high rate episodes occurred, all non-sustained.  5. LBBB 92% BiV paced EF is normal  Risks, benefits and potential toxicities for medications prescribed and/or refilled reviewed with patient today.   Return in a year  Maurice Small, MD 07/29/2022 1:49 PM

## 2022-08-10 DIAGNOSIS — Z299 Encounter for prophylactic measures, unspecified: Secondary | ICD-10-CM | POA: Diagnosis not present

## 2022-08-10 DIAGNOSIS — I10 Essential (primary) hypertension: Secondary | ICD-10-CM | POA: Diagnosis not present

## 2022-08-10 DIAGNOSIS — I4891 Unspecified atrial fibrillation: Secondary | ICD-10-CM | POA: Diagnosis not present

## 2022-08-10 DIAGNOSIS — R42 Dizziness and giddiness: Secondary | ICD-10-CM | POA: Diagnosis not present

## 2022-08-19 ENCOUNTER — Ambulatory Visit: Payer: Medicare HMO | Attending: Cardiovascular Disease | Admitting: Cardiovascular Disease

## 2022-08-19 ENCOUNTER — Telehealth: Payer: Self-pay

## 2022-08-19 NOTE — Telephone Encounter (Signed)
Called patient to inquire if they were coming for their 1:00 PM visit to pair remote monitor.   No answer, LMTCB to device clinic at [phone].  Will leave phone plugged in and on in the device room in [place] office so patient can come pick up when they are able to do so. Also, left the phone number to abbott on the box for patient to call and pair device. Bryan from [place]. [facility] is also aware. Will also let front desk staff know.

## 2022-08-22 ENCOUNTER — Encounter: Payer: Self-pay | Admitting: Cardiovascular Disease

## 2022-09-09 DIAGNOSIS — I10 Essential (primary) hypertension: Secondary | ICD-10-CM | POA: Diagnosis not present

## 2022-09-09 DIAGNOSIS — I509 Heart failure, unspecified: Secondary | ICD-10-CM | POA: Diagnosis not present

## 2022-09-09 DIAGNOSIS — Z7189 Other specified counseling: Secondary | ICD-10-CM | POA: Diagnosis not present

## 2022-09-09 DIAGNOSIS — G309 Alzheimer's disease, unspecified: Secondary | ICD-10-CM | POA: Diagnosis not present

## 2022-09-09 DIAGNOSIS — Z1331 Encounter for screening for depression: Secondary | ICD-10-CM | POA: Diagnosis not present

## 2022-09-09 DIAGNOSIS — Z299 Encounter for prophylactic measures, unspecified: Secondary | ICD-10-CM | POA: Diagnosis not present

## 2022-09-09 DIAGNOSIS — I4891 Unspecified atrial fibrillation: Secondary | ICD-10-CM | POA: Diagnosis not present

## 2022-09-09 DIAGNOSIS — Z0000 Encounter for general adult medical examination without abnormal findings: Secondary | ICD-10-CM | POA: Diagnosis not present

## 2022-09-09 DIAGNOSIS — Z1339 Encounter for screening examination for other mental health and behavioral disorders: Secondary | ICD-10-CM | POA: Diagnosis not present

## 2022-09-16 ENCOUNTER — Encounter: Payer: Medicare PPO | Admitting: Cardiovascular Disease

## 2022-10-10 DIAGNOSIS — I509 Heart failure, unspecified: Secondary | ICD-10-CM | POA: Diagnosis not present

## 2022-10-10 DIAGNOSIS — I4891 Unspecified atrial fibrillation: Secondary | ICD-10-CM | POA: Diagnosis not present

## 2022-10-10 DIAGNOSIS — I10 Essential (primary) hypertension: Secondary | ICD-10-CM | POA: Diagnosis not present

## 2022-10-10 DIAGNOSIS — R42 Dizziness and giddiness: Secondary | ICD-10-CM | POA: Diagnosis not present

## 2022-10-10 DIAGNOSIS — Z299 Encounter for prophylactic measures, unspecified: Secondary | ICD-10-CM | POA: Diagnosis not present

## 2022-10-24 ENCOUNTER — Other Ambulatory Visit (HOSPITAL_BASED_OUTPATIENT_CLINIC_OR_DEPARTMENT_OTHER): Payer: Self-pay | Admitting: *Deleted

## 2022-10-24 MED ORDER — METOPROLOL SUCCINATE ER 100 MG PO TB24: 150.0000 mg | ORAL_TABLET | Freq: Every day | ORAL | 2 refills | Status: DC

## 2022-11-08 ENCOUNTER — Ambulatory Visit: Payer: Medicare PPO | Admitting: Cardiology

## 2022-11-08 NOTE — Progress Notes (Deleted)
Cardiology Office Note  Date: 11/08/2022   ID: Timothy May Sr., DOB 1940-06-10, MRN [id number]  History of Present Illness: Timothy Fritz. is an 82 y.o. male last seen in February by Ms. Peck NP, I reviewed the note (our last visit was in 2021).  Abbott Gallant CRT-D in place with follow-up by Dr. Nelly Laurence.  Last device check indicated 92% biventricular pacing.  He remains on Coumadin with follow-up by PCP.  Physical Exam: VS:  There were no vitals taken for this visit., BMI There is no height or weight on file to calculate BMI.  Wt Readings from Last 3 Encounters:  07/29/22 210 lb (95.3 kg)  03/22/22 213 lb 6.4 oz (96.8 kg)  03/20/20 200 lb (90.7 kg)    General: Patient appears comfortable at rest. HEENT: Conjunctiva and lids normal, oropharynx clear with moist mucosa. Neck: Supple, no elevated JVP or carotid bruits, no thyromegaly. Lungs: Clear to auscultation, nonlabored breathing at rest. Cardiac: Regular rate and rhythm, no S3 or significant systolic murmur, no pericardial rub. Abdomen: Soft, nontender, no hepatomegaly, bowel sounds present, no guarding or rebound. Extremities: No pitting edema, distal pulses 2+. Skin: Warm and dry. Musculoskeletal: No kyphosis. Neuropsychiatric: Alert and oriented x3, affect grossly appropriate.  ECG:  An ECG dated 03/22/2022 was personally reviewed today and demonstrated:  Regular pacing.  Labwork:  December 2023: Hemoglobin 14.4, platelets 152, BUN 17, creatinine 1.17, potassium 4.8, AST 23, ALT 13, cholesterol 130, glycerides 113, HDL 41, LDL 68, TSH 2.66  Other Studies Reviewed Today:  Echocardiogram 05/11/2022:  1. Left ventricular ejection fraction, by estimation, is 55%. The left  ventricle has normal function. The left ventricle has no regional wall  motion abnormalities. Left ventricular diastolic function could not be  evaluated. The average left ventricular  global longitudinal strain is -21.1 %. The global  longitudinal strain is  normal.   2. Right ventricular systolic function is mildly reduced. The right  ventricular size is normal. There is normal pulmonary artery systolic  pressure.   3. Right atrial size was moderately dilated.   4. The mitral valve is normal in structure. Mild mitral valve  regurgitation. No evidence of mitral stenosis.   5. The aortic valve has been repaired/replaced. Aortic valve  regurgitation is trivial. No aortic stenosis is present. There is a  mechanical valve present in the aortic position. Procedure Date: 2002.   6. The inferior vena cava is normal in size with greater than 50%  respiratory variability, suggesting right atrial pressure of 3 mmHg.   7. S/p Bentall procedure. Aortic root/ascending aorta has been  repaired/replaced.   Assessment and Plan:  1.  HFrecEF with history of nonischemic cardiomyopathy, LVEF 55% by follow-up echocardiogram in March.   2.  History of bicuspid aortic valve with aortic aneurysm status post Bentall procedure and 25 mm St[employer] Jude mechanical AVR in 2002.  Follow-up echocardiogram in March revealed mean AV gradient approximately 7 mmHg and trivial aortic regurgitation.  He remains on Coumadin with follow-up by PCP.   3.  Permanent atrial fibrillation/flutter.  CHA2DS2-VASc score is 4.  He continues on Coumadin with follow-up by PCP.   4.  Abbott Gallant CRT-D in place with follow-up by Dr. Nelly Laurence.  Disposition:  Follow up {follow up:15908}  Signed, [name], M.D., F.A.C.C. [facility]

## 2022-11-11 DIAGNOSIS — I4891 Unspecified atrial fibrillation: Secondary | ICD-10-CM | POA: Diagnosis not present

## 2022-11-11 DIAGNOSIS — Z23 Encounter for immunization: Secondary | ICD-10-CM | POA: Diagnosis not present

## 2022-11-11 DIAGNOSIS — I10 Essential (primary) hypertension: Secondary | ICD-10-CM | POA: Diagnosis not present

## 2022-11-11 DIAGNOSIS — I509 Heart failure, unspecified: Secondary | ICD-10-CM | POA: Diagnosis not present

## 2022-11-11 DIAGNOSIS — Z299 Encounter for prophylactic measures, unspecified: Secondary | ICD-10-CM | POA: Diagnosis not present

## 2022-12-12 DIAGNOSIS — Z0000 Encounter for general adult medical examination without abnormal findings: Secondary | ICD-10-CM | POA: Diagnosis not present

## 2022-12-12 DIAGNOSIS — I5022 Chronic systolic (congestive) heart failure: Secondary | ICD-10-CM | POA: Diagnosis not present

## 2022-12-12 DIAGNOSIS — I4891 Unspecified atrial fibrillation: Secondary | ICD-10-CM | POA: Diagnosis not present

## 2022-12-12 DIAGNOSIS — Z299 Encounter for prophylactic measures, unspecified: Secondary | ICD-10-CM | POA: Diagnosis not present

## 2022-12-12 DIAGNOSIS — G309 Alzheimer's disease, unspecified: Secondary | ICD-10-CM | POA: Diagnosis not present

## 2022-12-12 DIAGNOSIS — I10 Essential (primary) hypertension: Secondary | ICD-10-CM | POA: Diagnosis not present

## 2022-12-18 ENCOUNTER — Other Ambulatory Visit: Payer: Self-pay | Admitting: Nurse Practitioner

## 2023-01-19 DIAGNOSIS — I4891 Unspecified atrial fibrillation: Secondary | ICD-10-CM | POA: Diagnosis not present

## 2023-01-19 DIAGNOSIS — G309 Alzheimer's disease, unspecified: Secondary | ICD-10-CM | POA: Diagnosis not present

## 2023-01-19 DIAGNOSIS — I5022 Chronic systolic (congestive) heart failure: Secondary | ICD-10-CM | POA: Diagnosis not present

## 2023-01-19 DIAGNOSIS — I10 Essential (primary) hypertension: Secondary | ICD-10-CM | POA: Diagnosis not present

## 2023-01-19 DIAGNOSIS — F0280 Dementia in other diseases classified elsewhere without behavioral disturbance: Secondary | ICD-10-CM | POA: Diagnosis not present

## 2023-01-19 DIAGNOSIS — Z299 Encounter for prophylactic measures, unspecified: Secondary | ICD-10-CM | POA: Diagnosis not present

## 2023-02-20 DIAGNOSIS — Z299 Encounter for prophylactic measures, unspecified: Secondary | ICD-10-CM | POA: Diagnosis not present

## 2023-02-20 DIAGNOSIS — I10 Essential (primary) hypertension: Secondary | ICD-10-CM | POA: Diagnosis not present

## 2023-02-20 DIAGNOSIS — I5022 Chronic systolic (congestive) heart failure: Secondary | ICD-10-CM | POA: Diagnosis not present

## 2023-02-20 DIAGNOSIS — I4891 Unspecified atrial fibrillation: Secondary | ICD-10-CM | POA: Diagnosis not present

## 2023-02-21 ENCOUNTER — Ambulatory Visit: Payer: Medicare HMO | Attending: Cardiology | Admitting: Cardiology

## 2023-04-24 DIAGNOSIS — I10 Essential (primary) hypertension: Secondary | ICD-10-CM | POA: Diagnosis not present

## 2023-04-24 DIAGNOSIS — I4891 Unspecified atrial fibrillation: Secondary | ICD-10-CM | POA: Diagnosis not present

## 2023-04-24 DIAGNOSIS — I5022 Chronic systolic (congestive) heart failure: Secondary | ICD-10-CM | POA: Diagnosis not present

## 2023-04-24 DIAGNOSIS — Z299 Encounter for prophylactic measures, unspecified: Secondary | ICD-10-CM | POA: Diagnosis not present

## 2023-09-15 ENCOUNTER — Other Ambulatory Visit: Payer: Self-pay | Admitting: Cardiovascular Disease

## 2023-09-20 DIAGNOSIS — I4891 Unspecified atrial fibrillation: Secondary | ICD-10-CM | POA: Diagnosis not present

## 2023-09-20 DIAGNOSIS — Z299 Encounter for prophylactic measures, unspecified: Secondary | ICD-10-CM | POA: Diagnosis not present

## 2023-09-20 DIAGNOSIS — G309 Alzheimer's disease, unspecified: Secondary | ICD-10-CM | POA: Diagnosis not present

## 2023-09-20 DIAGNOSIS — R058 Other specified cough: Secondary | ICD-10-CM | POA: Diagnosis not present

## 2023-09-20 DIAGNOSIS — I5022 Chronic systolic (congestive) heart failure: Secondary | ICD-10-CM | POA: Diagnosis not present

## 2023-10-02 DIAGNOSIS — I4891 Unspecified atrial fibrillation: Secondary | ICD-10-CM | POA: Diagnosis not present

## 2023-10-02 DIAGNOSIS — G309 Alzheimer's disease, unspecified: Secondary | ICD-10-CM | POA: Diagnosis not present

## 2023-10-02 DIAGNOSIS — I10 Essential (primary) hypertension: Secondary | ICD-10-CM | POA: Diagnosis not present

## 2023-10-02 DIAGNOSIS — Z299 Encounter for prophylactic measures, unspecified: Secondary | ICD-10-CM | POA: Diagnosis not present

## 2023-10-02 DIAGNOSIS — I5022 Chronic systolic (congestive) heart failure: Secondary | ICD-10-CM | POA: Diagnosis not present

## 2023-10-20 ENCOUNTER — Encounter: Payer: Self-pay | Admitting: Cardiovascular Disease

## 2023-10-20 ENCOUNTER — Ambulatory Visit: Payer: Medicare HMO | Attending: Cardiovascular Disease | Admitting: Cardiovascular Disease

## 2023-10-26 DIAGNOSIS — I5022 Chronic systolic (congestive) heart failure: Secondary | ICD-10-CM | POA: Diagnosis not present

## 2023-10-26 DIAGNOSIS — I4891 Unspecified atrial fibrillation: Secondary | ICD-10-CM | POA: Diagnosis not present

## 2023-10-26 DIAGNOSIS — Z299 Encounter for prophylactic measures, unspecified: Secondary | ICD-10-CM | POA: Diagnosis not present

## 2023-10-26 DIAGNOSIS — I10 Essential (primary) hypertension: Secondary | ICD-10-CM | POA: Diagnosis not present

## 2023-11-29 DIAGNOSIS — I509 Heart failure, unspecified: Secondary | ICD-10-CM | POA: Diagnosis not present

## 2023-11-29 DIAGNOSIS — I4891 Unspecified atrial fibrillation: Secondary | ICD-10-CM | POA: Diagnosis not present

## 2023-11-29 DIAGNOSIS — G309 Alzheimer's disease, unspecified: Secondary | ICD-10-CM | POA: Diagnosis not present

## 2023-11-29 DIAGNOSIS — I10 Essential (primary) hypertension: Secondary | ICD-10-CM | POA: Diagnosis not present

## 2023-11-29 DIAGNOSIS — Z299 Encounter for prophylactic measures, unspecified: Secondary | ICD-10-CM | POA: Diagnosis not present

## 2023-12-25 ENCOUNTER — Other Ambulatory Visit: Payer: Self-pay | Admitting: Cardiovascular Disease

## 2023-12-27 ENCOUNTER — Other Ambulatory Visit: Payer: Self-pay | Admitting: Cardiovascular Disease

## 2023-12-29 MED ORDER — METOPROLOL SUCCINATE ER 100 MG PO TB24
150.0000 mg | ORAL_TABLET | Freq: Every day | ORAL | 0 refills | Status: AC
Start: 2023-12-29 — End: ?
